# Patient Record
Sex: Female | Born: 1950 | Race: Black or African American | Hispanic: No | Smoking: Never smoker
Health system: Southern US, Community
[De-identification: ages and names within clinical notes are randomized; demographics above are authoritative.]

## PROBLEM LIST (undated history)

## (undated) ENCOUNTER — Emergency Department (HOSPITAL_COMMUNITY): Admission: EM | Payer: Medicare PPO | Source: Home / Self Care

## (undated) ENCOUNTER — Emergency Department (HOSPITAL_BASED_OUTPATIENT_CLINIC_OR_DEPARTMENT_OTHER): Discharge status: Absent without leave | Payer: Medicare PPO

## (undated) DIAGNOSIS — Z923 Personal history of irradiation: Secondary | ICD-10-CM

## (undated) DIAGNOSIS — D649 Anemia, unspecified: Secondary | ICD-10-CM

## (undated) HISTORY — PX: ABDOMINAL HYSTERECTOMY: SHX81

## (undated) HISTORY — PX: WISDOM TOOTH EXTRACTION: SHX21

## (undated) HISTORY — DX: Personal history of irradiation: Z92.3

---

## 2021-01-07 ENCOUNTER — Ambulatory Visit
Admission: RE | Admit: 2021-01-07 | Discharge: 2021-01-07 | Disposition: A | Discharge status: Home / Self Care | Payer: Self-pay | Source: Ambulatory Visit | Attending: Gynecologic Oncology | Admitting: Gynecologic Oncology

## 2021-01-07 ENCOUNTER — Other Ambulatory Visit: Payer: Self-pay

## 2021-01-07 ENCOUNTER — Telehealth: Payer: Self-pay | Admitting: *Deleted

## 2021-01-07 DIAGNOSIS — R1084 Generalized abdominal pain: Secondary | ICD-10-CM

## 2021-01-07 NOTE — Telephone Encounter (Signed)
Called and left the patient a message to call the office back. Patient needs to be scheduled for a new patient appt  °

## 2021-01-08 NOTE — Telephone Encounter (Signed)
Attempted to reach the patient to schedule a new patient appt. Left the patient a message to call the office back.

## 2021-01-08 NOTE — Telephone Encounter (Signed)
Patient called the office back and schedule new patient appt for 7/7 at 10:30 am with Dr Denman George. Patient given an arrive time of 10 am. Send patient an email with appt information

## 2021-01-13 ENCOUNTER — Encounter: Payer: Self-pay | Admitting: Gynecologic Oncology

## 2021-01-14 ENCOUNTER — Other Ambulatory Visit: Payer: Self-pay

## 2021-01-14 ENCOUNTER — Inpatient Hospital Stay (HOSPITAL_BASED_OUTPATIENT_CLINIC_OR_DEPARTMENT_OTHER): Payer: Medicare Other | Admitting: Gynecologic Oncology

## 2021-01-14 ENCOUNTER — Inpatient Hospital Stay: Payer: Medicare Other | Attending: Gynecologic Oncology | Admitting: Gynecologic Oncology

## 2021-01-14 ENCOUNTER — Encounter: Payer: Self-pay | Admitting: Gynecologic Oncology

## 2021-01-14 VITALS — BP 119/62 | HR 76 | Temp 98.8°F | Resp 20 | Ht 62.0 in | Wt 137.0 lb

## 2021-01-14 DIAGNOSIS — K862 Cyst of pancreas: Secondary | ICD-10-CM

## 2021-01-14 DIAGNOSIS — N950 Postmenopausal bleeding: Secondary | ICD-10-CM

## 2021-01-14 DIAGNOSIS — D259 Leiomyoma of uterus, unspecified: Secondary | ICD-10-CM | POA: Insufficient documentation

## 2021-01-14 DIAGNOSIS — D251 Intramural leiomyoma of uterus: Secondary | ICD-10-CM

## 2021-01-14 DIAGNOSIS — K8689 Other specified diseases of pancreas: Secondary | ICD-10-CM

## 2021-01-14 DIAGNOSIS — D390 Neoplasm of uncertain behavior of uterus: Secondary | ICD-10-CM

## 2021-01-14 DIAGNOSIS — N858 Other specified noninflammatory disorders of uterus: Secondary | ICD-10-CM

## 2021-01-14 MED ORDER — SENNOSIDES-DOCUSATE SODIUM 8.6-50 MG PO TABS: 2.0000 | ORAL_TABLET | Freq: Every day | ORAL | 0 refills | Status: DC

## 2021-01-14 MED ORDER — TRAMADOL HCL 50 MG PO TABS: 50.0000 mg | ORAL_TABLET | Freq: Four times a day (QID) | ORAL | 0 refills | Status: DC | PRN

## 2021-01-14 MED ORDER — IBUPROFEN 600 MG PO TABS
600.0000 mg | ORAL_TABLET | Freq: Four times a day (QID) | ORAL | 0 refills | Status: DC | PRN
Start: 2021-01-14 — End: 2021-03-05

## 2021-01-14 NOTE — Progress Notes (Signed)
Consult Note: Gyn-Onc  Consult was requested by Dr. Evie Lacks for the evaluation of Candice Parks 70 y.o. female  CC:  Chief Complaint  Patient presents with   Uterine mass    Assessment/Plan:  Ms. Candice Parks  is a 70 y.o.  year old with clinical stage II endometrial cancer.   We will follow-up today's biopsy. If malignancy, recommend TAH, BSO, lymphadenectomy. There is no apparent cervical stromal/parametrial extension, though the posterior fibroids seem incarcerated and this will likely be a very challenging hysterectomy with anticipated high EBL. She will be type and crossed for 2 units of PRBC.  I explained risks of the procedure to the patient including  bleeding, infection, damage to internal organs (such as bladder,ureters, bowels), blood clot, reoperation and rehospitalization. I explained that she will be in hospital 2 nights postoperatively. I explained that she will need Lovenox postop for DVT prophylaxis.   She is retired and does not require time off of work.  I explained that for locally advanced endometrial cancer, surgery is rarely enough and she will likely require postoperative adjuvant therapies such as radiation, and possibly chemotherapy.   HPI: Ms Candice Parks is a 70 year old P0 who was seen in consultation at the request of Dr Evie Lacks for evaluation of a fibroid uterus with thickened endometrium and postmenopausal bleeding.  The patient reported at least 1 year of postmenopausal bleeding.  She does not have a primary care provider or gynecologist.  She had known fibroids and had noted that she needed a hysterectomy but had not gotten around to seeking care for this.  She presented to the [facility] emergency department on January 05, 2021 with new onset right lower quadrant pain and this prompted a CT scan of the abdomen and pelvis to be performed on 01/05/2021.  This revealed bilateral hydronephrosis related to extrinsic compression on the ureters by an enlarged  uterus.  There were multiple calcified fibroid seen and decreased attenuation within the uterus highly suspicious for endometrial cancer.  There is a hypodensity in the pancreas at the junction of the head and body with a recommendation for MRI for further evaluation.  The uterus measured 19 x 11 x 12 cm.  No lymphadenopathy was identified.  No carcinomatosis was seen.  Dr. Evie Lacks was contacted with these findings and the recommendation was made to consult GYN oncology.  Her medical history is most significant for an absence of formalized medical care.  She denied carrying medical diagnoses such as hypertension or diabetes.  Her surgical history is most significant for having had no prior surgeries.  Her gynecologic history is remarkable for being a P0 by choice.  Her family cancer history is unremarkable for malignancies.  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 She lives alone.   Current Meds:  Outpatient Encounter Medications as of 01/14/2021  Medication Sig   ferrous sulfate 325 (65 FE) MG tablet Take 1 tablet by mouth daily.   ibuprofen (ADVIL) 200 MG tablet Take 200 mg by mouth every 6 (six) hours as needed (Takes 2 tablets as needed).   magnesium hydroxide (MILK OF MAGNESIA) 400 MG/5ML suspension Take by mouth daily as needed for mild constipation.   No facility-administered encounter medications on file as of 01/14/2021.    Allergy: Not on File  Social Hx:   Social History   Socioeconomic History   Marital status: Divorced    Spouse name: Not on file   Number of children: Not  on file   Years of education: Not on file   Highest education level: Not on file  Occupational History   Not on file  Tobacco Use   Smoking status: Never   Smokeless tobacco: Never  Vaping Use   Vaping Use: Never used  Substance and Sexual Activity   Alcohol use: Yes    Comment: Occasional   Drug use: Never   Sexual activity: Not Currently  Other Topics Concern   Not  on file  Social History Narrative   Not on file   Social Determinants of Health   Financial Resource Strain: Not on file  Food Insecurity: Not on file  Transportation Needs: Not on file  Physical Activity: Not on file  Stress: Not on file  Social Connections: Not on file  Intimate Partner Violence: Not on file    Past Surgical Hx: History reviewed. No pertinent surgical history.  Past Medical Hx: History reviewed. No pertinent past medical history.  Past Gynecological History: See HPI, known uterine fibroids No LMP recorded.  Family Hx:  Family History  Problem Relation Age of Onset   Ovarian cancer Sister    Colon cancer Neg Hx    Breast cancer Neg Hx    Endometrial cancer Neg Hx    Pancreatic cancer Neg Hx    Prostate cancer Neg Hx     Review of Systems:  Constitutional  Feels well,    ENT Normal appearing ears and nares bilaterally Skin/Breast  No rash, sores, jaundice, itching, dryness Cardiovascular  No chest pain, shortness of breath, or edema  Pulmonary  No cough or wheeze.  Gastro Intestinal  No nausea, vomitting, or diarrhoea. No bright red blood per rectum, no abdominal pain, change in bowel movement, or constipation.  Genito Urinary  No frequency, urgency, dysuria, + postmenopausal bleeding, fibroid bulk symptoms Musculo Skeletal  No myalgia, arthralgia, joint swelling or pain  Neurologic  No weakness, numbness, change in gait,  Psychology  No depression, anxiety, insomnia.   Vitals:  Blood pressure 119/62, pulse 76, temperature 98.8 F (37.1 C), temperature source Tympanic, resp. rate 20, height 5\' 2"  (1.575 m), weight 137 lb (62.1 kg), SpO2 100 %.  Physical Exam: WD in NAD Neck  Supple NROM, without any enlargements.  Lymph Node Survey No cervical supraclavicular or inguinal adenopathy Cardiovascular  Pulse normal rate, regularity and rhythm. S1 and S2 normal.  Lungs  Clear to auscultation bilateraly, without wheezes/crackles/rhonchi.  Good air movement.  Skin  No rash/lesions/breakdown  Psychiatry  Alert and oriented to person, place, and time  Abdomen  Normoactive bowel sounds, abdomen soft, non-tender and nonobese (BMI 25) without evidence of hernia. Fibroid uterus palpable in lower abdomen extending to umbilicus. Nontender, minimally mobile. Back No CVA tenderness Genito Urinary  Vulva/vagina: Normal external female genitalia.  No lesions. No discharge or bleeding.  Bladder/urethra:  No lesions or masses, well supported bladder  Vagina: grossly normal and free of lesions  Cervix: Displaced anteriorally and pushed towards pelvic floor, the cervical os was slightly open with some friable papillary tissue coming from the endocervix which was sampled see below.  The cervix was palpably normal in its consistency and not obviously replaced by tumor.  There was no palpable parametrial involvement  Uterus: Bulky large immobile uterus filling posterior cul-de-sac with a calcified fibroid in mobile.  The uterus extended to at least the level of the umbilicus.  It was extensively involving sidewall to sidewall space in the pelvis.    Adnexa: no palpable  discrete masses other than uterus. Rectal  Good tone, no masses no cul de sac nodularity. Hemorrhoids. Large calcified fibroid uterus present. Extremities  No bilateral cyanosis, clubbing or edema.  60 minutes of total time was spent for this patient encounter, including preparation, face-to-face counseling with the patient and coordination of care, review of imaging (results and images), communication with the referring provider and documentation of the encounter.   Thereasa Solo, MD  01/14/2021, 11:00 AM

## 2021-01-14 NOTE — Addendum Note (Signed)
Addended by: Everitt Amber C on: 01/14/2021 11:15 AM   Modules accepted: Orders

## 2021-01-14 NOTE — Addendum Note (Signed)
Addended by: Joylene John D on: 01/14/2021 11:31 AM   Modules accepted: Orders

## 2021-01-14 NOTE — Patient Instructions (Addendum)
Plan to have an MRI of the abdomen. Nothing to eat or drink 4 hours before your MRI.  You will most likely be sent home from the hospital with once daily lovenox injections to prevent blood clots for a total of 28 days after surgery. They will instruct you in the hospital on how to administer the injections.  Preparing for your Surgery  Plan for surgery on February 02, 2021 with Dr. Everitt Amber at [place] [facility] will be scheduled for a exploratory laparotomy (larger incision on your abdomen), total abdominal hysterectomy (removal of the uterus and cervix), bilateral salpingo-oophorectomy (removal of both ovaries and fallopian tubes), possible lymphadenectomy (removal of lymph nodes).   Pre-operative Testing -You will receive a phone call from presurgical testing at [facility] to arrange for a pre-operative appointment and lab work.  -Bring your insurance card, copy of an advanced directive if applicable, medication list  -At that visit, you will be asked to sign a consent for a possible blood transfusion in case a transfusion becomes necessary during surgery.  The need for a blood transfusion is rare but having consent is a necessary part of your care.     -You should not be taking blood thinners or aspirin at least ten days prior to surgery unless instructed by your surgeon.  -Do not take supplements such as fish oil (omega 3), red yeast rice, turmeric before your surgery. You want to avoid medications with aspirin in them including headache powders such as BC or Goody's), Excedrin migraine.  Day Before Surgery at [facility] will be asked to take in a light diet the day before surgery. You will be advised you can have clear liquids up until 3 hours before your surgery.    Eat a light diet the day before surgery.  Examples including soups, broths, toast, yogurt, mashed potatoes.  AVOID GAS PRODUCING FOODS. Things to avoid include carbonated beverages (fizzy beverages, sodas), raw  fruits and raw vegetables (uncooked), or beans.   If your bowels are filled with gas, your surgeon will have difficulty visualizing your pelvic organs which increases your surgical risks.  Your role in recovery Your role is to become active as soon as directed by your doctor, while still giving yourself time to heal.  Rest when you feel tired. You will be asked to do the following in order to speed your recovery:  - Cough and breathe deeply. This helps to clear and expand your lungs and can prevent pneumonia after surgery.  - [place]. Do mild physical activity. Walking or moving your legs help your circulation and body functions return to normal. Do not try to get up or walk alone the first time after surgery.   -If you develop swelling on one leg or the other, pain in the back of your leg, redness/warmth in one of your legs, please call the office or go to the Emergency Room to have a doppler to rule out a blood clot. For shortness of breath, chest pain-seek care in the Emergency Room as soon as possible. - Actively manage your pain. Managing your pain lets you move in comfort. We will ask you to rate your pain on a scale of zero to 10. It is your responsibility to tell your doctor or nurse where and how much you hurt so your pain can be treated.  Special Considerations -If you are diabetic, you may be placed on insulin after surgery to have closer control over  your blood sugars to promote healing and recovery.  This does not mean that you will be discharged on insulin.  If applicable, your oral antidiabetics will be resumed when you are tolerating a solid diet.  -Your final pathology results from surgery should be available around one week after surgery and the results will be relayed to you when available.  -Dr. Lahoma Crocker is the surgeon that assists your GYN Oncologist with surgery.  If you end up staying the night, the next day after your surgery you will either  see Dr. Denman George, Dr. Berline Lopes, or Dr. Lahoma Crocker.  -FMLA forms can be faxed to [phone] and please allow 5-7 business days for completion.  Pain Management After Surgery -You have been prescribed your pain medication and bowel regimen medications before surgery so that you can have these available when you are discharged from the hospital.   -Make sure that you have Tylenol and Ibuprofen at home to use on a regular basis after surgery for pain control. We recommend alternating the medications every hour to six hours since they work differently and are processed in the body differently for pain relief.  -Review the attached handout on narcotic use and their risks and side effects.   Bowel Regimen -You have been prescribed Sennakot-S to take nightly to prevent constipation especially if you are taking the narcotic pain medication intermittently.  It is important to prevent constipation and drink adequate amounts of liquids. You can stop taking this medication when you are not taking pain medication and you are back on your normal bowel routine.  Risks of Surgery Risks of surgery are low but include bleeding, infection, damage to surrounding structures, re-operation, blood clots, and very rarely death.   Blood Transfusion Information (For the consent to be signed before surgery)  We will be checking your blood type before surgery so in case of emergencies, we will know what type of blood you would need.                                            WHAT IS A BLOOD TRANSFUSION?  A transfusion is the replacement of blood or some of its parts. Blood is made up of multiple cells which provide different functions. Red blood cells carry oxygen and are used for blood loss replacement. White blood cells fight against infection. Platelets control bleeding. Plasma helps clot blood. Other blood products are available for specialized needs, such as hemophilia or other clotting disorders. BEFORE  THE TRANSFUSION  Who gives blood for transfusions?  You may be able to donate blood to be used at a later date on yourself (autologous donation). Relatives can be asked to donate blood. This is generally not any safer than if you have received blood from a stranger. The same precautions are taken to ensure safety when a relative's blood is donated. Healthy volunteers who are fully evaluated to make sure their blood is safe. This is blood bank blood. Transfusion therapy is the safest it has ever been in the practice of medicine. Before blood is taken from a donor, a complete history is taken to make sure that person has no history of diseases nor engages in risky social behavior (examples are intravenous drug use or sexual activity with multiple partners). The donor's travel history is screened to minimize risk of transmitting infections, such as malaria. The donated blood is  tested for signs of infectious diseases, such as HIV and hepatitis. The blood is then tested to be sure it is compatible with you in order to minimize the chance of a transfusion reaction. If you or a relative donates blood, this is often done in anticipation of surgery and is not appropriate for emergency situations. It takes many days to process the donated blood. RISKS AND COMPLICATIONS Although transfusion therapy is very safe and saves many lives, the main dangers of transfusion include:  Getting an infectious disease. Developing a transfusion reaction. This is an allergic reaction to something in the blood you were given. Every precaution is taken to prevent this. The decision to have a blood transfusion has been considered carefully by your caregiver before blood is given. Blood is not given unless the benefits outweigh the risks.  AFTER SURGERY INSTRUCTIONS  Return to work: 4 weeks if applicable  You will have a white honeycomb dressing over your larger incision. This dressing can be removed 5 days after surgery and you  do not need to reapply a new dressing. Once you remove the dressing, you will notice that you have the surgical glue (dermabond) on the incision and this will peel off on its own. You can get this dressing wet in the shower the days after surgery prior to removal on the 5th day.   Activity: 1. Be up and out of the bed during the day.  Take a nap if needed.  You may walk up steps but be careful and use the hand rail.  Stair climbing will tire you more than you think, you may need to stop part way and rest.   2. No lifting or straining for 6 weeks over 10 pounds. No pushing, pulling, straining for 6 weeks.  3. No driving for 2 week(s).  Do not drive if you are taking narcotic pain medicine and make sure that your reaction time has returned.   4. You can shower as soon as the next day after surgery. Shower daily.  Use your regular soap and water (not directly on the incision) and pat your incision(s) dry afterwards; don't rub.  No tub baths or submerging your body in water until cleared by your surgeon. If you have the soap that was given to you by pre-surgical testing that was used before surgery, you do not need to use it afterwards because this can irritate your incisions.   5. No sexual activity and nothing in the vagina for 6-8 weeks.  6. You may experience a small amount of clear drainage from your incision(s), which is normal.  If the drainage persists, increases, or changes color please call the office.  7. Do not use creams, lotions, or ointments such as neosporin on your incision after surgery until advised by your surgeon because they can cause removal of the dermabond glue on your incisions.    8. You may experience vaginal spotting after surgery or around the 6-8 week mark from surgery when the stitches at the top of the vagina begin to dissolve.  The spotting is normal but if you experience heavy bleeding, call our office.  9. Take Tylenol or ibuprofen first for pain and only use  narcotic pain medication for severe pain not relieved by the Tylenol or Ibuprofen.  Monitor your Tylenol intake to a max of 4,000 mg in a 24 hour period. You can alternate these medications after surgery.  Diet: 1. Low sodium Heart Healthy Diet is recommended but you are cleared  to resume your normal (before surgery) diet after your procedure.  2. It is safe to use a laxative, such as Miralax or Colace, if you have difficulty moving your bowels. You have been prescribed Sennakot at bedtime every evening to keep bowel movements regular and to prevent constipation.    Wound Care: 1. Keep clean and dry.  Shower daily.  Reasons to call the Doctor: Fever - Oral temperature greater than 100.4 degrees Fahrenheit Foul-smelling vaginal discharge Difficulty urinating Nausea and vomiting Increased pain at the site of the incision that is unrelieved with pain medicine. Difficulty breathing with or without chest pain New calf pain especially if only on one side Sudden, continuing increased vaginal bleeding with or without clots.   Contacts: For questions or concerns you should contact:  Dr. Everitt Amber at [phone]  Joylene John, NP at [phone]  After Hours: call [phone] and have the GYN Oncologist paged/contacted (after 5 pm or on the weekends).  Messages sent via mychart are for non-urgent matters and are not responded to after hours so for urgent needs, please call the after hours number.

## 2021-01-15 ENCOUNTER — Other Ambulatory Visit: Payer: Self-pay | Admitting: Gynecologic Oncology

## 2021-01-15 DIAGNOSIS — N858 Other specified noninflammatory disorders of uterus: Secondary | ICD-10-CM

## 2021-01-15 DIAGNOSIS — D251 Intramural leiomyoma of uterus: Secondary | ICD-10-CM

## 2021-01-15 NOTE — Patient Instructions (Signed)
Plan to have an MRI of the abdomen. Nothing to eat or drink 4 hours before your MRI.   You will most likely be sent home from the hospital with once daily lovenox injections to prevent blood clots for a total of 28 days after surgery. They will instruct you in the hospital on how to administer the injections.   Preparing for your Surgery   Plan for surgery on February 02, 2021 with Dr. Everitt Amber at [facility] will be scheduled for a exploratory laparotomy (larger incision on your abdomen), total abdominal hysterectomy (removal of the uterus and cervix), bilateral salpingo-oophorectomy (removal of both ovaries and fallopian tubes), possible lymphadenectomy (removal of lymph nodes).   Pre-operative Testing -You will receive a phone call from presurgical testing at [facility] to arrange for a pre-operative appointment and lab work.   -Bring your insurance card, copy of an advanced directive if applicable, medication list   -At that visit, you will be asked to sign a consent for a possible blood transfusion in case a transfusion becomes necessary during surgery.  The need for a blood transfusion is rare but having consent is a necessary part of your care.      -You should not be taking blood thinners or aspirin at least ten days prior to surgery unless instructed by your surgeon.   -Do not take supplements such as fish oil (omega 3), red yeast rice, turmeric before your surgery. You want to avoid medications with aspirin in them including headache powders such as BC or Goody's), Excedrin migraine.   Day Before Surgery at [place] will be asked to take in a light diet the day before surgery. You will be advised you can have clear liquids up until 3 hours before your surgery.     Eat a light diet the day before surgery.  Examples including soups, broths, toast, yogurt, mashed potatoes.  AVOID GAS PRODUCING FOODS. Things to avoid include carbonated beverages (fizzy beverages,  sodas), raw fruits and raw vegetables (uncooked), or beans.   If your bowels are filled with gas, your surgeon will have difficulty visualizing your pelvic organs which increases your surgical risks.   Your role in recovery Your role is to become active as soon as directed by your doctor, while still giving yourself time to heal.  Rest when you feel tired. You will be asked to do the following in order to speed your recovery:   - Cough and breathe deeply. This helps to clear and expand your lungs and can prevent pneumonia after surgery. - Kay. Do mild physical activity. Walking or moving your legs help your circulation and body functions return to normal. Do not try to get up or walk alone the first time after surgery.   -If you develop swelling on one leg or the other, pain in the back of your leg, redness/warmth in one of your legs, please call the office or go to the Emergency Room to have a doppler to rule out a blood clot. For shortness of breath, chest pain-seek care in the Emergency Room as soon as possible. - Actively manage your pain. Managing your pain lets you move in comfort. We will ask you to rate your pain on a scale of zero to 10. It is your responsibility to tell your doctor or nurse where and how much you hurt so your pain can be treated.   Special Considerations -If you are diabetic, you may  be placed on insulin after surgery to have closer control over your blood sugars to promote healing and recovery.  This does not mean that you will be discharged on insulin.  If applicable, your oral antidiabetics will be resumed when you are tolerating a solid diet.   -Your final pathology results from surgery should be available around one week after surgery and the results will be relayed to you when available.   -Dr. Lahoma Crocker is the surgeon that assists your GYN Oncologist with surgery.  If you end up staying the night, the next day after your surgery  you will either see Dr. Denman George, Dr. Berline Lopes, or Dr. Lahoma Crocker.   -FMLA forms can be faxed to [phone] and please allow 5-7 business days for completion.   Pain Management After Surgery -You have been prescribed your pain medication and bowel regimen medications before surgery so that you can have these available when you are discharged from the hospital.   -Make sure that you have Tylenol and Ibuprofen at home to use on a regular basis after surgery for pain control. We recommend alternating the medications every hour to six hours since they work differently and are processed in the body differently for pain relief.   -Review the attached handout on narcotic use and their risks and side effects.   Bowel Regimen -You have been prescribed Sennakot-S to take nightly to prevent constipation especially if you are taking the narcotic pain medication intermittently.  It is important to prevent constipation and drink adequate amounts of liquids. You can stop taking this medication when you are not taking pain medication and you are back on your normal bowel routine.   Risks of Surgery Risks of surgery are low but include bleeding, infection, damage to surrounding structures, re-operation, blood clots, and very rarely death.     Blood Transfusion Information (For the consent to be signed before surgery)   We will be checking your blood type before surgery so in case of emergencies, we will know what type of blood you would need.                                             WHAT IS A BLOOD TRANSFUSION?   A transfusion is the replacement of blood or some of its parts. Blood is made up of multiple cells which provide different functions. Red blood cells carry oxygen and are used for blood loss replacement. White blood cells fight against infection. Platelets control bleeding. Plasma helps clot blood. Other blood products are available for specialized needs, such as hemophilia or other  clotting disorders. BEFORE THE TRANSFUSION Who gives blood for transfusions? You may be able to donate blood to be used at a later date on yourself (autologous donation). Relatives can be asked to donate blood. This is generally not any safer than if you have received blood from a stranger. The same precautions are taken to ensure safety when a relative's blood is donated. Healthy volunteers who are fully evaluated to make sure their blood is safe. This is blood bank blood. Transfusion therapy is the safest it has ever been in the practice of medicine. Before blood is taken from a donor, a complete history is taken to make sure that person has no history of diseases nor engages in risky social behavior (examples are intravenous drug use or sexual activity with multiple  partners). The donor's travel history is screened to minimize risk of transmitting infections, such as malaria. The donated blood is tested for signs of infectious diseases, such as HIV and hepatitis. The blood is then tested to be sure it is compatible with you in order to minimize the chance of a transfusion reaction. If you or a relative donates blood, this is often done in anticipation of surgery and is not appropriate for emergency situations. It takes many days to process the donated blood. RISKS AND COMPLICATIONS Although transfusion therapy is very safe and saves many lives, the main dangers of transfusion include: Getting an infectious disease. Developing a transfusion reaction. This is an allergic reaction to something in the blood you were given. Every precaution is taken to prevent this. The decision to have a blood transfusion has been considered carefully by your caregiver before blood is given. Blood is not given unless the benefits outweigh the risks.   AFTER SURGERY INSTRUCTIONS   Return to work: 4 weeks if applicable   You will have a white honeycomb dressing over your larger incision. This dressing can be removed 5  days after surgery and you do not need to reapply a new dressing. Once you remove the dressing, you will notice that you have the surgical glue (dermabond) on the incision and this will peel off on its own. You can get this dressing wet in the shower the days after surgery prior to removal on the 5th day.    Activity: 1. Be up and out of the bed during the day.  Take a nap if needed.  You may walk up steps but be careful and use the hand rail.  Stair climbing will tire you more than you think, you may need to stop part way and rest.   2. No lifting or straining for 6 weeks over 10 pounds. No pushing, pulling, straining for 6 weeks.   3. No driving for 2 week(s).  Do not drive if you are taking narcotic pain medicine and make sure that your reaction time has returned.   4. You can shower as soon as the next day after surgery. Shower daily.  Use your regular soap and water (not directly on the incision) and pat your incision(s) dry afterwards; don't rub.  No tub baths or submerging your body in water until cleared by your surgeon. If you have the soap that was given to you by pre-surgical testing that was used before surgery, you do not need to use it afterwards because this can irritate your incisions.   5. No sexual activity and nothing in the vagina for 6-8 weeks.   6. You may experience a small amount of clear drainage from your incision(s), which is normal.  If the drainage persists, increases, or changes color please call the office.   7. Do not use creams, lotions, or ointments such as neosporin on your incision after surgery until advised by your surgeon because they can cause removal of the dermabond glue on your incisions.     8. You may experience vaginal spotting after surgery or around the 6-8 week mark from surgery when the stitches at the top of the vagina begin to dissolve.  The spotting is normal but if you experience heavy bleeding, call our office.   9. Take Tylenol or ibuprofen  first for pain and only use narcotic pain medication for severe pain not relieved by the Tylenol or Ibuprofen.  Monitor your Tylenol intake to a max of  4,000 mg in a 24 hour period. You can alternate these medications after surgery.   Diet: 1. Low sodium Heart Healthy Diet is recommended but you are cleared to resume your normal (before surgery) diet after your procedure.   2. It is safe to use a laxative, such as Miralax or Colace, if you have difficulty moving your bowels. You have been prescribed Sennakot at bedtime every evening to keep bowel movements regular and to prevent constipation.     Wound Care: 1. Keep clean and dry.  Shower daily.   Reasons to call the Doctor: Fever - Oral temperature greater than 100.4 degrees Fahrenheit Foul-smelling vaginal discharge Difficulty urinating Nausea and vomiting Increased pain at the site of the incision that is unrelieved with pain medicine. Difficulty breathing with or without chest pain New calf pain especially if only on one side Sudden, continuing increased vaginal bleeding with or without clots.   Contacts: For questions or concerns you should contact:   Dr. Everitt Amber at [phone]   Joylene John, NP at [phone]   After Hours: call [phone] and have the GYN Oncologist paged/contacted (after 5 pm or on the weekends).   Messages sent via mychart are for non-urgent matters and are not responded to after hours so for urgent needs, please call the after hours number.

## 2021-01-15 NOTE — Progress Notes (Signed)
Patient here for consultation with Dr. Everitt Amber and for a pre-operative discussion prior to her scheduled surgery on February 02, 2021. She is scheduled for an exploratory laparotomy (larger incision on your abdomen), total abdominal hysterectomy (removal of the uterus and cervix), bilateral salpingo-oophorectomy (removal of both ovaries and fallopian tubes), possible lymphadenectomy (removal of lymph nodes). The surgery was discussed in detail.  See after visit summary for additional details. Visual aids used to discuss items related to surgery including the incentive spirometer, sequential compression stockings, foley catheter, IV pump, multi-modal pain regimen including tylenol, photo of the surgical robot, female reproductive system to discuss surgery in detail.      Discussed post-op pain management in detail including the aspects of the enhanced recovery pathway.  Advised her that a new prescription would be sent in for tramadol and it is only to be used for after her upcoming surgery.  We discussed the use of tylenol post-op and to monitor for a maximum of 4,000 mg in a 24 hour period.  Also prescribed sennakot to be used after surgery and to hold if having loose stools.  Discussed bowel regimen in detail.     Discussed the use of lovenox pre-op, SCDs, and measures to take at home to prevent DVT including frequent mobility.  Reportable signs and symptoms of DVT discussed. Post-operative instructions discussed and expectations for after surgery. Incisional care discussed as well including reportable signs and symptoms including erythema, drainage, wound separation.   Instructions for upcoming MRI discussed. Also reviewed administration of lovenox injections post-operatively.     10 minutes spent with the patient.  Verbalizing understanding of material discussed. No needs or concerns voiced at the end of the visit.   Advised patient to call for any needs.  Advised that her post-operative medications had  been prescribed and could be picked up at any time.     This appointment is included in the surgical global fee as pre-operative testing and has no charge.

## 2021-01-19 ENCOUNTER — Telehealth: Payer: Self-pay

## 2021-01-19 LAB — SURGICAL PATHOLOGY

## 2021-01-19 NOTE — Telephone Encounter (Signed)
Told Ms Greb that biopsy came back as endometrial cancer which was suspected.  Continue with surgery as planned per Melissa Cross,NP. Pt verbalized understanding.

## 2021-01-21 ENCOUNTER — Telehealth: Payer: Self-pay

## 2021-01-21 ENCOUNTER — Other Ambulatory Visit: Payer: Self-pay | Admitting: Gynecologic Oncology

## 2021-01-21 DIAGNOSIS — D251 Intramural leiomyoma of uterus: Secondary | ICD-10-CM

## 2021-01-21 DIAGNOSIS — D500 Iron deficiency anemia secondary to blood loss (chronic): Secondary | ICD-10-CM

## 2021-01-21 MED ORDER — FERROUS GLUCONATE 324 (38 FE) MG PO TABS: 324.0000 mg | ORAL_TABLET | Freq: Three times a day (TID) | ORAL | 3 refills | Status: DC

## 2021-01-21 NOTE — Telephone Encounter (Signed)
Told  Candice Parks that Dr. Denman George wants her to take ferrous gluconate  instead of ferrous sulfate.  A prescription was sent to her pharmacy.  She is to take 1 tablet 3 times a day with meals.   She is currently taking MOM daily for her bowels with the once daily ferrous sulfate and moving bowels fine. Told her to increase MOM to bid and or take a Capful of Miralax BID as she is increasing the dose of iron and this will increase the chance of constipation.  She needs to pick up the iron tablets today to start taking this preparation prior to surgery. She will also need IV iron prior to surgery since her hemoglobin being to low.  Someone from [facility] will be calling her to set up this appointment. Pt verbalized understanding.

## 2021-01-21 NOTE — Progress Notes (Signed)
Per Dr. Denman George, plan for patient to take ferrous gluconate three times daily pre-operatively.

## 2021-01-22 ENCOUNTER — Telehealth: Payer: Self-pay | Admitting: Hematology and Oncology

## 2021-01-22 NOTE — Telephone Encounter (Signed)
Received a new hem referral for Candice Parks to be seen in the [facility]. Ms. Gallo has been cld and scheduled to see Dr. Lorenso Courier on 7/18 at 11:20am.

## 2021-01-25 ENCOUNTER — Other Ambulatory Visit: Payer: Self-pay | Admitting: Gynecologic Oncology

## 2021-01-25 ENCOUNTER — Encounter: Payer: Medicare Other | Admitting: Hematology and Oncology

## 2021-01-25 ENCOUNTER — Other Ambulatory Visit: Payer: Medicare Other

## 2021-01-25 ENCOUNTER — Ambulatory Visit (HOSPITAL_COMMUNITY): Admission: RE | Admit: 2021-01-25 | Payer: Medicare Other | Source: Ambulatory Visit

## 2021-01-25 ENCOUNTER — Telehealth: Payer: Self-pay | Admitting: Hematology and Oncology

## 2021-01-25 DIAGNOSIS — K862 Cyst of pancreas: Secondary | ICD-10-CM

## 2021-01-25 NOTE — Telephone Encounter (Signed)
Pt has been cld rescheduled to see Dr. Lorenso Courier on 7/20 a 840am.

## 2021-01-25 NOTE — Patient Instructions (Addendum)
DUE TO COVID-19 ONLY ONE VISITOR IS ALLOWED TO COME WITH YOU AND STAY IN THE WAITING ROOM ONLY DURING PRE OP AND PROCEDURE DAY OF SURGERY. THE 2 VISITORS  MAY VISIT WITH YOU AFTER SURGERY IN YOUR PRIVATE ROOM DURING VISITING HOURS ONLY!  YOU NEED TO HAVE A COVID 19 TEST ON__7/22_____ @_2 :30 PM______, THIS TEST MUST BE DONE BEFORE SURGERY,  COVID TESTING [place] [id number], IT IS ON THE RIGHT GOING OUT [address] APPROXIMATELY  2 MINUTES PAST [employer] ON THE RIGHT. ONCE YOUR COVID TEST IS COMPLETED,  PLEASE BEGIN THE QUARANTINE INSTRUCTIONS AS OUTLINED IN YOUR HANDOUT.                [place]     Your procedure is scheduled on: 02/02/21   Report to [facility] Main  Entrance   Report to admitting at   12:15 PM     Call this number if you have problems the morning of surgery [place], NO CHEWING GUM [place].     Take these medicines the morning of surgery with A SIP OF WATER: none                                You may not have any metal on your body including hair pins and              piercings  Do not wear jewelry, make-up, lotions, powders or perfumes, deodorant             Do not wear nail polish on your fingernails.  Do not shave  48 hours prior to surgery.                 Do not bring valuables to the hospital. [place].  Contacts, dentures or bridgework may not be worn into surgery.       Special Instructions: N/A              Please read over the following fact sheets you were given: _____________________________________________________________________ Day before surgery have a light diet            Full Liquid Diet   Strained creamy soups Tea, Coffee- with cream or mild and sugar or honey  Juices- cranberry , grape and apple  Jello  Milkshakes  Pudding , custards  Popsicles   Water Plain ice cream f, frozen yogurt, sherbet, plain yogurt  Fruit ices and popsicles with no fruit pulp  Sugar, honey and syrups Clear broths  Boost, Ensure, Resource and other liquid supplements NO CARBONATED BEVERAGES     Change to a liquid diet after midnight   CLEAR LIQUID DIET  Foods Allowed                                                                     Foods Excluded  Water, Black Coffee and tea, regular and decaf  liquids that you cannot  Plain Jell-O in any flavor  (No red)                                           see through such as: Fruit ices (not with fruit pulp)                                     milk, soups, orange juice  Iced Popsicles (No red)                                    All solid food                                   Apple juices Sports drinks like Gatorade (No red) Lightly seasoned clear broth or consume(fat free) Sugar, honey syrup    Nothing by mouth after  11:00 AM    Before surgery, you can play an important role.  Because skin is not sterile, your skin needs to be as free of germs as possible.  You can reduce the number of germs on your skin by washing with CHG (chlorahexidine gluconate) soap before surgery.  CHG is an antiseptic cleaner which kills germs and bonds with the skin to continue killing germs even after washing. Please DO NOT use if you have an allergy to CHG or antibacterial soaps.  If your skin becomes reddened/irritated stop using the CHG and inform your nurse when you arrive at Short Stay. Do not shave (including legs and underarms) for at least 48 hours prior to the first CHG shower.   Please follow these instructions carefully:  1.  Shower with CHG Soap the night before surgery and the  morning of Surgery.  2.  If you choose to wash your hair, wash your hair first as usual with your  normal  shampoo.  3.  After you shampoo, rinse your hair and body thoroughly to remove the  shampoo.                                         4.  Use CHG as you would any other liquid soap.  You can apply chg directly  to the skin and wash                       Gently with a scrungie or clean washcloth.  5.  Apply the CHG Soap to your body ONLY FROM THE NECK DOWN.   Do not use on face/ open                           Wound or open sores. Avoid contact with eyes, ears mouth and genitals (private parts).                       Wash face,  Genitals (private parts) with your normal soap.             6.  Wash thoroughly, paying special attention  to the area where your surgery  will be performed.  7.  Thoroughly rinse your body with warm water from the neck down.  8.  DO NOT shower/wash with your normal soap after using and rinsing off  the CHG Soap.             9.  Pat yourself dry with a clean towel.            10.  Wear clean pajamas.            11.  Place clean sheets on your bed the night of your first shower and do not  sleep with pets. Day of Surgery : Do not apply any lotions/deodorants the morning of surgery.  Please wear clean clothes to the hospital/surgery center.  FAILURE TO FOLLOW THESE INSTRUCTIONS MAY RESULT IN THE CANCELLATION OF YOUR SURGERY PATIENT SIGNATURE_________________________________  NURSE SIGNATURE__________________________________  WHAT IS A BLOOD TRANSFUSION? Blood Transfusion Information  A transfusion is the replacement of blood or some of its parts. Blood is made up of multiple cells which provide different functions. Red blood cells carry oxygen and are used for blood loss replacement. White blood cells fight against infection. Platelets control bleeding. Plasma helps clot blood. Other blood products are available for specialized needs, such as hemophilia or other clotting disorders. BEFORE THE TRANSFUSION  Who gives blood for transfusions?  Healthy volunteers who are fully evaluated to make sure their blood is safe. This is blood bank blood. Transfusion therapy is the  safest it has ever been in the practice of medicine. Before blood is taken from a donor, a complete history is taken to make sure that person has no history of diseases nor engages in risky social behavior (examples are intravenous drug use or sexual activity with multiple partners). The donor's travel history is screened to minimize risk of transmitting infections, such as malaria. The donated blood is tested for signs of infectious diseases, such as HIV and hepatitis. The blood is then tested to be sure it is compatible with you in order to minimize the chance of a transfusion reaction. If you or a relative donates blood, this is often done in anticipation of surgery and is not appropriate for emergency situations. It takes many days to process the donated blood. RISKS AND COMPLICATIONS Although transfusion therapy is very safe and saves many lives, the main dangers of transfusion include:  Getting an infectious disease. Developing a transfusion reaction. This is an allergic reaction to something in the blood you were given. Every precaution is taken to prevent this. The decision to have a blood transfusion has been considered carefully by your caregiver before blood is given. Blood is not given unless the benefits outweigh the risks. AFTER THE TRANSFUSION Right after receiving a blood transfusion, you will usually feel much better and more energetic. This is especially true if your red blood cells have gotten low (anemic). The transfusion raises the level of the red blood cells which carry oxygen, and this usually causes an energy increase. The nurse administering the transfusion will monitor you carefully for complications. HOME CARE INSTRUCTIONS  No special instructions are needed after a transfusion. You may find your energy is better. Speak with your caregiver about any limitations on activity for underlying diseases you may have. SEEK MEDICAL CARE IF:  Your condition is not improving after your  transfusion. You develop redness or irritation at the intravenous (IV) site. SEEK IMMEDIATE MEDICAL CARE IF:  Any of the following symptoms occur  over the next 12 hours: Shaking chills. You have a temperature by mouth above 102 F (38.9 C), not controlled by medicine. Chest, back, or muscle pain. People around you feel you are not acting correctly or are confused. Shortness of breath or difficulty breathing. Dizziness and fainting. You get a rash or develop hives. You have a decrease in urine output. Your urine turns a dark color or changes to pink, red, or brown. Any of the following symptoms occur over the next 10 days: You have a temperature by mouth above 102 F (38.9 C), not controlled by medicine. Shortness of breath. Weakness after normal activity. The white part of the eye turns yellow (jaundice). You have a decrease in the amount of urine or are urinating less often. Your urine turns a dark color or changes to pink, red, or brown. Document Released: 06/24/2000 Document Revised: 09/19/2011 Document Reviewed: 02/11/2008 [facility] Patient Information 2014 [employer], [place].  _______________________________________________________________________  ________________________________________________________________________

## 2021-01-26 ENCOUNTER — Telehealth: Payer: Self-pay | Admitting: Gynecologic Oncology

## 2021-01-26 ENCOUNTER — Encounter (HOSPITAL_COMMUNITY): Payer: Self-pay | Admitting: Physician Assistant

## 2021-01-26 ENCOUNTER — Telehealth: Payer: Self-pay | Admitting: Hematology and Oncology

## 2021-01-26 ENCOUNTER — Encounter (HOSPITAL_COMMUNITY)
Admission: RE | Admit: 2021-01-26 | Discharge: 2021-01-26 | Disposition: A | Discharge status: Home / Self Care | Payer: Medicare Other | Source: Ambulatory Visit | Attending: Gynecologic Oncology | Admitting: Gynecologic Oncology

## 2021-01-26 ENCOUNTER — Encounter (HOSPITAL_COMMUNITY): Payer: Self-pay

## 2021-01-26 ENCOUNTER — Ambulatory Visit (HOSPITAL_COMMUNITY)
Admission: RE | Admit: 2021-01-26 | Discharge: 2021-01-26 | Disposition: A | Discharge status: Home / Self Care | Payer: Medicare Other | Source: Ambulatory Visit | Attending: Gynecologic Oncology | Admitting: Gynecologic Oncology

## 2021-01-26 ENCOUNTER — Other Ambulatory Visit (HOSPITAL_COMMUNITY): Payer: Medicare Other

## 2021-01-26 ENCOUNTER — Other Ambulatory Visit: Payer: Self-pay

## 2021-01-26 DIAGNOSIS — Z01812 Encounter for preprocedural laboratory examination: Secondary | ICD-10-CM | POA: Insufficient documentation

## 2021-01-26 DIAGNOSIS — K862 Cyst of pancreas: Secondary | ICD-10-CM | POA: Diagnosis present

## 2021-01-26 HISTORY — DX: Anemia, unspecified: D64.9

## 2021-01-26 LAB — CBC
HCT: 22.8 % — ABNORMAL LOW (ref 36.0–46.0)
Hemoglobin: 6.3 g/dL — CL (ref 12.0–15.0)
MCH: 17.7 pg — ABNORMAL LOW (ref 26.0–34.0)
MCHC: 27.6 g/dL — ABNORMAL LOW (ref 30.0–36.0)
MCV: 64.2 fL — ABNORMAL LOW (ref 80.0–100.0)
Platelets: 567 10*3/uL — ABNORMAL HIGH (ref 150–400)
RBC: 3.55 MIL/uL — ABNORMAL LOW (ref 3.87–5.11)
RDW: 19 % — ABNORMAL HIGH (ref 11.5–15.5)
WBC: 9.2 10*3/uL (ref 4.0–10.5)
nRBC: 0 % (ref 0.0–0.2)

## 2021-01-26 LAB — URINALYSIS, ROUTINE W REFLEX MICROSCOPIC
Bacteria, UA: NONE SEEN
Bilirubin Urine: NEGATIVE
Glucose, UA: NEGATIVE mg/dL
Ketones, ur: NEGATIVE mg/dL
Nitrite: NEGATIVE
Protein, ur: NEGATIVE mg/dL
Specific Gravity, Urine: 1.01 (ref 1.005–1.030)
pH: 8 (ref 5.0–8.0)

## 2021-01-26 LAB — BASIC METABOLIC PANEL
Anion gap: 8 (ref 5–15)
BUN: 13 mg/dL (ref 8–23)
CO2: 27 mmol/L (ref 22–32)
Calcium: 8.9 mg/dL (ref 8.9–10.3)
Chloride: 107 mmol/L (ref 98–111)
Creatinine, Ser: 0.84 mg/dL (ref 0.44–1.00)
GFR, Estimated: >60 mL/min (ref >60)
Glucose, Bld: 78 mg/dL (ref 70–99)
Potassium: 3.9 mmol/L (ref 3.5–5.1)
Sodium: 142 mmol/L (ref 135–145)

## 2021-01-26 LAB — PREPARE RBC (CROSSMATCH)

## 2021-01-26 MED ORDER — GADOBUTROL 1 MMOL/ML IV SOLN
6.0000 mL | Freq: Once | INTRAVENOUS | Status: AC | PRN
  Administered 2021-01-26: 6 mL via INTRAVENOUS

## 2021-01-26 NOTE — Telephone Encounter (Signed)
Ms. Sprowl cld to reschedule her hem appt w/Dr. Lorenso Courier to 7/22 at 1120am.

## 2021-01-26 NOTE — Telephone Encounter (Signed)
Informed patient that due to her severe anemia, locally advanced cancer and bulky uterus, I feel that it is most appropriate to proceed with her surgery at [facility] where there is a blood bank in house. I explained that iron infusions this close to surgery will be unlikely to be helpful in minimizing blood transfusions.  She is agreeable to proceeding with surgery at [facility]. I explained that we will contact her with her preop appointment for Friday, and surgery will be Monday July, 25th.  She understands that she will need a preoperative blood transfusion prior to skin incision to optimize preoperative hemoglobin in anticipation of significant blood loss.  Thereasa Solo, MD

## 2021-01-26 NOTE — Progress Notes (Addendum)
COVID Vaccine Completed:Yes Date COVID Vaccine completed:12/16/19 COVID vaccine manufacturer: [place]      PCP - Dr. Ignacia Palma from [facility] in [place] Cardiologist - none  Chest x-ray - no EKG - 01/05/21 [facility] requested Stress Test - no ECHO - no Cardiac Cath - no Pacemaker/ICD device last checked:NA  Sleep Study - no CPAP -   Fasting Blood Sugar - NA Checks Blood Sugar _____ times a day  Blood Thinner Instructions:NA Aspirin Instructions: Last Dose:  Anesthesia review: yes  Patient denies shortness of breath, fever, cough and chest pain at PAT appointment Pt has no SOB with any activities  Patient verbalized understanding of instructions that were given to them at the PAT appointment. Patient was also instructed that they will need to review over the PAT instructions again at home before surgery. yes

## 2021-01-27 ENCOUNTER — Telehealth: Payer: Self-pay | Admitting: Oncology

## 2021-01-27 ENCOUNTER — Other Ambulatory Visit: Payer: Medicare Other

## 2021-01-27 ENCOUNTER — Encounter: Payer: Medicare Other | Admitting: Hematology and Oncology

## 2021-01-27 NOTE — Telephone Encounter (Signed)
Called Candice Parks and advised her that appointments with Dr. Lorenso Courier and labs have been canceled on 01/29/2021 because she will be having surgery at [facility].  She verbalized understanding and said she has a pre op appointment at [facility] on Friday at 2:30.

## 2021-01-28 ENCOUNTER — Encounter: Payer: Medicare Other | Admitting: Hematology and Oncology

## 2021-01-29 ENCOUNTER — Other Ambulatory Visit: Payer: Medicare Other

## 2021-01-29 ENCOUNTER — Other Ambulatory Visit (HOSPITAL_COMMUNITY): Payer: Medicare Other

## 2021-02-02 ENCOUNTER — Inpatient Hospital Stay (HOSPITAL_COMMUNITY): Admission: RE | Admit: 2021-02-02 | Payer: Medicare Other | Source: Home / Self Care | Admitting: Gynecologic Oncology

## 2021-02-02 DIAGNOSIS — N858 Other specified noninflammatory disorders of uterus: Secondary | ICD-10-CM

## 2021-02-02 LAB — TYPE AND SCREEN
ABO/RH(D): O POS
Antibody Screen: NEGATIVE

## 2021-02-02 SURGERY — HYSTERECTOMY, ABDOMINAL, WITH SALPINGO-OOPHORECTOMY
Anesthesia: General

## 2021-02-08 ENCOUNTER — Inpatient Hospital Stay: Payer: Medicare Other | Attending: Gynecologic Oncology | Admitting: Gynecologic Oncology

## 2021-02-08 ENCOUNTER — Telehealth: Payer: Self-pay

## 2021-02-08 ENCOUNTER — Other Ambulatory Visit: Payer: Self-pay | Admitting: Gynecologic Oncology

## 2021-02-08 ENCOUNTER — Inpatient Hospital Stay: Payer: Medicare Other

## 2021-02-08 ENCOUNTER — Other Ambulatory Visit: Payer: Self-pay

## 2021-02-08 VITALS — BP 117/68 | HR 88 | Temp 98.7°F | Resp 18 | Ht 63.0 in | Wt 132.2 lb

## 2021-02-08 DIAGNOSIS — C541 Malignant neoplasm of endometrium: Secondary | ICD-10-CM

## 2021-02-08 DIAGNOSIS — R339 Retention of urine, unspecified: Secondary | ICD-10-CM

## 2021-02-08 DIAGNOSIS — Z90710 Acquired absence of both cervix and uterus: Secondary | ICD-10-CM

## 2021-02-08 DIAGNOSIS — D509 Iron deficiency anemia, unspecified: Secondary | ICD-10-CM | POA: Diagnosis not present

## 2021-02-08 DIAGNOSIS — C55 Malignant neoplasm of uterus, part unspecified: Secondary | ICD-10-CM | POA: Insufficient documentation

## 2021-02-08 DIAGNOSIS — G8918 Other acute postprocedural pain: Secondary | ICD-10-CM

## 2021-02-08 DIAGNOSIS — G609 Hereditary and idiopathic neuropathy, unspecified: Secondary | ICD-10-CM | POA: Diagnosis not present

## 2021-02-08 DIAGNOSIS — Z7952 Long term (current) use of systemic steroids: Secondary | ICD-10-CM | POA: Diagnosis not present

## 2021-02-08 DIAGNOSIS — Z79899 Other long term (current) drug therapy: Secondary | ICD-10-CM | POA: Diagnosis not present

## 2021-02-08 DIAGNOSIS — Z90722 Acquired absence of ovaries, bilateral: Secondary | ICD-10-CM

## 2021-02-08 LAB — URINALYSIS, COMPLETE (UACMP) WITH MICROSCOPIC
Bilirubin Urine: NEGATIVE
Glucose, UA: NEGATIVE mg/dL
Ketones, ur: 5 mg/dL — AB
Nitrite: NEGATIVE
Protein, ur: NEGATIVE mg/dL
Specific Gravity, Urine: 1.014 (ref 1.005–1.030)
WBC, UA: >50 WBC/hpf — ABNORMAL HIGH (ref 0–5)
pH: 7 (ref 5.0–8.0)

## 2021-02-08 MED ORDER — OXYCODONE HCL 5 MG PO TABS: 5.0000 mg | ORAL_TABLET | Freq: Four times a day (QID) | ORAL | 0 refills | Status: DC | PRN

## 2021-02-08 NOTE — Progress Notes (Signed)
GYN Oncology Symptom Management  Candice Parks is a 70 year old female initially seen in consultation at the request of Dr. Evie Lacks for newly diagnosed endometrial cancer. On February 01, 2021 at [facility], she underwent a total abdominal hysterectomy, bilateral salpingo-oophorectomy, left pelvic lymphadenectomy, right para-aortic lymphadenectomy with Dr. Everitt Amber. She was discharged home on 02/05/2021. At the time of discharge, she had been instructed on self catheterization due to urinary retention post-operatively.   She presents to the office today for evaluation of mons swelling and difficulty emptying her bladder. She states she had in and out cathed herself on the first day when she returned home with around 300-400 cc out but did not cath after that time due to difficulty with the procedure and finding the urethra. Since that time, she states she has to void every hour and urinates around 75 to 100 cc of urine. She is able to sleep at night. She denies fever, chills, dysuria, and states she feels she empties her bladder sometimes. No vaginal bleeding or discharge reported. States her bowels are moving well. She is tolerating her diet with no nausea or emesis. No concerns voiced about her abdominal incision. She reports mons swelling after surgery, more on the right side. Denies lower extremity edema bilaterally.  Exam: Alert, oriented x 3, in no acute distress, friend in the room. Lungs clear. Heart regular in rate and rhythm. Abdominal incision healing well with dermabond present with no erythema or drainage. Mild edema noted on the mons pubis, more on the right. No lower extrem edema noted. At 14:05 pm, 80 cc of clear, yellow urine voided. At 14:10 pm, a foley catheter was placed with 400 cc of yellow urine out. Upon emptying of the bladder by the catheter, the urine was cloudy with sediment.   Assessment/Plan: 70 year old female with post-operative urinary retention. The patient has experienced difficulty  with self cathing herself post-operatively. Plan for foley catheter to stay in for one week (per Dr. Denman George) then plan for removal on Tuesday, August 9. Urine sample from the catheter will be sent for analysis and culture. Reportable signs and symptoms reviewed. She is advised to call for any needs. We will contact her with the results of her urine sample from today. She has been instructed on leg bag use, large bag use at night time. Supplies given. A new abdominal binder was given per patient request. Discussed the use of the binder and compression shorts or pants to assist with the mons swelling.

## 2021-02-08 NOTE — Telephone Encounter (Signed)
Received call from [place] after her appointment today requesting a refill for oxycodone. [facility] prescribed oxycodone '5mg'$  when she was discharged last week.

## 2021-02-08 NOTE — Progress Notes (Signed)
See RN note. Patient called after appt requesting a refill on oxycodone for post-op pain.

## 2021-02-08 NOTE — Telephone Encounter (Signed)
Received phone call from [place] stating she needs to be seen in the office. She states she recently had surgery and has been catheterizing herself. She does not feel like she is emptying her bladder and is having swelling above her vagina. Patient has been scheduled to see Joylene John, NP at 2:00 pm today.

## 2021-02-08 NOTE — Patient Instructions (Signed)
Plan to keep the foley catheter in for this week to give your bladder a chance to rest. We will send a urine sample today and let you know if an antibiotic is needed.   We will plan on removing the catheter next week. Please call the office for any questions or concerns or needs. We would recommend using the big bag at night time and the leg bags during the day.

## 2021-02-09 ENCOUNTER — Telehealth: Payer: Self-pay

## 2021-02-09 ENCOUNTER — Inpatient Hospital Stay (HOSPITAL_BASED_OUTPATIENT_CLINIC_OR_DEPARTMENT_OTHER): Payer: Medicare Other | Admitting: Gynecologic Oncology

## 2021-02-09 ENCOUNTER — Other Ambulatory Visit: Payer: Self-pay | Admitting: Gynecologic Oncology

## 2021-02-09 VITALS — BP 110/84 | HR 92 | Temp 97.4°F | Resp 16 | Ht 63.0 in | Wt 132.0 lb

## 2021-02-09 DIAGNOSIS — N390 Urinary tract infection, site not specified: Secondary | ICD-10-CM

## 2021-02-09 DIAGNOSIS — Z466 Encounter for fitting and adjustment of urinary device: Secondary | ICD-10-CM

## 2021-02-09 MED ORDER — NITROFURANTOIN MONOHYD MACRO 100 MG PO CAPS: 100.0000 mg | ORAL_CAPSULE | Freq: Two times a day (BID) | ORAL | 0 refills | Status: DC

## 2021-02-09 NOTE — Patient Instructions (Signed)
Plan to measure your urine and keep the values on the recording sheet. Before you use the catheter, go to the bathroom and attempt to urinate. Write the amount of your on the sheet then proceed to self cath and write down the amount you get out. Eventually, you will be urinating large amounts with little urine left in the bladder when using the catheter. At this point, you can stop using the catheter.   You will probably need to self cath three times a day starting out to make sure you are not retaining too much urine. You can always cath more than three times a day if needed.  Please call the office for any questions or concerns or needs.

## 2021-02-09 NOTE — Progress Notes (Signed)
Based on urine culture results, plan on prescribing Macrobid for 5 days per Dr. Denman George.

## 2021-02-09 NOTE — Telephone Encounter (Signed)
Left message for Candice Parks letting her know we can see her at 11:45 today. Requested a return call to confirm this will work for her.

## 2021-02-09 NOTE — Telephone Encounter (Signed)
Connected with [place] and informed her of her 11:45 appointment to have her catheter removed. Patient is in agreement of this.

## 2021-02-09 NOTE — Telephone Encounter (Signed)
Received phone call from Candice Parks this morning stating she is very uncomfortable with the foley catheter and it has to come out. She states she is unable to have a BM with it in place and it hurts. She says she is having swelling vaginally. Reconfirmed that patient does not have a latex allergy. Explained that if we were to remove her catheter she would need to begin self catheterizing again so her bladder does not get too full. Patient verbalized understanding. Will discuss with Dr. Denman George and reach back out to the patient this morning to follow up.

## 2021-02-09 NOTE — Telephone Encounter (Signed)
Told [name] that the urine culture shows that she has a UTI.  Candice John, NP sent in Macrobid 100 mg to take bid x 5 days. Encouraged [name] to p/u prescription this evening so she can start the ATB since she is having a lot of lower abdominal pressure and discomfort. Pt verbalized understanding.

## 2021-02-10 ENCOUNTER — Telehealth: Payer: Self-pay

## 2021-02-10 ENCOUNTER — Other Ambulatory Visit: Payer: Self-pay | Admitting: Gynecologic Oncology

## 2021-02-10 DIAGNOSIS — T83511A Infection and inflammatory reaction due to indwelling urethral catheter, initial encounter: Secondary | ICD-10-CM

## 2021-02-10 DIAGNOSIS — N390 Urinary tract infection, site not specified: Secondary | ICD-10-CM

## 2021-02-10 LAB — URINE CULTURE: Culture: >=100000 — AB

## 2021-02-10 MED ORDER — CIPROFLOXACIN HCL 250 MG PO TABS: 250.0000 mg | ORAL_TABLET | Freq: Two times a day (BID) | ORAL | 0 refills | Status: DC

## 2021-02-10 NOTE — Progress Notes (Signed)
Plan for changing Macrobid to Cipro given culture results.

## 2021-02-10 NOTE — Telephone Encounter (Signed)
Told Ms March Rummage that Ms Schuerman has not returned the phone calls from Dr. Serita Grit office as she needs the ATB changed from [place] to Cipro for the UTI as sensitivities came back today regarding urine culture. Ms March Rummage will try to reach Ms [place] and have her call the office.

## 2021-02-10 NOTE — Telephone Encounter (Signed)
Spoke with Candice Parks and instructed her that a new prescription for cipro has been called in. She needs to pick this up as soon as possible and stop taking the macrobid. Patient verbalizes understanding. She states she is eating and drinking well. She is more comfortable with self catheterizing and feels like her bladder is emptying. She denies fever or chills. Instructed to call office with any questions or concerns.

## 2021-02-11 ENCOUNTER — Telehealth: Payer: Self-pay

## 2021-02-11 NOTE — Telephone Encounter (Signed)
Attempted to reach Candice Parks to follow up regarding her UTI and see how she is feeling. Requested a return call.

## 2021-02-12 NOTE — Progress Notes (Signed)
GYN Oncology  Candice Parks presents to the office today for catheter removal. She states after the catheter was placed, she began experiencing moderate pelvic pressure and irritation of the vulva. The catheter was draining adequate urine. She is requesting removal of the catheter since she is unable to tolerate it in place. She states she was unable to pass flatus and have bowel movements due to the pressure of the catheter.   The catheter was removed without difficulty by Fortunato Curling, RN. The patient was then instructed on self catheterization by myself and was able to repeat the demonstration by locating her urethra and talking through the procedure. She states she has supplies at home.  Advised she would be contacted with the results of her urine culture. She is advised to monitor her urine output and self cath at least three times daily at this time with the goal to have the catheterizations decrease as she voids larger amounts. Our office will touch base with her tomorrow and see how she is doing with self cathing. She is advised to call for any questions or concerns.

## 2021-02-16 ENCOUNTER — Ambulatory Visit: Payer: Medicare Other | Admitting: Gynecologic Oncology

## 2021-02-19 ENCOUNTER — Telehealth: Payer: Self-pay

## 2021-02-19 NOTE — Telephone Encounter (Signed)
Received call from [place] requesting details for her upcoming appointment with Dr. Denman George. She is scheduled for 8/24 at 3:15 pm. Patient verbalizes understanding. Patient states she has not had any issues urinating and has stopped catheterizing at home and feels great! Instructed to call with any questions or concerns.

## 2021-03-02 NOTE — Progress Notes (Signed)
Follow-up Note: Gyn-Onc  Consult was requested by Dr. Evie Lacks for the evaluation of [name] 70 y.o. female  CC:  Chief Complaint  Patient presents with   Endometrial cancer [facility])     Assessment/Plan:  Ms. Candice Parks  is a 70 y.o.  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   Extensive extrauterine disease involvement including ovarian, cervical stromal and pelvic and aortic nodal disease.  I am recommending pre-treatment CT chest/abd/pelvis to evaluate for measurable persistent disease.  I am recommending 6 cycles of adjuvant carboplatin/paclitaxel. I am recommending consideration of post-chemotherapy external beam radiation, given the extensive pelvic disease and bulky lymph nodes that were resected. However, I would recommend repeated CT imaging prior to starting radiation to ensure there was not disease progression prior to that therapy (as this would need to be treated with additional/different systemic therapy).  She is a candidate for PLD1 inhibition in the future given her MMR-not preserved/MSI high phenotype/genotype.    HPI: Ms Candice Parks is a 70 year old P0 who was seen in consultation at the request of Dr Evie Lacks for evaluation of a fibroid uterus with thickened endometrium and postmenopausal bleeding.  The patient reported at least 1 year of postmenopausal bleeding.  She does not have a primary care provider or gynecologist.  She had known fibroids and had noted that she needed a hysterectomy but had not gotten around to seeking care for this.  She presented to the [facility] emergency department on January 05, 2021 with new onset right lower quadrant pain and this prompted a CT scan of the abdomen and pelvis to be performed on 01/05/2021.  This revealed bilateral hydronephrosis related to extrinsic compression on the ureters by an enlarged uterus.  There were multiple calcified fibroid seen and decreased attenuation  within the uterus highly suspicious for endometrial cancer.  There is a hypodensity in the pancreas at the junction of the head and body with a recommendation for MRI for further evaluation.  The uterus measured 19 x 11 x 12 cm.  No lymphadenopathy was identified.  No carcinomatosis was seen.  Interval Hx:  On 02/23/21 she underwent exploratory laparotomy, total abdominal hysterectomy, bilateral salpingo-oophorectomy, pelvic and periaortic lymphadenectomy for clinically suspicious lymph nodes. Intraoperative findings were significant for a 20 cm bulky fibroid uterus with no gross visible extrauterine extension.  There was a bulky left external iliac lymph node and bulky aortocaval lymph node.  All gross extrauterine disease (in the nodal basins) was excised.  There was no gross peritoneal disease. Surgery was challenging due to the uterine size, though overall uncomplicated.  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  The tumor infiltrated into the cervical stroma and was close to the deep cervical margin but not obviously positive at the parametrial margin.  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  The tumor was deficient in expression of MLH1 and PMS2, with hyper methylation of MLH1 present.  Of note the disease in the lymph nodes was predominantly the well differentiated carcinoma rather than deep differentiated carcinoma.  Since surgery she had some initial urinary retention which resolved with supportive observational care.  She is overall doing well now with no significant complaints.  Her case was reviewed at multidisciplinary conference and recommendations were made for systemic cytotoxic  therapy with the addition of and consideration for external beam radiation to the nodal basins and  pelvis.   Current Meds:  Outpatient Encounter Medications as of 03/03/2021  Medication Sig   ciprofloxacin (CIPRO) 250 MG tablet Take 1 tablet (250 mg total) by mouth 2 (two) times daily.   ferrous gluconate (FERGON) 324 MG tablet Take 1 tablet (324 mg total) by mouth 3 (three) times daily with meals.   ibuprofen (ADVIL) 200 MG tablet Take 200-400 mg by mouth every 6 (six) hours as needed for headache or moderate pain.   ibuprofen (ADVIL) 600 MG tablet Take 1 tablet (600 mg total) by mouth every 6 (six) hours as needed for moderate pain. For AFTER surgery   nitrofurantoin, macrocrystal-monohydrate, (MACROBID) 100 MG capsule Take 1 capsule (100 mg total) by mouth 2 (two) times daily.   oxyCODONE (OXY IR/ROXICODONE) 5 MG immediate release tablet Take 1 tablet (5 mg total) by mouth every 6 (six) hours as needed for severe pain. Do not take and drive   polyethylene glycol (MIRALAX / GLYCOLAX) 17 g packet Take 17 g by mouth 2 (two) times daily.   senna-docusate (SENOKOT-S) 8.6-50 MG tablet Take 2 tablets by mouth at bedtime. For AFTER surgery only, do not take if you are having diarrhea   No facility-administered encounter medications on file as of 03/03/2021.    Allergy: No Known Allergies  Social Hx:   Social History   Socioeconomic History   Marital status: Divorced    Spouse name: Not on file   Number of children: Not on file   Years of education: Not on file   Highest education level: Not on file  Occupational History   Not on file  Tobacco Use   Smoking status: Never    Passive exposure: Past   Smokeless tobacco: Never  Vaping Use   Vaping Use: Never used  Substance and Sexual Activity   Alcohol use: Yes    Comment: Occasional   Drug use: Never   Sexual activity: Not Currently  Other Topics Concern   Not on file  Social History Narrative   Not on file   Social Determinants of Health   Financial Resource Strain: Not on file  Food Insecurity: Not on file   Transportation Needs: Not on file  Physical Activity: Not on file  Stress: Not on file  Social Connections: Not on file  Intimate Partner Violence: Not on file    Past Surgical Hx:  Past Surgical History:  Procedure Laterality Date   WISDOM TOOTH EXTRACTION      Past Medical Hx:  Past Medical History:  Diagnosis Date   Anemia    taking iron    Past Gynecological History: See HPI, known uterine fibroids No LMP recorded. Patient is postmenopausal.  Family Hx:  Family History  Problem Relation Age of Onset   Ovarian cancer Sister    Colon cancer Neg Hx    Breast cancer Neg Hx    Endometrial cancer Neg Hx    Pancreatic cancer Neg Hx    Prostate cancer Neg Hx     Review of Systems:  Constitutional  Feels well,    ENT Normal appearing ears and nares bilaterally Skin/Breast  No rash, sores, jaundice, itching, dryness Cardiovascular  No chest pain, shortness of breath, or edema  Pulmonary  No cough or wheeze.  Gastro Intestinal  No nausea, vomitting, or diarrhoea. No bright red blood per rectum, no abdominal pain, change in bowel movement, or constipation.  Genito Urinary  No frequency, urgency, dysuria,  Musculo Skeletal  No myalgia, arthralgia, joint swelling or pain  Neurologic  No weakness, numbness, change in gait,  Psychology  No depression, anxiety, insomnia.   Vitals:  Blood pressure 127/67, pulse 86, temperature 98.5 F (36.9 C), temperature source Oral, resp. rate 18, height $RemoveBe'5\' 3"'blHydSajv$  (1.6 m), weight 145 lb 11.2 oz (66.1 kg), SpO2 100 %.  Physical Exam: WD in NAD Neck  Supple NROM, without any enlargements.  Lymph Node Survey No cervical supraclavicular or inguinal adenopathy Cardiovascular  Pulse normal rate, regularity and rhythm. S1 and S2 normal.  Lungs  Clear to auscultation bilateraly, without wheezes/crackles/rhonchi. Good air movement.  Skin  No rash/lesions/breakdown  Psychiatry  Alert and oriented to person, place, and time  Abdomen   Normoactive bowel sounds, abdomen soft, non-tender and nonobese (BMI 25) without evidence of hernia. Well healed vertical paramedian incision.  Back No CVA tenderness Genito Urinary  Vaginal cuff: well healed, suture material present, no active bleeding, no visible lesions.  Rectal  deferred Extremities  No bilateral cyanosis, clubbing or edema.   30 minutes of direct face to face counseling time was spent with the patient. This included discussion about prognosis, therapy recommendations and postoperative side effects and are beyond the scope of routine postoperative care.    Thereasa Solo, MD  03/03/2021, 5:21 PM

## 2021-03-03 ENCOUNTER — Inpatient Hospital Stay: Payer: Medicare Other

## 2021-03-03 ENCOUNTER — Other Ambulatory Visit: Payer: Self-pay

## 2021-03-03 ENCOUNTER — Inpatient Hospital Stay (HOSPITAL_BASED_OUTPATIENT_CLINIC_OR_DEPARTMENT_OTHER): Payer: Medicare Other | Admitting: Gynecologic Oncology

## 2021-03-03 VITALS — BP 127/67 | HR 86 | Temp 98.5°F | Resp 18 | Ht 63.0 in | Wt 145.7 lb

## 2021-03-03 DIAGNOSIS — Z7189 Other specified counseling: Secondary | ICD-10-CM

## 2021-03-03 DIAGNOSIS — C55 Malignant neoplasm of uterus, part unspecified: Secondary | ICD-10-CM | POA: Diagnosis not present

## 2021-03-03 DIAGNOSIS — Z90710 Acquired absence of both cervix and uterus: Secondary | ICD-10-CM

## 2021-03-03 DIAGNOSIS — Z90722 Acquired absence of ovaries, bilateral: Secondary | ICD-10-CM

## 2021-03-03 DIAGNOSIS — C541 Malignant neoplasm of endometrium: Secondary | ICD-10-CM

## 2021-03-03 LAB — BASIC METABOLIC PANEL
Anion gap: 10 (ref 5–15)
BUN: 14 mg/dL (ref 8–23)
CO2: 25 mmol/L (ref 22–32)
Calcium: 9.2 mg/dL (ref 8.9–10.3)
Chloride: 107 mmol/L (ref 98–111)
Creatinine, Ser: 0.67 mg/dL (ref 0.44–1.00)
GFR, Estimated: >60 mL/min (ref >60)
Glucose, Bld: 106 mg/dL — ABNORMAL HIGH (ref 70–99)
Potassium: 4 mmol/L (ref 3.5–5.1)
Sodium: 142 mmol/L (ref 135–145)

## 2021-03-03 NOTE — Patient Instructions (Signed)
Dr Denman George is recommending that you receive 6 doses of chemotherapy with Dr Alvy Bimler (she will set up these appointments).  She is also ordering a CT scan to evaluate for any measurable disease remaining prior to beginning therapy.  After the chemotherapy, she recommends a course of radiation and she will set this up for you also.  Dr Denman George is leaving the practice for [facility]. If you would like to follow-up with Gynecologic Oncology at the [facility], you will be able to follow-up with her partner, Dr Berline Lopes after you have completed therapy.

## 2021-03-04 ENCOUNTER — Telehealth: Payer: Self-pay | Admitting: Oncology

## 2021-03-04 NOTE — Telephone Encounter (Signed)
Called patient's insurance - [employer] due to being out of network.  They said patient does not have a cancer center within 20 miles that would be in network.  They recommended completing a gap authorization form.  Spoke to [place] at [place] and authorization form has been submitted and expedited.  Reference number [id number]. Decision will be made within 72 hours and decision will be faxed to [place].  Also faxed clinicals successfully to [phone].

## 2021-03-05 ENCOUNTER — Telehealth: Payer: Self-pay | Admitting: Oncology

## 2021-03-05 ENCOUNTER — Other Ambulatory Visit: Payer: Self-pay

## 2021-03-05 ENCOUNTER — Encounter: Payer: Self-pay | Admitting: Hematology and Oncology

## 2021-03-05 ENCOUNTER — Inpatient Hospital Stay (HOSPITAL_BASED_OUTPATIENT_CLINIC_OR_DEPARTMENT_OTHER): Payer: Medicare Other | Admitting: Hematology and Oncology

## 2021-03-05 VITALS — BP 133/62 | HR 83 | Temp 97.6°F | Resp 18 | Ht 63.0 in | Wt 145.0 lb

## 2021-03-05 DIAGNOSIS — G609 Hereditary and idiopathic neuropathy, unspecified: Secondary | ICD-10-CM | POA: Diagnosis not present

## 2021-03-05 DIAGNOSIS — D509 Iron deficiency anemia, unspecified: Secondary | ICD-10-CM

## 2021-03-05 DIAGNOSIS — C55 Malignant neoplasm of uterus, part unspecified: Secondary | ICD-10-CM

## 2021-03-05 DIAGNOSIS — C541 Malignant neoplasm of endometrium: Secondary | ICD-10-CM

## 2021-03-05 DIAGNOSIS — D539 Nutritional anemia, unspecified: Secondary | ICD-10-CM | POA: Insufficient documentation

## 2021-03-05 MED ORDER — LIDOCAINE-PRILOCAINE 2.5-2.5 % EX CREA
TOPICAL_CREAM | CUTANEOUS | 3 refills | Status: DC
Start: 2021-03-05 — End: 2022-03-04

## 2021-03-05 MED ORDER — PROCHLORPERAZINE MALEATE 10 MG PO TABS: 10.0000 mg | ORAL_TABLET | Freq: Four times a day (QID) | ORAL | 1 refills | Status: DC | PRN

## 2021-03-05 MED ORDER — ONDANSETRON HCL 8 MG PO TABS: 8.0000 mg | ORAL_TABLET | Freq: Three times a day (TID) | ORAL | 1 refills | Status: DC | PRN

## 2021-03-05 MED ORDER — DEXAMETHASONE 4 MG PO TABS: ORAL_TABLET | ORAL | 6 refills | Status: DC

## 2021-03-05 NOTE — Assessment & Plan Note (Signed)
She has idiopathic peripheral neuropathy of unknown etiology I recommend cryotherapy during treatment I will check B12 level on September 12

## 2021-03-05 NOTE — Telephone Encounter (Signed)
Called Candice Parks regarding referral to medical oncology.  She would like treatment at the [facility] in [place].  Scheduled appointment with Dr. Alvy Bimler at 1:00 with 12:30 arrival today.  She verbalized understanding and agreement.

## 2021-03-05 NOTE — Progress Notes (Signed)
START ON PATHWAY REGIMEN - Uterine     A cycle is every 21 days:     Paclitaxel      Carboplatin   **Always confirm dose/schedule in your pharmacy ordering system**  Patient Characteristics: Endometrioid, Newly Diagnosed, Postoperative (Pathologic Staging), Stage IIIC1/IIIC2 - Grade 1, 2, or 3 Histology: Endometrioid Therapeutic Status: Newly Diagnosed, Postoperative (Pathologic Staging) AJCC M Category: cM0 AJCC 8 Stage Grouping: IIIC2 AJCC T Category: pT3 AJCC N Category: pN2a Intent of Therapy: Curative Intent, Discussed with Patient

## 2021-03-05 NOTE — Assessment & Plan Note (Signed)
She has multifactorial anemia, secondary to iron deficiency and blood loss She has completed oral iron supplement treatment I will recheck her blood count, iron studies and B12 level on September 12

## 2021-03-05 NOTE — Assessment & Plan Note (Signed)
I have reviewed her all her outside documentation The patient understood she have advanced stage III disease, fully resected I agree with baseline imaging study, ordered for next week  We reviewed the NCCN guidelines We discussed the role of chemotherapy. The intent is of curative intent.  We discussed some of the risks, benefits, side-effects of carboplatin & Taxol. Treatment is intravenous, every 3 weeks x 6 cycles  Some of the short term side-effects included, though not limited to, including weight loss, life threatening infections, risk of allergic reactions, need for transfusions of blood products, nausea, vomiting, change in bowel habits, loss of hair, admission to hospital for various reasons, and risks of death.   Long term side-effects are also discussed including risks of infertility, permanent damage to nerve function, hearing loss, chronic fatigue, kidney damage with possibility needing hemodialysis, and rare secondary malignancy including bone marrow disorders.  The patient is aware that the response rates discussed earlier is not guaranteed.  After a long discussion, patient made an informed decision to proceed with the prescribed plan of care.   Patient education material was dispensed. We discussed premedication with dexamethasone before chemotherapy. I will schedule chemo education class as well as port placement prior to treatment Due to upcoming holiday, I will get her scheduled to start first dose of treatment on September 12

## 2021-03-05 NOTE — Progress Notes (Signed)
[place] CONSULT NOTE  Patient Care Team: Default, Provider, MD as PCP - General  ASSESSMENT & PLAN:  Uterine cancer [facility]) I have reviewed her all her outside documentation The patient understood she have advanced stage III disease, fully resected I agree with baseline imaging study, ordered for next week  We reviewed the NCCN guidelines We discussed the role of chemotherapy. The intent is of curative intent.  We discussed some of the risks, benefits, side-effects of carboplatin & Taxol. Treatment is intravenous, every 3 weeks x 6 cycles  Some of the short term side-effects included, though not limited to, including weight loss, life threatening infections, risk of allergic reactions, need for transfusions of blood products, nausea, vomiting, change in bowel habits, loss of hair, admission to hospital for various reasons, and risks of death.   Long term side-effects are also discussed including risks of infertility, permanent damage to nerve function, hearing loss, chronic fatigue, kidney damage with possibility needing hemodialysis, and rare secondary malignancy including bone marrow disorders.  The patient is aware that the response rates discussed earlier is not guaranteed.  After a long discussion, patient made an informed decision to proceed with the prescribed plan of care.   Patient education material was dispensed. We discussed premedication with dexamethasone before chemotherapy. I will schedule chemo education class as well as port placement prior to treatment Due to upcoming holiday, I will get her scheduled to start first dose of treatment on September 12    Deficiency anemia She has multifactorial anemia, secondary to iron deficiency and blood loss She has completed oral iron supplement treatment I will recheck her blood count, iron studies and B12 level on September 12  Idiopathic neuropathy She has idiopathic peripheral neuropathy of unknown  etiology I recommend cryotherapy during treatment I will check B12 level on September 12   Orders Placed This Encounter  Procedures   IR IMAGING GUIDED PORT INSERTION    Standing Status:   Future    Standing Expiration Date:   03/05/2022    Order Specific Question:   Reason for Exam (SYMPTOM  OR DIAGNOSIS REQUIRED)    Answer:   need port to start chemo ASAP    Order Specific Question:   Preferred Imaging Location?    Answer:   [facility]   CBC with Differential (Cancer Center Only)    Standing Status:   Standing    Number of Occurrences:   20    Standing Expiration Date:   03/05/2022   CMP ([place] only)    Standing Status:   Standing    Number of Occurrences:   20    Standing Expiration Date:   03/05/2022   Vitamin B12    Standing Status:   Future    Standing Expiration Date:   03/05/2022   Iron and TIBC    Standing Status:   Future    Standing Expiration Date:   03/05/2022   Ferritin    Standing Status:   Future    Standing Expiration Date:   03/05/2022    The total time spent in the appointment was 60 minutes encounter with patients including review of chart and various tests results, discussions about plan of care and coordination of care plan   All questions were answered. The patient knows to call the clinic with any problems, questions or concerns. No barriers to learning was detected.  [name], MD 8/26/20222:22 PM  CHIEF COMPLAINTS/PURPOSE OF CONSULTATION:  Endometrial cancer, for further  management  HISTORY OF PRESENTING ILLNESS:  Candice Parks 70 y.o. female is here because of recent diagnosis of endometrial cancer The patient is single with no children She has history of class III obesity, weigh over 300 pounds but have gradual weight loss and postmenopausal bleeding for approximately a year prior to diagnosis She underwent biopsy, imaging study and subsequent surgery at [facility] She is seen today to begin adjuvant treatment She  has completed her oral antibiotics for UTI Her energy level is great Her appetite is improving and she has gained some weight back She had history of constipation but that has resolved with regular laxatives She has unknown mild idiopathic neuropathy affecting her feet Otherwise, she felt healthy   I have reviewed her chart and materials related to her cancer extensively and collaborated history with the patient. 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   Uterine cancer ([place])  01/05/2021 Imaging   Outside CT imaging  Bilateral hydronephrosis related extrinsic compression on the ureters by the enlarged uterus.   Multiple calcified uterine fibroids although peripherally enhancing diffuse decreased attenuation is noted centrally within the uterus highly suspicious for endometrial neoplasm given the current history of vaginal bleeding. Further evaluation is recommended. MRI may be helpful. Additionally tissue sampling would likely be helpful.   Hypodensity in the pancreas at the junction of the head and body of the pancreas. This may be related to volume averaging although the possibility of a cystic lesion could not be totally excluded. MRI is recommended for further evaluation.    01/05/2021 Initial Diagnosis   The patient reported at least 1 year of postmenopausal bleeding.  She does not have a primary care provider or gynecologist.  She had known fibroids and had noted that she needed a hysterectomy but had not gotten around to seeking care for this.  She presented to the [facility] emergency department on January 05, 2021 with new onset right lower quadrant pain and this prompted a CT scan of the abdomen and pelvis to  be performed on 01/05/2021.     01/14/2021 Pathology Results   FINAL MICROSCOPIC DIAGNOSIS:   A. CERVIX, ENDOCERVIX, BIOPSY:  -  Endometrioid carcinoma, FIGO grade 1  -  See comment    01/26/2021 Imaging   MRI abdomen  1. No suspicious pancreatic mass identified. 2. Moderate to severe bilateral hydroureteronephrosis, similar to recent CT the abdomen and pelvis, presumably related to extrinsic ureteral compression from the patient's enlarged uterus, as demonstrated on recent CT the abdomen and pelvis 01/05/2021.   02/01/2021 Surgery   Surgery in [facility]  Date of Service: February 01, 2021 3:13 PM  Preoperative Diagnoses: Endometrial cancer  Postoperative Diagnoses: Endometrial cancer  Procedures: Total abdominal hysterectomy, bilateral salpingo-oophorectomy, left pelvic lymphadenectomy, right para-aortic lymphadenectomy  Surgeon: Everitt Amber, MD  Findings: Tumor appreciated in the cervical canal. Large, broad, multi fibroid uterus with a calcified fibroid filling the posterior cul de sac, an anterior lower uterine segment fibroid, and bilateral fibroids near the level of the cardinal ligament. No evidence of intraperitoneal disease. Normal appearing bilateral tubes and ovaries. Bilateral hydroureter from uterine compression. Tumor excreted from specimen into the pelvis at time of removal. An approximately 2cm enlarged left pelvic lymph node and a  1cm enlarged right aorta caval lymph node were identified.     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)   B: Lymph node, aortocaval, excision - Metastatic carcinoma involving 1 lymph node (0.4 cm) (1/1)   C: Lymph node, left pelvic, excision -Metastatic carcinoma involving 1 lymph node (0.3 cm) (1/1)   Diagnosis Comment    The majority of this tumor is composed of well differentiated (overall grade 2, but with areas consistent with grade 1) endometrioid adenocarcinoma with squamous and mucinous differentiation (approximately 70%); multifocally there is abrupt transition to an undifferentiated malignancy with a high-grade monotonous noncohesive appearance (best seen in slide A2) consistent with undifferentiated carcinoma.  The overall morphologic and immunophenotypic findings are consistent with a dedifferentiated endometrial carcinoma.   Of note, the carcinoma present in the lymph nodes is well differentiated endometrioid carcinoma and the metastatic deposit in the ovary is undifferentiated carcinoma. Carcinoma is present less than 1 mm from uterine corpus serosa (in the lower uterine segment).  The assessment of the paracervical soft tissue margins is challenging due to the disrupted nature of the specimen in this area, however tumor appears present less than 1 mm from paracervical soft tissue.  No definite parametrial invasion is identified.   Immunohistochemistry performed at [facility] (with appropriate controls) demonstrates the following immunoprofile in cells of interest: Well differentiated endometrioid carcinoma: CK AE1/AE3: Positive ER: Positive (>90%, strong staining) P53: Favor wild-type (see comment below)   Dedifferentiated carcinoma: CK AE1/AE3: Very rare cells, predominantly negative ER: Negative P53: Small focus of mutant type staining in a background of wild-type staining (see comment below)   P53 staining comment: In the well differentiated carcinoma component the staining pattern is predominantly wild-type although focally there is slightly increased  levels of staining noted.  In the dedifferentiated tumor component, most of the tissue exhibits wild-type pattern staining, however there is a distinct small area that exhibits increased/overexpressed mutant pattern p53 staining.    Immunohistochemistry testing for mismatch repair (MMR) proteins is performed on block A2:   MLH1:  Loss of nuclear expression MSH2:  Intact nuclear expression MSH6:  Intact nuclear expression PMS2:  Loss of nuclear expression   Loss of nuclear expression of MLH1 and PMS2 can be a result of sporadic microsatellite instability (via hypermethylation of the MLH1 promoter region) or due to Lynch syndrome / [place]. Materials will be submitted to the Molecular Pathology Laboratory [phone]) for [facility] promoter hypermethylation testing. These results will be issued in a separate report.      03/05/2021 Initial Diagnosis   Uterine cancer ([place])   03/05/2021 Cancer Staging   Staging form: Corpus Uteri - Carcinoma and Carcinosarcoma, AJCC 8th Edition - Pathologic stage from 03/05/2021: Stage IIIC2 (pT3, pN2a, cM0) - Signed by [name], MD on 03/05/2021 Stage prefix: Initial diagnosis   03/22/2021 -  Chemotherapy    Patient is on Treatment Plan: UTERINE CARBOPLATIN AUC 6 / PACLITAXEL Q21D         MEDICAL HISTORY:  Past Medical History:  Diagnosis Date   Anemia    taking iron    SURGICAL  HISTORY: Past Surgical History:  Procedure Laterality Date   ABDOMINAL HYSTERECTOMY     WISDOM TOOTH EXTRACTION      SOCIAL HISTORY: Social History   Socioeconomic History   Marital status: Divorced    Spouse name: Not on file   Number of children: 0   Years of education: Not on file   Highest education level: Not on file  Occupational History   Not on file  Tobacco Use   Smoking status: Never    Passive exposure: Past   Smokeless tobacco: Never  Vaping Use   Vaping Use: Never used  Substance and Sexual Activity   Alcohol use: Yes    Comment: Occasional    Drug use: Never   Sexual activity: Not Currently  Other Topics Concern   Not on file  Social History Narrative   Not on file   Social Determinants of Health   Financial Resource Strain: Not on file  Food Insecurity: Not on file  Transportation Needs: Not on file  Physical Activity: Not on file  Stress: Not on file  Social Connections: Not on file  Intimate Partner Violence: Not on file    FAMILY HISTORY: Family History  Problem Relation Age of Onset   Ovarian cancer Sister    Colon cancer Neg Hx    Breast cancer Neg Hx    Endometrial cancer Neg Hx    Pancreatic cancer Neg Hx    Prostate cancer Neg Hx     ALLERGIES:  has No Known Allergies.  MEDICATIONS:  Current Outpatient Medications  Medication Sig Dispense Refill   dexamethasone (DECADRON) 4 MG tablet Take 2 tabs at the night before and 2 tab the morning of chemotherapy, every 3 weeks, by mouth x 6 cycles 36 tablet 6   lidocaine-prilocaine (EMLA) cream Apply to affected area once 30 g 3   ondansetron (ZOFRAN) 8 MG tablet Take 1 tablet (8 mg total) by mouth every 8 (eight) hours as needed. 30 tablet 1   prochlorperazine (COMPAZINE) 10 MG tablet Take 1 tablet (10 mg total) by mouth every 6 (six) hours as needed (Nausea or vomiting). 30 tablet 1   No current facility-administered medications for this visit.    REVIEW OF SYSTEMS:   Constitutional: Denies fevers, chills or abnormal night sweats Eyes: Denies blurriness of vision, double vision or watery eyes Ears, nose, mouth, throat, and face: Denies mucositis or sore throat Respiratory: Denies cough, dyspnea or wheezes Cardiovascular: Denies palpitation, chest discomfort or lower extremity swelling Gastrointestinal:  Denies nausea, heartburn or change in bowel habits Skin: Denies abnormal skin rashes Lymphatics: Denies new lymphadenopathy or easy bruising Behavioral/Psych: Mood is stable, no new changes  All other systems were reviewed with the patient and are  negative.  PHYSICAL EXAMINATION: ECOG PERFORMANCE STATUS: 1 - Symptomatic but completely ambulatory  Vitals:   03/05/21 1354  BP: 133/62  Pulse: 83  Resp: 18  Temp: 97.6 F (36.4 C)  SpO2: 100%   Filed Weights   03/05/21 1354  Weight: 145 lb (65.8 kg)    GENERAL:alert, no distress and comfortable SKIN: skin color, texture, turgor are normal, no rashes or significant lesions.  Noted loose skin EYES: normal, conjunctiva are pink and non-injected, sclera clear OROPHARYNX:no exudate, no erythema and lips, buccal mucosa, and tongue normal  NECK: supple, thyroid normal size, non-tender, without nodularity LYMPH:  no palpable lymphadenopathy in the cervical, axillary or inguinal LUNGS: clear to auscultation and percussion with normal breathing effort HEART: regular rate & rhythm  and no murmurs and no lower extremity edema ABDOMEN:abdomen soft, non-tender and normal bowel sounds.  Noted well-healed surgical scar Musculoskeletal:no cyanosis of digits and no clubbing  PSYCH: alert & oriented x 3 with fluent speech NEURO: no focal motor/sensory deficits  LABORATORY DATA:  I have reviewed the data as listed Lab Results  Component Value Date   WBC 9.2 01/26/2021   HGB 6.3 (LL) 01/26/2021   HCT 22.8 (L) 01/26/2021   MCV 64.2 (L) 01/26/2021   PLT 567 (H) 01/26/2021   Recent Labs    01/26/21 1016 03/03/21 1622  NA 142 142  K 3.9 4.0  CL 107 107  CO2 27 25  GLUCOSE 78 106*  BUN 13 14  CREATININE 0.84 0.67  CALCIUM 8.9 9.2  GFRNONAA >60 >60

## 2021-03-08 ENCOUNTER — Telehealth: Payer: Self-pay | Admitting: Oncology

## 2021-03-08 NOTE — Telephone Encounter (Signed)
Called Candice Parks and scheduled patient education class for 03/17/21 at 12:00.  Also reviewed instructions and times for her CT scan on 03/11/21.  Discussed that she will be receiving a call from IR to schedule her port and that she will need a driver to take her home that day.  She verbalized understanding and agreement.

## 2021-03-10 ENCOUNTER — Other Ambulatory Visit (HOSPITAL_COMMUNITY): Payer: Medicare Other

## 2021-03-11 ENCOUNTER — Other Ambulatory Visit: Payer: Self-pay

## 2021-03-11 ENCOUNTER — Ambulatory Visit (HOSPITAL_COMMUNITY)
Admission: RE | Admit: 2021-03-11 | Discharge: 2021-03-11 | Disposition: A | Discharge status: Home / Self Care | Payer: Medicare Other | Source: Ambulatory Visit | Attending: Gynecologic Oncology | Admitting: Gynecologic Oncology

## 2021-03-11 DIAGNOSIS — C801 Malignant (primary) neoplasm, unspecified: Secondary | ICD-10-CM

## 2021-03-11 DIAGNOSIS — C541 Malignant neoplasm of endometrium: Secondary | ICD-10-CM | POA: Diagnosis not present

## 2021-03-11 HISTORY — DX: Malignant (primary) neoplasm, unspecified: C80.1

## 2021-03-11 MED ORDER — IOHEXOL 350 MG/ML SOLN
80.0000 mL | Freq: Once | INTRAVENOUS | Status: AC | PRN
  Administered 2021-03-11: 80 mL via INTRAVENOUS

## 2021-03-16 NOTE — Progress Notes (Signed)
Pharmacist Chemotherapy Monitoring - Initial Assessment    Anticipated start date: 03/23/21   The following has been reviewed per standard work regarding the patient's treatment regimen: The patient's diagnosis, treatment plan and drug doses, and organ/hematologic function Lab orders and baseline tests specific to treatment regimen  The treatment plan start date, drug sequencing, and pre-medications Prior authorization status  Patient's documented medication list, including drug-drug interaction screen and prescriptions for anti-emetics and supportive care specific to the treatment regimen The drug concentrations, fluid compatibility, administration routes, and timing of the medications to be used The patient's access for treatment and lifetime cumulative dose history, if applicable  The patient's medication allergies and previous infusion related reactions, if applicable   Changes made to treatment plan:  treatment plan date  Follow up needed:  Pending authorization for treatment     Kennith Center, Pharm.D., CPP 03/16/2021'@2'$ :49 PM

## 2021-03-17 ENCOUNTER — Other Ambulatory Visit: Payer: Self-pay

## 2021-03-17 ENCOUNTER — Inpatient Hospital Stay: Payer: Medicare Other | Attending: Gynecologic Oncology

## 2021-03-17 DIAGNOSIS — Z9079 Acquired absence of other genital organ(s): Secondary | ICD-10-CM | POA: Insufficient documentation

## 2021-03-17 DIAGNOSIS — Z90722 Acquired absence of ovaries, bilateral: Secondary | ICD-10-CM | POA: Insufficient documentation

## 2021-03-17 DIAGNOSIS — E538 Deficiency of other specified B group vitamins: Secondary | ICD-10-CM | POA: Insufficient documentation

## 2021-03-17 DIAGNOSIS — Z90710 Acquired absence of both cervix and uterus: Secondary | ICD-10-CM | POA: Insufficient documentation

## 2021-03-17 DIAGNOSIS — C55 Malignant neoplasm of uterus, part unspecified: Secondary | ICD-10-CM | POA: Insufficient documentation

## 2021-03-17 DIAGNOSIS — Z79899 Other long term (current) drug therapy: Secondary | ICD-10-CM | POA: Insufficient documentation

## 2021-03-17 DIAGNOSIS — G609 Hereditary and idiopathic neuropathy, unspecified: Secondary | ICD-10-CM | POA: Insufficient documentation

## 2021-03-17 DIAGNOSIS — Z5111 Encounter for antineoplastic chemotherapy: Secondary | ICD-10-CM | POA: Insufficient documentation

## 2021-03-17 DIAGNOSIS — D508 Other iron deficiency anemias: Secondary | ICD-10-CM | POA: Insufficient documentation

## 2021-03-19 ENCOUNTER — Encounter: Payer: Self-pay | Admitting: Hematology and Oncology

## 2021-03-19 ENCOUNTER — Other Ambulatory Visit: Payer: Self-pay | Admitting: Student

## 2021-03-19 NOTE — Progress Notes (Signed)
Called pt to introduce myself as her Arboriculturist.  Unfortunately there aren't any foundations offering copay assistance for her Dx and the type of ins she has.  I informed her on the [employer], went over what it covers and gave her the income requirement.  Pt would like to apply so she will bring her proof of income on 03/22/21.  If approved I will give her an expense sheet and my card for any questions or concerns she may have in the future.

## 2021-03-22 ENCOUNTER — Inpatient Hospital Stay (HOSPITAL_BASED_OUTPATIENT_CLINIC_OR_DEPARTMENT_OTHER): Payer: Medicare Other | Admitting: Hematology and Oncology

## 2021-03-22 ENCOUNTER — Other Ambulatory Visit: Payer: Medicare Other

## 2021-03-22 ENCOUNTER — Other Ambulatory Visit: Payer: Self-pay

## 2021-03-22 ENCOUNTER — Ambulatory Visit (HOSPITAL_COMMUNITY)
Admission: RE | Admit: 2021-03-22 | Discharge: 2021-03-22 | Disposition: A | Discharge status: Home / Self Care | Payer: Medicare Other | Source: Ambulatory Visit | Attending: Hematology and Oncology | Admitting: Hematology and Oncology

## 2021-03-22 ENCOUNTER — Inpatient Hospital Stay: Payer: Medicare Other

## 2021-03-22 ENCOUNTER — Other Ambulatory Visit: Payer: Self-pay | Admitting: Hematology and Oncology

## 2021-03-22 ENCOUNTER — Encounter: Payer: Self-pay | Admitting: Hematology and Oncology

## 2021-03-22 ENCOUNTER — Encounter (HOSPITAL_COMMUNITY): Payer: Self-pay

## 2021-03-22 DIAGNOSIS — G609 Hereditary and idiopathic neuropathy, unspecified: Secondary | ICD-10-CM

## 2021-03-22 DIAGNOSIS — Z5111 Encounter for antineoplastic chemotherapy: Secondary | ICD-10-CM | POA: Diagnosis present

## 2021-03-22 DIAGNOSIS — Z9079 Acquired absence of other genital organ(s): Secondary | ICD-10-CM | POA: Diagnosis not present

## 2021-03-22 DIAGNOSIS — D509 Iron deficiency anemia, unspecified: Secondary | ICD-10-CM | POA: Insufficient documentation

## 2021-03-22 DIAGNOSIS — C541 Malignant neoplasm of endometrium: Secondary | ICD-10-CM | POA: Diagnosis not present

## 2021-03-22 DIAGNOSIS — D539 Nutritional anemia, unspecified: Secondary | ICD-10-CM

## 2021-03-22 DIAGNOSIS — Z90710 Acquired absence of both cervix and uterus: Secondary | ICD-10-CM | POA: Diagnosis not present

## 2021-03-22 DIAGNOSIS — C55 Malignant neoplasm of uterus, part unspecified: Secondary | ICD-10-CM

## 2021-03-22 DIAGNOSIS — Z79899 Other long term (current) drug therapy: Secondary | ICD-10-CM | POA: Diagnosis not present

## 2021-03-22 DIAGNOSIS — E538 Deficiency of other specified B group vitamins: Secondary | ICD-10-CM | POA: Diagnosis not present

## 2021-03-22 DIAGNOSIS — Z90722 Acquired absence of ovaries, bilateral: Secondary | ICD-10-CM | POA: Diagnosis not present

## 2021-03-22 DIAGNOSIS — D508 Other iron deficiency anemias: Secondary | ICD-10-CM | POA: Diagnosis not present

## 2021-03-22 HISTORY — PX: IR IMAGING GUIDED PORT INSERTION: IMG5740

## 2021-03-22 LAB — CMP (CANCER CENTER ONLY)
ALT: 20 U/L (ref 0–44)
AST: 17 U/L (ref 15–41)
Albumin: 3.5 g/dL (ref 3.5–5.0)
Alkaline Phosphatase: 101 U/L (ref 38–126)
Anion gap: 8 (ref 5–15)
BUN: 20 mg/dL (ref 8–23)
CO2: 26 mmol/L (ref 22–32)
Calcium: 9.6 mg/dL (ref 8.9–10.3)
Chloride: 107 mmol/L (ref 98–111)
Creatinine: 0.71 mg/dL (ref 0.44–1.00)
GFR, Estimated: >60 mL/min (ref >60)
Glucose, Bld: 97 mg/dL (ref 70–99)
Potassium: 4.2 mmol/L (ref 3.5–5.1)
Sodium: 141 mmol/L (ref 135–145)
Total Bilirubin: 0.3 mg/dL (ref 0.3–1.2)
Total Protein: 7.3 g/dL (ref 6.5–8.1)

## 2021-03-22 LAB — IRON AND TIBC
Iron: 31 ug/dL — ABNORMAL LOW (ref 41–142)
Saturation Ratios: 9 % — ABNORMAL LOW (ref 21–57)
TIBC: 348 ug/dL (ref 236–444)
UIBC: 316 ug/dL (ref 120–384)

## 2021-03-22 LAB — CBC WITH DIFFERENTIAL (CANCER CENTER ONLY)
Abs Immature Granulocytes: 0.02 10*3/uL (ref 0.00–0.07)
Basophils Absolute: 0.1 10*3/uL (ref 0.0–0.1)
Basophils Relative: 1 %
Eosinophils Absolute: 0.2 10*3/uL (ref 0.0–0.5)
Eosinophils Relative: 2 %
HCT: 29.8 % — ABNORMAL LOW (ref 36.0–46.0)
Hemoglobin: 8.8 g/dL — ABNORMAL LOW (ref 12.0–15.0)
Immature Granulocytes: 0 %
Lymphocytes Relative: 21 %
Lymphs Abs: 1.4 10*3/uL (ref 0.7–4.0)
MCH: 21.7 pg — ABNORMAL LOW (ref 26.0–34.0)
MCHC: 29.5 g/dL — ABNORMAL LOW (ref 30.0–36.0)
MCV: 73.4 fL — ABNORMAL LOW (ref 80.0–100.0)
Monocytes Absolute: 0.4 10*3/uL (ref 0.1–1.0)
Monocytes Relative: 5 %
Neutro Abs: 4.7 10*3/uL (ref 1.7–7.7)
Neutrophils Relative %: 71 %
Platelet Count: 371 10*3/uL (ref 150–400)
RBC: 4.06 MIL/uL (ref 3.87–5.11)
RDW: 21.6 % — ABNORMAL HIGH (ref 11.5–15.5)
WBC Count: 6.7 10*3/uL (ref 4.0–10.5)
nRBC: 0 % (ref 0.0–0.2)

## 2021-03-22 LAB — FERRITIN: Ferritin: 8 ng/mL — ABNORMAL LOW (ref 11–307)

## 2021-03-22 LAB — VITAMIN B12: Vitamin B-12: 147 pg/mL — ABNORMAL LOW (ref 180–914)

## 2021-03-22 MED ORDER — LIDOCAINE HCL 1 % IJ SOLN
INTRAMUSCULAR | Status: DC | PRN
  Administered 2021-03-22: 5 mL

## 2021-03-22 MED ORDER — MIDAZOLAM HCL 2 MG/2ML IJ SOLN
INTRAMUSCULAR | Status: DC | PRN
  Administered 2021-03-22: 1 mg via INTRAVENOUS

## 2021-03-22 MED ORDER — SODIUM CHLORIDE 0.9 % IV SOLN: INTRAVENOUS | Status: DC

## 2021-03-22 MED ORDER — LIDOCAINE-EPINEPHRINE (PF) 1 %-1:200000 IJ SOLN
INTRAMUSCULAR | Status: DC | PRN
  Administered 2021-03-22: 10 mL

## 2021-03-22 MED ORDER — LIDOCAINE HCL 1 % IJ SOLN
INTRAMUSCULAR | Status: AC
  Administered 2021-03-22: 10 mL via SUBCUTANEOUS
  Filled 2021-03-22: qty 20

## 2021-03-22 MED ORDER — FENTANYL CITRATE (PF) 100 MCG/2ML IJ SOLN
INTRAMUSCULAR | Status: DC | PRN
  Administered 2021-03-22: 50 ug via INTRAVENOUS

## 2021-03-22 MED ORDER — HEPARIN SOD (PORK) LOCK FLUSH 100 UNIT/ML IV SOLN
INTRAVENOUS | Status: AC
  Administered 2021-03-22: 5 [IU]
  Filled 2021-03-22: qty 5

## 2021-03-22 MED ORDER — FENTANYL CITRATE (PF) 100 MCG/2ML IJ SOLN
INTRAMUSCULAR | Status: AC
  Filled 2021-03-22: qty 2

## 2021-03-22 MED ORDER — HEPARIN SOD (PORK) LOCK FLUSH 100 UNIT/ML IV SOLN
INTRAVENOUS | Status: DC | PRN
  Administered 2021-03-22: 500 [IU] via INTRAVENOUS

## 2021-03-22 MED ORDER — MIDAZOLAM HCL 2 MG/2ML IJ SOLN
INTRAMUSCULAR | Status: AC
  Filled 2021-03-22: qty 2

## 2021-03-22 MED ORDER — LIDOCAINE-EPINEPHRINE 1 %-1:100000 IJ SOLN
INTRAMUSCULAR | Status: AC
  Administered 2021-03-22: 10 mL via SUBCUTANEOUS
  Filled 2021-03-22: qty 1

## 2021-03-22 MED FILL — Dexamethasone Sodium Phosphate Inj 100 MG/10ML: INTRAMUSCULAR | Qty: 1 | Status: AC

## 2021-03-22 MED FILL — Fosaprepitant Dimeglumine For IV Infusion 150 MG (Base Eq): INTRAVENOUS | Qty: 5 | Status: AC

## 2021-03-22 NOTE — H&P (Addendum)
Chief Complaint: Patient was seen in consultation today for image guided port insertion at the request of [facility]  Referring Physician(s): Gorsuch,Ni  Supervising Physician: Markus Daft  Patient Status: [facility] - Out-pt  History of Present Illness: Candice Parks is a pleasant 70 y.o. female with diagnosis of endometrial cancer, one month status post total hysterectomy.  Remaining soft tissue mass concerning for malignancy within the vagina. Additionally, [name] has both b12 and iron deficiency anemia.  Pt is expected to begin chemotherapy this week and also receive iron and B12 infusions during these sessions. She reports being NPO, has a driver, and someone to help her once home.  She voices readiness to proceed.  No complaints today.   Past Medical History:  Diagnosis Date   Anemia    taking iron    Past Surgical History:  Procedure Laterality Date   ABDOMINAL HYSTERECTOMY     WISDOM TOOTH EXTRACTION      Allergies: Patient has no known allergies.  Medications: Prior to Admission medications   Medication Sig Start Date End Date Taking? Authorizing Provider  dexamethasone (DECADRON) 4 MG tablet Take 2 tabs at the night before and 2 tab the morning of chemotherapy, every 3 weeks, by mouth x 6 cycles 03/05/21   [name], MD  lidocaine-prilocaine (EMLA) cream Apply to affected area once 03/05/21   [name], MD  ondansetron (ZOFRAN) 8 MG tablet Take 1 tablet (8 mg total) by mouth every 8 (eight) hours as needed. 03/05/21   [name], MD  prochlorperazine (COMPAZINE) 10 MG tablet Take 1 tablet (10 mg total) by mouth every 6 (six) hours as needed (Nausea or vomiting). 03/05/21   [name], MD     Family History  Problem Relation Age of Onset   Ovarian cancer Sister    Colon cancer Neg Hx    Breast cancer Neg Hx    Endometrial cancer Neg Hx    Pancreatic cancer Neg Hx    Prostate cancer Neg Hx     Social History   Socioeconomic History   Marital  status: Divorced    Spouse name: Not on file   Number of children: 0   Years of education: Not on file   Highest education level: Not on file  Occupational History   Not on file  Tobacco Use   Smoking status: Never    Passive exposure: Past   Smokeless tobacco: Never  Vaping Use   Vaping Use: Never used  Substance and Sexual Activity   Alcohol use: Yes    Comment: Occasional   Drug use: Never   Sexual activity: Not Currently  Other Topics Concern   Not on file  Social History Narrative   Not on file   Social Determinants of Health   Financial Resource Strain: Not on file  Food Insecurity: Not on file  Transportation Needs: Not on file  Physical Activity: Not on file  Stress: Not on file  Social Connections: Not on file    Review of Systems: A 12 point ROS discussed and pertinent positives are indicated in the HPI above.  All other systems are negative.  Review of Systems  Constitutional: Negative.   HENT: Negative.    Respiratory: Negative.    Cardiovascular: Negative.   Gastrointestinal: Negative.   Endocrine: Negative.   Genitourinary: Negative.   Neurological: Negative.   Psychiatric/Behavioral:  Negative for agitation and behavioral problems.    Vital Signs: BP 135/73   Pulse 68   Temp  98.2 F (36.8 C) (Oral)   Resp 18   SpO2 100%   Physical Exam Vitals reviewed.  Constitutional:      Appearance: Normal appearance.  HENT:     Head: Normocephalic and atraumatic.     Mouth/Throat:     Mouth: Mucous membranes are moist.     Pharynx: Oropharynx is clear.  Cardiovascular:     Rate and Rhythm: Normal rate and regular rhythm.     Pulses: Normal pulses.     Heart sounds: Normal heart sounds.  Pulmonary:     Effort: Pulmonary effort is normal. No respiratory distress.     Breath sounds: Normal breath sounds. No wheezing or rales.  Abdominal:     General: Abdomen is flat.     Palpations: Abdomen is soft.  Musculoskeletal:        General: Normal  range of motion.  Skin:    General: Skin is warm and dry.     Capillary Refill: Capillary refill takes less than 2 seconds.  Neurological:     Mental Status: She is alert and oriented to person, place, and time.  Psychiatric:        Mood and Affect: Mood normal.        Behavior: Behavior normal.        Thought Content: Thought content normal.        Judgment: Judgment normal.    Imaging: CT Abdomen Pelvis W Contrast  Result Date: 03/12/2021 CLINICAL DATA:  Endometrial cancer, treatment planning EXAM: CT ABDOMEN AND PELVIS WITH CONTRAST TECHNIQUE: Multidetector CT imaging of the abdomen and pelvis was performed using the standard protocol following bolus administration of intravenous contrast. CONTRAST:  72m OMNIPAQUE IOHEXOL 350 MG/ML SOLN, additional oral enteric contrast COMPARISON:  CT abdomen pelvis, 01/05/2021 FINDINGS: Lower chest: No acute abnormality. Hepatobiliary: No solid liver abnormality is seen. No gallstones, gallbladder wall thickening, or biliary dilatation. Pancreas: Unremarkable. No pancreatic ductal dilatation or surrounding inflammatory changes. Spleen: Normal in size without significant abnormality. Adrenals/Urinary Tract: Adrenal glands are unremarkable. Kidneys are normal, without renal calculi, solid lesion, or hydronephrosis. Bladder is unremarkable. Stomach/Bowel: Stomach is within normal limits. Appendix appears normal. No evidence of bowel wall thickening, distention, or inflammatory changes. Vascular/Lymphatic: Aortic atherosclerosis. There is at least one new newly enlarged left iliac lymph node measuring up to 1.4 x 1.3 cm (series 2, image 55). Reproductive: Status post interval hysterectomy and oophorectomy. There is a rim enhancing soft tissue mass centered in the vagina, measuring approximately 4.4 x 3.1 cm (series 2, image 84). This is new, or significantly enlarged compared to prior examination dated 01/05/2021. Other: Status post interval midline laparotomy.  Subcutaneous fluid within the inferior aspect of the wound (series 2, image 59). No abdominopelvic ascites. Fluid attenuation lymphocele or seroma adjacent to surgical clip at the level right ovarian vein, measuring 4.4 x 3.4 cm (series 2, image 43). Musculoskeletal: No acute or significant osseous findings. IMPRESSION: 1. Status post interval hysterectomy and oophorectomy. 2. There is a rim enhancing soft tissue mass centered in the vagina, measuring approximately 4.4 x 3.1 cm. This is new, or significantly enlarged compared to prior examination dated 01/05/2021. Findings are concerning for malignant involvement. Correlate with physical examination. Contrast enhanced MRI may be helpful to further detail soft tissue anatomy of the low pelvis if indicated. 3. There is at least one newly enlarged left iliac lymph node measuring up to 1.4 x 1.3 cm, nonspecific although concerning for nodal metastatic disease. 4. Fluid attenuation lymphocele or  seroma adjacent to surgical clip at the level right ovarian vein. 5. Status post interval midline laparotomy. Subcutaneous fluid within the inferior aspect of the wound. The presence or absence of infection is not established by CT. Aortic Atherosclerosis (ICD10-I70.0). Electronically Signed   By: Eddie Candle M.D.   On: 03/12/2021 15:45    Labs:  CBC: Recent Labs    01/26/21 1016 03/22/21 0923  WBC 9.2 6.7  HGB 6.3* 8.8*  HCT 22.8* 29.8*  PLT 567* 371    COAGS: No results for input(s): INR, APTT in the last 8760 hours.  BMP: Recent Labs    01/26/21 1016 03/03/21 1622 03/22/21 0923  NA 142 142 141  K 3.9 4.0 4.2  CL 107 107 107  CO2 '27 25 26  '$ GLUCOSE 78 106* 97  BUN '13 14 20  '$ CALCIUM 8.9 9.2 9.6  CREATININE 0.84 0.67 0.71  GFRNONAA >60 >60 >60    LIVER FUNCTION TESTS: Recent Labs    03/22/21 0923  BILITOT 0.3  AST 17  ALT 20  ALKPHOS 101  PROT 7.3  ALBUMIN 3.5    Assessment and Plan:  Endometrial Cancer, s/p total hysterectomy, now  with soft tissue mass within vagina, planning to start chemotherapy this week.   OK to proceed with planned procedure.  Risks and benefits of image guided port-a-catheter placement was discussed with the patient including, but not limited to bleeding, infection, pneumothorax, or fibrin sheath development and need for additional procedures.  All of the patient's questions were answered, patient is agreeable to proceed. Consent signed and in chart.   Thank you for this interesting consult.  I greatly enjoyed meeting Nallely Paxtyn Chitwood and look forward to participating in their care.  A copy of this report was sent to the requesting provider on this date.  Electronically Signed: Pasty Spillers, PA 03/22/2021, 1:17 PM   I spent a total of 30 Minutes  in face to face in clinical consultation, greater than 50% of which was counseling/coordinating care for image guided port-a-catheter placement.

## 2021-03-22 NOTE — Discharge Instructions (Signed)

## 2021-03-22 NOTE — Assessment & Plan Note (Signed)
She has both combined B12 and iron deficiency She does not need blood transfusion I will try to get prior authorization for IV iron and B12 to be given along with treatment

## 2021-03-22 NOTE — Assessment & Plan Note (Signed)
She had minor pre-existing peripheral neuropathy but could be exacerbated by B12 deficiency I will try to get her B12 injection authorized

## 2021-03-22 NOTE — Progress Notes (Signed)
[facility] OFFICE PROGRESS NOTE  Patient Care Team: Default, Provider, MD as PCP - General  ASSESSMENT & PLAN:  Uterine cancer ([place]) I have reviewed CT imaging with her She had postoperative changes The new lymphadenopathy could be related to postop versus disease, unknown She will pursue treatment tomorrow as scheduled She will pursue port placement today I reviewed with her common side effects and reminded her to take premedication I will see her prior to cycle 2 of treatment  Deficiency anemia She has both combined B12 and iron deficiency She does not need blood transfusion I will try to get prior authorization for IV iron and B12 to be given along with treatment  Idiopathic neuropathy She had minor pre-existing peripheral neuropathy but could be exacerbated by B12 deficiency I will try to get her B12 injection authorized  No orders of the defined types were placed in this encounter.   All questions were answered. The patient knows to call the clinic with any problems, questions or concerns. The total time spent in the appointment was 40 minutes encounter with patients including review of chart and various tests results, discussions about plan of care and coordination of care plan   Candice Lark, MD 03/22/2021 12:51 PM  INTERVAL HISTORY: Please see below for problem oriented charting. she returns for follow-up and to be seen prior to cycle 1 of treatment She had CT imaging done after I saw her Her wound is healing well and she is eating well and gaining weight She had recent constipation but resolved after CT imaging She has no new symptoms otherwise Denies recent shortness of breath The patient denies any recent signs or symptoms of bleeding such as spontaneous epistaxis, hematuria or hematochezia.   REVIEW OF SYSTEMS:   Constitutional: Denies fevers, chills or abnormal weight loss Eyes: Denies blurriness of vision Ears, nose, mouth, throat, and face:  Denies mucositis or sore throat Respiratory: Denies cough, dyspnea or wheezes Cardiovascular: Denies palpitation, chest discomfort or lower extremity swelling Gastrointestinal:  Denies nausea, heartburn or change in bowel habits Skin: Denies abnormal skin rashes Lymphatics: Denies new lymphadenopathy or easy bruising Neurological:Denies numbness, tingling or new weaknesses Behavioral/Psych: Mood is stable, no new changes  All other systems were reviewed with the patient and are negative.  I have reviewed the past medical history, past surgical history, social history and family history with the patient and they are unchanged from previous note.  ALLERGIES:  has No Known Allergies.  MEDICATIONS:  Current Outpatient Medications  Medication Sig Dispense Refill   dexamethasone (DECADRON) 4 MG tablet Take 2 tabs at the night before and 2 tab the morning of chemotherapy, every 3 weeks, by mouth x 6 cycles 36 tablet 6   lidocaine-prilocaine (EMLA) cream Apply to affected area once 30 g 3   ondansetron (ZOFRAN) 8 MG tablet Take 1 tablet (8 mg total) by mouth every 8 (eight) hours as needed. 30 tablet 1   prochlorperazine (COMPAZINE) 10 MG tablet Take 1 tablet (10 mg total) by mouth every 6 (six) hours as needed (Nausea or vomiting). 30 tablet 1   No current facility-administered medications for this visit.   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   Uterine  cancer ([place])  01/05/2021 Imaging   Outside CT imaging  Bilateral hydronephrosis related extrinsic compression on the ureters by the enlarged uterus.   Multiple calcified uterine fibroids although peripherally enhancing diffuse decreased attenuation is noted centrally within the uterus highly suspicious for endometrial neoplasm given the current history of vaginal bleeding. Further evaluation is recommended. MRI may be helpful. Additionally tissue sampling would likely be helpful.   Hypodensity in the pancreas at the junction of the head and body of the pancreas. This may be related to volume averaging although the possibility of a cystic lesion could not be totally excluded. MRI is recommended for further evaluation.    01/05/2021 Initial Diagnosis   The patient reported at least 1 year of postmenopausal bleeding.  She does not have a primary care provider or gynecologist.  She had known fibroids and had noted that she needed a hysterectomy but had not gotten around to seeking care for this.  She presented to the [facility] emergency department on January 05, 2021 with new onset right lower quadrant pain and this prompted a CT scan of the abdomen and pelvis to be performed on 01/05/2021.     01/14/2021 Pathology Results   FINAL MICROSCOPIC DIAGNOSIS:   A. CERVIX, ENDOCERVIX, BIOPSY:  -  Endometrioid carcinoma, FIGO grade 1  -  See comment    01/26/2021 Imaging   MRI abdomen  1. No suspicious pancreatic mass identified. 2. Moderate to severe bilateral hydroureteronephrosis, similar to recent CT the abdomen and pelvis, presumably related to extrinsic ureteral compression from the patient's enlarged uterus, as demonstrated on recent CT the abdomen and pelvis 01/05/2021.   02/01/2021 Surgery   Surgery in [facility]  Date of Service: February 01, 2021 3:13 PM  Preoperative Diagnoses: Endometrial cancer  Postoperative Diagnoses: Endometrial cancer  Procedures: Total abdominal hysterectomy, bilateral  salpingo-oophorectomy, left pelvic lymphadenectomy, right para-aortic lymphadenectomy  Surgeon: Everitt Amber, MD  Findings: Tumor appreciated in the cervical canal. Large, broad, multi fibroid uterus with a calcified fibroid filling the posterior cul de sac, an anterior lower uterine segment fibroid, and bilateral fibroids near the level of the cardinal ligament. No evidence of intraperitoneal disease. Normal appearing bilateral tubes and ovaries. Bilateral hydroureter from uterine compression. Tumor excreted from specimen into the pelvis at time of removal. An approximately 2cm enlarged left pelvic lymph node and a 1cm enlarged right aorta caval lymph node were identified.     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) - Right ovary, negative for tumor - Bilateral fallopian tubes, negative for tumor - Multiple leiomyomata (up to 8.5 cm)   B: Lymph node, aortocaval, excision - Metastatic carcinoma involving 1 lymph node (0.4 cm) (1/1)   C: Lymph node, left pelvic, excision -Metastatic carcinoma involving 1 lymph node (0.3 cm) (1/1)   Diagnosis Comment    The majority of this tumor is composed of well differentiated (overall grade 2, but with areas consistent with grade 1) endometrioid adenocarcinoma with squamous and mucinous differentiation (approximately 70%);  multifocally there is abrupt transition to an undifferentiated malignancy with a high-grade monotonous noncohesive appearance (best seen in slide A2) consistent with undifferentiated carcinoma.  The overall morphologic and immunophenotypic findings are consistent with a dedifferentiated  endometrial carcinoma.   Of note, the carcinoma present in the lymph nodes is well differentiated endometrioid carcinoma and the metastatic deposit in the ovary is undifferentiated carcinoma. Carcinoma is present less than 1 mm from uterine corpus serosa (in the lower uterine segment).  The assessment of the paracervical soft tissue margins is challenging due to the disrupted nature of the specimen in this area, however tumor appears present less than 1 mm from paracervical soft tissue.  No definite parametrial invasion is identified.   Immunohistochemistry performed at [facility] (with appropriate controls) demonstrates the following immunoprofile in cells of interest: Well differentiated endometrioid carcinoma: CK AE1/AE3: Positive ER: Positive (>90%, strong staining) P53: Favor wild-type (see comment below)   Dedifferentiated carcinoma: CK AE1/AE3: Very rare cells, predominantly negative ER: Negative P53: Small focus of mutant type staining in a background of wild-type staining (see comment below)   P53 staining comment: In the well differentiated carcinoma component the staining pattern is predominantly wild-type although focally there is slightly increased levels of staining noted.  In the dedifferentiated tumor component, most of the tissue exhibits wild-type pattern staining, however there is a distinct small area that exhibits increased/overexpressed mutant pattern p53 staining.    Immunohistochemistry testing for mismatch repair (MMR) proteins is performed on block A2:   MLH1:  Loss of nuclear expression MSH2:  Intact nuclear expression MSH6:  Intact nuclear expression PMS2:  Loss of nuclear expression   Loss of nuclear expression of MLH1 and PMS2 can be a result of sporadic microsatellite instability (via hypermethylation of the MLH1 promoter region) or due to Lynch syndrome / [place]. Materials will be submitted to the [facility]ology Laboratory [phone]) for [facility] promoter  hypermethylation testing. These results will be issued in a separate report.      03/05/2021 Initial Diagnosis   Uterine cancer ([place])   03/05/2021 Cancer Staging   Staging form: Corpus Uteri - Carcinoma and Carcinosarcoma, AJCC 8th Edition - Pathologic stage from 03/05/2021: Stage IIIC2 (pT3, pN2a, cM0) - Signed by Candice Lark, MD on 03/05/2021 Stage prefix: Initial diagnosis   03/11/2021 Imaging   1. Status post interval hysterectomy and oophorectomy. 2. There is a rim enhancing soft tissue mass centered in the vagina, measuring approximately 4.4 x 3.1 cm. This is new, or significantly enlarged compared to prior examination dated 01/05/2021. Findings are concerning for malignant involvement. Correlate with physical examination. Contrast enhanced MRI may be helpful to further detail soft tissue anatomy of the low pelvis if indicated. 3. There is at least one newly enlarged left iliac lymph node measuring up to 1.4 x 1.3 cm, nonspecific although concerning for nodal metastatic disease. 4. Fluid attenuation lymphocele or seroma adjacent to surgical clip at the level right ovarian vein. 5. Status post interval midline laparotomy. Subcutaneous fluid within the inferior aspect of the wound. The presence or absence of infection is not established by CT.     03/23/2021 -  Chemotherapy    Patient is on Treatment Plan: UTERINE CARBOPLATIN AUC 6 / PACLITAXEL Q21D         PHYSICAL EXAMINATION: ECOG PERFORMANCE STATUS: 0 - Asymptomatic  Vitals:   03/22/21 0934  BP: 124/73  Pulse: 79  Resp: 18  Temp: 98 F (36.7 C)  SpO2: 100%   Filed Weights   03/22/21  0383  Weight: 152 lb 12.8 oz (69.3 kg)    GENERAL:alert, no distress and comfortable SKIN: skin color, texture, turgor are normal, no rashes or significant lesions EYES: normal, Conjunctiva are pink and non-injected, sclera clear OROPHARYNX:no exudate, no erythema and lips, buccal mucosa, and tongue normal  NECK: supple, thyroid normal  size, non-tender, without nodularity LYMPH:  no palpable lymphadenopathy in the cervical, axillary or inguinal LUNGS: clear to auscultation and percussion with normal breathing effort HEART: regular rate & rhythm and no murmurs and no lower extremity edema ABDOMEN:abdomen soft, non-tender and normal bowel sounds Musculoskeletal:no cyanosis of digits and no clubbing  NEURO: alert & oriented x 3 with fluent speech, no focal motor/sensory deficits  LABORATORY DATA:  I have reviewed the data as listed    Component Value Date/Time   NA 141 03/22/2021 0923   K 4.2 03/22/2021 0923   CL 107 03/22/2021 0923   CO2 26 03/22/2021 0923   GLUCOSE 97 03/22/2021 0923   BUN 20 03/22/2021 0923   CREATININE 0.71 03/22/2021 0923   CALCIUM 9.6 03/22/2021 0923   PROT 7.3 03/22/2021 0923   ALBUMIN 3.5 03/22/2021 0923   AST 17 03/22/2021 0923   ALT 20 03/22/2021 0923   ALKPHOS 101 03/22/2021 0923   BILITOT 0.3 03/22/2021 0923   GFRNONAA >60 03/22/2021 0923    No results found for: SPEP, UPEP  Lab Results  Component Value Date   WBC 6.7 03/22/2021   NEUTROABS 4.7 03/22/2021   HGB 8.8 (L) 03/22/2021   HCT 29.8 (L) 03/22/2021   MCV 73.4 (L) 03/22/2021   PLT 371 03/22/2021      Chemistry      Component Value Date/Time   NA 141 03/22/2021 0923   K 4.2 03/22/2021 0923   CL 107 03/22/2021 0923   CO2 26 03/22/2021 0923   BUN 20 03/22/2021 0923   CREATININE 0.71 03/22/2021 0923      Component Value Date/Time   CALCIUM 9.6 03/22/2021 0923   ALKPHOS 101 03/22/2021 0923   AST 17 03/22/2021 0923   ALT 20 03/22/2021 0923   BILITOT 0.3 03/22/2021 0923       RADIOGRAPHIC STUDIES: I have reviewed multiple imaging studies with the patient I have personally reviewed the radiological images as listed and agreed with the findings in the report. CT Abdomen Pelvis W Contrast  Result Date: 03/12/2021 CLINICAL DATA:  Endometrial cancer, treatment planning EXAM: CT ABDOMEN AND PELVIS WITH CONTRAST  TECHNIQUE: Multidetector CT imaging of the abdomen and pelvis was performed using the standard protocol following bolus administration of intravenous contrast. CONTRAST:  37mL OMNIPAQUE IOHEXOL 350 MG/ML SOLN, additional oral enteric contrast COMPARISON:  CT abdomen pelvis, 01/05/2021 FINDINGS: Lower chest: No acute abnormality. Hepatobiliary: No solid liver abnormality is seen. No gallstones, gallbladder wall thickening, or biliary dilatation. Pancreas: Unremarkable. No pancreatic ductal dilatation or surrounding inflammatory changes. Spleen: Normal in size without significant abnormality. Adrenals/Urinary Tract: Adrenal glands are unremarkable. Kidneys are normal, without renal calculi, solid lesion, or hydronephrosis. Bladder is unremarkable. Stomach/Bowel: Stomach is within normal limits. Appendix appears normal. No evidence of bowel wall thickening, distention, or inflammatory changes. Vascular/Lymphatic: Aortic atherosclerosis. There is at least one new newly enlarged left iliac lymph node measuring up to 1.4 x 1.3 cm (series 2, image 55). Reproductive: Status post interval hysterectomy and oophorectomy. There is a rim enhancing soft tissue mass centered in the vagina, measuring approximately 4.4 x 3.1 cm (series 2, image 84). This is new, or significantly enlarged compared  to prior examination dated 01/05/2021. Other: Status post interval midline laparotomy. Subcutaneous fluid within the inferior aspect of the wound (series 2, image 59). No abdominopelvic ascites. Fluid attenuation lymphocele or seroma adjacent to surgical clip at the level right ovarian vein, measuring 4.4 x 3.4 cm (series 2, image 43). Musculoskeletal: No acute or significant osseous findings. IMPRESSION: 1. Status post interval hysterectomy and oophorectomy. 2. There is a rim enhancing soft tissue mass centered in the vagina, measuring approximately 4.4 x 3.1 cm. This is new, or significantly enlarged compared to prior examination dated  01/05/2021. Findings are concerning for malignant involvement. Correlate with physical examination. Contrast enhanced MRI may be helpful to further detail soft tissue anatomy of the low pelvis if indicated. 3. There is at least one newly enlarged left iliac lymph node measuring up to 1.4 x 1.3 cm, nonspecific although concerning for nodal metastatic disease. 4. Fluid attenuation lymphocele or seroma adjacent to surgical clip at the level right ovarian vein. 5. Status post interval midline laparotomy. Subcutaneous fluid within the inferior aspect of the wound. The presence or absence of infection is not established by CT. Aortic Atherosclerosis (ICD10-I70.0). Electronically Signed   By: Eddie Candle M.D.   On: 03/12/2021 15:45

## 2021-03-22 NOTE — Progress Notes (Signed)
Pt is approved for the $1000 [employer] grant.  

## 2021-03-22 NOTE — Assessment & Plan Note (Signed)
I have reviewed CT imaging with her She had postoperative changes The new lymphadenopathy could be related to postop versus disease, unknown She will pursue treatment tomorrow as scheduled She will pursue port placement today I reviewed with her common side effects and reminded her to take premedication I will see her prior to cycle 2 of treatment

## 2021-03-22 NOTE — Procedures (Signed)
Interventional Radiology Procedure:   Indications: Endometrial cancer  Procedure: Port placement  Findings: Right jugular port, tip at SVC/RA junction  Complications: None     EBL: Minimal, less than 10 ml  Plan: Discharge in one hour.  Keep port site and incisions dry for at least 24 hours.     Batoul Limes R. Kazmir Oki, MD  Pager: [phone]   

## 2021-03-23 ENCOUNTER — Inpatient Hospital Stay: Payer: Medicare Other

## 2021-03-23 VITALS — BP 130/71 | HR 72 | Temp 98.2°F | Resp 18 | Wt 148.1 lb

## 2021-03-23 DIAGNOSIS — C55 Malignant neoplasm of uterus, part unspecified: Secondary | ICD-10-CM | POA: Diagnosis not present

## 2021-03-23 MED ORDER — DIPHENHYDRAMINE HCL 50 MG/ML IJ SOLN
25.0000 mg | Freq: Once | INTRAMUSCULAR | Status: AC
  Administered 2021-03-23: 25 mg via INTRAVENOUS
  Filled 2021-03-23: qty 1

## 2021-03-23 MED ORDER — CYANOCOBALAMIN 1000 MCG/ML IJ SOLN: 1000.0000 ug | Freq: Once | INTRAMUSCULAR | Status: AC

## 2021-03-23 MED ORDER — FAMOTIDINE 20 MG IN NS 100 ML IVPB
INTRAVENOUS | Status: AC
  Administered 2021-03-23: 20 mg via INTRAVENOUS
  Filled 2021-03-23: qty 100

## 2021-03-23 MED ORDER — HEPARIN SOD (PORK) LOCK FLUSH 100 UNIT/ML IV SOLN
500.0000 [IU] | Freq: Once | INTRAVENOUS | Status: AC | PRN
  Administered 2021-03-23: 500 [IU]

## 2021-03-23 MED ORDER — PALONOSETRON HCL INJECTION 0.25 MG/5ML: 0.2500 mg | Freq: Once | INTRAVENOUS | Status: AC

## 2021-03-23 MED ORDER — PALONOSETRON HCL INJECTION 0.25 MG/5ML
INTRAVENOUS | Status: AC
  Administered 2021-03-23: 0.25 mg via INTRAVENOUS
  Filled 2021-03-23: qty 5

## 2021-03-23 MED ORDER — SODIUM CHLORIDE 0.9 % IV SOLN
150.0000 mg | Freq: Once | INTRAVENOUS | Status: AC
  Administered 2021-03-23: 150 mg via INTRAVENOUS
  Filled 2021-03-23: qty 150

## 2021-03-23 MED ORDER — FAMOTIDINE 20 MG IN NS 100 ML IVPB: 20.0000 mg | Freq: Once | INTRAVENOUS | Status: AC

## 2021-03-23 MED ORDER — IRON SUCROSE 20 MG/ML IV SOLN
300.0000 mg | Freq: Once | INTRAVENOUS | Status: AC
  Administered 2021-03-23: 300 mg via INTRAVENOUS
  Filled 2021-03-23: qty 300

## 2021-03-23 MED ORDER — SODIUM CHLORIDE 0.9% FLUSH
10.0000 mL | INTRAVENOUS | Status: DC | PRN
  Administered 2021-03-23: 10 mL

## 2021-03-23 MED ORDER — SODIUM CHLORIDE 0.9 % IV SOLN: Freq: Once | INTRAVENOUS | Status: AC

## 2021-03-23 MED ORDER — SODIUM CHLORIDE 0.9 % IV SOLN
175.0000 mg/m2 | Freq: Once | INTRAVENOUS | Status: AC
  Administered 2021-03-23: 306 mg via INTRAVENOUS
  Filled 2021-03-23: qty 51

## 2021-03-23 MED ORDER — SODIUM CHLORIDE 0.9 % IV SOLN
498.6000 mg | Freq: Once | INTRAVENOUS | Status: AC
  Administered 2021-03-23: 500 mg via INTRAVENOUS
  Filled 2021-03-23: qty 50

## 2021-03-23 MED ORDER — CYANOCOBALAMIN 1000 MCG/ML IJ SOLN
INTRAMUSCULAR | Status: AC
  Administered 2021-03-23: 1000 ug via INTRAMUSCULAR
  Filled 2021-03-23: qty 1

## 2021-03-23 MED ORDER — SODIUM CHLORIDE 0.9 % IV SOLN
10.0000 mg | Freq: Once | INTRAVENOUS | Status: AC
  Administered 2021-03-23: 10 mg via INTRAVENOUS
  Filled 2021-03-23: qty 10

## 2021-03-23 NOTE — Patient Instructions (Addendum)
[facility]  Discharge Instructions: Thank you for choosing [facility] to provide your oncology and hematology care.   If you have a lab appointment with the [facility], please go directly to the [facility] and check in at the registration area.   Wear comfortable clothing and clothing appropriate for easy access to any Portacath or PICC line.   We strive to give you quality time with your provider. You may need to reschedule your appointment if you arrive late (15 or more minutes).  Arriving late affects you and other patients whose appointments are after yours.  Also, if you miss three or more appointments without notifying the office, you may be dismissed from the clinic at the provider's discretion.      For prescription refill requests, have your pharmacy contact our office and allow 72 hours for refills to be completed.    Today you received the following chemotherapy and/or immunotherapy agents Paclitaxel and Carboplatin      To help prevent nausea and vomiting after your treatment, we encourage you to take your nausea medication as directed.  BELOW ARE SYMPTOMS THAT SHOULD BE REPORTED IMMEDIATELY: *FEVER GREATER THAN 100.4 F (38 C) OR HIGHER *CHILLS OR SWEATING *NAUSEA AND VOMITING THAT IS NOT CONTROLLED WITH YOUR NAUSEA MEDICATION *UNUSUAL SHORTNESS OF BREATH *UNUSUAL BRUISING OR BLEEDING *URINARY PROBLEMS (pain or burning when urinating, or frequent urination) *BOWEL PROBLEMS (unusual diarrhea, constipation, pain near the anus) TENDERNESS IN MOUTH AND THROAT WITH OR WITHOUT PRESENCE OF ULCERS (sore throat, sores in mouth, or a toothache) UNUSUAL RASH, SWELLING OR PAIN  UNUSUAL VAGINAL DISCHARGE OR ITCHING   Items with * indicate a potential emergency and should be followed up as soon as possible or go to the Emergency Department if any problems should occur.  Please show the CHEMOTHERAPY ALERT CARD or IMMUNOTHERAPY ALERT CARD  at check-in to the Emergency Department and triage nurse.  Should you have questions after your visit or need to cancel or reschedule your appointment, please contact [facility]  Dept: [phone]  and follow the prompts.  Office hours are 8:00 a.m. to 4:30 p.m. Monday - Friday. Please note that voicemails left after 4:00 p.m. may not be returned until the following business day.  We are closed weekends and major holidays. You have access to a nurse at all times for urgent questions. Please call the main number to the clinic Dept: [phone] and follow the prompts.   For any non-urgent questions, you may also contact your provider using MyChart. We now offer e-Visits for anyone 56 and older to request care online for non-urgent symptoms. For details visit [email or web address].   Also download the MyChart app! Go to the app store, search "MyChart", open the app, select [facility], and log in with your MyChart username and password.  Due to Covid, a mask is required upon entering the hospital/clinic. If you do not have a mask, one will be given to you upon arrival. For doctor visits, patients may have 1 support person aged 90 or older with them. For treatment visits, patients cannot have anyone with them due to current Covid guidelines and our immunocompromised population.   Paclitaxel injection What is this medication? PACLITAXEL (PAK li TAX el) is a chemotherapy drug. It targets fast dividing cells, like cancer cells, and causes these cells to die. This medicine is used to treat ovarian cancer, breast cancer, lung cancer, Kaposi's sarcoma, and other cancers. This  medicine may be used for other purposes; ask your health care provider or pharmacist if you have questions. COMMON BRAND NAME(S): Onxol, Taxol What should I tell my care team before I take this medication? They need to know if you have any of these conditions: history of irregular heartbeat liver  disease low blood counts, like low white cell, platelet, or red cell counts lung or breathing disease, like asthma tingling of the fingers or toes, or other nerve disorder an unusual or allergic reaction to paclitaxel, alcohol, polyoxyethylated castor oil, other chemotherapy, other medicines, foods, dyes, or preservatives pregnant or trying to get pregnant breast-feeding How should I use this medication? This drug is given as an infusion into a vein. It is administered in a hospital or clinic by a specially trained health care professional. Talk to your pediatrician regarding the use of this medicine in children. Special care may be needed. Overdosage: If you think you have taken too much of this medicine contact a poison control center or emergency room at once. NOTE: This medicine is only for you. Do not share this medicine with others. What if I miss a dose? It is important not to miss your dose. Call your doctor or health care professional if you are unable to keep an appointment. What may interact with this medication? Do not take this medicine with any of the following medications: live virus vaccines This medicine may also interact with the following medications: antiviral medicines for hepatitis, HIV or AIDS certain antibiotics like erythromycin and clarithromycin certain medicines for fungal infections like ketoconazole and itraconazole certain medicines for seizures like carbamazepine, phenobarbital, phenytoin gemfibrozil nefazodone rifampin St. John's wort This list may not describe all possible interactions. Give your health care provider a list of all the medicines, herbs, non-prescription drugs, or dietary supplements you use. Also tell them if you smoke, drink alcohol, or use illegal drugs. Some items may interact with your medicine. What should I watch for while using this medication? Your condition will be monitored carefully while you are receiving this medicine. You  will need important blood work done while you are taking this medicine. This medicine can cause serious allergic reactions. To reduce your risk you will need to take other medicine(s) before treatment with this medicine. If you experience allergic reactions like skin rash, itching or hives, swelling of the face, lips, or tongue, tell your doctor or health care professional right away. In some cases, you may be given additional medicines to help with side effects. Follow all directions for their use. This drug may make you feel generally unwell. This is not uncommon, as chemotherapy can affect healthy cells as well as cancer cells. Report any side effects. Continue your course of treatment even though you feel ill unless your doctor tells you to stop. Call your doctor or health care professional for advice if you get a fever, chills or sore throat, or other symptoms of a cold or flu. Do not treat yourself. This drug decreases your body's ability to fight infections. Try to avoid being around people who are sick. This medicine may increase your risk to bruise or bleed. Call your doctor or health care professional if you notice any unusual bleeding. Be careful brushing and flossing your teeth or using a toothpick because you may get an infection or bleed more easily. If you have any dental work done, tell your dentist you are receiving this medicine. Avoid taking products that contain aspirin, acetaminophen, ibuprofen, naproxen, or ketoprofen unless instructed  by your doctor. These medicines may hide a fever. Do not become pregnant while taking this medicine. Women should inform their doctor if they wish to become pregnant or think they might be pregnant. There is a potential for serious side effects to an unborn child. Talk to your health care professional or pharmacist for more information. Do not breast-feed an infant while taking this medicine. Men are advised not to father a child while receiving this  medicine. This product may contain alcohol. Ask your pharmacist or healthcare provider if this medicine contains alcohol. Be sure to tell all healthcare providers you are taking this medicine. Certain medicines, like metronidazole and disulfiram, can cause an unpleasant reaction when taken with alcohol. The reaction includes flushing, headache, nausea, vomiting, sweating, and increased thirst. The reaction can last from 30 minutes to several hours. What side effects may I notice from receiving this medication? Side effects that you should report to your doctor or health care professional as soon as possible: allergic reactions like skin rash, itching or hives, swelling of the face, lips, or tongue breathing problems changes in vision fast, irregular heartbeat high or low blood pressure mouth sores pain, tingling, numbness in the hands or feet signs of decreased platelets or bleeding - bruising, pinpoint red spots on the skin, black, tarry stools, blood in the urine signs of decreased red blood cells - unusually weak or tired, feeling faint or lightheaded, falls signs of infection - fever or chills, cough, sore throat, pain or difficulty passing urine signs and symptoms of liver injury like dark yellow or brown urine; general ill feeling or flu-like symptoms; light-colored stools; loss of appetite; nausea; right upper belly pain; unusually weak or tired; yellowing of the eyes or skin swelling of the ankles, feet, hands unusually slow heartbeat Side effects that usually do not require medical attention (report to your doctor or health care professional if they continue or are bothersome): diarrhea hair loss loss of appetite muscle or joint pain nausea, vomiting pain, redness, or irritation at site where injected tiredness This list may not describe all possible side effects. Call your doctor for medical advice about side effects. You may report side effects to FDA at 1-800-FDA-1088. Where  should I keep my medication? This drug is given in a hospital or clinic and will not be stored at home. NOTE: This sheet is a summary. It may not cover all possible information. If you have questions about this medicine, talk to your doctor, pharmacist, or health care provider.  2022 Elsevier/Gold Standard (2019-05-29 13:37:23)  Carboplatin injection What is this medication? CARBOPLATIN (KAR boe pla tin) is a chemotherapy drug. It targets fast dividing cells, like cancer cells, and causes these cells to die. This medicine is used to treat ovarian cancer and many other cancers. This medicine may be used for other purposes; ask your health care provider or pharmacist if you have questions. COMMON BRAND NAME(S): Paraplatin What should I tell my care team before I take this medication? They need to know if you have any of these conditions: blood disorders hearing problems kidney disease recent or ongoing radiation therapy an unusual or allergic reaction to carboplatin, cisplatin, other chemotherapy, other medicines, foods, dyes, or preservatives pregnant or trying to get pregnant breast-feeding How should I use this medication? This drug is usually given as an infusion into a vein. It is administered in a hospital or clinic by a specially trained health care professional. Talk to your pediatrician regarding the use of this medicine  in children. Special care may be needed. Overdosage: If you think you have taken too much of this medicine contact a poison control center or emergency room at once. NOTE: This medicine is only for you. Do not share this medicine with others. What if I miss a dose? It is important not to miss a dose. Call your doctor or health care professional if you are unable to keep an appointment. What may interact with this medication? medicines for seizures medicines to increase blood counts like filgrastim, pegfilgrastim, sargramostim some antibiotics like amikacin,  gentamicin, neomycin, streptomycin, tobramycin vaccines Talk to your doctor or health care professional before taking any of these medicines: acetaminophen aspirin ibuprofen ketoprofen naproxen This list may not describe all possible interactions. Give your health care provider a list of all the medicines, herbs, non-prescription drugs, or dietary supplements you use. Also tell them if you smoke, drink alcohol, or use illegal drugs. Some items may interact with your medicine. What should I watch for while using this medication? Your condition will be monitored carefully while you are receiving this medicine. You will need important blood work done while you are taking this medicine. This drug may make you feel generally unwell. This is not uncommon, as chemotherapy can affect healthy cells as well as cancer cells. Report any side effects. Continue your course of treatment even though you feel ill unless your doctor tells you to stop. In some cases, you may be given additional medicines to help with side effects. Follow all directions for their use. Call your doctor or health care professional for advice if you get a fever, chills or sore throat, or other symptoms of a cold or flu. Do not treat yourself. This drug decreases your body's ability to fight infections. Try to avoid being around people who are sick. This medicine may increase your risk to bruise or bleed. Call your doctor or health care professional if you notice any unusual bleeding. Be careful brushing and flossing your teeth or using a toothpick because you may get an infection or bleed more easily. If you have any dental work done, tell your dentist you are receiving this medicine. Avoid taking products that contain aspirin, acetaminophen, ibuprofen, naproxen, or ketoprofen unless instructed by your doctor. These medicines may hide a fever. Do not become pregnant while taking this medicine. Women should inform their doctor if they wish  to become pregnant or think they might be pregnant. There is a potential for serious side effects to an unborn child. Talk to your health care professional or pharmacist for more information. Do not breast-feed an infant while taking this medicine. What side effects may I notice from receiving this medication? Side effects that you should report to your doctor or health care professional as soon as possible: allergic reactions like skin rash, itching or hives, swelling of the face, lips, or tongue signs of infection - fever or chills, cough, sore throat, pain or difficulty passing urine signs of decreased platelets or bleeding - bruising, pinpoint red spots on the skin, black, tarry stools, nosebleeds signs of decreased red blood cells - unusually weak or tired, fainting spells, lightheadedness breathing problems changes in hearing changes in vision chest pain high blood pressure low blood counts - This drug may decrease the number of white blood cells, red blood cells and platelets. You may be at increased risk for infections and bleeding. nausea and vomiting pain, swelling, redness or irritation at the injection site pain, tingling, numbness in the hands or  feet problems with balance, talking, walking trouble passing urine or change in the amount of urine Side effects that usually do not require medical attention (report to your doctor or health care professional if they continue or are bothersome): hair loss loss of appetite metallic taste in the mouth or changes in taste This list may not describe all possible side effects. Call your doctor for medical advice about side effects. You may report side effects to FDA at 1-800-FDA-1088. Where should I keep my medication? This drug is given in a hospital or clinic and will not be stored at home. NOTE: This sheet is a summary. It may not cover all possible information. If you have questions about this medicine, talk to your doctor, pharmacist,  or health care provider.  2022 Elsevier/Gold Standard (2007-10-02 14:38:05)  Rehydration, Adult Rehydration is the replacement of body fluids, salts, and minerals (electrolytes) that are lost during dehydration. Dehydration is when there is not enough water or other fluids in the body. This happens when you lose more fluids than you take in. Common causes of dehydration include: Not drinking enough fluids. This can occur when you are ill or doing activities that require a lot of energy, especially in hot weather. Conditions that cause loss of water or other fluids, such as diarrhea, vomiting, sweating, or urinating a lot. Other illnesses, such as fever or infection. Certain medicines, such as those that remove excess fluid from the body (diuretics). Symptoms of mild or moderate dehydration may include thirst, dry lips and mouth, and dizziness. Symptoms of severe dehydration may include increased heart rate, confusion, fainting, and not urinating. For severe dehydration, you may need to get fluids through an IV at the hospital. For mild or moderate dehydration, you can usually rehydrate at home by drinking certain fluids as told by your health care provider. What are the risks? Generally, rehydration is safe. However, taking in too much fluid (overhydration) can be a problem. This is rare. Overhydration can cause an electrolyte imbalance, kidney failure, or a decrease in salt (sodium) levels in the body. Supplies needed You will need an oral rehydration solution (ORS) if your health care provider tells you to use one. This is a drink to treat dehydration. It can be found in pharmacies and retail stores. How to rehydrate Fluids Follow instructions from your health care provider for rehydration. The kind of fluid and the amount you should drink depend on your condition. In general, you should choose drinks that you prefer. If told by your health care provider, drink an ORS. Make an ORS by following  instructions on the package. Start by drinking small amounts, about  cup (120 mL) every 5-10 minutes. Slowly increase how much you drink until you have taken the amount recommended by your health care provider. Drink enough clear fluids to keep your urine pale yellow. If you were told to drink an ORS, finish it first, then start slowly drinking other clear fluids. Drink fluids such as: Water. This includes sparkling water and flavored water. Drinking only water can lead to having too little sodium in your body (hyponatremia). Follow the advice of your health care provider. Water from ice chips you suck on. Fruit juice with water you add to it (diluted). Sports drinks. Hot or cold herbal teas. Broth-based soups. Milk or milk products. Food Follow instructions from your health care provider about what to eat while you rehydrate. Your health care provider may recommend that you slowly begin eating regular foods in small amounts.  Eat foods that contain a healthy balance of electrolytes, such as bananas, oranges, potatoes, tomatoes, and spinach. Avoid foods that are greasy or contain a lot of sugar. In some cases, you may get nutrition through a feeding tube that is passed through your nose and into your stomach (nasogastric tube, or NG tube). This may be done if you have uncontrolled vomiting or diarrhea. Beverages to avoid Certain beverages may make dehydration worse. While you rehydrate, avoid drinking alcohol. How to tell if you are recovering from dehydration You may be recovering from dehydration if: You are urinating more often than before you started rehydrating. Your urine is pale yellow. Your energy level improves. You vomit less frequently. You have diarrhea less frequently. Your appetite improves or returns to normal. You feel less dizzy or less light-headed. Your skin tone and color start to look more normal. Follow these instructions at home: Take over-the-counter and  prescription medicines only as told by your health care provider. Do not take sodium tablets. Doing this can lead to having too much sodium in your body (hypernatremia). Contact a health care provider if: You continue to have symptoms of mild or moderate dehydration, such as: Thirst. Dry lips. Slightly dry mouth. Dizziness. Dark urine or less urine than normal. Muscle cramps. You continue to vomit or have diarrhea. Get help right away if you: Have symptoms of dehydration that get worse. Have a fever. Have a severe headache. Have been vomiting and the following happens: Your vomiting gets worse or does not go away. Your vomit includes blood or green matter (bile). You cannot eat or drink without vomiting. Have problems with urination or bowel movements, such as: Diarrhea that gets worse or does not go away. Blood in your stool (feces). This may cause stool to look black and tarry. Not urinating, or urinating only a small amount of very dark urine, within 6-8 hours. Have trouble breathing. Have symptoms that get worse with treatment. These symptoms may represent a serious problem that is an emergency. Do not wait to see if the symptoms will go away. Get medical help right away. Call your local emergency services (911 in the [place] Do not drive yourself to the hospital. Summary Rehydration is the replacement of body fluids and minerals (electrolytes) that are lost during dehydration. Follow instructions from your health care provider for rehydration. The kind of fluid and amount you should drink depend on your condition. Slowly increase how much you drink until you have taken the amount recommended by your health care provider. Contact your health care provider if you continue to show signs of mild or moderate dehydration. This information is not intended to replace advice given to you by your health care provider. Make sure you discuss any questions you have with your health care  provider. Document Revised: 08/28/2019 Document Reviewed: 07/08/2019 Elsevier Patient Education  2022 [place] and Nutrition What you eat both during and after your cancer treatment plays a big part in your recovery. During treatment, eating well helps you stay strong, provides you with energy for tissue healing, and helps you fight infection. It may also help with side effects. When treatment ends and you start feeling better, eating well helps you rebuild tissue, gain strength and energy, and feel better overall. Choosing what is best to eat and drink during and after treatment can be a challenge. If you need help, ask to be referred to a registered dietitian. A dietitian can help you create a balanced eating  plan. How to maintain healthy nutrition during cancer treatment What are tips for eating during treatment? During treatment, your tastes may change, so focus on foods that taste good to you. Eat your largest meal when you feel the most hungry. Limit drinking during the meal to increase food intake. Drink between meals to prevent dehydration. If you have lost your appetite or cannot keep food down, your cancer care team may suggest that you consume foods or drinks that have had nutrients added to them (are fortified) or are higher in calories, fat, and protein to help prevent weight loss. If you are still not able to meet your nutrition needs, you may benefit from other forms of nutrition support. Your care team may suggest appetite stimulants, tube feeding, or nutrition through an IV inserted into one of your veins. What foods should I eat during treatment?  Appetite is often altered during treatment. Some will have low appetite due to side effects of treatment. Others may gain weight due to medicines, less activity, and stress. Talk with a dietitian to help create the best eating plan for your situation. For a few meals each week, try eating plant-based foods that  are high in protein, such as beans and peas, instead of meat. Include lots of whole grains, fruits, and vegetables. Eat at least 2 cups of fruits and vegetables a day. Include fresh fruits and dark-green and deep-yellow vegetables, which are full of natural, healthy substances. Remember to wash fresh produce thoroughly before eating to reduce any risk for infection. Throughout the day, eat small snacks that are high in calories and protein. High-protein snacks that are easy to prepare and eat include: Yogurt. Cereal and milk. Half of a sandwich. A bowl of hearty soup. Cheese and crackers. Avoid snacks that could make side effects from treatment worse. For instance, popcorn, raw fruits, and raw vegetables can make diarrhea worse. Dry, coarse snacks or foods high in acid can hurt your throat if it is already sore. Drink enough fluid to keep your urine pale yellow. Talk with your health care provider about taking any vitamins and supplements, as high doses of some can alter the effectiveness of treatment therapy. How to maintain healthy nutrition after cancer treatment The goals of your nutrition after cancer treatment are to achieve and maintain a healthy weight and to be physically active on a regular basis. What are tips for eating after treatment? After cancer treatment, eat plenty of proteins, carbohydrates, and fats. You can get each of these dietary elements from a wide range of foods. You may also want to eat a diet rich in plant-based foods because those have been shown to be beneficial for cancer survivors. What foods should I eat after treatment? Here are some types of foods that you should include in your diet after treatment: Proteins. Protein is needed for growth, repairing body tissue and muscles, fighting infection, and helping to keep your body's defense system (immune system) healthy. When planning meals, choose lean proteins that do not have a lot of saturated fats. Include fish,  lean meat, skinless poultry, eggs, nonfat and low-fat dairy products, nuts, seeds, and legumes (like beans, peas, and lentils) in your daily diet. Carbohydrates. Carbohydrates are your body's main source of energy. They provide the fuel you need for physical activity and for your organs to work well. Most of the carbohydrates in your diet should come from foods that are high in essential nutrients and fiber, such as vegetables, fruits, whole grains, and legumes.  Fruits and vegetables. Eat at least 2-3 cups of vegetables and 1-2 cups of fruits each day. You may choose to eat them fresh, frozen, canned, raw, cooked, or dried. If you want to heat the fruits or vegetables, consider microwaving or steaming them rather than boiling them in water. Microwaving and steaming keep their nutrients better. Dairy. Choose low-fat milk and other low-fat dairy products. Fats and oils. Fats help boost energy in your body. Choose healthy fats from nuts, seeds, avocados, fatty fish, and vegetable oils, including olive, avocado, canola, peanut, safflower, grapeseed, and sesame. Beverages. All your body's cells need water to work well, so drink plenty of water. Drink about eight 8-oz glasses of liquid each day so your body's cells get the fluid they need. Keep in mind that foods such as soups, milk, and even ice cream count toward that goal. The items listed above may not be a complete list of foods and beverages you can eat. Contact a dietitian for more information. What foods and beverages should I avoid or limit? Maintaining a healthy weight is important for life after treatment. Check ingredients and nutrition facts on packaged foods and beverages. Talk with a registered dietitian to create a specific eating plan for you. Follow these recommendations for general healthy eating: Avoid beverages that are high in sugar, including juices. Avoiding these will help you stay at a healthy weight and help with blood sugar  control. Limit high-fat foods, including fried foods and foods that come from animals. An example of a high-fat food from an animal is red meat, processed meats, or full-fat dairy. If you have diarrhea from treatment, fatty foods can make it worse. Limit foods that are high in added sugar, such as cookies, baked goods, and candy. Limit salt-cured, smoked, and pickled foods. Avoid alcohol. There is a link between alcohol and the risk of certain cancers. If you choose to drink alcohol, it is best to limit intake to 1 drink a day for nonpregnant women and 2 drinks a day for men. In the [place] one drink equals one 12 oz bottle of beer (355 mL), one 5 oz glass of wine (148 mL), or one 1 oz glass of hard liquor (44 mL). Depending on your cancer diagnosis, your health care provider may recommend that you do not drink alcohol at all. Limit foods that are high in saturated fats, such as cheese and butter. Saturated fats may raise your risk of cancer. Limit processed foods, such as packaged meals and snacks. The items listed above may not be a complete list of foods and beverages you should avoid. Contact a dietitian for more information. Summary Good nutrition plays a big part in healing and keeping your body strong during and after cancer treatment. Ask to meet with a registered dietitian. A dietitian can help you create an eating plan that works well for you. During treatment, it may be helpful to eat small meals and snacks that are high in calories and protein. After cancer treatment, eat plenty of proteins, carbohydrates, and fats. You may also want to eat a diet rich in plant-based foods because those have been shown to be beneficial for cancer survivors. This information is not intended to replace advice given to you by your health care provider. Make sure you discuss any questions you have with your health care provider. Document Revised: 05/14/2019 Document Reviewed: 05/14/2019 Elsevier Patient  Education  [place].

## 2021-03-31 ENCOUNTER — Telehealth: Payer: Self-pay | Admitting: *Deleted

## 2021-03-31 NOTE — Telephone Encounter (Signed)
This RN spoke with pt late today per her call stating " what do I do about this dressing on my port a cath "  She states she has a " clear plastic dressing" on the site of incision.   Port was placed 03/22/2021.  She described dressing as a 4x4 inch clear plastic tape. She does not see steri strips under the dressing.  She does not have any complaints regarding the incision site " just don't know what I am supposed to do "  This RN discussed removing of dressing but pt states concern and is asking if she could come here for nurse to look at it and remove the dressing.  This RN informed pt we would be glad to look at the dressing and assist with removal per her concerns.  She will come in Thursday 9/22 and request nurse with Dr Alvy Bimler- this RN gave her name as well for contact if needed.

## 2021-04-01 ENCOUNTER — Telehealth: Payer: Self-pay | Admitting: *Deleted

## 2021-04-01 NOTE — Telephone Encounter (Signed)
Pt came in to this office this AM- per assessment by this RN pt had a tegaderm dressing over a 2x2 over her port site.  This RN gently removed dressing - port site was clean, dry and intact with noted surgical glue on incision area. Dressing was dry and clean  Site was not tender to touch and without any noted swelling, bruising or bleeding.  Pt understands she may gently clean the area without removing the glue ( let it flake off naturally ).  Port care discussed as per protocol.  Pt stated appreciation of above and this RN gave her written instructions per next visit and use of EMLA.

## 2021-04-09 ENCOUNTER — Telehealth: Payer: Self-pay

## 2021-04-09 MED FILL — Fosaprepitant Dimeglumine For IV Infusion 150 MG (Base Eq): INTRAVENOUS | Qty: 5 | Status: AC

## 2021-04-09 MED FILL — Dexamethasone Sodium Phosphate Inj 100 MG/10ML: INTRAMUSCULAR | Qty: 1 | Status: AC

## 2021-04-09 NOTE — Telephone Encounter (Signed)
Returned her call. Left a message asking for a call back. Left a message asking her to call the office back.

## 2021-04-12 ENCOUNTER — Inpatient Hospital Stay: Payer: Medicare Other | Attending: Gynecologic Oncology

## 2021-04-12 ENCOUNTER — Inpatient Hospital Stay (HOSPITAL_BASED_OUTPATIENT_CLINIC_OR_DEPARTMENT_OTHER): Payer: Medicare Other | Admitting: Hematology and Oncology

## 2021-04-12 ENCOUNTER — Other Ambulatory Visit: Payer: Self-pay | Admitting: Hematology and Oncology

## 2021-04-12 ENCOUNTER — Inpatient Hospital Stay: Payer: Medicare Other

## 2021-04-12 ENCOUNTER — Other Ambulatory Visit: Payer: Self-pay

## 2021-04-12 ENCOUNTER — Encounter: Payer: Self-pay | Admitting: Hematology and Oncology

## 2021-04-12 VITALS — BP 120/66 | HR 66 | Temp 98.1°F | Resp 17

## 2021-04-12 DIAGNOSIS — Z79899 Other long term (current) drug therapy: Secondary | ICD-10-CM | POA: Diagnosis not present

## 2021-04-12 DIAGNOSIS — C55 Malignant neoplasm of uterus, part unspecified: Secondary | ICD-10-CM | POA: Diagnosis not present

## 2021-04-12 DIAGNOSIS — Z5111 Encounter for antineoplastic chemotherapy: Secondary | ICD-10-CM | POA: Insufficient documentation

## 2021-04-12 DIAGNOSIS — Z7952 Long term (current) use of systemic steroids: Secondary | ICD-10-CM | POA: Diagnosis not present

## 2021-04-12 DIAGNOSIS — G609 Hereditary and idiopathic neuropathy, unspecified: Secondary | ICD-10-CM | POA: Insufficient documentation

## 2021-04-12 DIAGNOSIS — E538 Deficiency of other specified B group vitamins: Secondary | ICD-10-CM | POA: Insufficient documentation

## 2021-04-12 DIAGNOSIS — D539 Nutritional anemia, unspecified: Secondary | ICD-10-CM | POA: Diagnosis not present

## 2021-04-12 LAB — CBC WITH DIFFERENTIAL (CANCER CENTER ONLY)
Abs Immature Granulocytes: 0.01 10*3/uL (ref 0.00–0.07)
Basophils Absolute: 0 10*3/uL (ref 0.0–0.1)
Basophils Relative: 1 %
Eosinophils Absolute: 0.1 10*3/uL (ref 0.0–0.5)
Eosinophils Relative: 2 %
HCT: 28.2 % — ABNORMAL LOW (ref 36.0–46.0)
Hemoglobin: 8.4 g/dL — ABNORMAL LOW (ref 12.0–15.0)
Immature Granulocytes: 0 %
Lymphocytes Relative: 34 %
Lymphs Abs: 1.5 10*3/uL (ref 0.7–4.0)
MCH: 21.7 pg — ABNORMAL LOW (ref 26.0–34.0)
MCHC: 29.8 g/dL — ABNORMAL LOW (ref 30.0–36.0)
MCV: 72.9 fL — ABNORMAL LOW (ref 80.0–100.0)
Monocytes Absolute: 0.3 10*3/uL (ref 0.1–1.0)
Monocytes Relative: 6 %
Neutro Abs: 2.5 10*3/uL (ref 1.7–7.7)
Neutrophils Relative %: 57 %
Platelet Count: 410 10*3/uL — ABNORMAL HIGH (ref 150–400)
RBC: 3.87 MIL/uL (ref 3.87–5.11)
RDW: 19.9 % — ABNORMAL HIGH (ref 11.5–15.5)
WBC Count: 4.4 10*3/uL (ref 4.0–10.5)
nRBC: 0 % (ref 0.0–0.2)

## 2021-04-12 LAB — CMP (CANCER CENTER ONLY)
ALT: 15 U/L (ref 0–44)
AST: 11 U/L — ABNORMAL LOW (ref 15–41)
Albumin: 3.5 g/dL (ref 3.5–5.0)
Alkaline Phosphatase: 113 U/L (ref 38–126)
Anion gap: 7 (ref 5–15)
BUN: 12 mg/dL (ref 8–23)
CO2: 25 mmol/L (ref 22–32)
Calcium: 9.3 mg/dL (ref 8.9–10.3)
Chloride: 108 mmol/L (ref 98–111)
Creatinine: 0.64 mg/dL (ref 0.44–1.00)
GFR, Estimated: >60 mL/min (ref >60)
Glucose, Bld: 91 mg/dL (ref 70–99)
Potassium: 4.3 mmol/L (ref 3.5–5.1)
Sodium: 140 mmol/L (ref 135–145)
Total Bilirubin: 0.2 mg/dL — ABNORMAL LOW (ref 0.3–1.2)
Total Protein: 6.9 g/dL (ref 6.5–8.1)

## 2021-04-12 MED ORDER — SODIUM CHLORIDE 0.9 % IV SOLN
150.0000 mg | Freq: Once | INTRAVENOUS | Status: AC
  Administered 2021-04-12: 150 mg via INTRAVENOUS
  Filled 2021-04-12: qty 150

## 2021-04-12 MED ORDER — CYANOCOBALAMIN 1000 MCG/ML IJ SOLN: 1000.0000 ug | Freq: Once | INTRAMUSCULAR | Status: AC

## 2021-04-12 MED ORDER — SODIUM CHLORIDE 0.9 % IV SOLN
175.0000 mg/m2 | Freq: Once | INTRAVENOUS | Status: AC
  Administered 2021-04-12: 306 mg via INTRAVENOUS
  Filled 2021-04-12: qty 51

## 2021-04-12 MED ORDER — IRON SUCROSE 20 MG/ML IV SOLN
200.0000 mg | Freq: Once | INTRAVENOUS | Status: AC
  Administered 2021-04-12: 200 mg via INTRAVENOUS
  Filled 2021-04-12: qty 200

## 2021-04-12 MED ORDER — HEPARIN SOD (PORK) LOCK FLUSH 100 UNIT/ML IV SOLN
500.0000 [IU] | Freq: Once | INTRAVENOUS | Status: AC | PRN
  Administered 2021-04-12: 500 [IU]

## 2021-04-12 MED ORDER — FAMOTIDINE 20 MG IN NS 100 ML IVPB
20.0000 mg | Freq: Once | INTRAVENOUS | Status: AC
  Administered 2021-04-12: 20 mg via INTRAVENOUS
  Filled 2021-04-12: qty 100

## 2021-04-12 MED ORDER — CYANOCOBALAMIN 1000 MCG/ML IJ SOLN
INTRAMUSCULAR | Status: AC
  Administered 2021-04-12: 1000 ug via INTRAMUSCULAR
  Filled 2021-04-12: qty 1

## 2021-04-12 MED ORDER — SODIUM CHLORIDE 0.9% FLUSH
10.0000 mL | INTRAVENOUS | Status: DC | PRN
  Administered 2021-04-12: 10 mL

## 2021-04-12 MED ORDER — SODIUM CHLORIDE 0.9% FLUSH
10.0000 mL | Freq: Once | INTRAVENOUS | Status: AC
  Administered 2021-04-12: 10 mL

## 2021-04-12 MED ORDER — DIPHENHYDRAMINE HCL 50 MG/ML IJ SOLN
25.0000 mg | Freq: Once | INTRAMUSCULAR | Status: AC
  Administered 2021-04-12: 25 mg via INTRAVENOUS
  Filled 2021-04-12: qty 1

## 2021-04-12 MED ORDER — SODIUM CHLORIDE 0.9 % IV SOLN
10.0000 mg | Freq: Once | INTRAVENOUS | Status: AC
  Administered 2021-04-12: 10 mg via INTRAVENOUS
  Filled 2021-04-12: qty 10

## 2021-04-12 MED ORDER — PALONOSETRON HCL INJECTION 0.25 MG/5ML
0.2500 mg | Freq: Once | INTRAVENOUS | Status: AC
  Administered 2021-04-12: 0.25 mg via INTRAVENOUS
  Filled 2021-04-12: qty 5

## 2021-04-12 MED ORDER — SODIUM CHLORIDE 0.9 % IV SOLN
498.6000 mg | Freq: Once | INTRAVENOUS | Status: AC
  Administered 2021-04-12: 500 mg via INTRAVENOUS
  Filled 2021-04-12: qty 50

## 2021-04-12 MED ORDER — SODIUM CHLORIDE 0.9 % IV SOLN: Freq: Once | INTRAVENOUS | Status: AC

## 2021-04-12 NOTE — Assessment & Plan Note (Signed)
She has both combined B12 and iron deficiency She does not need blood transfusion I recommend we proceed with IV iron and another B12 injection today and she is in agreement

## 2021-04-12 NOTE — Assessment & Plan Note (Signed)
She had minor pre-existing peripheral neuropathy but could be exacerbated by B12 deficiency She did not feel worsening peripheral neuropathy We will proceed with chemotherapy without changes

## 2021-04-12 NOTE — Assessment & Plan Note (Addendum)
She felt better after recent treatment Her appetite is improved and she has gained some weight Her energy level is great We will proceed with treatment without delay

## 2021-04-12 NOTE — Progress Notes (Signed)
Proceed with Venofer 200mg  today due to time constraints in the treatment room. Venofer auth'd per PA team.  Acquanetta Belling, RPH, BCPS, BCOP 04/12/2021 11:19 AM

## 2021-04-12 NOTE — Progress Notes (Signed)
[facility] OFFICE PROGRESS NOTE  Patient Care Team: Default, Provider, MD as PCP - General  ASSESSMENT & PLAN:  Uterine cancer [facility]) She felt better after recent treatment Her appetite is improved and she has gained some weight Her energy level is great We will proceed with treatment without delay   Deficiency anemia She has both combined B12 and iron deficiency She does not need blood transfusion I recommend we proceed with IV iron and another B12 injection today and she is in agreement  Idiopathic neuropathy She had minor pre-existing peripheral neuropathy but could be exacerbated by B12 deficiency She did not feel worsening peripheral neuropathy We will proceed with chemotherapy without changes  No orders of the defined types were placed in this encounter.   All questions were answered. The patient knows to call the clinic with any problems, questions or concerns. The total time spent in the appointment was 20 minutes encounter with patients including review of chart and various tests results, discussions about plan of care and coordination of care plan   [name], MD 04/12/2021 3:29 PM  INTERVAL HISTORY: Please see below for problem oriented charting. she returns for treatment follow-up for cycle 2 of chemotherapy for uterine cancer She felt great She has gained some weight due to great appetite Denies worsening peripheral neuropathy She has good energy level  REVIEW OF SYSTEMS:   Constitutional: Denies fevers, chills or abnormal weight loss Eyes: Denies blurriness of vision Ears, nose, mouth, throat, and face: Denies mucositis or sore throat Respiratory: Denies cough, dyspnea or wheezes Cardiovascular: Denies palpitation, chest discomfort or lower extremity swelling Gastrointestinal:  Denies nausea, heartburn or change in bowel habits Skin: Denies abnormal skin rashes Lymphatics: Denies new lymphadenopathy or easy bruising Behavioral/Psych: Mood  is stable, no new changes  All other systems were reviewed with the patient and are negative.  I have reviewed the past medical history, past surgical history, social history and family history with the patient and they are unchanged from previous note.  ALLERGIES:  has No Known Allergies.  MEDICATIONS:  Current Outpatient Medications  Medication Sig Dispense Refill   dexamethasone (DECADRON) 4 MG tablet Take 2 tabs at the night before and 2 tab the morning of chemotherapy, every 3 weeks, by mouth x 6 cycles 36 tablet 6   lidocaine-prilocaine (EMLA) cream Apply to affected area once 30 g 3   ondansetron (ZOFRAN) 8 MG tablet Take 1 tablet (8 mg total) by mouth every 8 (eight) hours as needed. 30 tablet 1   prochlorperazine (COMPAZINE) 10 MG tablet Take 1 tablet (10 mg total) by mouth every 6 (six) hours as needed (Nausea or vomiting). 30 tablet 1   No current facility-administered medications for this visit.   Facility-Administered Medications Ordered in Other Visits  Medication Dose Route Frequency Provider Last Rate Last Admin   CARBOplatin (PARAPLATIN) 500 mg in sodium chloride 0.9 % 250 mL chemo infusion  500 mg Intravenous Once Alvy Bimler, Agnieszka Newhouse, MD       heparin lock flush 100 unit/mL  500 Units Intracatheter Once PRN Alvy Bimler, Elonda Giuliano, MD       PACLitaxel (TAXOL) 306 mg in sodium chloride 0.9 % 500 mL chemo infusion (> $RemoveBef'80mg'plVfjTGiqb$ /m2)  175 mg/m2 (Treatment Plan Recorded) Intravenous Once Alvy Bimler, Fabrizio Filip, MD 200 mL/hr at 04/12/21 1259 Infusion Verify at 04/12/21 1259   sodium chloride flush (NS) 0.9 % injection 10 mL  10 mL Intracatheter PRN [name], MD        SUMMARY OF  ONCOLOGIC HISTORY: Oncology History Overview Note  Endometrioid cancer, 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   Uterine cancer ([place])   01/05/2021 Imaging   Outside CT imaging  Bilateral hydronephrosis related extrinsic compression on the ureters by the enlarged uterus.   Multiple calcified uterine fibroids although peripherally enhancing diffuse decreased attenuation is noted centrally within the uterus highly suspicious for endometrial neoplasm given the current history of vaginal bleeding. Further evaluation is recommended. MRI may be helpful. Additionally tissue sampling would likely be helpful.   Hypodensity in the pancreas at the junction of the head and body of the pancreas. This may be related to volume averaging although the possibility of a cystic lesion could not be totally excluded. MRI is recommended for further evaluation.    01/05/2021 Initial Diagnosis   The patient reported at least 1 year of postmenopausal bleeding.  She does not have a primary care provider or gynecologist.  She had known fibroids and had noted that she needed a hysterectomy but had not gotten around to seeking care for this.  She presented to the [facility] emergency department on January 05, 2021 with new onset right lower quadrant pain and this prompted a CT scan of the abdomen and pelvis to be performed on 01/05/2021.     01/14/2021 Pathology Results   FINAL MICROSCOPIC DIAGNOSIS:   A. CERVIX, ENDOCERVIX, BIOPSY:  -  Endometrioid carcinoma, FIGO grade 1  -  See comment    01/26/2021 Imaging   MRI abdomen  1. No suspicious pancreatic mass identified. 2. Moderate to severe bilateral hydroureteronephrosis, similar to recent CT the abdomen and pelvis, presumably related to extrinsic ureteral compression from the patient's enlarged uterus, as demonstrated on recent CT the abdomen and pelvis 01/05/2021.   02/01/2021 Surgery   Surgery in [facility]  Date of Service: February 01, 2021 3:13 PM  Preoperative Diagnoses: Endometrial cancer  Postoperative Diagnoses: Endometrial cancer  Procedures: Total abdominal hysterectomy, bilateral salpingo-oophorectomy,  left pelvic lymphadenectomy, right para-aortic lymphadenectomy  Surgeon: Everitt Amber, MD  Findings: Tumor appreciated in the cervical canal. Large, broad, multi fibroid uterus with a calcified fibroid filling the posterior cul de sac, an anterior lower uterine segment fibroid, and bilateral fibroids near the level of the cardinal ligament. No evidence of intraperitoneal disease. Normal appearing bilateral tubes and ovaries. Bilateral hydroureter from uterine compression. Tumor excreted from specimen into the pelvis at time of removal. An approximately 2cm enlarged left pelvic lymph node and a 1cm enlarged right aorta caval lymph node were identified.     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) - Right ovary, negative for tumor - Bilateral fallopian tubes, negative for tumor - Multiple leiomyomata (up to 8.5 cm)   B: Lymph node, aortocaval, excision - Metastatic carcinoma involving 1 lymph node (0.4 cm) (1/1)   C: Lymph node, left pelvic, excision -Metastatic carcinoma involving 1 lymph node (0.3 cm) (1/1)   Diagnosis Comment    The majority of this tumor is composed of well differentiated (overall grade 2, but with areas consistent with  grade 1) endometrioid adenocarcinoma with squamous and mucinous differentiation (approximately 70%); multifocally there is abrupt transition to an undifferentiated malignancy with a high-grade monotonous noncohesive appearance (best seen in slide A2) consistent with undifferentiated carcinoma.  The overall morphologic and immunophenotypic findings are consistent with a dedifferentiated endometrial carcinoma.    Of note, the carcinoma present in the lymph nodes is well differentiated endometrioid carcinoma and the metastatic deposit in the ovary is undifferentiated carcinoma. Carcinoma is present less than 1 mm from uterine corpus serosa (in the lower uterine segment).  The assessment of the paracervical soft tissue margins is challenging due to the disrupted nature of the specimen in this area, however tumor appears present less than 1 mm from paracervical soft tissue.  No definite parametrial invasion is identified.   Immunohistochemistry performed at [facility] (with appropriate controls) demonstrates the following immunoprofile in cells of interest: Well differentiated endometrioid carcinoma: CK AE1/AE3: Positive ER: Positive (>90%, strong staining) P53: Favor wild-type (see comment below)   Dedifferentiated carcinoma: CK AE1/AE3: Very rare cells, predominantly negative ER: Negative P53: Small focus of mutant type staining in a background of wild-type staining (see comment below)   P53 staining comment: In the well differentiated carcinoma component the staining pattern is predominantly wild-type although focally there is slightly increased levels of staining noted.  In the dedifferentiated tumor component, most of the tissue exhibits wild-type pattern staining, however there is a distinct small area that exhibits increased/overexpressed mutant pattern p53 staining.    Immunohistochemistry testing for mismatch repair (MMR) proteins is performed on block A2:   MLH1:  Loss of nuclear expression MSH2:  Intact nuclear expression MSH6:  Intact nuclear expression PMS2:  Loss of nuclear expression   Loss of nuclear expression of MLH1 and PMS2 can be a result of sporadic microsatellite instability (via hypermethylation of the MLH1 promoter region) or due to Lynch syndrome / [place]. Materials will be submitted to the Molecular Pathology Laboratory [id number]) for [facility] promoter hypermethylation testing. These  results will be issued in a separate report.      03/05/2021 Initial Diagnosis   Uterine cancer ([place])   03/05/2021 Cancer Staging   Staging form: Corpus Uteri - Carcinoma and Carcinosarcoma, AJCC 8th Edition - Pathologic stage from 03/05/2021: Stage IIIC2 (pT3, pN2a, cM0) - Signed by [name], MD on 03/05/2021 Stage prefix: Initial diagnosis   03/11/2021 Imaging   1. Status post interval hysterectomy and oophorectomy. 2. There is a rim enhancing soft tissue mass centered in the vagina, measuring approximately 4.4 x 3.1 cm. This is new, or significantly enlarged compared to prior examination dated 01/05/2021. Findings are concerning for malignant involvement. Correlate with physical examination. Contrast enhanced MRI may be helpful to further detail soft tissue anatomy of the low pelvis if indicated. 3. There is at least one newly enlarged left iliac lymph node measuring up to 1.4 x 1.3 cm, nonspecific although concerning for nodal metastatic disease. 4. Fluid attenuation lymphocele or seroma adjacent to surgical clip at the level right ovarian vein. 5. Status post interval midline laparotomy. Subcutaneous fluid within the inferior aspect of the wound. The presence or absence of infection is not established by CT.     03/23/2021 -  Chemotherapy   Patient is on Treatment Plan : UTERINE Carboplatin AUC 6 / Paclitaxel q21d     03/23/2021 Procedure   Placement of a subcutaneous power-injectable port device. Catheter tip at the superior cavoatrial junction     PHYSICAL EXAMINATION: ECOG PERFORMANCE STATUS: 1 - Symptomatic but completely  ambulatory  Vitals:   04/12/21 1018  BP: 140/70  Pulse: 65  Resp: 18  SpO2: 100%   Filed Weights   04/12/21 1018  Weight: 156 lb 6.4 oz (70.9 kg)    GENERAL:alert, no distress and comfortable SKIN: skin color, texture, turgor are normal, no rashes or significant lesions EYES: normal, Conjunctiva are pink and non-injected, sclera clear OROPHARYNX:no  exudate, no erythema and lips, buccal mucosa, and tongue normal  NECK: supple, thyroid normal size, non-tender, without nodularity LYMPH:  no palpable lymphadenopathy in the cervical, axillary or inguinal LUNGS: clear to auscultation and percussion with normal breathing effort HEART: regular rate & rhythm and no murmurs and no lower extremity edema ABDOMEN:abdomen soft, non-tender and normal bowel sounds Musculoskeletal:no cyanosis of digits and no clubbing  NEURO: alert & oriented x 3 with fluent speech, no focal motor/sensory deficits  LABORATORY DATA:  I have reviewed the data as listed    Component Value Date/Time   NA 140 04/12/2021 0947   K 4.3 04/12/2021 0947   CL 108 04/12/2021 0947   CO2 25 04/12/2021 0947   GLUCOSE 91 04/12/2021 0947   BUN 12 04/12/2021 0947   CREATININE 0.64 04/12/2021 0947   CALCIUM 9.3 04/12/2021 0947   PROT 6.9 04/12/2021 0947   ALBUMIN 3.5 04/12/2021 0947   AST 11 (L) 04/12/2021 0947   ALT 15 04/12/2021 0947   ALKPHOS 113 04/12/2021 0947   BILITOT 0.2 (L) 04/12/2021 0947   GFRNONAA >60 04/12/2021 0947    No results found for: SPEP, UPEP  Lab Results  Component Value Date   WBC 4.4 04/12/2021   NEUTROABS 2.5 04/12/2021   HGB 8.4 (L) 04/12/2021   HCT 28.2 (L) 04/12/2021   MCV 72.9 (L) 04/12/2021   PLT 410 (H) 04/12/2021      Chemistry      Component Value Date/Time   NA 140 04/12/2021 0947   K 4.3 04/12/2021 0947   CL 108 04/12/2021 0947   CO2 25 04/12/2021 0947   BUN 12 04/12/2021 0947   CREATININE 0.64 04/12/2021 0947      Component Value Date/Time   CALCIUM 9.3 04/12/2021 0947   ALKPHOS 113 04/12/2021 0947   AST 11 (L) 04/12/2021 0947   ALT 15 04/12/2021 0947   BILITOT 0.2 (L) 04/12/2021 5859

## 2021-04-12 NOTE — Progress Notes (Signed)
19min Post observation performed after Venofer administration.  Pt remained WNL and at the time of discharge.

## 2021-04-12 NOTE — Patient Instructions (Signed)
[facility] ONCOLOGY  Discharge Instructions: Thank you for choosing [facility] to provide your oncology and hematology care.   If you have a lab appointment with the [facility], please go directly to the [facility] and check in at the registration area.   Wear comfortable clothing and clothing appropriate for easy access to any Portacath or PICC line.   We strive to give you quality time with your provider. You may need to reschedule your appointment if you arrive late (15 or more minutes).  Arriving late affects you and other patients whose appointments are after yours.  Also, if you miss three or more appointments without notifying the office, you may be dismissed from the clinic at the provider's discretion.      For prescription refill requests, have your pharmacy contact our office and allow 72 hours for refills to be completed.    Today you received the following chemotherapy and/or immunotherapy agents Paclitaxel, Carboplatin, and Venofer      To help prevent nausea and vomiting after your treatment, we encourage you to take your nausea medication as directed.  BELOW ARE SYMPTOMS THAT SHOULD BE REPORTED IMMEDIATELY: *FEVER GREATER THAN 100.4 F (38 C) OR HIGHER *CHILLS OR SWEATING *NAUSEA AND VOMITING THAT IS NOT CONTROLLED WITH YOUR NAUSEA MEDICATION *UNUSUAL SHORTNESS OF BREATH *UNUSUAL BRUISING OR BLEEDING *URINARY PROBLEMS (pain or burning when urinating, or frequent urination) *BOWEL PROBLEMS (unusual diarrhea, constipation, pain near the anus) TENDERNESS IN MOUTH AND THROAT WITH OR WITHOUT PRESENCE OF ULCERS (sore throat, sores in mouth, or a toothache) UNUSUAL RASH, SWELLING OR PAIN  UNUSUAL VAGINAL DISCHARGE OR ITCHING   Items with * indicate a potential emergency and should be followed up as soon as possible or go to the Emergency Department if any problems should occur.  Please show the CHEMOTHERAPY ALERT CARD or IMMUNOTHERAPY  ALERT CARD at check-in to the Emergency Department and triage nurse.  Should you have questions after your visit or need to cancel or reschedule your appointment, please contact [facility]  Dept: [phone]  and follow the prompts.  Office hours are 8:00 a.m. to 4:30 p.m. Monday - Friday. Please note that voicemails left after 4:00 p.m. may not be returned until the following business day.  We are closed weekends and major holidays. You have access to a nurse at all times for urgent questions. Please call the main number to the clinic Dept: [phone] and follow the prompts.   For any non-urgent questions, you may also contact your provider using MyChart. We now offer e-Visits for anyone 18 and older to request care online for non-urgent symptoms. For details visit [email or web address].   Also download the MyChart app! Go to the app store, search "MyChart", open the app, select [facility], and log in with your MyChart username and password.  Due to Covid, a mask is required upon entering the hospital/clinic. If you do not have a mask, one will be given to you upon arrival. For doctor visits, patients may have 1 support person aged 40 or older with them. For treatment visits, patients cannot have anyone with them due to current Covid guidelines and our immunocompromised population.

## 2021-04-30 MED FILL — Dexamethasone Sodium Phosphate Inj 100 MG/10ML: INTRAMUSCULAR | Qty: 1 | Status: AC

## 2021-04-30 MED FILL — Fosaprepitant Dimeglumine For IV Infusion 150 MG (Base Eq): INTRAVENOUS | Qty: 5 | Status: AC

## 2021-05-03 ENCOUNTER — Other Ambulatory Visit: Payer: Self-pay

## 2021-05-03 ENCOUNTER — Inpatient Hospital Stay (HOSPITAL_BASED_OUTPATIENT_CLINIC_OR_DEPARTMENT_OTHER): Payer: Medicare Other | Admitting: Hematology and Oncology

## 2021-05-03 ENCOUNTER — Inpatient Hospital Stay: Payer: Medicare Other

## 2021-05-03 ENCOUNTER — Encounter: Payer: Self-pay | Admitting: Hematology and Oncology

## 2021-05-03 VITALS — BP 141/72 | Temp 98.2°F

## 2021-05-03 DIAGNOSIS — C55 Malignant neoplasm of uterus, part unspecified: Secondary | ICD-10-CM | POA: Diagnosis not present

## 2021-05-03 DIAGNOSIS — G609 Hereditary and idiopathic neuropathy, unspecified: Secondary | ICD-10-CM | POA: Diagnosis not present

## 2021-05-03 DIAGNOSIS — D539 Nutritional anemia, unspecified: Secondary | ICD-10-CM | POA: Diagnosis not present

## 2021-05-03 LAB — CMP (CANCER CENTER ONLY)
ALT: 55 U/L — ABNORMAL HIGH (ref 0–44)
AST: 43 U/L — ABNORMAL HIGH (ref 15–41)
Albumin: 3.8 g/dL (ref 3.5–5.0)
Alkaline Phosphatase: 116 U/L (ref 38–126)
Anion gap: 5 (ref 5–15)
BUN: 15 mg/dL (ref 8–23)
CO2: 25 mmol/L (ref 22–32)
Calcium: 9.2 mg/dL (ref 8.9–10.3)
Chloride: 108 mmol/L (ref 98–111)
Creatinine: 0.57 mg/dL (ref 0.44–1.00)
GFR, Estimated: >60 mL/min (ref >60)
Glucose, Bld: 89 mg/dL (ref 70–99)
Potassium: 3.9 mmol/L (ref 3.5–5.1)
Sodium: 138 mmol/L (ref 135–145)
Total Bilirubin: 0.3 mg/dL (ref 0.3–1.2)
Total Protein: 7.2 g/dL (ref 6.5–8.1)

## 2021-05-03 LAB — CBC WITH DIFFERENTIAL (CANCER CENTER ONLY)
Abs Immature Granulocytes: 0 10*3/uL (ref 0.00–0.07)
Basophils Absolute: 0 10*3/uL (ref 0.0–0.1)
Basophils Relative: 1 %
Eosinophils Absolute: 0.1 10*3/uL (ref 0.0–0.5)
Eosinophils Relative: 3 %
HCT: 31.5 % — ABNORMAL LOW (ref 36.0–46.0)
Hemoglobin: 9.2 g/dL — ABNORMAL LOW (ref 12.0–15.0)
Immature Granulocytes: 0 %
Lymphocytes Relative: 53 %
Lymphs Abs: 1.6 10*3/uL (ref 0.7–4.0)
MCH: 21.2 pg — ABNORMAL LOW (ref 26.0–34.0)
MCHC: 29.2 g/dL — ABNORMAL LOW (ref 30.0–36.0)
MCV: 72.7 fL — ABNORMAL LOW (ref 80.0–100.0)
Monocytes Absolute: 0.3 10*3/uL (ref 0.1–1.0)
Monocytes Relative: 9 %
Neutro Abs: 1.1 10*3/uL — ABNORMAL LOW (ref 1.7–7.7)
Neutrophils Relative %: 34 %
Platelet Count: 216 10*3/uL (ref 150–400)
RBC: 4.33 MIL/uL (ref 3.87–5.11)
RDW: 19.1 % — ABNORMAL HIGH (ref 11.5–15.5)
WBC Count: 3.2 10*3/uL — ABNORMAL LOW (ref 4.0–10.5)
nRBC: 0 % (ref 0.0–0.2)

## 2021-05-03 MED ORDER — SODIUM CHLORIDE 0.9 % IV SOLN
10.0000 mg | Freq: Once | INTRAVENOUS | Status: AC
  Administered 2021-05-03: 10 mg via INTRAVENOUS
  Filled 2021-05-03: qty 10

## 2021-05-03 MED ORDER — SODIUM CHLORIDE 0.9 % IV SOLN
175.0000 mg/m2 | Freq: Once | INTRAVENOUS | Status: AC
  Administered 2021-05-03: 306 mg via INTRAVENOUS
  Filled 2021-05-03: qty 51

## 2021-05-03 MED ORDER — SODIUM CHLORIDE 0.9% FLUSH
10.0000 mL | Freq: Once | INTRAVENOUS | Status: AC
  Administered 2021-05-03: 10 mL

## 2021-05-03 MED ORDER — CYANOCOBALAMIN 1000 MCG/ML IJ SOLN
1000.0000 ug | Freq: Once | INTRAMUSCULAR | Status: AC
  Administered 2021-05-03: 1000 ug via INTRAMUSCULAR
  Filled 2021-05-03: qty 1

## 2021-05-03 MED ORDER — SODIUM CHLORIDE 0.9% FLUSH
10.0000 mL | INTRAVENOUS | Status: DC | PRN
  Administered 2021-05-03: 10 mL

## 2021-05-03 MED ORDER — SODIUM CHLORIDE 0.9 % IV SOLN
498.6000 mg | Freq: Once | INTRAVENOUS | Status: AC
  Administered 2021-05-03: 500 mg via INTRAVENOUS
  Filled 2021-05-03: qty 50

## 2021-05-03 MED ORDER — SODIUM CHLORIDE 0.9 % IV SOLN: Freq: Once | INTRAVENOUS | Status: AC

## 2021-05-03 MED ORDER — FAMOTIDINE 20 MG IN NS 100 ML IVPB
20.0000 mg | Freq: Once | INTRAVENOUS | Status: AC
  Administered 2021-05-03: 20 mg via INTRAVENOUS
  Filled 2021-05-03: qty 100

## 2021-05-03 MED ORDER — HEPARIN SOD (PORK) LOCK FLUSH 100 UNIT/ML IV SOLN
500.0000 [IU] | Freq: Once | INTRAVENOUS | Status: AC | PRN
  Administered 2021-05-03: 500 [IU]

## 2021-05-03 MED ORDER — DIPHENHYDRAMINE HCL 50 MG/ML IJ SOLN
25.0000 mg | Freq: Once | INTRAMUSCULAR | Status: AC
  Administered 2021-05-03: 25 mg via INTRAVENOUS
  Filled 2021-05-03: qty 1

## 2021-05-03 MED ORDER — PALONOSETRON HCL INJECTION 0.25 MG/5ML
0.2500 mg | Freq: Once | INTRAVENOUS | Status: AC
  Administered 2021-05-03: 0.25 mg via INTRAVENOUS
  Filled 2021-05-03: qty 5

## 2021-05-03 MED ORDER — SODIUM CHLORIDE 0.9 % IV SOLN
150.0000 mg | Freq: Once | INTRAVENOUS | Status: AC
  Administered 2021-05-03: 150 mg via INTRAVENOUS
  Filled 2021-05-03: qty 150

## 2021-05-03 NOTE — Assessment & Plan Note (Signed)
She had minor pre-existing peripheral neuropathy but could be exacerbated by B12 deficiency She did not feel worsening peripheral neuropathy; in fact, her neuropathy is improving We will proceed with chemotherapy without changes As above, she will also get vitamin B12 injection today

## 2021-05-03 NOTE — Progress Notes (Signed)
Per Dr Alvy Bimler, ok to proceed without CMP results.

## 2021-05-03 NOTE — Assessment & Plan Note (Addendum)
She has both combined B12 and iron deficiency She does not need blood transfusion I recommend we proceed with another B12 injection today and she is in agreement I will proceed with treatment without delay despite mild neutropenia

## 2021-05-03 NOTE — Progress Notes (Signed)
[facility] OFFICE PROGRESS NOTE  Patient Care Team: Default, Provider, MD as PCP - General  ASSESSMENT & PLAN:  Uterine cancer [facility]) She felt better after recent treatment Her appetite is improved and she has gained some weight Her energy level is great We will proceed with treatment without delay   Deficiency anemia She has both combined B12 and iron deficiency She does not need blood transfusion I recommend we proceed with another B12 injection today and she is in agreement I will proceed with treatment without delay despite mild neutropenia  Idiopathic neuropathy She had minor pre-existing peripheral neuropathy but could be exacerbated by B12 deficiency She did not feel worsening peripheral neuropathy; in fact, her neuropathy is improving We will proceed with chemotherapy without changes As above, she will also get vitamin B12 injection today  No orders of the defined types were placed in this encounter.   All questions were answered. The patient knows to call the clinic with any problems, questions or concerns. The total time spent in the appointment was 20 minutes encounter with patients including review of chart and various tests results, discussions about plan of care and coordination of care plan   [name], MD 05/03/2021 11:08 AM  INTERVAL HISTORY: Please see below for problem oriented charting. she returns for treatment follow-up seen prior to cycle 3 of treatment on carboplatin and paclitaxel for uterine cancer She is doing very well She denies side effects from treatment She felt that peripheral neuropathy is improving She is not symptomatic from anemia such as dizziness, shortness of breath for others  REVIEW OF SYSTEMS:   Constitutional: Denies fevers, chills or abnormal weight loss Eyes: Denies blurriness of vision Ears, nose, mouth, throat, and face: Denies mucositis or sore throat Respiratory: Denies cough, dyspnea or  wheezes Cardiovascular: Denies palpitation, chest discomfort or lower extremity swelling Gastrointestinal:  Denies nausea, heartburn or change in bowel habits Skin: Denies abnormal skin rashes Lymphatics: Denies new lymphadenopathy or easy bruising Behavioral/Psych: Mood is stable, no new changes  All other systems were reviewed with the patient and are negative.  I have reviewed the past medical history, past surgical history, social history and family history with the patient and they are unchanged from previous note.  ALLERGIES:  has No Known Allergies.  MEDICATIONS:  Current Outpatient Medications  Medication Sig Dispense Refill   dexamethasone (DECADRON) 4 MG tablet Take 2 tabs at the night before and 2 tab the morning of chemotherapy, every 3 weeks, by mouth x 6 cycles 36 tablet 6   lidocaine-prilocaine (EMLA) cream Apply to affected area once 30 g 3   ondansetron (ZOFRAN) 8 MG tablet Take 1 tablet (8 mg total) by mouth every 8 (eight) hours as needed. 30 tablet 1   prochlorperazine (COMPAZINE) 10 MG tablet Take 1 tablet (10 mg total) by mouth every 6 (six) hours as needed (Nausea or vomiting). 30 tablet 1   No current facility-administered medications for this visit.    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   Uterine cancer ([place])  01/05/2021 Imaging   Outside CT imaging  Bilateral hydronephrosis related extrinsic compression on the ureters by the enlarged uterus.   Multiple calcified uterine fibroids although peripherally enhancing diffuse decreased attenuation is noted centrally  within the uterus highly suspicious for endometrial neoplasm given the current history of vaginal bleeding. Further  evaluation is recommended. MRI may be helpful. Additionally tissue sampling would likely be helpful.   Hypodensity in the pancreas at the junction of the head and body of the pancreas. This may be related to volume averaging although the possibility of a cystic lesion could not be totally excluded. MRI is recommended for further evaluation.    01/05/2021 Initial Diagnosis   The patient reported at least 1 year of postmenopausal bleeding.  She does not have a primary care provider or gynecologist.  She had known fibroids and had noted that she needed a hysterectomy but had not gotten around to seeking care for this.  She presented to the [facility] emergency department on January 05, 2021 with new onset right lower quadrant pain and this prompted a CT scan of the abdomen and pelvis to be performed on 01/05/2021.     01/14/2021 Pathology Results   FINAL MICROSCOPIC DIAGNOSIS:   A. CERVIX, ENDOCERVIX, BIOPSY:  -  Endometrioid carcinoma, FIGO grade 1  -  See comment    01/26/2021 Imaging   MRI abdomen  1. No suspicious pancreatic mass identified. 2. Moderate to severe bilateral hydroureteronephrosis, similar to recent CT the abdomen and pelvis, presumably related to extrinsic ureteral compression from the patient's enlarged uterus, as demonstrated on recent CT the abdomen and pelvis 01/05/2021.   02/01/2021 Surgery   Surgery in [facility]  Date of Service: February 01, 2021 3:13 PM  Preoperative Diagnoses: Endometrial cancer  Postoperative Diagnoses: Endometrial cancer  Procedures: Total abdominal hysterectomy, bilateral salpingo-oophorectomy, left pelvic lymphadenectomy, right para-aortic lymphadenectomy  Surgeon: Everitt Amber, MD  Findings: Tumor appreciated in the cervical canal. Large, broad, multi fibroid uterus with a calcified fibroid filling the posterior cul de sac, an anterior lower uterine segment fibroid, and bilateral fibroids near the level of the cardinal ligament. No evidence of intraperitoneal  disease. Normal appearing bilateral tubes and ovaries. Bilateral hydroureter from uterine compression. Tumor excreted from specimen into the pelvis at time of removal. An approximately 2cm enlarged left pelvic lymph node and a 1cm enlarged right aorta caval lymph node were identified.     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) - Right ovary, negative for tumor - Bilateral fallopian tubes, negative for tumor - Multiple leiomyomata (up to 8.5 cm)   B: Lymph node, aortocaval, excision - Metastatic carcinoma involving 1 lymph node (0.4 cm) (1/1)   C: Lymph node, left pelvic, excision -Metastatic carcinoma involving 1 lymph node (0.3 cm) (1/1)   Diagnosis Comment    The majority of this tumor is composed of well differentiated (overall grade 2, but with areas consistent with grade 1) endometrioid adenocarcinoma with squamous and mucinous differentiation (approximately 70%); multifocally there is abrupt transition to an undifferentiated malignancy with a high-grade monotonous noncohesive appearance (best seen in slide A2) consistent with undifferentiated carcinoma.  The overall morphologic and immunophenotypic findings are consistent with a dedifferentiated endometrial carcinoma.   Of note, the carcinoma present in the lymph nodes is well differentiated endometrioid carcinoma and the metastatic deposit in the ovary is undifferentiated carcinoma. Carcinoma is present less than 1 mm from uterine corpus serosa (  in the lower uterine segment).  The assessment of the paracervical soft tissue margins is challenging due to the disrupted nature of the  specimen in this area, however tumor appears present less than 1 mm from paracervical soft tissue.  No definite parametrial invasion is identified.   Immunohistochemistry performed at [facility] (with appropriate controls) demonstrates the following immunoprofile in cells of interest: Well differentiated endometrioid carcinoma: CK AE1/AE3: Positive ER: Positive (>90%, strong staining) P53: Favor wild-type (see comment below)   Dedifferentiated carcinoma: CK AE1/AE3: Very rare cells, predominantly negative ER: Negative P53: Small focus of mutant type staining in a background of wild-type staining (see comment below)   P53 staining comment: In the well differentiated carcinoma component the staining pattern is predominantly wild-type although focally there is slightly increased levels of staining noted.  In the dedifferentiated tumor component, most of the tissue exhibits wild-type pattern staining, however there is a distinct small area that exhibits increased/overexpressed mutant pattern p53 staining.    Immunohistochemistry testing for mismatch repair (MMR) proteins is performed on block A2:   MLH1:  Loss of nuclear expression MSH2:  Intact nuclear expression MSH6:  Intact nuclear expression PMS2:  Loss of nuclear expression   Loss of nuclear expression of MLH1 and PMS2 can be a result of sporadic microsatellite instability (via hypermethylation of the MLH1 promoter region) or due to Lynch syndrome / [place]. Materials will be submitted to the Molecular Pathology Laboratory [phone]) for [facility] promoter hypermethylation testing. These results will be issued in a separate report.      03/05/2021 Initial Diagnosis   Uterine cancer ([place])   03/05/2021 Cancer Staging   Staging form: Corpus Uteri - Carcinoma and Carcinosarcoma, AJCC 8th Edition - Pathologic stage from 03/05/2021: Stage IIIC2 (pT3, pN2a, cM0) - Signed by [name], MD on 03/05/2021 Stage prefix: Initial diagnosis    03/11/2021  Imaging   1. Status post interval hysterectomy and oophorectomy. 2. There is a rim enhancing soft tissue mass centered in the vagina, measuring approximately 4.4 x 3.1 cm. This is new, or significantly enlarged compared to prior examination dated 01/05/2021. Findings are concerning for malignant involvement. Correlate with physical examination. Contrast enhanced MRI may be helpful to further detail soft tissue anatomy of the low pelvis if indicated. 3. There is at least one newly enlarged left iliac lymph node measuring up to 1.4 x 1.3 cm, nonspecific although concerning for nodal metastatic disease. 4. Fluid attenuation lymphocele or seroma adjacent to surgical clip at the level right ovarian vein. 5. Status post interval midline laparotomy. Subcutaneous fluid within the inferior aspect of the wound. The presence or absence of infection is not established by CT.     03/23/2021 -  Chemotherapy   Patient is on Treatment Plan : UTERINE Carboplatin AUC 6 / Paclitaxel q21d     03/23/2021 Procedure   Placement of a subcutaneous power-injectable port device. Catheter tip at the superior cavoatrial junction     PHYSICAL EXAMINATION: ECOG PERFORMANCE STATUS: 1 - Symptomatic but completely ambulatory  Vitals:   05/03/21 1104  BP: (!) 146/77  Pulse: 67  Resp: 18  SpO2: 100%   Filed Weights   05/03/21 1104  Weight: 156 lb (70.8 kg)    GENERAL:alert, no distress and comfortable SKIN: skin color, texture, turgor are normal, no rashes or significant lesions EYES: normal, Conjunctiva are pink and non-injected, sclera clear OROPHARYNX:no exudate, no erythema and lips, buccal mucosa, and tongue normal  NECK: supple, thyroid normal size, non-tender, without nodularity LYMPH:  no palpable lymphadenopathy in the cervical, axillary or inguinal LUNGS: clear to auscultation and percussion with normal breathing effort HEART: regular rate & rhythm and no murmurs and no lower extremity  edema ABDOMEN:abdomen soft, non-tender and normal bowel sounds Musculoskeletal:no cyanosis of digits and no clubbing  NEURO: alert & oriented x 3 with fluent speech, no focal motor/sensory deficits  LABORATORY DATA:  I have reviewed the data as listed    Component Value Date/Time   NA 140 04/12/2021 0947   K 4.3 04/12/2021 0947   CL 108 04/12/2021 0947   CO2 25 04/12/2021 0947   GLUCOSE 91 04/12/2021 0947   BUN 12 04/12/2021 0947   CREATININE 0.64 04/12/2021 0947   CALCIUM 9.3 04/12/2021 0947   PROT 6.9 04/12/2021 0947   ALBUMIN 3.5 04/12/2021 0947   AST 11 (L) 04/12/2021 0947   ALT 15 04/12/2021 0947   ALKPHOS 113 04/12/2021 0947   BILITOT 0.2 (L) 04/12/2021 0947   GFRNONAA >60 04/12/2021 0947    No results found for: SPEP, UPEP  Lab Results  Component Value Date   WBC 3.2 (L) 05/03/2021   NEUTROABS 1.1 (L) 05/03/2021   HGB 9.2 (L) 05/03/2021   HCT 31.5 (L) 05/03/2021   MCV 72.7 (L) 05/03/2021   PLT 216 05/03/2021      Chemistry      Component Value Date/Time   NA 140 04/12/2021 0947   K 4.3 04/12/2021 0947   CL 108 04/12/2021 0947   CO2 25 04/12/2021 0947   BUN 12 04/12/2021 0947   CREATININE 0.64 04/12/2021 0947      Component Value Date/Time   CALCIUM 9.3 04/12/2021 0947   ALKPHOS 113 04/12/2021 0947   AST 11 (L) 04/12/2021 0947   ALT 15 04/12/2021 0947   BILITOT 0.2 (L) 04/12/2021 9539

## 2021-05-03 NOTE — Patient Instructions (Signed)
[facility]COL[facility]  Discharge Instructions: Thank you for choosing [facility] to provide your oncology and hematology care.   If you have a lab appointment with the Cancer Center, please go directly to the Cancer Center and check in at the registration area.   Wear comfortable clothing and clothing appropriate for easy access to any Portacath or PICC line.   We strive to give you quality time with your provider. You may need to reschedule your appointment if you arrive late (15 or more minutes).  Arriving late affects you and other patients whose appointments are after yours.  Also, if you miss three or more appointments without notifying the office, you may be dismissed from the clinic at the provider's discretion.      For prescription refill requests, have your pharmacy contact our office and allow 72 hours for refills to be completed.    Today you received the following chemotherapy and/or immunotherapy agents Taxol & Carbo      To help prevent nausea and vomiting after your treatment, we encourage you to take your nausea medication as directed.  BELOW ARE SYMPTOMS THAT SHOULD BE REPORTED IMMEDIATELY: *FEVER GREATER THAN 100.4 F (38 C) OR HIGHER *CHILLS OR SWEATING *NAUSEA AND VOMITING THAT IS NOT CONTROLLED WITH YOUR NAUSEA MEDICATION *UNUSUAL SHORTNESS OF BREATH *UNUSUAL BRUISING OR BLEEDING *URINARY PROBLEMS (pain or burning when urinating, or frequent urination) *BOWEL PROBLEMS (unusual diarrhea, constipation, pain near the anus) TENDERNESS IN MOUTH AND THROAT WITH OR WITHOUT PRESENCE OF ULCERS (sore throat, sores in mouth, or a toothache) UNUSUAL RASH, SWELLING OR PAIN  UNUSUAL VAGINAL DISCHARGE OR ITCHING   Items with * indicate a potential emergency and should be followed up as soon as possible or go to the Emergency Department if any problems should occur.  Please show the CHEMOTHERAPY ALERT CARD or IMMUNOTHERAPY ALERT CARD at check-in  to the Emergency Department and triage nurse.  Should you have questions after your visit or need to cancel or reschedule your appointment, please contact [facility]  Dept: [phone]  and follow the prompts.  Office hours are 8:00 a.m. to 4:30 p.m. Monday - Friday. Please note that voicemails left after 4:00 p.m. may not be returned until the following business day.  We are closed weekends and major holidays. You have access to a nurse at all times for urgent questions. Please call the main number to the clinic Dept: [phone] and follow the prompts.   For any non-urgent questions, you may also contact your provider using MyChart. We now offer e-Visits for anyone 18 and older to request care online for non-urgent symptoms. For details visit [email or web address].   Also download the MyChart app! Go to the app store, search "MyChart", open the app, select , and log in with your MyChart username and password.  Due to Covid, a mask is required upon entering the hospital/clinic. If you do not have a mask, one will be given to you upon arrival. For doctor visits, patients may have 1 support person aged 18 or older with them. For treatment visits, patients cannot have anyone with them due to current Covid guidelines and our immunocompromised population.   

## 2021-05-03 NOTE — Assessment & Plan Note (Signed)
She felt better after recent treatment Her appetite is improved and she has gained some weight Her energy level is great We will proceed with treatment without delay

## 2021-05-04 ENCOUNTER — Encounter: Payer: Self-pay | Admitting: Hematology and Oncology

## 2021-05-13 ENCOUNTER — Telehealth: Payer: Self-pay

## 2021-05-13 NOTE — Telephone Encounter (Signed)
Returned her call and reviewed apts on 11/14, she verbalized understanding.

## 2021-05-21 MED FILL — Dexamethasone Sodium Phosphate Inj 100 MG/10ML: INTRAMUSCULAR | Qty: 1 | Status: AC

## 2021-05-21 MED FILL — Fosaprepitant Dimeglumine For IV Infusion 150 MG (Base Eq): INTRAVENOUS | Qty: 5 | Status: AC

## 2021-05-24 ENCOUNTER — Inpatient Hospital Stay: Payer: Medicare Other | Attending: Gynecologic Oncology

## 2021-05-24 ENCOUNTER — Other Ambulatory Visit: Payer: Self-pay

## 2021-05-24 ENCOUNTER — Encounter: Payer: Self-pay | Admitting: Hematology and Oncology

## 2021-05-24 ENCOUNTER — Ambulatory Visit: Payer: Medicare Other

## 2021-05-24 ENCOUNTER — Inpatient Hospital Stay (HOSPITAL_BASED_OUTPATIENT_CLINIC_OR_DEPARTMENT_OTHER): Payer: Medicare Other | Admitting: Hematology and Oncology

## 2021-05-24 DIAGNOSIS — R635 Abnormal weight gain: Secondary | ICD-10-CM | POA: Diagnosis not present

## 2021-05-24 DIAGNOSIS — D539 Nutritional anemia, unspecified: Secondary | ICD-10-CM | POA: Diagnosis not present

## 2021-05-24 DIAGNOSIS — G609 Hereditary and idiopathic neuropathy, unspecified: Secondary | ICD-10-CM | POA: Diagnosis not present

## 2021-05-24 DIAGNOSIS — Z7952 Long term (current) use of systemic steroids: Secondary | ICD-10-CM | POA: Diagnosis not present

## 2021-05-24 DIAGNOSIS — C55 Malignant neoplasm of uterus, part unspecified: Secondary | ICD-10-CM | POA: Insufficient documentation

## 2021-05-24 DIAGNOSIS — D61818 Other pancytopenia: Secondary | ICD-10-CM | POA: Diagnosis not present

## 2021-05-24 DIAGNOSIS — Z5111 Encounter for antineoplastic chemotherapy: Secondary | ICD-10-CM | POA: Diagnosis present

## 2021-05-24 DIAGNOSIS — Z79899 Other long term (current) drug therapy: Secondary | ICD-10-CM | POA: Insufficient documentation

## 2021-05-24 LAB — CMP (CANCER CENTER ONLY)
ALT: 19 U/L (ref 0–44)
AST: 18 U/L (ref 15–41)
Albumin: 3.5 g/dL (ref 3.5–5.0)
Alkaline Phosphatase: 114 U/L (ref 38–126)
Anion gap: 8 (ref 5–15)
BUN: 20 mg/dL (ref 8–23)
CO2: 24 mmol/L (ref 22–32)
Calcium: 9.1 mg/dL (ref 8.9–10.3)
Chloride: 107 mmol/L (ref 98–111)
Creatinine: 0.71 mg/dL (ref 0.44–1.00)
GFR, Estimated: >60 mL/min (ref >60)
Glucose, Bld: 98 mg/dL (ref 70–99)
Potassium: 4.2 mmol/L (ref 3.5–5.1)
Sodium: 139 mmol/L (ref 135–145)
Total Bilirubin: <0.2 mg/dL — ABNORMAL LOW (ref 0.3–1.2)
Total Protein: 7 g/dL (ref 6.5–8.1)

## 2021-05-24 LAB — CBC WITH DIFFERENTIAL (CANCER CENTER ONLY)
Abs Immature Granulocytes: 0 10*3/uL (ref 0.00–0.07)
Basophils Absolute: 0.1 10*3/uL (ref 0.0–0.1)
Basophils Relative: 1 %
Eosinophils Absolute: 0 10*3/uL (ref 0.0–0.5)
Eosinophils Relative: 1 %
HCT: 31.4 % — ABNORMAL LOW (ref 36.0–46.0)
Hemoglobin: 9.3 g/dL — ABNORMAL LOW (ref 12.0–15.0)
Immature Granulocytes: 0 %
Lymphocytes Relative: 46 %
Lymphs Abs: 1.6 10*3/uL (ref 0.7–4.0)
MCH: 21.5 pg — ABNORMAL LOW (ref 26.0–34.0)
MCHC: 29.6 g/dL — ABNORMAL LOW (ref 30.0–36.0)
MCV: 72.7 fL — ABNORMAL LOW (ref 80.0–100.0)
Monocytes Absolute: 0.3 10*3/uL (ref 0.1–1.0)
Monocytes Relative: 10 %
Neutro Abs: 1.4 10*3/uL — ABNORMAL LOW (ref 1.7–7.7)
Neutrophils Relative %: 42 %
Platelet Count: 286 10*3/uL (ref 150–400)
RBC: 4.32 MIL/uL (ref 3.87–5.11)
RDW: 19.4 % — ABNORMAL HIGH (ref 11.5–15.5)
WBC Count: 3.5 10*3/uL — ABNORMAL LOW (ref 4.0–10.5)
nRBC: 0 % (ref 0.0–0.2)

## 2021-05-24 MED ORDER — DIPHENHYDRAMINE HCL 50 MG/ML IJ SOLN
25.0000 mg | Freq: Once | INTRAMUSCULAR | Status: AC
  Administered 2021-05-24: 25 mg via INTRAVENOUS

## 2021-05-24 MED ORDER — SODIUM CHLORIDE 0.9 % IV SOLN
538.8000 mg | Freq: Once | INTRAVENOUS | Status: AC
  Administered 2021-05-24: 540 mg via INTRAVENOUS
  Filled 2021-05-24: qty 54

## 2021-05-24 MED ORDER — HEPARIN SOD (PORK) LOCK FLUSH 100 UNIT/ML IV SOLN
500.0000 [IU] | Freq: Once | INTRAVENOUS | Status: AC | PRN
  Administered 2021-05-24: 500 [IU]

## 2021-05-24 MED ORDER — FAMOTIDINE 20 MG IN NS 100 ML IVPB
20.0000 mg | Freq: Once | INTRAVENOUS | Status: AC
  Administered 2021-05-24: 20 mg via INTRAVENOUS

## 2021-05-24 MED ORDER — ONDANSETRON HCL 4 MG/2ML IJ SOLN: 4.0000 mg | Freq: Once | INTRAMUSCULAR | Status: DC

## 2021-05-24 MED ORDER — SODIUM CHLORIDE 0.9 % IV SOLN
10.0000 mg | Freq: Once | INTRAVENOUS | Status: AC
  Administered 2021-05-24: 10 mg via INTRAVENOUS
  Filled 2021-05-24: qty 10

## 2021-05-24 MED ORDER — PALONOSETRON HCL INJECTION 0.25 MG/5ML
0.2500 mg | Freq: Once | INTRAVENOUS | Status: AC
  Administered 2021-05-24: 0.25 mg via INTRAVENOUS

## 2021-05-24 MED ORDER — SODIUM CHLORIDE 0.9% FLUSH
10.0000 mL | INTRAVENOUS | Status: DC | PRN
  Administered 2021-05-24: 10 mL

## 2021-05-24 MED ORDER — SODIUM CHLORIDE 0.9% FLUSH
10.0000 mL | Freq: Once | INTRAVENOUS | Status: AC
  Administered 2021-05-24: 10 mL

## 2021-05-24 MED ORDER — SODIUM CHLORIDE 0.9 % IV SOLN: Freq: Once | INTRAVENOUS | Status: AC

## 2021-05-24 MED ORDER — SODIUM CHLORIDE 0.9 % IV SOLN
175.0000 mg/m2 | Freq: Once | INTRAVENOUS | Status: AC
  Administered 2021-05-24: 324 mg via INTRAVENOUS
  Filled 2021-05-24: qty 54

## 2021-05-24 MED ORDER — FOSAPREPITANT DIMEGLUMINE INJECTION 150 MG
150.0000 mg | Freq: Once | INTRAVENOUS | Status: AC
  Administered 2021-05-24: 150 mg via INTRAVENOUS
  Filled 2021-05-24: qty 150

## 2021-05-24 NOTE — Assessment & Plan Note (Signed)
She denies worsening neuropathy Observe closely

## 2021-05-24 NOTE — Patient Instructions (Signed)
[facility]  Discharge Instructions: Thank you for choosing [facility] to provide your oncology and hematology care.   If you have a lab appointment with the Cancer Center, please go directly to the Cancer Center and check in at the registration area.   Wear comfortable clothing and clothing appropriate for easy access to any Portacath or PICC line.   We strive to give you quality time with your provider. You may need to reschedule your appointment if you arrive late (15 or more minutes).  Arriving late affects you and other patients whose appointments are after yours.  Also, if you miss three or more appointments without notifying the office, you may be dismissed from the clinic at the provider's discretion.      For prescription refill requests, have your pharmacy contact our office and allow 72 hours for refills to be completed.    Today you received the following chemotherapy and/or immunotherapy agents Taxol & Carbo      To help prevent nausea and vomiting after your treatment, we encourage you to take your nausea medication as directed.  BELOW ARE SYMPTOMS THAT SHOULD BE REPORTED IMMEDIATELY: *FEVER GREATER THAN 100.4 F (38 C) OR HIGHER *CHILLS OR SWEATING *NAUSEA AND VOMITING THAT IS NOT CONTROLLED WITH YOUR NAUSEA MEDICATION *UNUSUAL SHORTNESS OF BREATH *UNUSUAL BRUISING OR BLEEDING *URINARY PROBLEMS (pain or burning when urinating, or frequent urination) *BOWEL PROBLEMS (unusual diarrhea, constipation, pain near the anus) TENDERNESS IN MOUTH AND THROAT WITH OR WITHOUT PRESENCE OF ULCERS (sore throat, sores in mouth, or a toothache) UNUSUAL RASH, SWELLING OR PAIN  UNUSUAL VAGINAL DISCHARGE OR ITCHING   Items with * indicate a potential emergency and should be followed up as soon as possible or go to the Emergency Department if any problems should occur.  Please show the CHEMOTHERAPY ALERT CARD or IMMUNOTHERAPY ALERT CARD at check-in  to the Emergency Department and triage nurse.  Should you have questions after your visit or need to cancel or reschedule your appointment, please contact [facility]  Dept: [phone]  and follow the prompts.  Office hours are 8:00 a.m. to 4:30 p.m. Monday - Friday. Please note that voicemails left after 4:00 p.m. may not be returned until the following business day.  We are closed weekends and major holidays. You have access to a nurse at all times for urgent questions. Please call the main number to the clinic Dept: [phone] and follow the prompts.   For any non-urgent questions, you may also contact your provider using MyChart. We now offer e-Visits for anyone 18 and older to request care online for non-urgent symptoms. For details visit [email or web address].   Also download the MyChart app! Go to the app store, search "MyChart", open the app, select [place], and log in with your MyChart username and password.  Due to Covid, a mask is required upon entering the hospital/clinic. If you do not have a mask, one will be given to you upon arrival. For doctor visits, patients may have 1 support person aged 18 or older with them. For treatment visits, patients cannot have anyone with them due to current Covid guidelines and our immunocompromised population.   

## 2021-05-24 NOTE — Assessment & Plan Note (Signed)
She has multifactorial pancytopenia She had received vitamin B12 as well as IV iron treatment I plan to repeat blood work again in her next visit She might need more IV iron in the future

## 2021-05-24 NOTE — Assessment & Plan Note (Signed)
She felt better after recent treatment Her appetite is improved and she has gained some weight Her energy level is great We will proceed with treatment without delay I have to recalculate her chemotherapy dose due to significant weight gain

## 2021-05-24 NOTE — Progress Notes (Signed)
[facility] OFFICE PROGRESS NOTE  Patient Care Team: Default, Provider, MD as PCP - General  ASSESSMENT & PLAN:  Uterine cancer [facility]) She felt better after recent treatment Her appetite is improved and she has gained some weight Her energy level is great We will proceed with treatment without delay I have to recalculate her chemotherapy dose due to significant weight gain   Deficiency anemia She has multifactorial pancytopenia She had received vitamin B12 as well as IV iron treatment I plan to repeat blood work again in her next visit She might need more IV iron in the future  Idiopathic neuropathy She denies worsening neuropathy Observe closely  Weight gain She had profound weight gain since surgery I will check TSH in her next visit  Orders Placed This Encounter  Procedures   Vitamin B12    Standing Status:   Standing    Number of Occurrences:   1    Standing Expiration Date:   05/24/2022   Ferritin    Standing Status:   Standing    Number of Occurrences:   1    Standing Expiration Date:   05/24/2022   Iron and TIBC    Standing Status:   Standing    Number of Occurrences:   1    Standing Expiration Date:   05/24/2022   TSH    Standing Status:   Standing    Number of Occurrences:   1    Standing Expiration Date:   05/24/2022    All questions were answered. The patient knows to call the clinic with any problems, questions or concerns. The total time spent in the appointment was 30 minutes encounter with patients including review of chart and various tests results, discussions about plan of care and coordination of care plan   [name], MD 05/24/2021 3:54 PM  INTERVAL HISTORY: Please see below for problem oriented charting. she returns for treatment follow-up seen prior to cycle 4 of carboplatin and paclitaxel Since last time I saw her, she had gained almost 15 pounds The patient felt hungry all the time No worsening peripheral  neuropathy Denies recent nausea or changes in bowel habits No recent infection  REVIEW OF SYSTEMS:   Constitutional: Denies fevers, chills or abnormal weight loss Eyes: Denies blurriness of vision Ears, nose, mouth, throat, and face: Denies mucositis or sore throat Respiratory: Denies cough, dyspnea or wheezes Cardiovascular: Denies palpitation, chest discomfort or lower extremity swelling Gastrointestinal:  Denies nausea, heartburn or change in bowel habits Skin: Denies abnormal skin rashes Lymphatics: Denies new lymphadenopathy or easy bruising Neurological:Denies numbness, tingling or new weaknesses Behavioral/Psych: Mood is stable, no new changes  All other systems were reviewed with the patient and are negative.  I have reviewed the past medical history, past surgical history, social history and family history with the patient and they are unchanged from previous note.  ALLERGIES:  has No Known Allergies.  MEDICATIONS:  Current Outpatient Medications  Medication Sig Dispense Refill   dexamethasone (DECADRON) 4 MG tablet Take 2 tabs at the night before and 2 tab the morning of chemotherapy, every 3 weeks, by mouth x 6 cycles 36 tablet 6   lidocaine-prilocaine (EMLA) cream Apply to affected area once 30 g 3   ondansetron (ZOFRAN) 8 MG tablet Take 1 tablet (8 mg total) by mouth every 8 (eight) hours as needed. 30 tablet 1   prochlorperazine (COMPAZINE) 10 MG tablet Take 1 tablet (10 mg total) by mouth every 6 (six) hours  as needed (Nausea or vomiting). 30 tablet 1   No current facility-administered medications for this visit.   Facility-Administered Medications Ordered in Other Visits  Medication Dose Route Frequency Provider Last Rate Last Admin   CARBOplatin (PARAPLATIN) 540 mg in sodium chloride 0.9 % 250 mL chemo infusion  540 mg Intravenous Once Alvy Bimler, Taleisha Kaczynski, MD       heparin lock flush 100 unit/mL  500 Units Intracatheter Once PRN Alvy Bimler, Jada Fass, MD       PACLitaxel (TAXOL) 324  mg in sodium chloride 0.9 % 500 mL chemo infusion (> $RemoveBef'80mg'lCJcVfgwiH$ /m2)  175 mg/m2 (Treatment Plan Recorded) Intravenous Once [name], MD 185 mL/hr at 05/24/21 1352 324 mg at 05/24/21 1352   sodium chloride flush (NS) 0.9 % injection 10 mL  10 mL Intracatheter PRN [name], MD        SUMMARY OF ONCOLOGIC HISTORY: Oncology History Overview Note  Endometrioid cancer, 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   Uterine cancer ([facility])  01/05/2021 Imaging   Outside CT imaging  Bilateral hydronephrosis related extrinsic compression on the ureters by the enlarged uterus.   Multiple calcified uterine fibroids although peripherally enhancing diffuse decreased attenuation is noted centrally within the uterus highly suspicious for endometrial neoplasm given the current history of vaginal bleeding. Further evaluation is recommended. MRI may be helpful. Additionally tissue sampling would likely be helpful.   Hypodensity in the pancreas at the junction of the head and body of the pancreas. This may be related to volume averaging although the possibility of a cystic lesion could not be totally excluded. MRI is recommended for further evaluation.    01/05/2021 Initial Diagnosis   The patient reported at least 1 year of postmenopausal bleeding.  She does not have a primary care provider or gynecologist.  She had known fibroids and had noted that she needed a hysterectomy but had not gotten around to seeking care for this.  She presented to the [facility] emergency department on January 05, 2021 with new onset right lower quadrant pain and this prompted a CT scan of the abdomen and pelvis to be performed on 01/05/2021.     01/14/2021 Pathology Results   FINAL MICROSCOPIC DIAGNOSIS:   A. CERVIX, ENDOCERVIX, BIOPSY:  -  Endometrioid  carcinoma, FIGO grade 1  -  See comment    01/26/2021 Imaging   MRI abdomen  1. No suspicious pancreatic mass identified. 2. Moderate to severe bilateral hydroureteronephrosis, similar to recent CT the abdomen and pelvis, presumably related to extrinsic ureteral compression from the patient's enlarged uterus, as demonstrated on recent CT the abdomen and pelvis 01/05/2021.   02/01/2021 Surgery   Surgery in [facility]  Date of Service: February 01, 2021 3:13 PM  Preoperative Diagnoses: Endometrial cancer  Postoperative Diagnoses: Endometrial cancer  Procedures: Total abdominal hysterectomy, bilateral salpingo-oophorectomy, left pelvic lymphadenectomy, right para-aortic lymphadenectomy  Surgeon: Everitt Amber, MD  Findings: Tumor appreciated in the cervical canal. Large, broad, multi fibroid uterus with a calcified fibroid filling the posterior cul de sac, an anterior lower uterine segment fibroid, and bilateral fibroids near the level of the cardinal ligament. No evidence of intraperitoneal disease. Normal appearing bilateral tubes and ovaries. Bilateral hydroureter from uterine compression. Tumor excreted from specimen into the pelvis at time of removal. An approximately 2cm enlarged left pelvic lymph node and a 1cm  enlarged right aorta caval lymph node were identified.     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) - Right ovary, negative for tumor - Bilateral fallopian tubes, negative for tumor - Multiple leiomyomata (up to 8.5 cm)   B: Lymph node, aortocaval, excision - Metastatic carcinoma involving 1 lymph node (0.4 cm) (1/1)   C: Lymph node,  left pelvic, excision -Metastatic carcinoma involving 1 lymph node (0.3 cm) (1/1)   Diagnosis Comment    The majority of this tumor is composed of well differentiated (overall grade 2, but with areas consistent with grade 1) endometrioid adenocarcinoma with squamous and mucinous differentiation (approximately 70%); multifocally there is abrupt transition to an undifferentiated malignancy with a high-grade monotonous noncohesive appearance (best seen in slide A2) consistent with undifferentiated carcinoma.  The overall morphologic and immunophenotypic findings are consistent with a dedifferentiated endometrial carcinoma.   Of note, the carcinoma present in the lymph nodes is well differentiated endometrioid carcinoma and the metastatic deposit in the ovary is undifferentiated carcinoma. Carcinoma is present less than 1 mm from uterine corpus serosa (in the lower uterine segment).  The assessment of the paracervical soft tissue margins is challenging due to the disrupted nature of the specimen in this area, however tumor appears present less than 1 mm from paracervical soft tissue.  No definite parametrial invasion is identified.   Immunohistochemistry performed at [facility] (with appropriate controls) demonstrates the following immunoprofile in cells of interest: Well differentiated endometrioid carcinoma: CK AE1/AE3: Positive ER: Positive (>90%, strong staining) P53: Favor wild-type (see comment below)   Dedifferentiated carcinoma: CK AE1/AE3: Very rare cells, predominantly negative ER: Negative P53: Small focus of mutant type staining in a background of wild-type staining (see comment below)   P53 staining comment: In the well differentiated carcinoma component the staining pattern is predominantly wild-type although focally there is slightly increased levels of staining noted.  In the dedifferentiated tumor component, most of the tissue exhibits wild-type pattern staining, however there is a distinct  small area that exhibits increased/overexpressed mutant pattern p53 staining.    Immunohistochemistry testing for mismatch repair (MMR) proteins is performed on block A2:   MLH1:  Loss of nuclear expression MSH2:  Intact nuclear expression MSH6:  Intact nuclear expression PMS2:  Loss of nuclear expression   Loss of nuclear expression of MLH1 and PMS2 can be a result of sporadic microsatellite instability (via hypermethylation of the MLH1 promoter region) or due to Lynch syndrome / [place]. Materials will be submitted to the Molecular Pathology Laboratory [phone]) for [facility] promoter hypermethylation testing. These results will be issued in a separate report.      03/05/2021 Initial Diagnosis   Uterine cancer ([place])   03/05/2021 Cancer Staging   Staging form: Corpus Uteri - Carcinoma and Carcinosarcoma, AJCC 8th Edition - Pathologic stage from 03/05/2021: Stage IIIC2 (pT3, pN2a, cM0) - Signed by [name], MD on 03/05/2021 Stage prefix: Initial diagnosis    03/11/2021 Imaging   1. Status post interval hysterectomy and oophorectomy. 2. There is a rim enhancing soft tissue mass centered in the vagina, measuring approximately 4.4 x 3.1 cm. This is new, or significantly enlarged compared to prior examination dated 01/05/2021. Findings are concerning for malignant involvement.  Correlate with physical examination. Contrast enhanced MRI may be helpful to further detail soft tissue anatomy of the low pelvis if indicated. 3. There is at least one newly enlarged left iliac lymph node measuring up to 1.4 x 1.3 cm, nonspecific although concerning for nodal metastatic disease. 4. Fluid attenuation lymphocele or seroma adjacent to surgical clip at the level right ovarian vein. 5. Status post interval midline laparotomy. Subcutaneous fluid within the inferior aspect of the wound. The presence or absence of infection is not established by CT.     03/23/2021 -  Chemotherapy   Patient is on Treatment Plan :  UTERINE Carboplatin AUC 6 / Paclitaxel q21d     03/23/2021 Procedure   Placement of a subcutaneous power-injectable port device. Catheter tip at the superior cavoatrial junction     PHYSICAL EXAMINATION: ECOG PERFORMANCE STATUS: 1 - Symptomatic but completely ambulatory  Vitals:   05/24/21 1053  BP: 135/64  Pulse: 73  Resp: 18  Temp: 98 F (36.7 C)  SpO2: 100%   Filed Weights   05/24/21 1053  Weight: 170 lb 6.4 oz (77.3 kg)    GENERAL:alert, no distress and comfortable SKIN: skin color, texture, turgor are normal, no rashes or significant lesions EYES: normal, Conjunctiva are pink and non-injected, sclera clear OROPHARYNX:no exudate, no erythema and lips, buccal mucosa, and tongue normal  NECK: supple, thyroid normal size, non-tender, without nodularity LYMPH:  no palpable lymphadenopathy in the cervical, axillary or inguinal LUNGS: clear to auscultation and percussion with normal breathing effort HEART: regular rate & rhythm and no murmurs and no lower extremity edema ABDOMEN:abdomen soft, non-tender and normal bowel sounds Musculoskeletal:no cyanosis of digits and no clubbing  NEURO: alert & oriented x 3 with fluent speech, no focal motor/sensory deficits  LABORATORY DATA:  I have reviewed the data as listed    Component Value Date/Time   NA 139 05/24/2021 1035   K 4.2 05/24/2021 1035   CL 107 05/24/2021 1035   CO2 24 05/24/2021 1035   GLUCOSE 98 05/24/2021 1035   BUN 20 05/24/2021 1035   CREATININE 0.71 05/24/2021 1035   CALCIUM 9.1 05/24/2021 1035   PROT 7.0 05/24/2021 1035   ALBUMIN 3.5 05/24/2021 1035   AST 18 05/24/2021 1035   ALT 19 05/24/2021 1035   ALKPHOS 114 05/24/2021 1035   BILITOT <0.2 (L) 05/24/2021 1035   GFRNONAA >60 05/24/2021 1035    No results found for: SPEP, UPEP  Lab Results  Component Value Date   WBC 3.5 (L) 05/24/2021   NEUTROABS 1.4 (L) 05/24/2021   HGB 9.3 (L) 05/24/2021   HCT 31.4 (L) 05/24/2021   MCV 72.7 (L) 05/24/2021    PLT 286 05/24/2021      Chemistry      Component Value Date/Time   NA 139 05/24/2021 1035   K 4.2 05/24/2021 1035   CL 107 05/24/2021 1035   CO2 24 05/24/2021 1035   BUN 20 05/24/2021 1035   CREATININE 0.71 05/24/2021 1035      Component Value Date/Time   CALCIUM 9.1 05/24/2021 1035   ALKPHOS 114 05/24/2021 1035   AST 18 05/24/2021 1035   ALT 19 05/24/2021 1035   BILITOT <0.2 (L) 05/24/2021 1035

## 2021-05-24 NOTE — Progress Notes (Signed)
Ok to treat with ANC of 1.4 per [name], MD

## 2021-05-24 NOTE — Patient Instructions (Signed)

## 2021-05-24 NOTE — Assessment & Plan Note (Signed)
She had profound weight gain since surgery I will check TSH in her next visit

## 2021-06-11 MED FILL — Dexamethasone Sodium Phosphate Inj 100 MG/10ML: INTRAMUSCULAR | Qty: 1 | Status: AC

## 2021-06-11 MED FILL — Fosaprepitant Dimeglumine For IV Infusion 150 MG (Base Eq): INTRAVENOUS | Qty: 5 | Status: AC

## 2021-06-14 ENCOUNTER — Inpatient Hospital Stay: Payer: Medicare Other

## 2021-06-14 ENCOUNTER — Other Ambulatory Visit: Payer: Self-pay

## 2021-06-14 ENCOUNTER — Inpatient Hospital Stay: Payer: Medicare Other | Attending: Gynecologic Oncology

## 2021-06-14 ENCOUNTER — Encounter: Payer: Self-pay | Admitting: Hematology and Oncology

## 2021-06-14 ENCOUNTER — Inpatient Hospital Stay: Payer: Medicare Other | Admitting: Hematology and Oncology

## 2021-06-14 ENCOUNTER — Other Ambulatory Visit: Payer: Self-pay | Admitting: Hematology and Oncology

## 2021-06-14 DIAGNOSIS — G609 Hereditary and idiopathic neuropathy, unspecified: Secondary | ICD-10-CM

## 2021-06-14 DIAGNOSIS — C55 Malignant neoplasm of uterus, part unspecified: Secondary | ICD-10-CM

## 2021-06-14 DIAGNOSIS — Z90710 Acquired absence of both cervix and uterus: Secondary | ICD-10-CM | POA: Diagnosis not present

## 2021-06-14 DIAGNOSIS — Z5111 Encounter for antineoplastic chemotherapy: Secondary | ICD-10-CM | POA: Diagnosis present

## 2021-06-14 DIAGNOSIS — Z90722 Acquired absence of ovaries, bilateral: Secondary | ICD-10-CM | POA: Diagnosis not present

## 2021-06-14 DIAGNOSIS — D539 Nutritional anemia, unspecified: Secondary | ICD-10-CM

## 2021-06-14 DIAGNOSIS — R634 Abnormal weight loss: Secondary | ICD-10-CM | POA: Insufficient documentation

## 2021-06-14 DIAGNOSIS — R635 Abnormal weight gain: Secondary | ICD-10-CM

## 2021-06-14 DIAGNOSIS — Z9079 Acquired absence of other genital organ(s): Secondary | ICD-10-CM | POA: Insufficient documentation

## 2021-06-14 LAB — CBC WITH DIFFERENTIAL (CANCER CENTER ONLY)
Abs Immature Granulocytes: 0.01 10*3/uL (ref 0.00–0.07)
Basophils Absolute: 0 10*3/uL (ref 0.0–0.1)
Basophils Relative: 1 %
Eosinophils Absolute: 0 10*3/uL (ref 0.0–0.5)
Eosinophils Relative: 1 %
HCT: 32 % — ABNORMAL LOW (ref 36.0–46.0)
Hemoglobin: 10.1 g/dL — ABNORMAL LOW (ref 12.0–15.0)
Immature Granulocytes: 0 %
Lymphocytes Relative: 38 %
Lymphs Abs: 1.5 10*3/uL (ref 0.7–4.0)
MCH: 21.6 pg — ABNORMAL LOW (ref 26.0–34.0)
MCHC: 31.6 g/dL (ref 30.0–36.0)
MCV: 68.4 fL — ABNORMAL LOW (ref 80.0–100.0)
Monocytes Absolute: 0.5 10*3/uL (ref 0.1–1.0)
Monocytes Relative: 11 %
Neutro Abs: 2 10*3/uL (ref 1.7–7.7)
Neutrophils Relative %: 49 %
Platelet Count: 220 10*3/uL (ref 150–400)
RBC: 4.68 MIL/uL (ref 3.87–5.11)
RDW: 18.6 % — ABNORMAL HIGH (ref 11.5–15.5)
WBC Count: 4.1 10*3/uL (ref 4.0–10.5)
nRBC: 0 % (ref 0.0–0.2)

## 2021-06-14 LAB — CMP (CANCER CENTER ONLY)
ALT: 52 U/L — ABNORMAL HIGH (ref 0–44)
AST: 40 U/L (ref 15–41)
Albumin: 3.5 g/dL (ref 3.5–5.0)
Alkaline Phosphatase: 123 U/L (ref 38–126)
Anion gap: 9 (ref 5–15)
BUN: 14 mg/dL (ref 8–23)
CO2: 23 mmol/L (ref 22–32)
Calcium: 9.2 mg/dL (ref 8.9–10.3)
Chloride: 104 mmol/L (ref 98–111)
Creatinine: 0.74 mg/dL (ref 0.44–1.00)
GFR, Estimated: >60 mL/min (ref >60)
Glucose, Bld: 97 mg/dL (ref 70–99)
Potassium: 4.1 mmol/L (ref 3.5–5.1)
Sodium: 136 mmol/L (ref 135–145)
Total Bilirubin: 0.3 mg/dL (ref 0.3–1.2)
Total Protein: 7.9 g/dL (ref 6.5–8.1)

## 2021-06-14 LAB — IRON AND TIBC
Iron: 16 ug/dL — ABNORMAL LOW (ref 41–142)
Saturation Ratios: 6 % — ABNORMAL LOW (ref 21–57)
TIBC: 276 ug/dL (ref 236–444)
UIBC: 260 ug/dL (ref 120–384)

## 2021-06-14 LAB — TSH: TSH: 1.257 u[IU]/mL (ref 0.308–3.960)

## 2021-06-14 LAB — VITAMIN B12: Vitamin B-12: 733 pg/mL (ref 180–914)

## 2021-06-14 LAB — FERRITIN: Ferritin: 151 ng/mL (ref 11–307)

## 2021-06-14 MED ORDER — CYANOCOBALAMIN 1000 MCG/ML IJ SOLN
1000.0000 ug | Freq: Once | INTRAMUSCULAR | Status: AC
  Administered 2021-06-14: 1000 ug via INTRAMUSCULAR
  Filled 2021-06-14: qty 1

## 2021-06-14 MED ORDER — HEPARIN SOD (PORK) LOCK FLUSH 100 UNIT/ML IV SOLN
500.0000 [IU] | Freq: Once | INTRAVENOUS | Status: AC | PRN
  Administered 2021-06-14: 500 [IU]

## 2021-06-14 MED ORDER — SODIUM CHLORIDE 0.9 % IV SOLN
312.0000 mg | Freq: Once | INTRAVENOUS | Status: AC
  Administered 2021-06-14: 312 mg via INTRAVENOUS
  Filled 2021-06-14: qty 52

## 2021-06-14 MED ORDER — PALONOSETRON HCL INJECTION 0.25 MG/5ML
0.2500 mg | Freq: Once | INTRAVENOUS | Status: AC
  Administered 2021-06-14: 0.25 mg via INTRAVENOUS
  Filled 2021-06-14: qty 5

## 2021-06-14 MED ORDER — SODIUM CHLORIDE 0.9% FLUSH
10.0000 mL | INTRAVENOUS | Status: DC | PRN
  Administered 2021-06-14: 10 mL

## 2021-06-14 MED ORDER — SODIUM CHLORIDE 0.9% FLUSH
10.0000 mL | Freq: Once | INTRAVENOUS | Status: AC
  Administered 2021-06-14: 10 mL

## 2021-06-14 MED ORDER — SODIUM CHLORIDE 0.9 % IV SOLN
Freq: Once | INTRAVENOUS | Status: AC
Start: 2021-06-14 — End: 2021-06-14

## 2021-06-14 MED ORDER — SODIUM CHLORIDE 0.9 % IV SOLN
150.0000 mg | Freq: Once | INTRAVENOUS | Status: AC
  Administered 2021-06-14: 150 mg via INTRAVENOUS
  Filled 2021-06-14: qty 150

## 2021-06-14 MED ORDER — FAMOTIDINE 20 MG IN NS 100 ML IVPB
20.0000 mg | Freq: Once | INTRAVENOUS | Status: AC
  Administered 2021-06-14: 20 mg via INTRAVENOUS
  Filled 2021-06-14: qty 100

## 2021-06-14 MED ORDER — DIPHENHYDRAMINE HCL 50 MG/ML IJ SOLN
25.0000 mg | Freq: Once | INTRAMUSCULAR | Status: AC
Start: 2021-06-14 — End: 2021-06-14
  Administered 2021-06-14: 25 mg via INTRAVENOUS
  Filled 2021-06-14: qty 1

## 2021-06-14 MED ORDER — SODIUM CHLORIDE 0.9 % IV SOLN
513.6000 mg | Freq: Once | INTRAVENOUS | Status: AC
  Administered 2021-06-14: 510 mg via INTRAVENOUS
  Filled 2021-06-14: qty 51

## 2021-06-14 MED ORDER — SODIUM CHLORIDE 0.9 % IV SOLN
10.0000 mg | Freq: Once | INTRAVENOUS | Status: AC
  Administered 2021-06-14: 10 mg via INTRAVENOUS
  Filled 2021-06-14: qty 10

## 2021-06-14 NOTE — Progress Notes (Signed)
[place] OFFICE PROGRESS NOTE  Patient Care Team: Default, Provider, MD as PCP - General Candice Parks, [name], RN as Oncology Nurse Navigator (Oncology)  ASSESSMENT & PLAN:  Uterine cancer [facility]) She has lost some weight through significant dietary modification She denies side effects from treatment so far We will proceed with treatment as scheduled and I need to recalculate the dose of her treatment due to recent weight change   Deficiency anemia She remains anemic I will order iron studies, vitamin B12 and thyroid function for further evaluation She will also receive B12 injection today  Idiopathic neuropathy She denies worsening neuropathy Observe closely  No orders of the defined types were placed in this encounter.   All questions were answered. The patient knows to call the clinic with any problems, questions or concerns. The total time spent in the appointment was 30 minutes encounter with patients including review of chart and various tests results, discussions about plan of care and coordination of care plan   Candice Lark, MD 06/14/2021 8:55 AM  INTERVAL HISTORY: Please see below for problem oriented charting. she returns for treatment follow-up seen prior to cycle 5 of chemotherapy She have lost over 10 pounds since I saw her through aggressive dietary modification Denies worsening peripheral neuropathy Her energy level was fair No recent bleeding  REVIEW OF SYSTEMS:   Constitutional: Denies fevers, chills or abnormal weight loss Eyes: Denies blurriness of vision Ears, nose, mouth, throat, and face: Denies mucositis or sore throat Respiratory: Denies cough, dyspnea or wheezes Cardiovascular: Denies palpitation, chest discomfort or lower extremity swelling Gastrointestinal:  Denies nausea, heartburn or change in bowel habits Skin: Denies abnormal skin rashes Lymphatics: Denies new lymphadenopathy or easy bruising Neurological:Denies numbness, tingling  or new weaknesses Behavioral/Psych: Mood is stable, no new changes  All other systems were reviewed with the patient and are negative.  I have reviewed the past medical history, past surgical history, social history and family history with the patient and they are unchanged from previous note.  ALLERGIES:  has No Known Allergies.  MEDICATIONS:  Current Outpatient Medications  Medication Sig Dispense Refill   dexamethasone (DECADRON) 4 MG tablet Take 2 tabs at the night before and 2 tab the morning of chemotherapy, every 3 weeks, by mouth x 6 cycles 36 tablet 6   lidocaine-prilocaine (EMLA) cream Apply to affected area once 30 g 3   ondansetron (ZOFRAN) 8 MG tablet Take 1 tablet (8 mg total) by mouth every 8 (eight) hours as needed. 30 tablet 1   prochlorperazine (COMPAZINE) 10 MG tablet Take 1 tablet (10 mg total) by mouth every 6 (six) hours as needed (Nausea or vomiting). 30 tablet 1   No current facility-administered medications for this visit.   Facility-Administered Medications Ordered in Other Visits  Medication Dose Route Frequency Provider Last Rate Last Admin   CARBOplatin (PARAPLATIN) 510 mg in sodium chloride 0.9 % 250 mL chemo infusion  510 mg Intravenous Once Alvy Bimler, Celvin Taney, MD       cyanocobalamin ((VITAMIN B-12)) injection 1,000 mcg  1,000 mcg Intramuscular Once Alvy Bimler, Jmari Pelc, MD       dexamethasone (DECADRON) 10 mg in sodium chloride 0.9 % 50 mL IVPB  10 mg Intravenous Once Alvy Bimler, Doaa Kendzierski, MD       famotidine (PEPCID) IVPB 20 mg in NS 100 mL IVPB  20 mg Intravenous Once Marylene Masek, MD       fosaprepitant (EMEND) 150 mg in sodium chloride 0.9 % 145 mL IVPB  150 mg Intravenous Once Alvy Bimler, Trinady Milewski, MD       heparin lock flush 100 unit/mL  500 Units Intracatheter Once PRN Alvy Bimler, Lake Breeding, MD       PACLitaxel (TAXOL) 312 mg in sodium chloride 0.9 % 500 mL chemo infusion (> $RemoveBef'80mg'VpMhsjdLpv$ /m2)  312 mg Intravenous Once Alvy Bimler, Cullin Dishman, MD       sodium chloride flush (NS) 0.9 % injection 10 mL  10 mL  Intracatheter PRN Alvy Bimler, Najae Filsaime, MD        SUMMARY OF ONCOLOGIC HISTORY: Oncology History Overview Note  Endometrioid cancer, 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   Uterine cancer ([place])  01/05/2021 Imaging   Outside CT imaging  Bilateral hydronephrosis related extrinsic compression on the ureters by the enlarged uterus.   Multiple calcified uterine fibroids although peripherally enhancing diffuse decreased attenuation is noted centrally within the uterus highly suspicious for endometrial neoplasm given the current history of vaginal bleeding. Further evaluation is recommended. MRI may be helpful. Additionally tissue sampling would likely be helpful.   Hypodensity in the pancreas at the junction of the head and body of the pancreas. This may be related to volume averaging although the possibility of a cystic lesion could not be totally excluded. MRI is recommended for further evaluation.    01/05/2021 Initial Diagnosis   The patient reported at least 1 year of postmenopausal bleeding.  She does not have a primary care provider or gynecologist.  She had known fibroids and had noted that she needed a hysterectomy but had not gotten around to seeking care for this.  She presented to the [name] emergency department on January 05, 2021 with new onset right lower quadrant pain and this prompted a CT scan of the abdomen and pelvis to be performed on 01/05/2021.     01/14/2021 Pathology Results   FINAL MICROSCOPIC DIAGNOSIS:   A. CERVIX, ENDOCERVIX, BIOPSY:  -  Endometrioid carcinoma, FIGO grade 1  -  See comment    01/26/2021 Imaging   MRI abdomen  1. No suspicious pancreatic mass identified. 2. Moderate to severe bilateral hydroureteronephrosis, similar to recent CT the abdomen and pelvis, presumably related to  extrinsic ureteral compression from the patient's enlarged uterus, as demonstrated on recent CT the abdomen and pelvis 01/05/2021.   02/01/2021 Surgery   Surgery in [facility], [place]  Date of Service: February 01, 2021 3:13 PM  Preoperative Diagnoses: Endometrial cancer  Postoperative Diagnoses: Endometrial cancer  Procedures: Total abdominal hysterectomy, bilateral salpingo-oophorectomy, left pelvic lymphadenectomy, right para-aortic lymphadenectomy  Surgeon: Everitt Amber, MD  Findings: Tumor appreciated in the cervical canal. Large, broad, multi fibroid uterus with a calcified fibroid filling the posterior cul de sac, an anterior lower uterine segment fibroid, and bilateral fibroids near the level of the cardinal ligament. No evidence of intraperitoneal disease. Normal appearing bilateral tubes and ovaries. Bilateral hydroureter from uterine compression. Tumor excreted from specimen into the pelvis at time of removal. An approximately 2cm enlarged left pelvic lymph node and a 1cm enlarged right aorta caval lymph node were identified.     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 (0.4 cm) - Right ovary, negative for tumor - Bilateral fallopian tubes, negative for tumor - Multiple leiomyomata (up to 8.5 cm)   B: Lymph node, aortocaval, excision - Metastatic carcinoma involving 1 lymph node (0.4 cm) (1/1)   C: Lymph node, left pelvic, excision -Metastatic carcinoma involving 1 lymph node (0.3 cm) (1/1)   Diagnosis Comment    The majority of this tumor is composed of well differentiated (overall grade 2, but with areas consistent with grade 1) endometrioid  adenocarcinoma with squamous and mucinous differentiation (approximately 70%); multifocally there is abrupt transition to an undifferentiated malignancy with a high-grade monotonous noncohesive appearance (best seen in slide A2) consistent with undifferentiated carcinoma.  The overall morphologic and immunophenotypic findings are consistent with a dedifferentiated endometrial carcinoma.   Of note, the carcinoma present in the lymph nodes is well differentiated endometrioid carcinoma and the metastatic deposit in the ovary is undifferentiated carcinoma. Carcinoma is present less than 1 mm from uterine corpus serosa (in the lower uterine segment).  The assessment of the paracervical soft tissue margins is challenging due to the disrupted nature of the specimen in this area, however tumor appears present less than 1 mm from paracervical soft tissue.  No definite parametrial invasion is identified.   Immunohistochemistry performed at [facility] (with appropriate controls) demonstrates the following immunoprofile in cells of interest: Well differentiated endometrioid carcinoma: CK AE1/AE3: Positive ER: Positive (>90%, strong staining) P53: Favor wild-type (see comment below)   Dedifferentiated carcinoma: CK AE1/AE3: Very rare cells, predominantly negative ER: Negative P53: Small focus of mutant type staining in a background of wild-type staining (see comment below)   P53 staining comment: In the well differentiated carcinoma component the staining pattern is predominantly wild-type although focally there is slightly increased levels of staining noted.  In the dedifferentiated tumor component, most of the tissue exhibits wild-type pattern staining, however there is a distinct small area that exhibits increased/overexpressed mutant pattern p53 staining.    Immunohistochemistry testing for mismatch repair (MMR) proteins is performed on block A2:   MLH1:  Loss of nuclear expression MSH2:  Intact nuclear  expression MSH6:  Intact nuclear expression PMS2:  Loss of nuclear expression   Loss of nuclear expression of MLH1 and PMS2 can be a result of sporadic microsatellite instability (via hypermethylation of the MLH1 promoter region) or due to Lynch syndrome / [place]. Materials will be submitted to the Molecular Pathology Laboratory [phone]) for [facility] promoter hypermethylation testing. These results will be issued in a separate report.      03/05/2021 Initial Diagnosis   Uterine cancer ([place])   03/05/2021 Cancer Staging   Staging form: Corpus Uteri - Carcinoma and Carcinosarcoma, AJCC 8th Edition - Pathologic stage from 03/05/2021: Stage IIIC2 (pT3, pN2a, cM0) - Signed by Candice Lark, MD on 03/05/2021 Stage prefix: Initial diagnosis    03/11/2021 Imaging   1. Status post interval hysterectomy and oophorectomy. 2. There is a rim enhancing soft tissue mass centered in the vagina, measuring approximately 4.4 x 3.1 cm. This is new, or significantly enlarged compared to prior examination dated 01/05/2021. Findings are concerning for malignant involvement. Correlate with physical examination. Contrast enhanced MRI may be helpful to further detail soft tissue anatomy of the low pelvis if indicated. 3. There is at least one newly enlarged left iliac lymph node measuring up to 1.4 x 1.3 cm, nonspecific although concerning for nodal metastatic disease. 4. Fluid attenuation lymphocele or seroma adjacent to surgical clip at the  level right ovarian vein. 5. Status post interval midline laparotomy. Subcutaneous fluid within the inferior aspect of the wound. The presence or absence of infection is not established by CT.     03/23/2021 -  Chemotherapy   Patient is on Treatment Plan : UTERINE Carboplatin AUC 6 / Paclitaxel q21d     03/23/2021 Procedure   Placement of a subcutaneous power-injectable port device. Catheter tip at the superior cavoatrial junction     PHYSICAL EXAMINATION: ECOG PERFORMANCE STATUS:  1 - Symptomatic but completely ambulatory  Vitals:   06/14/21 0807  BP: (!) 149/78  Pulse: 70  Resp: 17  Temp: 97.6 F (36.4 C)  SpO2: 100%   Filed Weights   06/14/21 0807  Weight: 159 lb 6.4 oz (72.3 kg)    GENERAL:alert, no distress and comfortable SKIN: skin color, texture, turgor are normal, no rashes or significant lesions EYES: normal, Conjunctiva are pink and non-injected, sclera clear OROPHARYNX:no exudate, no erythema and lips, buccal mucosa, and tongue normal  NECK: supple, thyroid normal size, non-tender, without nodularity LYMPH:  no palpable lymphadenopathy in the cervical, axillary or inguinal LUNGS: clear to auscultation and percussion with normal breathing effort HEART: regular rate & rhythm and no murmurs and no lower extremity edema ABDOMEN:abdomen soft, non-tender and normal bowel sounds Musculoskeletal:no cyanosis of digits and no clubbing  NEURO: alert & oriented x 3 with fluent speech, no focal motor/sensory deficits  LABORATORY DATA:  I have reviewed the data as listed    Component Value Date/Time   NA 136 06/14/2021 0752   K 4.1 06/14/2021 0752   CL 104 06/14/2021 0752   CO2 23 06/14/2021 0752   GLUCOSE 97 06/14/2021 0752   BUN 14 06/14/2021 0752   CREATININE 0.74 06/14/2021 0752   CALCIUM 9.2 06/14/2021 0752   PROT 7.9 06/14/2021 0752   ALBUMIN 3.5 06/14/2021 0752   AST 40 06/14/2021 0752   ALT 52 (H) 06/14/2021 0752   ALKPHOS 123 06/14/2021 0752   BILITOT 0.3 06/14/2021 0752   GFRNONAA >60 06/14/2021 0752    No results found for: SPEP, UPEP  Lab Results  Component Value Date   WBC 4.1 06/14/2021   NEUTROABS 2.0 06/14/2021   HGB 10.1 (L) 06/14/2021   HCT 32.0 (L) 06/14/2021   MCV 68.4 (L) 06/14/2021   PLT 220 06/14/2021      Chemistry      Component Value Date/Time   NA 136 06/14/2021 0752   K 4.1 06/14/2021 0752   CL 104 06/14/2021 0752   CO2 23 06/14/2021 0752   BUN 14 06/14/2021 0752   CREATININE 0.74 06/14/2021 0752       Component Value Date/Time   CALCIUM 9.2 06/14/2021 0752   ALKPHOS 123 06/14/2021 0752   AST 40 06/14/2021 0752   ALT 52 (H) 06/14/2021 0752   BILITOT 0.3 06/14/2021 0752

## 2021-06-14 NOTE — Assessment & Plan Note (Signed)
She remains anemic I will order iron studies, vitamin B12 and thyroid function for further evaluation She will also receive B12 injection today

## 2021-06-14 NOTE — Assessment & Plan Note (Signed)
She has lost some weight through significant dietary modification She denies side effects from treatment so far We will proceed with treatment as scheduled and I need to recalculate the dose of her treatment due to recent weight change

## 2021-06-14 NOTE — Progress Notes (Signed)
TSH iron studies and B-12 was drawn through portacath

## 2021-06-14 NOTE — Assessment & Plan Note (Signed)
She denies worsening neuropathy Observe closely

## 2021-06-14 NOTE — Patient Instructions (Signed)
[facility] MEDICAL ONCOLOGY   Discharge Instructions: Thank you for choosing [facility] to provide your oncology and hematology care.   If you have a lab appointment with the Cancer Center, please go directly to the Cancer Center and check in at the registration area.   Wear comfortable clothing and clothing appropriate for easy access to any Portacath or PICC line.   We strive to give you quality time with your provider. You may need to reschedule your appointment if you arrive late (15 or more minutes).  Arriving late affects you and other patients whose appointments are after yours.  Also, if you miss three or more appointments without notifying the office, you may be dismissed from the clinic at the provider's discretion.      For prescription refill requests, have your pharmacy contact our office and allow 72 hours for refills to be completed.    Today you received the following chemotherapy and/or immunotherapy agents: paclitaxel and carboplatin.      To help prevent nausea and vomiting after your treatment, we encourage you to take your nausea medication as directed.  BELOW ARE SYMPTOMS THAT SHOULD BE REPORTED IMMEDIATELY: *FEVER GREATER THAN 100.4 F (38 C) OR HIGHER *CHILLS OR SWEATING *NAUSEA AND VOMITING THAT IS NOT CONTROLLED WITH YOUR NAUSEA MEDICATION *UNUSUAL SHORTNESS OF BREATH *UNUSUAL BRUISING OR BLEEDING *URINARY PROBLEMS (pain or burning when urinating, or frequent urination) *BOWEL PROBLEMS (unusual diarrhea, constipation, pain near the anus) TENDERNESS IN MOUTH AND THROAT WITH OR WITHOUT PRESENCE OF ULCERS (sore throat, sores in mouth, or a toothache) UNUSUAL RASH, SWELLING OR PAIN  UNUSUAL VAGINAL DISCHARGE OR ITCHING   Items with * indicate a potential emergency and should be followed up as soon as possible or go to the Emergency Department if any problems should occur.  Please show the CHEMOTHERAPY ALERT CARD or IMMUNOTHERAPY ALERT  CARD at check-in to the Emergency Department and triage nurse.  Should you have questions after your visit or need to cancel or reschedule your appointment, please contact [facility]COL[facility]  Dept: [phone]  and follow the prompts.  Office hours are 8:00 a.m. to 4:30 p.m. Monday - Friday. Please note that voicemails left after 4:00 p.m. may not be returned until the following business day.  We are closed weekends and major holidays. You have access to a nurse at all times for urgent questions. Please call the main number to the clinic Dept: [phone] and follow the prompts.   For any non-urgent questions, you may also contact your provider using MyChart. We now offer e-Visits for anyone 18 and older to request care online for non-urgent symptoms. For details visit [email or web address].   Also download the MyChart app! Go to the app store, search "MyChart", open the app, select [facility], and log in with your MyChart username and password.  Due to Covid, a mask is required upon entering the hospital/clinic. If you do not have a mask, one will be given to you upon arrival. For doctor visits, patients may have 1 support person aged 18 or older with them. For treatment visits, patients cannot have anyone with them due to current Covid guidelines and our immunocompromised population.   

## 2021-07-09 ENCOUNTER — Other Ambulatory Visit: Payer: Self-pay | Admitting: Hematology and Oncology

## 2021-07-09 ENCOUNTER — Encounter: Payer: Self-pay | Admitting: Hematology and Oncology

## 2021-07-09 ENCOUNTER — Other Ambulatory Visit: Payer: Self-pay

## 2021-07-09 ENCOUNTER — Inpatient Hospital Stay: Payer: Medicare Other

## 2021-07-09 ENCOUNTER — Inpatient Hospital Stay: Payer: Medicare Other | Admitting: Hematology and Oncology

## 2021-07-09 DIAGNOSIS — G609 Hereditary and idiopathic neuropathy, unspecified: Secondary | ICD-10-CM

## 2021-07-09 DIAGNOSIS — D539 Nutritional anemia, unspecified: Secondary | ICD-10-CM

## 2021-07-09 DIAGNOSIS — C55 Malignant neoplasm of uterus, part unspecified: Secondary | ICD-10-CM

## 2021-07-09 LAB — CBC WITH DIFFERENTIAL (CANCER CENTER ONLY)
Abs Immature Granulocytes: 0.02 10*3/uL (ref 0.00–0.07)
Basophils Absolute: 0 10*3/uL (ref 0.0–0.1)
Basophils Relative: 1 %
Eosinophils Absolute: 0.1 10*3/uL (ref 0.0–0.5)
Eosinophils Relative: 1 %
HCT: 31.4 % — ABNORMAL LOW (ref 36.0–46.0)
Hemoglobin: 9.9 g/dL — ABNORMAL LOW (ref 12.0–15.0)
Immature Granulocytes: 0 %
Lymphocytes Relative: 24 %
Lymphs Abs: 1.3 10*3/uL (ref 0.7–4.0)
MCH: 22.2 pg — ABNORMAL LOW (ref 26.0–34.0)
MCHC: 31.5 g/dL (ref 30.0–36.0)
MCV: 70.4 fL — ABNORMAL LOW (ref 80.0–100.0)
Monocytes Absolute: 0.5 10*3/uL (ref 0.1–1.0)
Monocytes Relative: 9 %
Neutro Abs: 3.4 10*3/uL (ref 1.7–7.7)
Neutrophils Relative %: 65 %
Platelet Count: 234 10*3/uL (ref 150–400)
RBC: 4.46 MIL/uL (ref 3.87–5.11)
RDW: 21.1 % — ABNORMAL HIGH (ref 11.5–15.5)
WBC Count: 5.3 10*3/uL (ref 4.0–10.5)
nRBC: 0 % (ref 0.0–0.2)

## 2021-07-09 LAB — CMP (CANCER CENTER ONLY)
ALT: 13 U/L (ref 0–44)
AST: 12 U/L — ABNORMAL LOW (ref 15–41)
Albumin: 3.9 g/dL (ref 3.5–5.0)
Alkaline Phosphatase: 103 U/L (ref 38–126)
Anion gap: 9 (ref 5–15)
BUN: 11 mg/dL (ref 8–23)
CO2: 24 mmol/L (ref 22–32)
Calcium: 9.5 mg/dL (ref 8.9–10.3)
Chloride: 105 mmol/L (ref 98–111)
Creatinine: 0.7 mg/dL (ref 0.44–1.00)
GFR, Estimated: >60 mL/min (ref >60)
Glucose, Bld: 106 mg/dL — ABNORMAL HIGH (ref 70–99)
Potassium: 3.8 mmol/L (ref 3.5–5.1)
Sodium: 138 mmol/L (ref 135–145)
Total Bilirubin: 0.5 mg/dL (ref 0.3–1.2)
Total Protein: 7.6 g/dL (ref 6.5–8.1)

## 2021-07-09 MED ORDER — SODIUM CHLORIDE 0.9% FLUSH
10.0000 mL | Freq: Once | INTRAVENOUS | Status: AC
  Administered 2021-07-09: 08:00:00 10 mL

## 2021-07-09 MED ORDER — FAMOTIDINE 20 MG IN NS 100 ML IVPB
20.0000 mg | Freq: Once | INTRAVENOUS | Status: AC
  Administered 2021-07-09: 09:00:00 20 mg via INTRAVENOUS
  Filled 2021-07-09: qty 100

## 2021-07-09 MED ORDER — SODIUM CHLORIDE 0.9% FLUSH
10.0000 mL | INTRAVENOUS | Status: DC | PRN
  Administered 2021-07-09: 14:00:00 10 mL

## 2021-07-09 MED ORDER — DEXAMETHASONE SODIUM PHOSPHATE 100 MG/10ML IJ SOLN
10.0000 mg | Freq: Once | INTRAMUSCULAR | Status: AC
  Administered 2021-07-09: 09:00:00 10 mg via INTRAVENOUS
  Filled 2021-07-09: qty 10

## 2021-07-09 MED ORDER — HEPARIN SOD (PORK) LOCK FLUSH 100 UNIT/ML IV SOLN
500.0000 [IU] | Freq: Once | INTRAVENOUS | Status: AC | PRN
  Administered 2021-07-09: 14:00:00 500 [IU]

## 2021-07-09 MED ORDER — SODIUM CHLORIDE 0.9 % IV SOLN
175.0000 mg/m2 | Freq: Once | INTRAVENOUS | Status: AC
  Administered 2021-07-09: 10:00:00 312 mg via INTRAVENOUS
  Filled 2021-07-09: qty 52

## 2021-07-09 MED ORDER — SODIUM CHLORIDE 0.9 % IV SOLN
513.6000 mg | Freq: Once | INTRAVENOUS | Status: AC
  Administered 2021-07-09: 14:00:00 510 mg via INTRAVENOUS
  Filled 2021-07-09: qty 51

## 2021-07-09 MED ORDER — DIPHENHYDRAMINE HCL 50 MG/ML IJ SOLN
25.0000 mg | Freq: Once | INTRAMUSCULAR | Status: AC
  Administered 2021-07-09: 09:00:00 25 mg via INTRAVENOUS
  Filled 2021-07-09: qty 1

## 2021-07-09 MED ORDER — SODIUM CHLORIDE 0.9 % IV SOLN
150.0000 mg | Freq: Once | INTRAVENOUS | Status: AC
  Administered 2021-07-09: 09:00:00 150 mg via INTRAVENOUS
  Filled 2021-07-09: qty 150

## 2021-07-09 MED ORDER — PALONOSETRON HCL INJECTION 0.25 MG/5ML
0.2500 mg | Freq: Once | INTRAVENOUS | Status: AC
  Administered 2021-07-09: 09:00:00 0.25 mg via INTRAVENOUS
  Filled 2021-07-09: qty 5

## 2021-07-09 MED ORDER — SODIUM CHLORIDE 0.9 % IV SOLN: Freq: Once | INTRAVENOUS | Status: AC

## 2021-07-09 NOTE — Patient Instructions (Signed)
[facility] ONCOLOGY  Discharge Instructions: Thank you for choosing [facility] to provide your oncology and hematology care.   If you have a lab appointment with the [facility], please go directly to the [facility] and check in at the registration area.   Wear comfortable clothing and clothing appropriate for easy access to any Portacath or PICC line.   We strive to give you quality time with your provider. You may need to reschedule your appointment if you arrive late (15 or more minutes).  Arriving late affects you and other patients whose appointments are after yours.  Also, if you miss three or more appointments without notifying the office, you may be dismissed from the clinic at the providers discretion.      For prescription refill requests, have your pharmacy contact our office and allow 72 hours for refills to be completed.    Today you received the following chemotherapy and/or immunotherapy agents Carboplatin and taxol      To help prevent nausea and vomiting after your treatment, we encourage you to take your nausea medication as directed.  BELOW ARE SYMPTOMS THAT SHOULD BE REPORTED IMMEDIATELY: *FEVER GREATER THAN 100.4 F (38 C) OR HIGHER *CHILLS OR SWEATING *NAUSEA AND VOMITING THAT IS NOT CONTROLLED WITH YOUR NAUSEA MEDICATION *UNUSUAL SHORTNESS OF BREATH *UNUSUAL BRUISING OR BLEEDING *URINARY PROBLEMS (pain or burning when urinating, or frequent urination) *BOWEL PROBLEMS (unusual diarrhea, constipation, pain near the anus) TENDERNESS IN MOUTH AND THROAT WITH OR WITHOUT PRESENCE OF ULCERS (sore throat, sores in mouth, or a toothache) UNUSUAL RASH, SWELLING OR PAIN  UNUSUAL VAGINAL DISCHARGE OR ITCHING   Items with * indicate a potential emergency and should be followed up as soon as possible or go to the Emergency Department if any problems should occur.  Please show the CHEMOTHERAPY ALERT CARD or IMMUNOTHERAPY ALERT CARD at  check-in to the Emergency Department and triage nurse.  Should you have questions after your visit or need to cancel or reschedule your appointment, please contact [facility]  Dept: [phone]  and follow the prompts.  Office hours are 8:00 a.m. to 4:30 p.m. Monday - Friday. Please note that voicemails left after 4:00 p.m. may not be returned until the following business day.  We are closed weekends and major holidays. You have access to a nurse at all times for urgent questions. Please call the main number to the clinic Dept: [phone] and follow the prompts.   For any non-urgent questions, you may also contact your provider using MyChart. We now offer e-Visits for anyone 75 and older to request care online for non-urgent symptoms. For details visit [email or web address].   Also download the MyChart app! Go to the app store, search "MyChart", open the app, select [facility], and log in with your MyChart username and password.  Due to Covid, a mask is required upon entering the hospital/clinic. If you do not have a mask, one will be given to you upon arrival. For doctor visits, patients may have 1 support person aged 53 or older with them. For treatment visits, patients cannot have anyone with them due to current Covid guidelines and our immunocompromised population.

## 2021-07-09 NOTE — Assessment & Plan Note (Signed)
Overall, she tolerated last cycle of treatment well We will proceed with treatment without delay I plan to order CT imaging For objective assessment of response to treatment

## 2021-07-09 NOTE — Progress Notes (Signed)
[facility] OFFICE PROGRESS NOTE  Patient Care Team: Default, Provider, MD as PCP - General Candice Parks, Candice Cotta, RN as Oncology Nurse Navigator (Oncology)  ASSESSMENT & PLAN:  Uterine cancer ([place]) Overall, she tolerated last cycle of treatment well We will proceed with treatment without delay I plan to order CT imaging For objective assessment of response to treatment  Deficiency anemia She remains anemic This is despite adequate vitamin B12 and iron replacement therapy She is not symptomatic Observed only  Idiopathic neuropathy She denies worsening neuropathy Observe closely  No orders of the defined types were placed in this encounter.   All questions were answered. The patient knows to call the clinic with any problems, questions or concerns. The total time spent in the appointment was 30 minutes encounter with patients including review of chart and various tests results, discussions about plan of care and coordination of care plan   Candice Lark, MD 07/09/2021 5:44 PM  INTERVAL HISTORY: Please see below for problem oriented charting. she returns for treatment follow-up to be seen prior to final treatment with carboplatin and paclitaxel She denies worsening peripheral neuropathy No recent nausea or changes in bowel habits The patient denies any recent signs or symptoms of bleeding such as spontaneous epistaxis, hematuria or hematochezia.   REVIEW OF SYSTEMS:   Constitutional: Denies fevers, chills or abnormal weight loss Eyes: Denies blurriness of vision Ears, nose, mouth, throat, and face: Denies mucositis or sore throat Respiratory: Denies cough, dyspnea or wheezes Cardiovascular: Denies palpitation, chest discomfort or lower extremity swelling Gastrointestinal:  Denies nausea, heartburn or change in bowel habits Skin: Denies abnormal skin rashes Lymphatics: Denies new lymphadenopathy or easy bruising Behavioral/Psych: Mood is stable, no new changes  All  other systems were reviewed with the patient and are negative.  I have reviewed the past medical history, past surgical history, social history and family history with the patient and they are unchanged from previous note.  ALLERGIES:  has No Known Allergies.  MEDICATIONS:  Current Outpatient Medications  Medication Sig Dispense Refill   dexamethasone (DECADRON) 4 MG tablet Take 2 tabs at the night before and 2 tab the morning of chemotherapy, every 3 weeks, by mouth x 6 cycles 36 tablet 6   lidocaine-prilocaine (EMLA) cream Apply to affected area once 30 g 3   ondansetron (ZOFRAN) 8 MG tablet Take 1 tablet (8 mg total) by mouth every 8 (eight) hours as needed. 30 tablet 1   prochlorperazine (COMPAZINE) 10 MG tablet Take 1 tablet (10 mg total) by mouth every 6 (six) hours as needed (Nausea or vomiting). 30 tablet 1   No current facility-administered medications for this visit.   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   Uterine cancer ([place])  01/05/2021 Imaging   Outside CT imaging  Bilateral hydronephrosis related extrinsic compression on the ureters by the enlarged uterus.   Multiple calcified uterine fibroids although peripherally enhancing diffuse decreased attenuation is noted centrally within the uterus highly suspicious for endometrial neoplasm  given the current history of vaginal bleeding. Further evaluation is recommended. MRI may be helpful. Additionally tissue  sampling would likely be helpful.   Hypodensity in the pancreas at the junction of the head and body of the pancreas. This may be related to volume averaging although the possibility of a cystic lesion could not be totally excluded. MRI is recommended for further evaluation.    01/05/2021 Initial Diagnosis   The patient reported at least 1 year of postmenopausal bleeding.  She does not have a primary care provider or gynecologist.  She had known fibroids and had noted that she needed a hysterectomy but had not gotten around to seeking care for this.  She presented to the [facility] emergency department on January 05, 2021 with new onset right lower quadrant pain and this prompted a CT scan of the abdomen and pelvis to be performed on 01/05/2021.     01/14/2021 Pathology Results   FINAL MICROSCOPIC DIAGNOSIS:   A. CERVIX, ENDOCERVIX, BIOPSY:  -  Endometrioid carcinoma, FIGO grade 1  -  See comment    01/26/2021 Imaging   MRI abdomen  1. No suspicious pancreatic mass identified. 2. Moderate to severe bilateral hydroureteronephrosis, similar to recent CT the abdomen and pelvis, presumably related to extrinsic ureteral compression from the patient's enlarged uterus, as demonstrated on recent CT the abdomen and pelvis 01/05/2021.   02/01/2021 Surgery   Surgery in [facility]  Date of Service: February 01, 2021 3:13 PM  Preoperative Diagnoses: Endometrial cancer  Postoperative Diagnoses: Endometrial cancer  Procedures: Total abdominal hysterectomy, bilateral salpingo-oophorectomy, left pelvic lymphadenectomy, right para-aortic lymphadenectomy  Surgeon: Everitt Amber, MD  Findings: Tumor appreciated in the cervical canal. Large, broad, multi fibroid uterus with a calcified fibroid filling the posterior cul de sac, an anterior lower uterine segment fibroid, and bilateral fibroids near the level of the cardinal ligament. No evidence of intraperitoneal disease. Normal appearing bilateral tubes and ovaries. Bilateral  hydroureter from uterine compression. Tumor excreted from specimen into the pelvis at time of removal. An approximately 2cm enlarged left pelvic lymph node and a 1cm enlarged right aorta caval lymph node were identified.     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) - Right ovary, negative for tumor - Bilateral fallopian tubes, negative for tumor - Multiple leiomyomata (up to 8.5 cm)   B: Lymph node, aortocaval, excision - Metastatic carcinoma involving 1 lymph node (0.4 cm) (1/1)   C: Lymph node, left pelvic, excision -Metastatic carcinoma involving 1 lymph node (0.3 cm) (1/1)   Diagnosis Comment    The majority of this tumor is composed of well differentiated (overall grade 2, but with areas consistent with grade 1) endometrioid adenocarcinoma with squamous and mucinous differentiation (approximately 70%); multifocally there is abrupt transition to an undifferentiated malignancy with a high-grade monotonous noncohesive appearance (best seen in slide A2) consistent with undifferentiated carcinoma.  The overall morphologic and immunophenotypic findings are consistent with a dedifferentiated endometrial carcinoma.   Of note, the carcinoma present in the lymph nodes is well differentiated endometrioid carcinoma and the metastatic deposit in the ovary is undifferentiated carcinoma. Carcinoma is present less than 1 mm from uterine corpus serosa (in the lower uterine segment).  The assessment  of the paracervical soft tissue margins is challenging due to the disrupted nature of the specimen in this area, however tumor appears present less than 1 mm from  paracervical soft tissue.  No definite parametrial invasion is identified.   Immunohistochemistry performed at [facility] (with appropriate controls) demonstrates the following immunoprofile in cells of interest: Well differentiated endometrioid carcinoma: CK AE1/AE3: Positive ER: Positive (>90%, strong staining) P53: Favor wild-type (see comment below)   Dedifferentiated carcinoma: CK AE1/AE3: Very rare cells, predominantly negative ER: Negative P53: Small focus of mutant type staining in a background of wild-type staining (see comment below)   P53 staining comment: In the well differentiated carcinoma component the staining pattern is predominantly wild-type although focally there is slightly increased levels of staining noted.  In the dedifferentiated tumor component, most of the tissue exhibits wild-type pattern staining, however there is a distinct small area that exhibits increased/overexpressed mutant pattern p53 staining.    Immunohistochemistry testing for mismatch repair (MMR) proteins is performed on block A2:   MLH1:  Loss of nuclear expression MSH2:  Intact nuclear expression MSH6:  Intact nuclear expression PMS2:  Loss of nuclear expression   Loss of nuclear expression of MLH1 and PMS2 can be a result of sporadic microsatellite instability (via hypermethylation of the MLH1 promoter region) or due to Lynch syndrome / [place]. Materials will be submitted to the Molecular Pathology Laboratory [phone]) for [facility] promoter hypermethylation testing. These results will be issued in a separate report.      03/05/2021 Initial Diagnosis   Uterine cancer ([place])   03/05/2021 Cancer Staging   Staging form: Corpus Uteri - Carcinoma and Carcinosarcoma, AJCC 8th Edition - Pathologic stage from 03/05/2021: Stage IIIC2 (pT3, pN2a, cM0) - Signed by Candice Lark, MD on 03/05/2021 Stage prefix: Initial diagnosis    03/11/2021 Imaging   1. Status post interval hysterectomy and oophorectomy. 2. There  is a rim enhancing soft tissue mass centered in the vagina, measuring approximately 4.4 x 3.1 cm. This is new, or significantly enlarged compared to prior examination dated 01/05/2021. Findings are concerning for malignant involvement. Correlate with physical examination. Contrast enhanced MRI may be helpful to further detail soft tissue anatomy of the low pelvis if indicated. 3. There is at least one newly enlarged left iliac lymph node measuring up to 1.4 x 1.3 cm, nonspecific although concerning for nodal metastatic disease. 4. Fluid attenuation lymphocele or seroma adjacent to surgical clip at the level right ovarian vein. 5. Status post interval midline laparotomy. Subcutaneous fluid within the inferior aspect of the wound. The presence or absence of infection is not established by CT.     03/23/2021 -  Chemotherapy   Patient is on Treatment Plan : UTERINE Carboplatin AUC 6 / Paclitaxel q21d     03/23/2021 Procedure   Placement of a subcutaneous power-injectable port device. Catheter tip at the superior cavoatrial junction     PHYSICAL EXAMINATION: ECOG PERFORMANCE STATUS: 1 - Symptomatic but completely ambulatory  Vitals:   07/09/21 0811  BP: 139/81  Pulse: 86  Resp: 18  Temp: (!) 97.5 F (36.4 C)  SpO2: 100%   Filed Weights   07/09/21 0811  Weight: 165 lb (74.8 kg)    GENERAL:alert, no distress and comfortable SKIN: skin color, texture, turgor are normal, no rashes or significant lesions EYES: normal, Conjunctiva are pink and non-injected, sclera clear OROPHARYNX:no exudate, no erythema and lips, buccal mucosa, and tongue normal  NECK: supple, thyroid normal size, non-tender, without nodularity LYMPH:  no palpable  lymphadenopathy in the cervical, axillary or inguinal LUNGS: clear to auscultation and percussion with normal breathing effort HEART: regular rate & rhythm and no murmurs and no lower extremity edema ABDOMEN:abdomen soft, non-tender and normal bowel  sounds Musculoskeletal:no cyanosis of digits and no clubbing  NEURO: alert & oriented x 3 with fluent speech, no focal motor/sensory deficits  LABORATORY DATA:  I have reviewed the data as listed    Component Value Date/Time   NA 138 07/09/2021 0742   K 3.8 07/09/2021 0742   CL 105 07/09/2021 0742   CO2 24 07/09/2021 0742   GLUCOSE 106 (H) 07/09/2021 0742   BUN 11 07/09/2021 0742   CREATININE 0.70 07/09/2021 0742   CALCIUM 9.5 07/09/2021 0742   PROT 7.6 07/09/2021 0742   ALBUMIN 3.9 07/09/2021 0742   AST 12 (L) 07/09/2021 0742   ALT 13 07/09/2021 0742   ALKPHOS 103 07/09/2021 0742   BILITOT 0.5 07/09/2021 0742   GFRNONAA >60 07/09/2021 0742    No results found for: SPEP, UPEP  Lab Results  Component Value Date   WBC 5.3 07/09/2021   NEUTROABS 3.4 07/09/2021   HGB 9.9 (L) 07/09/2021   HCT 31.4 (L) 07/09/2021   MCV 70.4 (L) 07/09/2021   PLT 234 07/09/2021      Chemistry      Component Value Date/Time   NA 138 07/09/2021 0742   K 3.8 07/09/2021 0742   CL 105 07/09/2021 0742   CO2 24 07/09/2021 0742   BUN 11 07/09/2021 0742   CREATININE 0.70 07/09/2021 0742      Component Value Date/Time   CALCIUM 9.5 07/09/2021 0742   ALKPHOS 103 07/09/2021 0742   AST 12 (L) 07/09/2021 0742   ALT 13 07/09/2021 0742   BILITOT 0.5 07/09/2021 0638

## 2021-07-09 NOTE — Assessment & Plan Note (Signed)
She remains anemic This is despite adequate vitamin B12 and iron replacement therapy She is not symptomatic Observed only

## 2021-07-09 NOTE — Assessment & Plan Note (Signed)
She denies worsening neuropathy Observe closely

## 2021-07-19 ENCOUNTER — Encounter: Payer: Self-pay | Admitting: Hematology and Oncology

## 2021-07-26 ENCOUNTER — Telehealth: Payer: Self-pay

## 2021-07-26 NOTE — Telephone Encounter (Signed)
Returned her call. One week ago she developed a protruding nodule area in her vagina. She has a little blood from that area when she touches the area. She is also complaining of itching in vaginal area.  She is asking for appt with you? Or your recommendations.

## 2021-07-26 NOTE — Telephone Encounter (Signed)
She is scheduled 1/18 with Dr. Berline Lopes.  Just FYI

## 2021-07-26 NOTE — Telephone Encounter (Signed)
I think she should be seen at the GYN clinic, either Dr. Lahoma Crocker or Dr. Berline Lopes

## 2021-07-28 ENCOUNTER — Encounter: Payer: Self-pay | Admitting: Gynecologic Oncology

## 2021-07-28 ENCOUNTER — Other Ambulatory Visit: Payer: Self-pay

## 2021-07-28 ENCOUNTER — Inpatient Hospital Stay: Payer: Medicare PPO | Attending: Gynecologic Oncology | Admitting: Gynecologic Oncology

## 2021-07-28 VITALS — BP 100/72 | HR 79 | Temp 98.2°F | Resp 18 | Ht 64.17 in | Wt 165.8 lb

## 2021-07-28 DIAGNOSIS — C775 Secondary and unspecified malignant neoplasm of intrapelvic lymph nodes: Secondary | ICD-10-CM | POA: Diagnosis not present

## 2021-07-28 DIAGNOSIS — N939 Abnormal uterine and vaginal bleeding, unspecified: Secondary | ICD-10-CM | POA: Diagnosis not present

## 2021-07-28 DIAGNOSIS — N898 Other specified noninflammatory disorders of vagina: Secondary | ICD-10-CM | POA: Diagnosis not present

## 2021-07-28 DIAGNOSIS — C7982 Secondary malignant neoplasm of genital organs: Secondary | ICD-10-CM | POA: Diagnosis not present

## 2021-07-28 DIAGNOSIS — Z90710 Acquired absence of both cervix and uterus: Secondary | ICD-10-CM | POA: Insufficient documentation

## 2021-07-28 DIAGNOSIS — C55 Malignant neoplasm of uterus, part unspecified: Secondary | ICD-10-CM | POA: Diagnosis not present

## 2021-07-28 DIAGNOSIS — L298 Other pruritus: Secondary | ICD-10-CM | POA: Diagnosis not present

## 2021-07-28 DIAGNOSIS — C541 Malignant neoplasm of endometrium: Secondary | ICD-10-CM | POA: Diagnosis not present

## 2021-07-28 DIAGNOSIS — C52 Malignant neoplasm of vagina: Secondary | ICD-10-CM | POA: Diagnosis not present

## 2021-07-28 DIAGNOSIS — Z510 Encounter for antineoplastic radiation therapy: Secondary | ICD-10-CM | POA: Insufficient documentation

## 2021-07-28 DIAGNOSIS — Z79899 Other long term (current) drug therapy: Secondary | ICD-10-CM | POA: Insufficient documentation

## 2021-07-28 DIAGNOSIS — Z90722 Acquired absence of ovaries, bilateral: Secondary | ICD-10-CM | POA: Diagnosis not present

## 2021-07-28 NOTE — Progress Notes (Signed)
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   Uterine cancer ([place])  01/05/2021 Imaging   Outside CT imaging  Bilateral hydronephrosis related extrinsic compression on the ureters by the enlarged uterus.   Multiple calcified uterine fibroids although peripherally enhancing diffuse decreased attenuation is noted centrally within the uterus highly suspicious for endometrial neoplasm given the current history of vaginal bleeding. Further evaluation is recommended. MRI may be helpful. Additionally tissue sampling would likely be helpful.   Hypodensity in the pancreas at the junction of the head and body of the pancreas. This may be related to volume averaging although the possibility of a cystic lesion could not be totally excluded. MRI is recommended for further evaluation.    01/05/2021 Initial Diagnosis   The patient reported at least 1 year of postmenopausal bleeding.  She does not have a primary care provider or gynecologist.  She had known fibroids and had noted that she needed a hysterectomy but had not gotten around to seeking care for this.  She presented to the [facility] emergency department on January 05, 2021 with new onset right lower quadrant pain and this prompted a CT scan of the abdomen and pelvis to be performed on 01/05/2021.     01/14/2021 Pathology Results   FINAL MICROSCOPIC DIAGNOSIS:   A. CERVIX, ENDOCERVIX, BIOPSY:  -  Endometrioid carcinoma, FIGO grade 1  -  See comment    01/26/2021 Imaging   MRI abdomen  1. No suspicious pancreatic mass identified. 2. Moderate to severe bilateral hydroureteronephrosis, similar to recent CT the  abdomen and pelvis, presumably related to extrinsic ureteral compression from the patient's enlarged uterus, as demonstrated on recent CT the abdomen and pelvis 01/05/2021.   02/01/2021 Surgery   Surgery in [facility]  Date of Service: February 01, 2021 3:13 PM  Preoperative Diagnoses: Endometrial cancer  Postoperative Diagnoses: Endometrial cancer  Procedures: Total abdominal hysterectomy, bilateral salpingo-oophorectomy, left pelvic lymphadenectomy, right para-aortic lymphadenectomy  Surgeon: Everitt Amber, MD  Findings: Tumor appreciated in the cervical canal. Large, broad, multi fibroid uterus with a calcified fibroid filling the posterior cul de sac, an anterior lower uterine segment fibroid, and bilateral fibroids near the level of the cardinal ligament. No evidence of intraperitoneal disease. Normal appearing bilateral tubes and ovaries. Bilateral hydroureter from uterine compression. Tumor excreted from specimen into the pelvis at time of removal. An approximately 2cm enlarged left pelvic lymph node and a 1cm enlarged right aorta caval lymph node were identified.     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) - Right ovary, negative for tumor - Bilateral fallopian tubes, negative for tumor - Multiple leiomyomata (up to 8.5 cm)   B: Lymph node, aortocaval, excision - Metastatic carcinoma involving 1 lymph node (0.4 cm) (1/1)   C: Lymph node, left pelvic, excision -Metastatic carcinoma involving 1 lymph node (0.3 cm) (1/1)   Diagnosis Comment    The majority of this tumor  is composed of well differentiated (overall grade 2, but with areas  consistent with grade 1) endometrioid adenocarcinoma with squamous and mucinous differentiation (approximately 70%); multifocally there is abrupt transition to an undifferentiated malignancy with a high-grade monotonous noncohesive appearance (best seen in slide A2) consistent with undifferentiated carcinoma.  The overall morphologic and immunophenotypic findings are consistent with a dedifferentiated endometrial carcinoma.   Of note, the carcinoma present in the lymph nodes is well differentiated endometrioid carcinoma and the metastatic deposit in the ovary is undifferentiated carcinoma. Carcinoma is present less than 1 mm from uterine corpus serosa (in the lower uterine segment).  The assessment of the paracervical soft tissue margins is challenging due to the disrupted nature of the specimen in this area, however tumor appears present less than 1 mm from paracervical soft tissue.  No definite parametrial invasion is identified.   Immunohistochemistry performed at [facility] (with appropriate controls) demonstrates the following immunoprofile in cells of interest: Well differentiated endometrioid carcinoma: CK AE1/AE3: Positive ER: Positive (>90%, strong staining) P53: Favor wild-type (see comment below)   Dedifferentiated carcinoma: CK AE1/AE3: Very rare cells, predominantly negative ER: Negative P53: Small focus of mutant type staining in a background of wild-type staining (see comment below)   P53 staining comment: In the well differentiated carcinoma component the staining pattern is predominantly wild-type although focally there is slightly increased levels of staining noted.  In the dedifferentiated tumor component, most of the tissue exhibits wild-type pattern staining, however there is a distinct small area that exhibits increased/overexpressed mutant pattern p53 staining.    Immunohistochemistry testing for mismatch repair (MMR) proteins is performed on block A2:   MLH1:  Loss of nuclear  expression MSH2:  Intact nuclear expression MSH6:  Intact nuclear expression PMS2:  Loss of nuclear expression   Loss of nuclear expression of MLH1 and PMS2 can be a result of sporadic microsatellite instability (via hypermethylation of the MLH1 promoter region) or due to Lynch syndrome / [place]. Materials will be submitted to the [facility] [phone]) for [facility] promoter hypermethylation testing. These results will be issued in a separate report.      03/05/2021 Initial Diagnosis   Uterine cancer ([place])   03/05/2021 Cancer Staging   Staging form: Corpus Uteri - Carcinoma and Carcinosarcoma, AJCC 8th Edition - Pathologic stage from 03/05/2021: Stage IIIC2 (pT3, pN2a, cM0) - Signed by [name], MD on 03/05/2021 Stage prefix: Initial diagnosis    03/11/2021 Imaging   1. Status post interval hysterectomy and oophorectomy. 2. There is a rim enhancing soft tissue mass centered in the vagina, measuring approximately 4.4 x 3.1 cm. This is new, or significantly enlarged compared to prior examination dated 01/05/2021. Findings are concerning for malignant involvement. Correlate with physical examination. Contrast enhanced MRI may be helpful to further detail soft tissue anatomy of the low pelvis if indicated. 3. There is at least one newly enlarged left iliac lymph node measuring up to 1.4 x 1.3 cm, nonspecific although concerning for nodal metastatic disease. 4. Fluid attenuation lymphocele or seroma adjacent to surgical clip at the level right ovarian vein. 5. Status post interval midline laparotomy. Subcutaneous fluid within the inferior aspect of the wound. The presence or absence of infection is not established by CT.     03/23/2021 -  Chemotherapy   Patient is on Treatment Plan : UTERINE Carboplatin AUC 6 / Paclitaxel q21d     03/23/2021 Procedure   Placement of a subcutaneous power-injectable port device. Catheter tip at the superior cavoatrial junction  Interval  History: Patient presents today after noting a "bump" in her vagina as well as some associated bleeding.  She denies bleeding at baseline, but if she puts her finger in the vagina to scratch this area, it causes some bleeding.  She notes intermittent pruritus within the vagina.  Denies any discharge but notes a "coffee odor".  Denies any urinary symptoms including pain or dysuria.  Denies any change to urine stream.  Endorses regular bowel function.  Denies any pelvic or abdominal pain.  Past Medical/Surgical History: Past Medical History:  Diagnosis Date   Anemia    taking iron    Past Surgical History:  Procedure Laterality Date   ABDOMINAL HYSTERECTOMY     IR IMAGING GUIDED PORT INSERTION  03/22/2021   WISDOM TOOTH EXTRACTION      Family History  Problem Relation Age of Onset   Ovarian cancer Sister    Colon cancer Neg Hx    Breast cancer Neg Hx    Endometrial cancer Neg Hx    Pancreatic cancer Neg Hx    Prostate cancer Neg Hx     Social History   Socioeconomic History   Marital status: Divorced    Spouse name: Not on file   Number of children: 0   Years of education: Not on file   Highest education level: Not on file  Occupational History   Not on file  Tobacco Use   Smoking status: Never    Passive exposure: Past   Smokeless tobacco: Never  Vaping Use   Vaping Use: Never used  Substance and Sexual Activity   Alcohol use: Yes    Comment: Occasional   Drug use: Never   Sexual activity: Not Currently  Other Topics Concern   Not on file  Social History Narrative   Not on file   Social Determinants of Health   Financial Resource Strain: Not on file  Food Insecurity: Not on file  Transportation Needs: Not on file  Physical Activity: Not on file  Stress: Not on file  Social Connections: Not on file    Current Medications:  Current Outpatient Medications:    acetaminophen (TYLENOL) 325 MG tablet, Take by mouth., Disp: , Rfl:    Ibuprofen 200 MG CAPS,  Take 200 mg by mouth every 8 (eight) hours as needed., Disp: , Rfl:    dexamethasone (DECADRON) 4 MG tablet, Take 2 tabs at the night before and 2 tab the morning of chemotherapy, every 3 weeks, by mouth x 6 cycles (Patient not taking: Reported on 07/28/2021), Disp: 36 tablet, Rfl: 6   lidocaine-prilocaine (EMLA) cream, Apply to affected area once (Patient not taking: Reported on 07/28/2021), Disp: 30 g, Rfl: 3   ondansetron (ZOFRAN) 8 MG tablet, Take 1 tablet (8 mg total) by mouth every 8 (eight) hours as needed. (Patient not taking: Reported on 07/28/2021), Disp: 30 tablet, Rfl: 1   prochlorperazine (COMPAZINE) 10 MG tablet, Take 1 tablet (10 mg total) by mouth every 6 (six) hours as needed (Nausea or vomiting). (Patient not taking: Reported on 07/28/2021), Disp: 30 tablet, Rfl: 1  Review of Systems: Denies appetite changes, fevers, chills, fatigue, unexplained weight changes. Denies hearing loss, neck lumps or masses, mouth sores, ringing in ears or voice changes. Denies cough or wheezing.  Denies shortness of breath. Denies chest pain or palpitations. Denies leg swelling. Denies abdominal distention, pain, blood in stools, constipation, diarrhea, nausea, vomiting, or early satiety. Denies pain with intercourse, dysuria, frequency, hematuria or incontinence. Denies hot  flashes.   Denies joint pain, back pain or muscle pain/cramps. Denies itching, rash, or wounds. Denies dizziness, headaches, numbness or seizures. Denies swollen lymph nodes or glands, denies easy bruising or bleeding. Denies anxiety, depression, confusion, or decreased concentration.  Physical Exam: BP 100/72 (BP Location: Right Arm, Patient Position: Sitting)    Pulse 79    Temp 98.2 F (36.8 C) (Oral)    Resp 18    Ht 5' 4.17" (1.63 m)    Wt 165 lb 12.8 oz (75.2 kg)    SpO2 100%    BMI 28.31 kg/m  General: Alert, oriented, no acute distress. HEENT: Normocephalic, atraumatic, sclera anicteric. Chest: Clear to auscultation  bilaterally.  No wheezes or rhonchi. Cardiovascular: Regular rate and rhythm, no murmurs. Abdomen: soft, nontender.  Normoactive bowel sounds.  No masses or hepatosplenomegaly appreciated.  Well-healed scar laparotomy incision.  GU: Normal appearing external genitalia without erythema, excoriation, or lesions.  With minimal separation of the labia, there is tumor evident just inferior to the urethra that is beefy red.  On bimanual exam, this tumor extends more than half the length of the vagina on the anterior surface, although most of the bulk of the mass is within the first 2 cm.  The mass easily bleeds with manipulation.  On speculum exam, the vaginal apex and upper 1-2 cm of the vagina are normal in appearance without any lesions noted.    Vaginal mass biopsy Preoperative diagnosis: Vaginal mass, suspected progression of endometrial cancer Postoperative diagnosis: Same as above Procedure: Vaginal mass biopsy Physician: Berline Lopes MD Estimated blood loss: 30 cc Procedure: After the procedure was discussed with the patient including risks and benefits, she gave verbal consent.  She was then placed in dorsolithotomy position and the vaginal lesion was well visualized.  It was cleansed with Betadine x3 and Tischler forceps were used to take a biopsy of the mass.  Biopsy was placed in formalin.  Silver nitrate as well as pressure were applied to achieve hemostasis.  Ultimately gauze was used to pack the area to apply pressure for 15 minutes.  When packing was removed, no significant bleeding noted.  Overall the patient tolerated the procedure well.  Laboratory & Radiologic Studies: None new  Assessment & Plan: Candice Parks is a 71 y.o. 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   Extensive extrauterine disease involvement including ovarian, cervical stromal and pelvic and aortic nodal disease. Has now completed 6 cycles of adjuvant  carboplatin and paclitaxel. Exam findings today worrisome for progression of disease within the vagina.  Biopsy performed.  Discussed my findings with the patient today.  I will call her with biopsy results.  If sent a message to both Dr. Alvy Bimler and Dr. Sondra Come.  We have worked to move the patient's posttreatment CT scan up.  I suspect she will have evidence of other metastatic disease.  Plan had previously been for external beam radiation after completion of treatment.  She will most certainly need this now as much for treatment but also to control growth of this lesion and bleeding from it; but she may need additional systemic therapy if there is evidence of persistent or progressive disease elsewhere.  She is a candidate for PLD1 inhibition in the future given her MMR-not preserved/MSI high phenotype/genotype.   28 minutes of total time was spent for this patient encounter, including preparation, face-to-face counseling with the patient and coordination of care, and documentation of the encounter.  Belenda Cruise  Berline Lopes, MD  Division of Gynecologic Oncology  Department of Obstetrics and Gynecology  [place] [facility]

## 2021-07-28 NOTE — Patient Instructions (Signed)
It was good to see you today.  I will call you with the biopsy results once I have them, hopefully in the next couple of days.  I suspect that this is your same cancer that I am now seeing in feeling in the vagina.  I have reached out to Dr. Alvy Bimler as well as to our radiation oncologist, Dr. Sondra Come.  I think that you will need radiation at some point to help control growth of the area in your vagina as well as bleeding from it.  I am having Santiago Glad work on moving your CT scan up.

## 2021-07-29 ENCOUNTER — Telehealth: Payer: Self-pay | Admitting: Oncology

## 2021-07-29 DIAGNOSIS — C541 Malignant neoplasm of endometrium: Secondary | ICD-10-CM

## 2021-07-29 NOTE — Telephone Encounter (Signed)
Called Candice Parks and advised her of appointment with Dr. Sondra Come tomorrow at 12:30 and for Dr. Alvy Bimler on Monday, 08/02/21 at 12:40.  Also reviewed her appointment for the CT scan on Saturday.  She verbalized understanding and agreement.

## 2021-07-29 NOTE — Progress Notes (Signed)
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   HISTORY OF PRESENT ILLNESS::Candice Parks is a 71 y.o. female who is s seen as a courtesy of Dr. Berline Lopes for an opinion concerning radiation therapy as part of management for her recurrent endometrial cancer. She presented to the ED on 01/05/21 with a several month history of RLQ abdominal pain and vaginal bleeding. On ED evaluation, a RLQ abdominal mass was palpated, described to be about the size of a softball. CT of the abdomen and pelvis performed showed multiple calcified peripherally enhancing uterine fibroids,  with diffuse decreased attenuation noted centrally within the uterus. Findings were overall highly suspicious for endometrial neoplasm, given reports of vaginal bleeding. CT also showed bilateral hydronephrosis, related extrinsic compression on the ureters by the enlarged uterus, and a hypodensity in the pancreas, (located about the junction of the head and body of the pancreas).  Subsequently, the patient was referred to Dr. Denman George on 01/14/21 for further evaluation. Pelvic exam performed during this visit revealed the cervix as displaced anteriorally and pushed towards pelvic floor. The cervical OS was also slightly open with some friable papillary tissue coming from the endocervix which was biopsied. Otherwise, the cervix was noted as palpably normal in its consistency and not obviously replaced by tumor. On examination, the uterus was noted as bulky, large, immobile, and filling the posterior cul-de-sac with  a calcified fibroid. The uterus was also noted to extend to at least the level of the umbilicus, and extensively involve the sidewall to sidewall space in the pelvis. Abdominal exam also revealed the palpable fibroid uterus in the lower abdomen, extending to the umbilicus. Following evaluation, and pending biopsy results, Dr. Denman George recommended total hysterectomy with BSO and possible lymphadenectomy.   Biopsy of the endocervix obtained by Dr. Denman George on 01/14/21 revealed FIGO grade 1 endometrioid carcinoma.  MRI of the abdomen on 01/26/21 demonstrated moderate to severe bilateral hydroureteronephrosis, appearing similar to recent CT of the abdomen and pelvis, and noted as presumably related to extrinsic ureteral compression from the patient's enlarged uterus, (also demonstrated on recent CT the abdomen and pelvis 01/05/2021). MRI also showed no evidence of a pancreatic mass.   The patient opted to proceed with total hysterectomy, BSO, and lymphadenectomies on 02/01/21. 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 Aortocaval and left pelvic lymph node excisions revealed metastatic carcinoma involving both lymph nodes, measuring 0.4 cm and 0.3 cm respectively.   Of note: on 02/08/21, the patient presented to Joylene John NP with mons swelling and difficulty emptying her bladder. The patient reported that she had in and out cathed herself on the day she returned home from her procedure, which yielded around 300-400 cc of urine, but did not cath after that time due to difficulty with the procedure and finding the urethra. Since that time, the patient reported that she has to void every hour and urinates around 75 to 100 cc of urine. Accordingly, foley cath was placed during this visit  which yielded an output of 400  cc of yellow cloudy urine with sediment. A urine sample was accordingly collected which revealed findings consistent with UTI.  The patient was instructed to keep the foley in for 1 week (prior to finding of UTI), however, she returned the following day requesting removal due to onset of moderate pelvic pressure and vulvar irritation. For UTI, the patient was prescribed macrobid 100 mg x 5 days.   CT of the abdomen and pelvis on 03/11/21 showed a new rim enhancing soft tissue mass centered in the vagina, measuring approximately 4.4 x 3.1 cm, concerning for malignant involvement. In addition, at least one newly enlarged left iliac lymph node was appreciated, measuring up to 1.4 x 1.3 cm, noted as nonspecific, though still concerning for nodal metastatic disease.  The patient agreed to proceed with adjuvant chemotherapy treatment consisting of carboplatin/paclitaxel, on 03/23/21 through 07/09/21 , under the care of Dr. Alvy Bimler. While undergoing systemic treatment, the patient developed combined B12 and iron deficiencies, which were managed with B12 injections and IV iron. She also lost some weight around her 5th cycle which was alleviated by a dose adjustment. Otherwise, the patient seemed to tolerate her treatments quite well.   In recent history, the patient presented to Dr.Tucker on 07/28/20 for evaluation of a new bump on her vagina and some associated bleeding caused by scratching the bump. Physical exam performed revealed a beefy red tumor just inferior to the urethra.  On bimanual exam, the tumor was noted to extend more than half the length of the vagina on the anterior surface, (though most of the the mass was focused within the first 2 cm). The mass was appreciated to bleed with manipulation.  On speculum exam, the vaginal apex and upper 1-2 cm of the vagina were normal in appearance without any lesions noted.  Accordingly, Dr. Berline Lopes collected a biopsy of the mass, though results are pending at this  time.   PREVIOUS RADIATION THERAPY: No  PAST MEDICAL HISTORY:  Past Medical History:  Diagnosis Date   Anemia    taking iron    PAST SURGICAL HISTORY: Past Surgical History:  Procedure Laterality Date   ABDOMINAL HYSTERECTOMY     IR IMAGING GUIDED PORT INSERTION  03/22/2021   WISDOM TOOTH EXTRACTION      FAMILY HISTORY:  Family History  Problem Relation Age of Onset   Ovarian cancer Sister    Colon cancer Neg Hx    Breast cancer Neg Hx    Endometrial cancer Neg Hx    Pancreatic cancer Neg Hx    Prostate cancer Neg Hx     SOCIAL HISTORY:  Social History   Tobacco Use   Smoking status: Never    Passive exposure: Past   Smokeless tobacco: Never  Vaping Use   Vaping Use: Never used  Substance Use Topics   Alcohol use: Yes    Comment: Occasional   Drug use: Never    ALLERGIES: No Known Allergies  MEDICATIONS:  Current Outpatient Medications  Medication Sig Dispense Refill   acetaminophen (TYLENOL) 325 MG tablet Take 650 mg by mouth as needed.     Ibuprofen 200 MG CAPS Take 200 mg by mouth every 8 (eight) hours as needed.     magnesium hydroxide (MILK OF MAGNESIA) 400 MG/5ML suspension Take 5 mLs by mouth daily as needed for mild constipation.     dexamethasone (DECADRON) 4 MG tablet Take 2 tabs at the night before and 2 tab the morning of  chemotherapy, every 3 weeks, by mouth x 6 cycles (Patient not taking: Reported on 07/28/2021) 36 tablet 6   lidocaine-prilocaine (EMLA) cream Apply to affected area once (Patient not taking: Reported on 07/28/2021) 30 g 3   ondansetron (ZOFRAN) 8 MG tablet Take 1 tablet (8 mg total) by mouth every 8 (eight) hours as needed. (Patient not taking: Reported on 07/28/2021) 30 tablet 1   prochlorperazine (COMPAZINE) 10 MG tablet Take 1 tablet (10 mg total) by mouth every 6 (six) hours as needed (Nausea or vomiting). (Patient not taking: Reported on 07/28/2021) 30 tablet 1   No current facility-administered medications for this encounter.     REVIEW OF SYSTEMS:  A 10+ POINT REVIEW OF SYSTEMS WAS OBTAINED including neurology, dermatology, psychiatry, cardiac, respiratory, lymph, extremities, GI, GU, musculoskeletal, constitutional, reproductive, HEENT.  She denies any difficulties with urination or hematuria.  She denies any changes in bowel habits.  Vaginal bleeding as above.   PHYSICAL EXAM:  height is _0  (1.626 m) and weight is 167 lb 4 oz (75.9 kg). Her temporal temperature is 96.4 F (35.8 C) (abnormal). Her blood pressure is 120/72 and her pulse is 72. Her respiration is 18 and oxygen saturation is 100%.   General: Alert and oriented, in no acute distress HEENT: Head is normocephalic. Extraocular movements are intact.  Neck: Neck is supple, no palpable cervical or supraclavicular lymphadenopathy. Heart: Regular in rate and rhythm with no murmurs, rubs, or gallops. Chest: Clear to auscultation bilaterally, with no rhonchi, wheezes, or rales. Abdomen: Soft, nontender, nondistended, with no rigidity or guarding. Extremities: No cyanosis or edema. Lymphatics: see Neck Exam Skin: No concerning lesions. Musculoskeletal: symmetric strength and muscle tone throughout. Neurologic: Cranial nerves II through XII are grossly intact. No obvious focalities. Speech is fluent. Coordination is intact. Psychiatric: Judgment and insight are intact. Affect is appropriate.  On pelvic examination the external genitalia were unremarkable.  On speculum examination there is tumor evident just inferior to the urethra that is erythematous.  On bimanual exam, this tumor extends more than half the length of the vagina on the anterior /left lateral surface, although most of the bulk of the mass is within the first 2 cm.  The mass easily bleeds with manipulation.  On speculum exam, the vaginal apex and upper 1-2 cm of the vagina are normal in appearance without any lesions noted.  On bimanual examination the tumor appears to extend from the 11 o'clock  position to the 6 o'clock position.  Fullness is noted along the left pelvic sidewall area.   ECOG = 1  0 - Asymptomatic (Fully active, able to carry on all predisease activities without restriction)  1 - Symptomatic but completely ambulatory (Restricted in physically strenuous activity but ambulatory and able to carry out work of a light or sedentary nature. For example, light housework, office work)  2 - Symptomatic, <50% in bed during the day (Ambulatory and capable of all self care but unable to carry out any work activities. Up and about more than 50% of waking hours)  3 - Symptomatic, >50% in bed, but not bedbound (Capable of only limited self-care, confined to bed or chair 50% or more of waking hours)  4 - Bedbound (Completely disabled. Cannot carry on any self-care. Totally confined to bed or chair)  5 - Death   Eustace Pen MM, Creech RH, Tormey DC, et al. [phone]). "Toxicity and response criteria of the [employer]". [place] Oncol. 5 (6): 649-55  LABORATORY DATA:  Lab Results  Component Value Date   WBC 5.3 07/09/2021   HGB 9.9 (L) 07/09/2021   HCT 31.4 (L) 07/09/2021   MCV 70.4 (L) 07/09/2021   PLT 234 07/09/2021   NEUTROABS 3.4 07/09/2021   Lab Results  Component Value Date   NA 138 07/09/2021   K 3.8 07/09/2021   CL 105 07/09/2021   CO2 24 07/09/2021   GLUCOSE 106 (H) 07/09/2021   CREATININE 0.70 07/09/2021   CALCIUM 9.5 07/09/2021      RADIOGRAPHY: CT ABDOMEN PELVIS W CONTRAST  Result Date: 08/01/2021 CLINICAL DATA:  Restaging uterine/cervical cancer. EXAM: CT ABDOMEN AND PELVIS WITH CONTRAST TECHNIQUE: Multidetector CT imaging of the abdomen and pelvis was performed using the standard protocol following bolus administration of intravenous contrast. RADIATION DOSE REDUCTION: This exam was performed according to the departmental dose-optimization program which includes automated exposure control, adjustment of the mA and/or kV according to  patient size and/or use of iterative reconstruction technique. CONTRAST:  51m OMNIPAQUE IOHEXOL 300 MG/ML  SOLN COMPARISON:  CT scan 03/11/2021 FINDINGS: Lower chest: The lung bases are clear of acute process. No pleural effusion or pulmonary lesions. The heart is normal in size. No pericardial effusion. The distal esophagus and aorta are unremarkable. Hepatobiliary: No hepatic lesions or intrahepatic biliary dilatation. The gallbladder is unremarkable. No common bile duct dilatation. Pancreas: No mass, inflammation or ductal dilatation. Spleen: Normal size. No focal lesions. Adrenals/Urinary Tract: Adrenal glands and kidneys are unremarkable and stable. Stable right renal cyst. No renal lesions or hydronephrosis. The bladder is unremarkable. Stomach/Bowel: The stomach, duodenum, small bowel and colon are unremarkable. The terminal ileum and appendix are normal. Vascular/Lymphatic: The aorta is normal in caliber. No dissection. The branch vessels are patent. The major venous structures are patent. No mesenteric or retroperitoneal mass or adenopathy. Small scattered lymph nodes are noted. No findings suspicious for omental or peritoneal surface disease. Reproductive: Status post hysterectomy. Other: Large ill-defined partially enhancing and possibly centrally necrotic vaginal and paravaginal mass. On the coronal images this area measures approximately 6.1 x 5.8 cm. Recommend correlation with direct visualization. MRI pelvis without and with contrast may be helpful for further evaluation. No pelvic adenopathy. Small scattered inguinal nodes. The largest on the left side measures 10 mm on image 83/2. Musculoskeletal: No significant bony findings. IMPRESSION: 1. Large ill-defined partially enhancing and likely centrally necrotic vaginal and paravaginal mass. Recommend correlation with direct visualization. MRI pelvis without and with contrast may be helpful for further evaluation. 2. No findings suspicious for omental  or peritoneal surface disease. No adenopathy. Electronically Signed   By: PMarijo SanesM.D.   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   Recent exam is very suspicious for recurrence within the pelvis area.  Biopsy of this vaginal mass is pending at this time but exam is very consistent with recurrence.  She would be a good candidate for radiation therapy directed at the pelvis area to help control this tumor and to aid in bleeding issues.  She is scheduled for a CT scan of the abdomen and pelvis tomorrow and final treatment plans are pending results of this study.  Given the findings on exam today and her vaginal bleeding would recommend radiation therapy as a component of her treatment regardless of whether she has metastatic disease.  Today, I talked to the patient about the findings and  work-up thus far.  We discussed the natural history of recurrent endometrial cancer and general treatment, highlighting the role of radiotherapy in the management.  We discussed the available radiation techniques, and focused on the details of logistics and delivery.  We reviewed the anticipated acute and late sequelae associated with radiation in this setting.  The patient was encouraged to ask questions that I answered to the best of my ability.  A patient consent form was discussed and signed.  We retained a copy for our records.  The patient would like to proceed with radiation and will be scheduled for CT simulation.  PLAN: She will return for CT simulation on Monday, January 23 with treatments to begin soon afterward.  Anticipate between 5-1/2 to 6 weeks of external beam radiation therapy.  May also consider brachytherapy as a component of her treatment depending on results of upcoming CT scan.   60 minutes of total time was spent for this patient encounter, including  preparation, face-to-face counseling with the patient and coordination of care, physical exam, and documentation of the encounter.   ------------------------------------------------  Blair Promise, PhD, MD  This document serves as a record of services personally performed by Gery Pray, MD. It was created on his behalf by Roney Mans, a trained medical scribe. The creation of this record is based on the scribe's personal observations and the provider's statements to them. This document has been checked and approved by the attending provider.

## 2021-07-29 NOTE — Progress Notes (Signed)
GYN Location of Tumor / Histology: Uterine  Candice Parks presented with symptoms of: 1 year of postmenopausal bleeding  Biopsies revealed:  A. 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 (0.4 cm) - Right ovary, negative for tumor - Bilateral fallopian tubes, negative for tumor - Multiple leiomyomata (up to 8.5 cm)   B: Lymph node, aortocaval, excision - Metastatic carcinoma involving 1 lymph node (0.4 cm) (1/1)   C: Lymph node, left pelvic, excision -Metastatic carcinoma involving 1 lymph node (0.3 cm) (1/1)  Past/Anticipated interventions by Gyn/Onc surgery, if any:  Procedures: Total abdominal hysterectomy, bilateral salpingo-oophorectomy, left pelvic lymphadenectomy, right para-aortic lymphadenectomy  Surgeon: Everitt Amber, MD  Past/Anticipated interventions by medical oncology, if any: Dr Alvy Bimler Patient is on Treatment Plan: UTERINE CARBOPLATIN AUC 6 / PACLITAXEL Q21D  Weight changes, if any: no  Bowel/Bladder complaints, if any: Yes.  , urinary frequency, nocturia x2  Nausea/Vomiting, if any: no  Pain issues, if any:  2/10 downward pulling/pressure sensation in vagina  SAFETY ISSUES: Prior radiation? no Pacemaker/ICD? no Possible current pregnancy? no, hysterectomy Is the patient on methotrexate? no  Current Complaints / other details:  vaginal bleeding, neuropathy in toes    Vitals:   07/30/21 1220  BP: 120/72  Pulse: 72  Resp: 18  Temp: (!) 96.4 F (35.8 C)  TempSrc: Temporal  SpO2: 100%  Weight: 167 lb 4 oz (75.9 kg)  Height: 5\' 4"  (1.626 m)

## 2021-07-30 ENCOUNTER — Ambulatory Visit
Admission: RE | Admit: 2021-07-30 | Discharge: 2021-07-30 | Disposition: A | Discharge status: Home / Self Care | Payer: Medicare PPO | Source: Ambulatory Visit | Attending: Radiation Oncology | Admitting: Radiation Oncology

## 2021-07-30 ENCOUNTER — Other Ambulatory Visit: Payer: Self-pay

## 2021-07-30 ENCOUNTER — Encounter: Payer: Self-pay | Admitting: Radiation Oncology

## 2021-07-30 VITALS — BP 120/72 | HR 72 | Temp 96.4°F | Resp 18 | Ht 64.0 in | Wt 167.2 lb

## 2021-07-30 DIAGNOSIS — Z791 Long term (current) use of non-steroidal anti-inflammatories (NSAID): Secondary | ICD-10-CM | POA: Insufficient documentation

## 2021-07-30 DIAGNOSIS — Z8041 Family history of malignant neoplasm of ovary: Secondary | ICD-10-CM | POA: Insufficient documentation

## 2021-07-30 DIAGNOSIS — Z7952 Long term (current) use of systemic steroids: Secondary | ICD-10-CM | POA: Insufficient documentation

## 2021-07-30 DIAGNOSIS — C55 Malignant neoplasm of uterus, part unspecified: Secondary | ICD-10-CM

## 2021-07-30 DIAGNOSIS — D649 Anemia, unspecified: Secondary | ICD-10-CM | POA: Insufficient documentation

## 2021-07-30 NOTE — Progress Notes (Signed)
See MD note for nursing evaluation. °

## 2021-07-31 ENCOUNTER — Ambulatory Visit (HOSPITAL_BASED_OUTPATIENT_CLINIC_OR_DEPARTMENT_OTHER)
Admission: RE | Admit: 2021-07-31 | Discharge: 2021-07-31 | Disposition: A | Discharge status: Home / Self Care | Payer: Medicare PPO | Source: Ambulatory Visit | Attending: Hematology and Oncology | Admitting: Hematology and Oncology

## 2021-07-31 DIAGNOSIS — N281 Cyst of kidney, acquired: Secondary | ICD-10-CM | POA: Diagnosis not present

## 2021-07-31 DIAGNOSIS — C55 Malignant neoplasm of uterus, part unspecified: Secondary | ICD-10-CM | POA: Diagnosis not present

## 2021-07-31 DIAGNOSIS — C539 Malignant neoplasm of cervix uteri, unspecified: Secondary | ICD-10-CM | POA: Diagnosis not present

## 2021-07-31 MED ORDER — IOHEXOL 300 MG/ML  SOLN
100.0000 mL | Freq: Once | INTRAMUSCULAR | Status: AC | PRN
  Administered 2021-07-31: 80 mL via INTRAVENOUS

## 2021-08-02 ENCOUNTER — Other Ambulatory Visit: Payer: Self-pay

## 2021-08-02 ENCOUNTER — Ambulatory Visit
Admission: RE | Admit: 2021-08-02 | Discharge: 2021-08-02 | Disposition: A | Discharge status: Home / Self Care | Payer: Medicare PPO | Source: Ambulatory Visit | Attending: Radiation Oncology | Admitting: Radiation Oncology

## 2021-08-02 ENCOUNTER — Telehealth: Payer: Self-pay | Admitting: Oncology

## 2021-08-02 ENCOUNTER — Ambulatory Visit: Payer: Medicare PPO | Admitting: Hematology and Oncology

## 2021-08-02 DIAGNOSIS — C55 Malignant neoplasm of uterus, part unspecified: Secondary | ICD-10-CM | POA: Diagnosis not present

## 2021-08-02 DIAGNOSIS — Z510 Encounter for antineoplastic radiation therapy: Secondary | ICD-10-CM | POA: Insufficient documentation

## 2021-08-02 DIAGNOSIS — N939 Abnormal uterine and vaginal bleeding, unspecified: Secondary | ICD-10-CM | POA: Diagnosis not present

## 2021-08-02 DIAGNOSIS — L298 Other pruritus: Secondary | ICD-10-CM | POA: Diagnosis not present

## 2021-08-02 DIAGNOSIS — C775 Secondary and unspecified malignant neoplasm of intrapelvic lymph nodes: Secondary | ICD-10-CM | POA: Diagnosis not present

## 2021-08-02 DIAGNOSIS — Z90710 Acquired absence of both cervix and uterus: Secondary | ICD-10-CM | POA: Diagnosis not present

## 2021-08-02 DIAGNOSIS — N898 Other specified noninflammatory disorders of vagina: Secondary | ICD-10-CM | POA: Diagnosis not present

## 2021-08-02 DIAGNOSIS — C7982 Secondary malignant neoplasm of genital organs: Secondary | ICD-10-CM | POA: Diagnosis not present

## 2021-08-02 DIAGNOSIS — C541 Malignant neoplasm of endometrium: Secondary | ICD-10-CM | POA: Diagnosis not present

## 2021-08-02 NOTE — Telephone Encounter (Signed)
Called Candice Parks regarding missed appointment with Dr. Alvy Bimler today.  Rescheduled for port/lab at 12:15 on 08/09/21 followed by radiation at 12:40 followed by follow up with Dr. Alvy Bimler at 1:20.  She verbalized understanding and agreement.

## 2021-08-04 ENCOUNTER — Ambulatory Visit: Payer: Medicare PPO | Admitting: Radiation Oncology

## 2021-08-08 DIAGNOSIS — Z90710 Acquired absence of both cervix and uterus: Secondary | ICD-10-CM | POA: Diagnosis not present

## 2021-08-08 DIAGNOSIS — N898 Other specified noninflammatory disorders of vagina: Secondary | ICD-10-CM | POA: Diagnosis not present

## 2021-08-08 DIAGNOSIS — C775 Secondary and unspecified malignant neoplasm of intrapelvic lymph nodes: Secondary | ICD-10-CM | POA: Diagnosis not present

## 2021-08-08 DIAGNOSIS — C541 Malignant neoplasm of endometrium: Secondary | ICD-10-CM | POA: Diagnosis not present

## 2021-08-08 DIAGNOSIS — C55 Malignant neoplasm of uterus, part unspecified: Secondary | ICD-10-CM | POA: Diagnosis not present

## 2021-08-08 DIAGNOSIS — L298 Other pruritus: Secondary | ICD-10-CM | POA: Diagnosis not present

## 2021-08-08 DIAGNOSIS — N939 Abnormal uterine and vaginal bleeding, unspecified: Secondary | ICD-10-CM | POA: Diagnosis not present

## 2021-08-08 DIAGNOSIS — Z510 Encounter for antineoplastic radiation therapy: Secondary | ICD-10-CM | POA: Diagnosis not present

## 2021-08-08 DIAGNOSIS — C7982 Secondary malignant neoplasm of genital organs: Secondary | ICD-10-CM | POA: Diagnosis not present

## 2021-08-09 ENCOUNTER — Ambulatory Visit
Admission: RE | Admit: 2021-08-09 | Discharge: 2021-08-09 | Disposition: A | Discharge status: Home / Self Care | Payer: Medicare PPO | Source: Ambulatory Visit | Attending: Radiation Oncology | Admitting: Radiation Oncology

## 2021-08-09 ENCOUNTER — Inpatient Hospital Stay: Payer: Medicare PPO

## 2021-08-09 ENCOUNTER — Other Ambulatory Visit: Payer: Self-pay

## 2021-08-09 ENCOUNTER — Inpatient Hospital Stay (HOSPITAL_BASED_OUTPATIENT_CLINIC_OR_DEPARTMENT_OTHER): Payer: Medicare PPO | Admitting: Hematology and Oncology

## 2021-08-09 ENCOUNTER — Other Ambulatory Visit: Payer: Medicare PPO

## 2021-08-09 ENCOUNTER — Telehealth: Payer: Self-pay | Admitting: Oncology

## 2021-08-09 DIAGNOSIS — D539 Nutritional anemia, unspecified: Secondary | ICD-10-CM | POA: Diagnosis not present

## 2021-08-09 DIAGNOSIS — C541 Malignant neoplasm of endometrium: Secondary | ICD-10-CM | POA: Diagnosis not present

## 2021-08-09 DIAGNOSIS — C775 Secondary and unspecified malignant neoplasm of intrapelvic lymph nodes: Secondary | ICD-10-CM | POA: Diagnosis not present

## 2021-08-09 DIAGNOSIS — N939 Abnormal uterine and vaginal bleeding, unspecified: Secondary | ICD-10-CM | POA: Diagnosis not present

## 2021-08-09 DIAGNOSIS — Z90710 Acquired absence of both cervix and uterus: Secondary | ICD-10-CM | POA: Diagnosis not present

## 2021-08-09 DIAGNOSIS — Z510 Encounter for antineoplastic radiation therapy: Secondary | ICD-10-CM | POA: Diagnosis not present

## 2021-08-09 DIAGNOSIS — N898 Other specified noninflammatory disorders of vagina: Secondary | ICD-10-CM | POA: Diagnosis not present

## 2021-08-09 DIAGNOSIS — L298 Other pruritus: Secondary | ICD-10-CM | POA: Diagnosis not present

## 2021-08-09 DIAGNOSIS — C7982 Secondary malignant neoplasm of genital organs: Secondary | ICD-10-CM | POA: Diagnosis not present

## 2021-08-09 DIAGNOSIS — C55 Malignant neoplasm of uterus, part unspecified: Secondary | ICD-10-CM

## 2021-08-09 LAB — CMP (CANCER CENTER ONLY)
ALT: 77 U/L — ABNORMAL HIGH (ref 0–44)
AST: 30 U/L (ref 15–41)
Albumin: 3.7 g/dL (ref 3.5–5.0)
Alkaline Phosphatase: 151 U/L — ABNORMAL HIGH (ref 38–126)
Anion gap: 7 (ref 5–15)
BUN: 12 mg/dL (ref 8–23)
CO2: 26 mmol/L (ref 22–32)
Calcium: 9.4 mg/dL (ref 8.9–10.3)
Chloride: 105 mmol/L (ref 98–111)
Creatinine: 0.61 mg/dL (ref 0.44–1.00)
GFR, Estimated: >60 mL/min (ref >60)
Glucose, Bld: 89 mg/dL (ref 70–99)
Potassium: 4 mmol/L (ref 3.5–5.1)
Sodium: 138 mmol/L (ref 135–145)
Total Bilirubin: 0.3 mg/dL (ref 0.3–1.2)
Total Protein: 7.4 g/dL (ref 6.5–8.1)

## 2021-08-09 LAB — CBC WITH DIFFERENTIAL (CANCER CENTER ONLY)
Abs Immature Granulocytes: 0.01 10*3/uL (ref 0.00–0.07)
Basophils Absolute: 0.1 10*3/uL (ref 0.0–0.1)
Basophils Relative: 1 %
Eosinophils Absolute: 0.1 10*3/uL (ref 0.0–0.5)
Eosinophils Relative: 2 %
HCT: 28 % — ABNORMAL LOW (ref 36.0–46.0)
Hemoglobin: 8.7 g/dL — ABNORMAL LOW (ref 12.0–15.0)
Immature Granulocytes: 0 %
Lymphocytes Relative: 26 %
Lymphs Abs: 1.9 10*3/uL (ref 0.7–4.0)
MCH: 22.3 pg — ABNORMAL LOW (ref 26.0–34.0)
MCHC: 31.1 g/dL (ref 30.0–36.0)
MCV: 71.8 fL — ABNORMAL LOW (ref 80.0–100.0)
Monocytes Absolute: 0.5 10*3/uL (ref 0.1–1.0)
Monocytes Relative: 7 %
Neutro Abs: 4.8 10*3/uL (ref 1.7–7.7)
Neutrophils Relative %: 64 %
Platelet Count: 263 10*3/uL (ref 150–400)
RBC: 3.9 MIL/uL (ref 3.87–5.11)
RDW: 20.6 % — ABNORMAL HIGH (ref 11.5–15.5)
WBC Count: 7.4 10*3/uL (ref 4.0–10.5)
nRBC: 0 % (ref 0.0–0.2)

## 2021-08-09 MED ORDER — SODIUM CHLORIDE 0.9% FLUSH
10.0000 mL | Freq: Once | INTRAVENOUS | Status: AC
  Administered 2021-08-09: 10 mL

## 2021-08-09 NOTE — Telephone Encounter (Signed)
Called Candice Parks and advised her of rescheduled lab appointment today at 12:45, apt with Dr. Alvy Bimler at 1:20 and radiation at 2:15.  She verbalized understanding and agreement.

## 2021-08-10 ENCOUNTER — Other Ambulatory Visit: Payer: Medicare Other

## 2021-08-10 ENCOUNTER — Ambulatory Visit
Admission: RE | Admit: 2021-08-10 | Discharge: 2021-08-10 | Disposition: A | Discharge status: Home / Self Care | Payer: Medicare PPO | Source: Ambulatory Visit | Attending: Radiation Oncology | Admitting: Radiation Oncology

## 2021-08-10 ENCOUNTER — Encounter: Payer: Self-pay | Admitting: Hematology and Oncology

## 2021-08-10 ENCOUNTER — Ambulatory Visit (HOSPITAL_COMMUNITY): Payer: Medicare Other

## 2021-08-10 DIAGNOSIS — Z90710 Acquired absence of both cervix and uterus: Secondary | ICD-10-CM | POA: Diagnosis not present

## 2021-08-10 DIAGNOSIS — C7982 Secondary malignant neoplasm of genital organs: Secondary | ICD-10-CM | POA: Diagnosis not present

## 2021-08-10 DIAGNOSIS — L298 Other pruritus: Secondary | ICD-10-CM | POA: Diagnosis not present

## 2021-08-10 DIAGNOSIS — Z510 Encounter for antineoplastic radiation therapy: Secondary | ICD-10-CM | POA: Diagnosis not present

## 2021-08-10 DIAGNOSIS — C55 Malignant neoplasm of uterus, part unspecified: Secondary | ICD-10-CM | POA: Diagnosis not present

## 2021-08-10 DIAGNOSIS — C541 Malignant neoplasm of endometrium: Secondary | ICD-10-CM | POA: Diagnosis not present

## 2021-08-10 DIAGNOSIS — N898 Other specified noninflammatory disorders of vagina: Secondary | ICD-10-CM | POA: Diagnosis not present

## 2021-08-10 DIAGNOSIS — C775 Secondary and unspecified malignant neoplasm of intrapelvic lymph nodes: Secondary | ICD-10-CM | POA: Diagnosis not present

## 2021-08-10 DIAGNOSIS — N939 Abnormal uterine and vaginal bleeding, unspecified: Secondary | ICD-10-CM | POA: Diagnosis not present

## 2021-08-10 LAB — SURGICAL PATHOLOGY

## 2021-08-10 MED ORDER — HEPARIN SOD (PORK) LOCK FLUSH 100 UNIT/ML IV SOLN
500.0000 [IU] | Freq: Once | INTRAVENOUS | Status: AC
  Administered 2021-08-10: 500 [IU] via INTRAVENOUS

## 2021-08-10 MED ORDER — SODIUM CHLORIDE 0.9% FLUSH
10.0000 mL | Freq: Once | INTRAVENOUS | Status: AC
  Administered 2021-08-10: 10 mL via INTRAVENOUS

## 2021-08-10 NOTE — Assessment & Plan Note (Signed)
The cause of her anemia is multifactorial Even though her ferritin level is normal, the rest of her iron studies confirm signs of iron deficiency The most likely cause of her anemia is due to chronic blood loss/malabsorption syndrome. We discussed some of the risks, benefits, and alternatives of intravenous iron infusions. The patient is symptomatic from anemia and the iron level is critically low. She tolerated oral iron supplement poorly and desires to achieved higher levels of iron faster for adequate hematopoesis. Some of the side-effects to be expected including risks of infusion reactions, phlebitis, headaches, nausea and fatigue.  The patient is willing to proceed. Patient education material was dispensed.  Goal is to keep ferritin level greater than 50 and resolution of anemia We will schedule II doses of IV iron sucrose and repeat in the month She is in agreement to proceed

## 2021-08-10 NOTE — Progress Notes (Signed)
[place] OFFICE PROGRESS NOTE  Patient Care Team: Pcp, No as PCP - General [name], RN as Oncology Nurse Navigator (Oncology)  ASSESSMENT & PLAN:  Uterine cancer [facility] I have reviewed her CT imaging Unfortunately, she had signs of disease recurrence in her pelvis She is scheduled to start radiation therapy I plan to repeat imaging study 2 months after completion of radiation therapy to reassess Currently, she is not symptomatic We will continue aggressive supportive care  Deficiency anemia The cause of her anemia is multifactorial Even though her ferritin level is normal, the rest of her iron studies confirm signs of iron deficiency The most likely cause of her anemia is due to chronic blood loss/malabsorption syndrome. We discussed some of the risks, benefits, and alternatives of intravenous iron infusions. The patient is symptomatic from anemia and the iron level is critically low. She tolerated oral iron supplement poorly and desires to achieved higher levels of iron faster for adequate hematopoesis. Some of the side-effects to be expected including risks of infusion reactions, phlebitis, headaches, nausea and fatigue.  The patient is willing to proceed. Patient education material was dispensed.  Goal is to keep ferritin level greater than 50 and resolution of anemia We will schedule II doses of IV iron sucrose and repeat in the month She is in agreement to proceed  No orders of the defined types were placed in this encounter.   All questions were answered. The patient knows to call the clinic with any problems, questions or concerns. The total time spent in the appointment was 30 minutes encounter with patients including review of chart and various tests results, discussions about plan of care and coordination of care plan   [name], MD 08/10/2021 7:57 AM  INTERVAL HISTORY: Please see below for problem oriented charting. she returns for treatment  follow-up on recent repeat imaging study She denies pelvic pain, discharge or bleeding Her appetite is fair Her energy level is fair She is ready to start radiation therapy to treat her disease  REVIEW OF SYSTEMS:   Constitutional: Denies fevers, chills or abnormal weight loss Eyes: Denies blurriness of vision Ears, nose, mouth, throat, and face: Denies mucositis or sore throat Respiratory: Denies cough, dyspnea or wheezes Cardiovascular: Denies palpitation, chest discomfort or lower extremity swelling Gastrointestinal:  Denies nausea, heartburn or change in bowel habits Skin: Denies abnormal skin rashes Lymphatics: Denies new lymphadenopathy or easy bruising Neurological:Denies numbness, tingling or new weaknesses Behavioral/Psych: Mood is stable, no new changes  All other systems were reviewed with the patient and are negative.  I have reviewed the past medical history, past surgical history, social history and family history with the patient and they are unchanged from previous note.  ALLERGIES:  has No Known Allergies.  MEDICATIONS:  Current Outpatient Medications  Medication Sig Dispense Refill   acetaminophen (TYLENOL) 325 MG tablet Take 650 mg by mouth as needed.     dexamethasone (DECADRON) 4 MG tablet Take 2 tabs at the night before and 2 tab the morning of chemotherapy, every 3 weeks, by mouth x 6 cycles (Patient not taking: Reported on 07/28/2021) 36 tablet 6   Ibuprofen 200 MG CAPS Take 200 mg by mouth every 8 (eight) hours as needed.     lidocaine-prilocaine (EMLA) cream Apply to affected area once (Patient not taking: Reported on 07/28/2021) 30 g 3   magnesium hydroxide (MILK OF MAGNESIA) 400 MG/5ML suspension Take 5 mLs by mouth daily as needed for mild constipation.  ondansetron (ZOFRAN) 8 MG tablet Take 1 tablet (8 mg total) by mouth every 8 (eight) hours as needed. (Patient not taking: Reported on 07/28/2021) 30 tablet 1   prochlorperazine (COMPAZINE) 10 MG tablet  Take 1 tablet (10 mg total) by mouth every 6 (six) hours as needed (Nausea or vomiting). (Patient not taking: Reported on 07/28/2021) 30 tablet 1   No current facility-administered medications for this visit.    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   Uterine cancer ([place])  01/05/2021 Imaging   Outside CT imaging  Bilateral hydronephrosis related extrinsic compression on the ureters by the enlarged uterus.   Multiple calcified uterine fibroids although peripherally enhancing diffuse decreased attenuation is noted centrally within the uterus highly suspicious for endometrial neoplasm given the current history of vaginal bleeding. Further evaluation is recommended. MRI may be helpful. Additionally tissue sampling would likely be helpful.   Hypodensity in the pancreas at the junction of the head and body of the pancreas. This may be related to volume averaging although the possibility of a cystic lesion could not be totally excluded. MRI is recommended for further evaluation.    01/05/2021 Initial Diagnosis   The patient reported at least 1 year of postmenopausal bleeding.  She does not have a primary care provider or gynecologist.  She had known fibroids and had noted that she needed a hysterectomy but had not gotten around to seeking care for this.  She presented to the [facility] emergency department on January 05, 2021 with new onset right lower quadrant pain and this prompted a CT scan of the abdomen and pelvis to be performed on 01/05/2021.     01/14/2021 Pathology Results   FINAL MICROSCOPIC DIAGNOSIS:   A. CERVIX, ENDOCERVIX, BIOPSY:  -  Endometrioid carcinoma, FIGO grade 1  -  See comment    01/26/2021 Imaging   MRI  abdomen  1. No suspicious pancreatic mass identified. 2. Moderate to severe bilateral hydroureteronephrosis, similar to recent CT the abdomen and pelvis, presumably related to extrinsic ureteral compression from the patient's enlarged uterus, as demonstrated on recent CT the abdomen and pelvis 01/05/2021.   02/01/2021 Surgery   Surgery in [facility]  Date of Service: February 01, 2021 3:13 PM  Preoperative Diagnoses: Endometrial cancer  Postoperative Diagnoses: Endometrial cancer  Procedures: Total abdominal hysterectomy, bilateral salpingo-oophorectomy, left pelvic lymphadenectomy, right para-aortic lymphadenectomy  Surgeon: Everitt Amber, MD  Findings: Tumor appreciated in the cervical canal. Large, broad, multi fibroid uterus with a calcified fibroid filling the posterior cul de sac, an anterior lower uterine segment fibroid, and bilateral fibroids near the level of the cardinal ligament. No evidence of intraperitoneal disease. Normal appearing bilateral tubes and ovaries. Bilateral hydroureter from uterine compression. Tumor excreted from specimen into the pelvis at time of removal. An approximately 2cm enlarged left pelvic lymph node and a 1cm enlarged right aorta caval lymph node were identified.     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) - Right ovary, negative  for tumor - Bilateral fallopian tubes, negative for tumor - Multiple leiomyomata (up to 8.5 cm)   B: Lymph node, aortocaval, excision - Metastatic carcinoma involving 1 lymph node (0.4 cm) (1/1)   C: Lymph node, left pelvic, excision -Metastatic carcinoma involving 1 lymph node  (0.3 cm) (1/1)   Diagnosis Comment    The majority of this tumor is composed of well differentiated (overall grade 2, but with areas consistent with grade 1) endometrioid adenocarcinoma with squamous and mucinous differentiation (approximately 70%); multifocally there is abrupt transition to an undifferentiated malignancy with a high-grade monotonous noncohesive appearance (best seen in slide A2) consistent with undifferentiated carcinoma.  The overall morphologic and immunophenotypic findings are consistent with a dedifferentiated endometrial carcinoma.   Of note, the carcinoma present in the lymph nodes is well differentiated endometrioid carcinoma and the metastatic deposit in the ovary is undifferentiated carcinoma. Carcinoma is present less than 1 mm from uterine corpus serosa (in the lower uterine segment).  The assessment of the paracervical soft tissue margins is challenging due to the disrupted nature of the specimen in this area, however tumor appears present less than 1 mm from paracervical soft tissue.  No definite parametrial invasion is identified.   Immunohistochemistry performed at [facility] (with appropriate controls) demonstrates the following immunoprofile in cells of interest: Well differentiated endometrioid carcinoma: CK AE1/AE3: Positive ER: Positive (>90%, strong staining) P53: Favor wild-type (see comment below)   Dedifferentiated carcinoma: CK AE1/AE3: Very rare cells, predominantly negative ER: Negative P53: Small focus of mutant type staining in a background of wild-type staining (see comment below)   P53 staining comment: In the well differentiated carcinoma component the staining pattern is predominantly wild-type although focally there is slightly increased levels of staining noted.  In the dedifferentiated tumor component, most of the tissue exhibits wild-type pattern staining, however there is a distinct small area that exhibits increased/overexpressed mutant pattern p53  staining.    Immunohistochemistry testing for mismatch repair (MMR) proteins is performed on block A2:   MLH1:  Loss of nuclear expression MSH2:  Intact nuclear expression MSH6:  Intact nuclear expression PMS2:  Loss of nuclear expression   Loss of nuclear expression of MLH1 and PMS2 can be a result of sporadic microsatellite instability (via hypermethylation of the MLH1 promoter region) or due to Lynch syndrome / [place]. Materials will be submitted to the Molecular Pathology Laboratory [phone]) for [facility] promoter hypermethylation testing. These results will be issued in a separate report.      03/05/2021 Initial Diagnosis   Uterine cancer ([place])   03/05/2021 Cancer Staging   Staging form: Corpus Uteri - Carcinoma and Carcinosarcoma, AJCC 8th Edition - Pathologic stage from 03/05/2021: Stage IIIC2 (pT3, pN2a, cM0) - Signed by [name], MD on 03/05/2021 Stage prefix: Initial diagnosis    03/11/2021 Imaging   1. Status post interval hysterectomy and oophorectomy. 2. There is a rim enhancing soft tissue mass centered in the vagina, measuring approximately 4.4 x 3.1 cm. This is new, or significantly enlarged compared to prior examination dated 01/05/2021. Findings are concerning for malignant involvement. Correlate with physical examination. Contrast enhanced MRI may be helpful to further detail soft tissue anatomy of the low pelvis if indicated. 3. There is at least one newly enlarged left iliac lymph node measuring up to 1.4 x 1.3 cm, nonspecific although concerning for nodal metastatic disease. 4. Fluid attenuation lymphocele or seroma adjacent to surgical clip at the level right ovarian vein. 5. Status post interval midline laparotomy. Subcutaneous fluid within  the inferior aspect of the wound. The presence or absence of infection is not established by CT.     03/23/2021 - 07/09/2021 Chemotherapy   Patient is on Treatment Plan : UTERINE Carboplatin AUC 6 / Paclitaxel q21d     03/23/2021  Procedure   Placement of a subcutaneous power-injectable port device. Catheter tip at the superior cavoatrial junction   07/30/2021 Imaging   1. Large ill-defined partially enhancing and likely centrally necrotic vaginal and paravaginal mass. Recommend correlation with direct visualization. MRI pelvis without and with contrast may be helpful for further evaluation. 2. No findings suspicious for omental or peritoneal surface disease. No adenopathy.     PHYSICAL EXAMINATION: ECOG PERFORMANCE STATUS: 1 - Symptomatic but completely ambulatory  Vitals:   08/09/21 1234  BP: 114/63  Pulse: 87  Resp: 18  Temp: (!) 97.4 F (36.3 C)  SpO2: 100%   Filed Weights   08/09/21 1234  Weight: 166 lb 6.4 oz (75.5 kg)    GENERAL:alert, no distress and comfortable SKIN: skin color, texture, turgor are normal, no rashes or significant lesions EYES: normal, Conjunctiva are pink and non-injected, sclera clear OROPHARYNX:no exudate, no erythema and lips, buccal mucosa, and tongue normal  NECK: supple, thyroid normal size, non-tender, without nodularity LYMPH:  no palpable lymphadenopathy in the cervical, axillary or inguinal LUNGS: clear to auscultation and percussion with normal breathing effort HEART: regular rate & rhythm and no murmurs and no lower extremity edema ABDOMEN:abdomen soft, non-tender and normal bowel sounds Musculoskeletal:no cyanosis of digits and no clubbing  NEURO: alert & oriented x 3 with fluent speech, no focal motor/sensory deficits  LABORATORY DATA:  I have reviewed the data as listed    Component Value Date/Time   NA 138 08/09/2021 1201   K 4.0 08/09/2021 1201   CL 105 08/09/2021 1201   CO2 26 08/09/2021 1201   GLUCOSE 89 08/09/2021 1201   BUN 12 08/09/2021 1201   CREATININE 0.61 08/09/2021 1201   CALCIUM 9.4 08/09/2021 1201   PROT 7.4 08/09/2021 1201   ALBUMIN 3.7 08/09/2021 1201   AST 30 08/09/2021 1201   ALT 77 (H) 08/09/2021 1201   ALKPHOS 151 (H) 08/09/2021  1201   BILITOT 0.3 08/09/2021 1201   GFRNONAA >60 08/09/2021 1201    No results found for: SPEP, UPEP  Lab Results  Component Value Date   WBC 7.4 08/09/2021   NEUTROABS 4.8 08/09/2021   HGB 8.7 (L) 08/09/2021   HCT 28.0 (L) 08/09/2021   MCV 71.8 (L) 08/09/2021   PLT 263 08/09/2021      Chemistry      Component Value Date/Time   NA 138 08/09/2021 1201   K 4.0 08/09/2021 1201   CL 105 08/09/2021 1201   CO2 26 08/09/2021 1201   BUN 12 08/09/2021 1201   CREATININE 0.61 08/09/2021 1201      Component Value Date/Time   CALCIUM 9.4 08/09/2021 1201   ALKPHOS 151 (H) 08/09/2021 1201   AST 30 08/09/2021 1201   ALT 77 (H) 08/09/2021 1201   BILITOT 0.3 08/09/2021 1201       RADIOGRAPHIC STUDIES: I have personally reviewed the radiological images as listed and agreed with the findings in the report. CT ABDOMEN PELVIS W CONTRAST  Result Date: 08/01/2021 CLINICAL DATA:  Restaging uterine/cervical cancer. EXAM: CT ABDOMEN AND PELVIS WITH CONTRAST TECHNIQUE: Multidetector CT imaging of the abdomen and pelvis was performed using the standard protocol following bolus administration of intravenous contrast. RADIATION DOSE REDUCTION: This exam  was performed according to the departmental dose-optimization program which includes automated exposure control, adjustment of the mA and/or kV according to patient size and/or use of iterative reconstruction technique. CONTRAST:  62m OMNIPAQUE IOHEXOL 300 MG/ML  SOLN COMPARISON:  CT scan 03/11/2021 FINDINGS: Lower chest: The lung bases are clear of acute process. No pleural effusion or pulmonary lesions. The heart is normal in size. No pericardial effusion. The distal esophagus and aorta are unremarkable. Hepatobiliary: No hepatic lesions or intrahepatic biliary dilatation. The gallbladder is unremarkable. No common bile duct dilatation. Pancreas: No mass, inflammation or ductal dilatation. Spleen: Normal size. No focal lesions. Adrenals/Urinary Tract:  Adrenal glands and kidneys are unremarkable and stable. Stable right renal cyst. No renal lesions or hydronephrosis. The bladder is unremarkable. Stomach/Bowel: The stomach, duodenum, small bowel and colon are unremarkable. The terminal ileum and appendix are normal. Vascular/Lymphatic: The aorta is normal in caliber. No dissection. The branch vessels are patent. The major venous structures are patent. No mesenteric or retroperitoneal mass or adenopathy. Small scattered lymph nodes are noted. No findings suspicious for omental or peritoneal surface disease. Reproductive: Status post hysterectomy. Other: Large ill-defined partially enhancing and possibly centrally necrotic vaginal and paravaginal mass. On the coronal images this area measures approximately 6.1 x 5.8 cm. Recommend correlation with direct visualization. MRI pelvis without and with contrast may be helpful for further evaluation. No pelvic adenopathy. Small scattered inguinal nodes. The largest on the left side measures 10 mm on image 83/2. Musculoskeletal: No significant bony findings. IMPRESSION: 1. Large ill-defined partially enhancing and likely centrally necrotic vaginal and paravaginal mass. Recommend correlation with direct visualization. MRI pelvis without and with contrast may be helpful for further evaluation. 2. No findings suspicious for omental or peritoneal surface disease. No adenopathy. Electronically Signed   By: PMarijo SanesM.D.   On: 08/01/2021 12:35

## 2021-08-10 NOTE — Assessment & Plan Note (Signed)
I have reviewed her CT imaging Unfortunately, she had signs of disease recurrence in her pelvis She is scheduled to start radiation therapy I plan to repeat imaging study 2 months after completion of radiation therapy to reassess Currently, she is not symptomatic We will continue aggressive supportive care

## 2021-08-11 ENCOUNTER — Ambulatory Visit
Admission: RE | Admit: 2021-08-11 | Discharge: 2021-08-11 | Disposition: A | Discharge status: Home / Self Care | Payer: Medicare PPO | Source: Ambulatory Visit | Attending: Radiation Oncology | Admitting: Radiation Oncology

## 2021-08-11 ENCOUNTER — Other Ambulatory Visit: Payer: Self-pay

## 2021-08-11 DIAGNOSIS — Z9221 Personal history of antineoplastic chemotherapy: Secondary | ICD-10-CM | POA: Insufficient documentation

## 2021-08-11 DIAGNOSIS — D508 Other iron deficiency anemias: Secondary | ICD-10-CM | POA: Diagnosis not present

## 2021-08-11 DIAGNOSIS — Z510 Encounter for antineoplastic radiation therapy: Secondary | ICD-10-CM | POA: Insufficient documentation

## 2021-08-11 DIAGNOSIS — D538 Other specified nutritional anemias: Secondary | ICD-10-CM | POA: Insufficient documentation

## 2021-08-11 DIAGNOSIS — C55 Malignant neoplasm of uterus, part unspecified: Secondary | ICD-10-CM | POA: Insufficient documentation

## 2021-08-12 ENCOUNTER — Ambulatory Visit
Admission: RE | Admit: 2021-08-12 | Discharge: 2021-08-12 | Disposition: A | Discharge status: Home / Self Care | Payer: Medicare PPO | Source: Ambulatory Visit | Attending: Radiation Oncology | Admitting: Radiation Oncology

## 2021-08-12 ENCOUNTER — Telehealth: Payer: Medicare Other | Admitting: Hematology and Oncology

## 2021-08-12 DIAGNOSIS — D508 Other iron deficiency anemias: Secondary | ICD-10-CM | POA: Diagnosis not present

## 2021-08-12 DIAGNOSIS — D538 Other specified nutritional anemias: Secondary | ICD-10-CM | POA: Diagnosis not present

## 2021-08-12 DIAGNOSIS — C55 Malignant neoplasm of uterus, part unspecified: Secondary | ICD-10-CM | POA: Diagnosis not present

## 2021-08-12 DIAGNOSIS — Z510 Encounter for antineoplastic radiation therapy: Secondary | ICD-10-CM | POA: Diagnosis not present

## 2021-08-12 DIAGNOSIS — Z9221 Personal history of antineoplastic chemotherapy: Secondary | ICD-10-CM | POA: Diagnosis not present

## 2021-08-13 ENCOUNTER — Ambulatory Visit
Admission: RE | Admit: 2021-08-13 | Discharge: 2021-08-13 | Disposition: A | Discharge status: Home / Self Care | Payer: Medicare PPO | Source: Ambulatory Visit | Attending: Radiation Oncology | Admitting: Radiation Oncology

## 2021-08-13 ENCOUNTER — Other Ambulatory Visit: Payer: Self-pay

## 2021-08-13 DIAGNOSIS — Z9221 Personal history of antineoplastic chemotherapy: Secondary | ICD-10-CM | POA: Diagnosis not present

## 2021-08-13 DIAGNOSIS — D538 Other specified nutritional anemias: Secondary | ICD-10-CM | POA: Diagnosis not present

## 2021-08-13 DIAGNOSIS — C55 Malignant neoplasm of uterus, part unspecified: Secondary | ICD-10-CM | POA: Diagnosis not present

## 2021-08-13 DIAGNOSIS — D508 Other iron deficiency anemias: Secondary | ICD-10-CM | POA: Diagnosis not present

## 2021-08-13 DIAGNOSIS — Z510 Encounter for antineoplastic radiation therapy: Secondary | ICD-10-CM | POA: Diagnosis not present

## 2021-08-16 ENCOUNTER — Ambulatory Visit
Admission: RE | Admit: 2021-08-16 | Discharge: 2021-08-16 | Disposition: A | Discharge status: Home / Self Care | Payer: Medicare PPO | Source: Ambulatory Visit | Attending: Radiation Oncology | Admitting: Radiation Oncology

## 2021-08-16 ENCOUNTER — Inpatient Hospital Stay: Payer: Medicare PPO

## 2021-08-16 ENCOUNTER — Other Ambulatory Visit: Payer: Self-pay

## 2021-08-16 ENCOUNTER — Telehealth: Payer: Self-pay

## 2021-08-16 DIAGNOSIS — D538 Other specified nutritional anemias: Secondary | ICD-10-CM | POA: Diagnosis not present

## 2021-08-16 DIAGNOSIS — Z9221 Personal history of antineoplastic chemotherapy: Secondary | ICD-10-CM | POA: Diagnosis not present

## 2021-08-16 DIAGNOSIS — D508 Other iron deficiency anemias: Secondary | ICD-10-CM | POA: Diagnosis not present

## 2021-08-16 DIAGNOSIS — C55 Malignant neoplasm of uterus, part unspecified: Secondary | ICD-10-CM | POA: Diagnosis not present

## 2021-08-16 DIAGNOSIS — Z510 Encounter for antineoplastic radiation therapy: Secondary | ICD-10-CM | POA: Diagnosis not present

## 2021-08-16 NOTE — Telephone Encounter (Signed)
Can you try to catch her during her radiation appt?

## 2021-08-16 NOTE — Telephone Encounter (Signed)
Attempted to call her. Voicemail full and unable to leave a message. She came early for radiation. Will try to catch up with her tomorrow.

## 2021-08-16 NOTE — Telephone Encounter (Signed)
Called regarding missed appt today for infusion. No answer and voice mail full.  FYI

## 2021-08-17 ENCOUNTER — Telehealth: Payer: Self-pay

## 2021-08-17 ENCOUNTER — Ambulatory Visit
Admission: RE | Admit: 2021-08-17 | Discharge: 2021-08-17 | Disposition: A | Discharge status: Home / Self Care | Payer: Medicare PPO | Source: Ambulatory Visit | Attending: Radiation Oncology | Admitting: Radiation Oncology

## 2021-08-17 DIAGNOSIS — D508 Other iron deficiency anemias: Secondary | ICD-10-CM | POA: Diagnosis not present

## 2021-08-17 DIAGNOSIS — C55 Malignant neoplasm of uterus, part unspecified: Secondary | ICD-10-CM | POA: Diagnosis not present

## 2021-08-17 DIAGNOSIS — Z510 Encounter for antineoplastic radiation therapy: Secondary | ICD-10-CM | POA: Diagnosis not present

## 2021-08-17 DIAGNOSIS — Z9221 Personal history of antineoplastic chemotherapy: Secondary | ICD-10-CM | POA: Diagnosis not present

## 2021-08-17 DIAGNOSIS — D538 Other specified nutritional anemias: Secondary | ICD-10-CM | POA: Diagnosis not present

## 2021-08-17 NOTE — Telephone Encounter (Signed)
Returned her call. Unable to leave a message her voicemail full.

## 2021-08-18 ENCOUNTER — Ambulatory Visit
Admission: RE | Admit: 2021-08-18 | Discharge: 2021-08-18 | Disposition: A | Discharge status: Home / Self Care | Payer: Medicare PPO | Source: Ambulatory Visit | Attending: Radiation Oncology | Admitting: Radiation Oncology

## 2021-08-18 ENCOUNTER — Other Ambulatory Visit: Payer: Self-pay

## 2021-08-18 ENCOUNTER — Inpatient Hospital Stay: Payer: Medicare PPO

## 2021-08-18 VITALS — BP 119/69 | HR 73 | Temp 98.6°F | Resp 18

## 2021-08-18 DIAGNOSIS — D538 Other specified nutritional anemias: Secondary | ICD-10-CM | POA: Insufficient documentation

## 2021-08-18 DIAGNOSIS — Z510 Encounter for antineoplastic radiation therapy: Secondary | ICD-10-CM | POA: Diagnosis not present

## 2021-08-18 DIAGNOSIS — Z9221 Personal history of antineoplastic chemotherapy: Secondary | ICD-10-CM | POA: Insufficient documentation

## 2021-08-18 DIAGNOSIS — C55 Malignant neoplasm of uterus, part unspecified: Secondary | ICD-10-CM | POA: Diagnosis not present

## 2021-08-18 DIAGNOSIS — D508 Other iron deficiency anemias: Secondary | ICD-10-CM | POA: Diagnosis not present

## 2021-08-18 MED ORDER — SODIUM CHLORIDE 0.9 % IV SOLN: Freq: Once | INTRAVENOUS | Status: AC

## 2021-08-18 MED ORDER — CYANOCOBALAMIN 1000 MCG/ML IJ SOLN
1000.0000 ug | Freq: Once | INTRAMUSCULAR | Status: AC
  Administered 2021-08-18: 1000 ug via INTRAMUSCULAR
  Filled 2021-08-18: qty 1

## 2021-08-18 MED ORDER — SODIUM CHLORIDE 0.9 % IV SOLN
400.0000 mg | Freq: Once | INTRAVENOUS | Status: AC
  Administered 2021-08-18: 400 mg via INTRAVENOUS
  Filled 2021-08-18: qty 20

## 2021-08-18 NOTE — Patient Instructions (Signed)

## 2021-08-19 ENCOUNTER — Ambulatory Visit
Admission: RE | Admit: 2021-08-19 | Discharge: 2021-08-19 | Disposition: A | Discharge status: Home / Self Care | Payer: Medicare PPO | Source: Ambulatory Visit | Attending: Radiation Oncology | Admitting: Radiation Oncology

## 2021-08-19 DIAGNOSIS — C55 Malignant neoplasm of uterus, part unspecified: Secondary | ICD-10-CM | POA: Diagnosis not present

## 2021-08-19 DIAGNOSIS — Z510 Encounter for antineoplastic radiation therapy: Secondary | ICD-10-CM | POA: Diagnosis not present

## 2021-08-19 DIAGNOSIS — Z9221 Personal history of antineoplastic chemotherapy: Secondary | ICD-10-CM | POA: Diagnosis not present

## 2021-08-19 DIAGNOSIS — D538 Other specified nutritional anemias: Secondary | ICD-10-CM | POA: Diagnosis not present

## 2021-08-19 DIAGNOSIS — D508 Other iron deficiency anemias: Secondary | ICD-10-CM | POA: Diagnosis not present

## 2021-08-20 ENCOUNTER — Other Ambulatory Visit: Payer: Self-pay

## 2021-08-20 ENCOUNTER — Ambulatory Visit
Admission: RE | Admit: 2021-08-20 | Discharge: 2021-08-20 | Disposition: A | Discharge status: Home / Self Care | Payer: Medicare PPO | Source: Ambulatory Visit | Attending: Radiation Oncology | Admitting: Radiation Oncology

## 2021-08-20 DIAGNOSIS — C55 Malignant neoplasm of uterus, part unspecified: Secondary | ICD-10-CM | POA: Diagnosis not present

## 2021-08-20 DIAGNOSIS — D538 Other specified nutritional anemias: Secondary | ICD-10-CM | POA: Diagnosis not present

## 2021-08-20 DIAGNOSIS — Z510 Encounter for antineoplastic radiation therapy: Secondary | ICD-10-CM | POA: Diagnosis not present

## 2021-08-20 DIAGNOSIS — D508 Other iron deficiency anemias: Secondary | ICD-10-CM | POA: Diagnosis not present

## 2021-08-20 DIAGNOSIS — Z9221 Personal history of antineoplastic chemotherapy: Secondary | ICD-10-CM | POA: Diagnosis not present

## 2021-08-23 ENCOUNTER — Ambulatory Visit
Admission: RE | Admit: 2021-08-23 | Discharge: 2021-08-23 | Disposition: A | Discharge status: Home / Self Care | Payer: Medicare PPO | Source: Ambulatory Visit | Attending: Radiation Oncology | Admitting: Radiation Oncology

## 2021-08-23 ENCOUNTER — Inpatient Hospital Stay: Payer: Medicare PPO

## 2021-08-23 ENCOUNTER — Other Ambulatory Visit: Payer: Self-pay

## 2021-08-23 VITALS — BP 116/58 | HR 73 | Temp 98.5°F | Resp 18 | Wt 168.2 lb

## 2021-08-23 DIAGNOSIS — C55 Malignant neoplasm of uterus, part unspecified: Secondary | ICD-10-CM

## 2021-08-23 DIAGNOSIS — Z9221 Personal history of antineoplastic chemotherapy: Secondary | ICD-10-CM | POA: Diagnosis not present

## 2021-08-23 DIAGNOSIS — D508 Other iron deficiency anemias: Secondary | ICD-10-CM | POA: Diagnosis not present

## 2021-08-23 DIAGNOSIS — Z510 Encounter for antineoplastic radiation therapy: Secondary | ICD-10-CM | POA: Diagnosis not present

## 2021-08-23 DIAGNOSIS — D538 Other specified nutritional anemias: Secondary | ICD-10-CM | POA: Diagnosis not present

## 2021-08-23 MED ORDER — HEPARIN SOD (PORK) LOCK FLUSH 100 UNIT/ML IV SOLN
500.0000 [IU] | Freq: Once | INTRAVENOUS | Status: AC
  Administered 2021-08-23: 500 [IU]

## 2021-08-23 MED ORDER — SODIUM CHLORIDE 0.9 % IV SOLN: Freq: Once | INTRAVENOUS | Status: AC

## 2021-08-23 MED ORDER — SODIUM CHLORIDE 0.9% FLUSH
10.0000 mL | Freq: Once | INTRAVENOUS | Status: AC
  Administered 2021-08-23: 10 mL

## 2021-08-23 MED ORDER — SODIUM CHLORIDE 0.9 % IV SOLN
400.0000 mg | Freq: Once | INTRAVENOUS | Status: AC
  Administered 2021-08-23: 400 mg via INTRAVENOUS
  Filled 2021-08-23: qty 20

## 2021-08-23 NOTE — Progress Notes (Signed)
Pt declined to stay for 30 min observation post iron infusion. Pt tolerated well, d/c in stable condition, ambulatory to lobby and VSS.

## 2021-08-23 NOTE — Patient Instructions (Signed)

## 2021-08-24 ENCOUNTER — Ambulatory Visit
Admission: RE | Admit: 2021-08-24 | Discharge: 2021-08-24 | Disposition: A | Discharge status: Home / Self Care | Payer: Medicare PPO | Source: Ambulatory Visit | Attending: Radiation Oncology | Admitting: Radiation Oncology

## 2021-08-24 ENCOUNTER — Other Ambulatory Visit: Payer: Self-pay

## 2021-08-24 DIAGNOSIS — C55 Malignant neoplasm of uterus, part unspecified: Secondary | ICD-10-CM | POA: Diagnosis not present

## 2021-08-24 DIAGNOSIS — D538 Other specified nutritional anemias: Secondary | ICD-10-CM | POA: Diagnosis not present

## 2021-08-24 DIAGNOSIS — Z9221 Personal history of antineoplastic chemotherapy: Secondary | ICD-10-CM | POA: Diagnosis not present

## 2021-08-24 DIAGNOSIS — Z510 Encounter for antineoplastic radiation therapy: Secondary | ICD-10-CM | POA: Diagnosis not present

## 2021-08-24 DIAGNOSIS — D508 Other iron deficiency anemias: Secondary | ICD-10-CM | POA: Diagnosis not present

## 2021-08-25 ENCOUNTER — Ambulatory Visit
Admission: RE | Admit: 2021-08-25 | Discharge: 2021-08-25 | Disposition: A | Discharge status: Home / Self Care | Payer: Medicare PPO | Source: Ambulatory Visit | Attending: Radiation Oncology | Admitting: Radiation Oncology

## 2021-08-25 DIAGNOSIS — D538 Other specified nutritional anemias: Secondary | ICD-10-CM | POA: Diagnosis not present

## 2021-08-25 DIAGNOSIS — C55 Malignant neoplasm of uterus, part unspecified: Secondary | ICD-10-CM | POA: Diagnosis not present

## 2021-08-25 DIAGNOSIS — Z510 Encounter for antineoplastic radiation therapy: Secondary | ICD-10-CM | POA: Diagnosis not present

## 2021-08-25 DIAGNOSIS — Z9221 Personal history of antineoplastic chemotherapy: Secondary | ICD-10-CM | POA: Diagnosis not present

## 2021-08-25 DIAGNOSIS — D508 Other iron deficiency anemias: Secondary | ICD-10-CM | POA: Diagnosis not present

## 2021-08-26 ENCOUNTER — Ambulatory Visit
Admission: RE | Admit: 2021-08-26 | Discharge: 2021-08-26 | Disposition: A | Discharge status: Home / Self Care | Payer: Medicare PPO | Source: Ambulatory Visit | Attending: Radiation Oncology | Admitting: Radiation Oncology

## 2021-08-26 DIAGNOSIS — Z9221 Personal history of antineoplastic chemotherapy: Secondary | ICD-10-CM | POA: Diagnosis not present

## 2021-08-26 DIAGNOSIS — Z510 Encounter for antineoplastic radiation therapy: Secondary | ICD-10-CM | POA: Diagnosis not present

## 2021-08-26 DIAGNOSIS — D538 Other specified nutritional anemias: Secondary | ICD-10-CM | POA: Diagnosis not present

## 2021-08-26 DIAGNOSIS — D508 Other iron deficiency anemias: Secondary | ICD-10-CM | POA: Diagnosis not present

## 2021-08-26 DIAGNOSIS — C55 Malignant neoplasm of uterus, part unspecified: Secondary | ICD-10-CM | POA: Diagnosis not present

## 2021-08-27 ENCOUNTER — Ambulatory Visit
Admission: RE | Admit: 2021-08-27 | Discharge: 2021-08-27 | Disposition: A | Discharge status: Home / Self Care | Payer: Medicare PPO | Source: Ambulatory Visit | Attending: Radiation Oncology | Admitting: Radiation Oncology

## 2021-08-27 ENCOUNTER — Other Ambulatory Visit: Payer: Self-pay

## 2021-08-27 DIAGNOSIS — Z510 Encounter for antineoplastic radiation therapy: Secondary | ICD-10-CM | POA: Diagnosis not present

## 2021-08-27 DIAGNOSIS — D508 Other iron deficiency anemias: Secondary | ICD-10-CM | POA: Diagnosis not present

## 2021-08-27 DIAGNOSIS — D538 Other specified nutritional anemias: Secondary | ICD-10-CM | POA: Diagnosis not present

## 2021-08-27 DIAGNOSIS — Z9221 Personal history of antineoplastic chemotherapy: Secondary | ICD-10-CM | POA: Diagnosis not present

## 2021-08-27 DIAGNOSIS — C55 Malignant neoplasm of uterus, part unspecified: Secondary | ICD-10-CM | POA: Diagnosis not present

## 2021-08-30 ENCOUNTER — Ambulatory Visit
Admission: RE | Admit: 2021-08-30 | Discharge: 2021-08-30 | Disposition: A | Discharge status: Home / Self Care | Payer: Medicare PPO | Source: Ambulatory Visit | Attending: Radiation Oncology | Admitting: Radiation Oncology

## 2021-08-30 ENCOUNTER — Other Ambulatory Visit: Payer: Self-pay

## 2021-08-30 DIAGNOSIS — Z9221 Personal history of antineoplastic chemotherapy: Secondary | ICD-10-CM | POA: Diagnosis not present

## 2021-08-30 DIAGNOSIS — Z510 Encounter for antineoplastic radiation therapy: Secondary | ICD-10-CM | POA: Diagnosis not present

## 2021-08-30 DIAGNOSIS — D538 Other specified nutritional anemias: Secondary | ICD-10-CM | POA: Diagnosis not present

## 2021-08-30 DIAGNOSIS — C55 Malignant neoplasm of uterus, part unspecified: Secondary | ICD-10-CM | POA: Diagnosis not present

## 2021-08-30 DIAGNOSIS — D508 Other iron deficiency anemias: Secondary | ICD-10-CM | POA: Diagnosis not present

## 2021-08-31 ENCOUNTER — Ambulatory Visit
Admission: RE | Admit: 2021-08-31 | Discharge: 2021-08-31 | Disposition: A | Discharge status: Home / Self Care | Payer: Medicare PPO | Source: Ambulatory Visit | Attending: Radiation Oncology | Admitting: Radiation Oncology

## 2021-08-31 DIAGNOSIS — Z510 Encounter for antineoplastic radiation therapy: Secondary | ICD-10-CM | POA: Diagnosis not present

## 2021-08-31 DIAGNOSIS — D508 Other iron deficiency anemias: Secondary | ICD-10-CM | POA: Diagnosis not present

## 2021-08-31 DIAGNOSIS — C55 Malignant neoplasm of uterus, part unspecified: Secondary | ICD-10-CM | POA: Diagnosis not present

## 2021-08-31 DIAGNOSIS — D538 Other specified nutritional anemias: Secondary | ICD-10-CM | POA: Diagnosis not present

## 2021-08-31 DIAGNOSIS — Z9221 Personal history of antineoplastic chemotherapy: Secondary | ICD-10-CM | POA: Diagnosis not present

## 2021-09-01 ENCOUNTER — Ambulatory Visit
Admission: RE | Admit: 2021-09-01 | Discharge: 2021-09-01 | Disposition: A | Discharge status: Home / Self Care | Payer: Medicare PPO | Source: Ambulatory Visit | Attending: Radiation Oncology | Admitting: Radiation Oncology

## 2021-09-01 DIAGNOSIS — Z9221 Personal history of antineoplastic chemotherapy: Secondary | ICD-10-CM | POA: Diagnosis not present

## 2021-09-01 DIAGNOSIS — D508 Other iron deficiency anemias: Secondary | ICD-10-CM | POA: Diagnosis not present

## 2021-09-01 DIAGNOSIS — D538 Other specified nutritional anemias: Secondary | ICD-10-CM | POA: Diagnosis not present

## 2021-09-01 DIAGNOSIS — C55 Malignant neoplasm of uterus, part unspecified: Secondary | ICD-10-CM | POA: Diagnosis not present

## 2021-09-01 DIAGNOSIS — Z510 Encounter for antineoplastic radiation therapy: Secondary | ICD-10-CM | POA: Diagnosis not present

## 2021-09-02 ENCOUNTER — Ambulatory Visit
Admission: RE | Admit: 2021-09-02 | Discharge: 2021-09-02 | Disposition: A | Discharge status: Home / Self Care | Payer: Medicare PPO | Source: Ambulatory Visit | Attending: Radiation Oncology | Admitting: Radiation Oncology

## 2021-09-02 DIAGNOSIS — Z510 Encounter for antineoplastic radiation therapy: Secondary | ICD-10-CM | POA: Diagnosis not present

## 2021-09-02 DIAGNOSIS — Z9221 Personal history of antineoplastic chemotherapy: Secondary | ICD-10-CM | POA: Diagnosis not present

## 2021-09-02 DIAGNOSIS — D538 Other specified nutritional anemias: Secondary | ICD-10-CM | POA: Diagnosis not present

## 2021-09-02 DIAGNOSIS — C55 Malignant neoplasm of uterus, part unspecified: Secondary | ICD-10-CM | POA: Diagnosis not present

## 2021-09-02 DIAGNOSIS — D508 Other iron deficiency anemias: Secondary | ICD-10-CM | POA: Diagnosis not present

## 2021-09-03 ENCOUNTER — Other Ambulatory Visit: Payer: Self-pay

## 2021-09-03 ENCOUNTER — Ambulatory Visit
Admission: RE | Admit: 2021-09-03 | Discharge: 2021-09-03 | Disposition: A | Discharge status: Home / Self Care | Payer: Medicare PPO | Source: Ambulatory Visit | Attending: Radiation Oncology | Admitting: Radiation Oncology

## 2021-09-03 DIAGNOSIS — C55 Malignant neoplasm of uterus, part unspecified: Secondary | ICD-10-CM | POA: Diagnosis not present

## 2021-09-03 DIAGNOSIS — Z9221 Personal history of antineoplastic chemotherapy: Secondary | ICD-10-CM | POA: Diagnosis not present

## 2021-09-03 DIAGNOSIS — Z510 Encounter for antineoplastic radiation therapy: Secondary | ICD-10-CM | POA: Diagnosis not present

## 2021-09-03 DIAGNOSIS — D538 Other specified nutritional anemias: Secondary | ICD-10-CM | POA: Diagnosis not present

## 2021-09-03 DIAGNOSIS — D508 Other iron deficiency anemias: Secondary | ICD-10-CM | POA: Diagnosis not present

## 2021-09-06 ENCOUNTER — Inpatient Hospital Stay: Payer: Medicare PPO

## 2021-09-06 ENCOUNTER — Other Ambulatory Visit: Payer: Self-pay

## 2021-09-06 ENCOUNTER — Ambulatory Visit
Admission: RE | Admit: 2021-09-06 | Discharge: 2021-09-06 | Disposition: A | Discharge status: Home / Self Care | Payer: Medicare PPO | Source: Ambulatory Visit | Attending: Radiation Oncology | Admitting: Radiation Oncology

## 2021-09-06 ENCOUNTER — Inpatient Hospital Stay: Payer: Medicare PPO | Admitting: Hematology and Oncology

## 2021-09-06 VITALS — BP 114/75 | HR 67 | Temp 97.7°F | Resp 18 | Ht 64.0 in | Wt 167.8 lb

## 2021-09-06 DIAGNOSIS — C55 Malignant neoplasm of uterus, part unspecified: Secondary | ICD-10-CM | POA: Diagnosis not present

## 2021-09-06 DIAGNOSIS — D539 Nutritional anemia, unspecified: Secondary | ICD-10-CM

## 2021-09-06 DIAGNOSIS — D538 Other specified nutritional anemias: Secondary | ICD-10-CM | POA: Diagnosis not present

## 2021-09-06 DIAGNOSIS — Z9221 Personal history of antineoplastic chemotherapy: Secondary | ICD-10-CM | POA: Diagnosis not present

## 2021-09-06 DIAGNOSIS — Z510 Encounter for antineoplastic radiation therapy: Secondary | ICD-10-CM | POA: Diagnosis not present

## 2021-09-06 DIAGNOSIS — D508 Other iron deficiency anemias: Secondary | ICD-10-CM | POA: Diagnosis not present

## 2021-09-06 LAB — CBC WITH DIFFERENTIAL (CANCER CENTER ONLY)
Abs Immature Granulocytes: 0.01 10*3/uL (ref 0.00–0.07)
Basophils Absolute: 0 10*3/uL (ref 0.0–0.1)
Basophils Relative: 1 %
Eosinophils Absolute: 0.1 10*3/uL (ref 0.0–0.5)
Eosinophils Relative: 3 %
HCT: 30.4 % — ABNORMAL LOW (ref 36.0–46.0)
Hemoglobin: 9.5 g/dL — ABNORMAL LOW (ref 12.0–15.0)
Immature Granulocytes: 0 %
Lymphocytes Relative: 21 %
Lymphs Abs: 0.9 10*3/uL (ref 0.7–4.0)
MCH: 23.3 pg — ABNORMAL LOW (ref 26.0–34.0)
MCHC: 31.3 g/dL (ref 30.0–36.0)
MCV: 74.7 fL — ABNORMAL LOW (ref 80.0–100.0)
Monocytes Absolute: 0.3 10*3/uL (ref 0.1–1.0)
Monocytes Relative: 7 %
Neutro Abs: 3 10*3/uL (ref 1.7–7.7)
Neutrophils Relative %: 68 %
Platelet Count: 249 10*3/uL (ref 150–400)
RBC: 4.07 MIL/uL (ref 3.87–5.11)
RDW: 21.2 % — ABNORMAL HIGH (ref 11.5–15.5)
WBC Count: 4.4 10*3/uL (ref 4.0–10.5)
nRBC: 0 % (ref 0.0–0.2)

## 2021-09-06 LAB — CMP (CANCER CENTER ONLY)
ALT: 34 U/L (ref 0–44)
AST: 24 U/L (ref 15–41)
Albumin: 3.8 g/dL (ref 3.5–5.0)
Alkaline Phosphatase: 135 U/L — ABNORMAL HIGH (ref 38–126)
Anion gap: 5 (ref 5–15)
BUN: 11 mg/dL (ref 8–23)
CO2: 29 mmol/L (ref 22–32)
Calcium: 9.4 mg/dL (ref 8.9–10.3)
Chloride: 105 mmol/L (ref 98–111)
Creatinine: 0.59 mg/dL (ref 0.44–1.00)
GFR, Estimated: >60 mL/min (ref >60)
Glucose, Bld: 89 mg/dL (ref 70–99)
Potassium: 3.8 mmol/L (ref 3.5–5.1)
Sodium: 139 mmol/L (ref 135–145)
Total Bilirubin: 0.3 mg/dL (ref 0.3–1.2)
Total Protein: 7.5 g/dL (ref 6.5–8.1)

## 2021-09-07 ENCOUNTER — Encounter: Payer: Self-pay | Admitting: Hematology and Oncology

## 2021-09-07 ENCOUNTER — Ambulatory Visit
Admission: RE | Admit: 2021-09-07 | Discharge: 2021-09-07 | Disposition: A | Discharge status: Home / Self Care | Payer: Medicare PPO | Source: Ambulatory Visit | Attending: Radiation Oncology | Admitting: Radiation Oncology

## 2021-09-07 DIAGNOSIS — C55 Malignant neoplasm of uterus, part unspecified: Secondary | ICD-10-CM | POA: Diagnosis not present

## 2021-09-07 DIAGNOSIS — Z510 Encounter for antineoplastic radiation therapy: Secondary | ICD-10-CM | POA: Diagnosis not present

## 2021-09-07 DIAGNOSIS — D538 Other specified nutritional anemias: Secondary | ICD-10-CM | POA: Diagnosis not present

## 2021-09-07 DIAGNOSIS — Z9221 Personal history of antineoplastic chemotherapy: Secondary | ICD-10-CM | POA: Diagnosis not present

## 2021-09-07 DIAGNOSIS — D508 Other iron deficiency anemias: Secondary | ICD-10-CM | POA: Diagnosis not present

## 2021-09-07 NOTE — Progress Notes (Signed)
[place] OFFICE PROGRESS NOTE  Patient Care Team: Pcp, No as PCP - General [name], RN as Oncology Nurse Navigator (Oncology)  ASSESSMENT & PLAN:  Uterine cancer [facility]) She has minimal spotting She tolerated radiation therapy very well She will continue the same Her hemoglobin is stable I plan to see her again in 2 months for further follow-up and repeat labs  Deficiency anemia She had recent IV iron with improvement of her blood count It is now stable I plan to recheck iron studies and B12 again in her next visit  Orders Placed This Encounter  Procedures   Iron and Iron Binding Capacity ([facility] only)    Standing Status:   Future    Standing Expiration Date:   09/07/2022   Ferritin    Standing Status:   Future    Standing Expiration Date:   09/07/2022   Vitamin B12    Standing Status:   Future    Standing Expiration Date:   09/07/2022    All questions were answered. The patient knows to call the clinic with any problems, questions or concerns. The total time spent in the appointment was 20 minutes encounter with patients including review of chart and various tests results, discussions about plan of care and coordination of care plan   [name], MD 09/07/2021 8:57 AM  INTERVAL HISTORY: Please see below for problem oriented charting. she returns for treatment follow-up She is receiving radiation therapy which she tolerated quite well She has minimal vaginal spotting She felt great after recent IV iron infusion  REVIEW OF SYSTEMS:   Constitutional: Denies fevers, chills or abnormal weight loss Eyes: Denies blurriness of vision Ears, nose, mouth, throat, and face: Denies mucositis or sore throat Respiratory: Denies cough, dyspnea or wheezes Cardiovascular: Denies palpitation, chest discomfort or lower extremity swelling Gastrointestinal:  Denies nausea, heartburn or change in bowel habits Skin: Denies abnormal skin rashes Lymphatics: Denies new  lymphadenopathy or easy bruising Neurological:Denies numbness, tingling or new weaknesses Behavioral/Psych: Mood is stable, no new changes  All other systems were reviewed with the patient and are negative.  I have reviewed the past medical history, past surgical history, social history and family history with the patient and they are unchanged from previous note.  ALLERGIES:  has No Known Allergies.  MEDICATIONS:  Current Outpatient Medications  Medication Sig Dispense Refill   acetaminophen (TYLENOL) 325 MG tablet Take 650 mg by mouth as needed.     dexamethasone (DECADRON) 4 MG tablet Take 2 tabs at the night before and 2 tab the morning of chemotherapy, every 3 weeks, by mouth x 6 cycles (Patient not taking: Reported on 07/28/2021) 36 tablet 6   Ibuprofen 200 MG CAPS Take 200 mg by mouth every 8 (eight) hours as needed.     lidocaine-prilocaine (EMLA) cream Apply to affected area once (Patient not taking: Reported on 07/28/2021) 30 g 3   magnesium hydroxide (MILK OF MAGNESIA) 400 MG/5ML suspension Take 5 mLs by mouth daily as needed for mild constipation.     ondansetron (ZOFRAN) 8 MG tablet Take 1 tablet (8 mg total) by mouth every 8 (eight) hours as needed. (Patient not taking: Reported on 07/28/2021) 30 tablet 1   prochlorperazine (COMPAZINE) 10 MG tablet Take 1 tablet (10 mg total) by mouth every 6 (six) hours as needed (Nausea or vomiting). (Patient not taking: Reported on 07/28/2021) 30 tablet 1   No current facility-administered medications for this visit.    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   Uterine cancer ([place])  01/05/2021 Imaging   Outside CT imaging  Bilateral hydronephrosis related extrinsic  compression on the ureters by the enlarged uterus.   Multiple calcified uterine fibroids although peripherally enhancing diffuse decreased attenuation is noted centrally within the uterus highly suspicious for endometrial neoplasm given the current history of vaginal bleeding. Further evaluation is recommended. MRI may be helpful. Additionally tissue sampling would likely be helpful.   Hypodensity in the pancreas at the junction of the head and body of the pancreas. This may be related to volume averaging although the possibility of a cystic lesion could not be totally excluded. MRI is recommended for further evaluation.    01/05/2021 Initial Diagnosis   The patient reported at least 1 year of postmenopausal bleeding.  She does not have a primary care provider or gynecologist.  She had known fibroids and had noted that she needed a hysterectomy but had not gotten around to seeking care for this.  She presented to the [facility] emergency department on January 05, 2021 with new onset right lower quadrant pain and this prompted a CT scan of the abdomen and pelvis to be performed on 01/05/2021.     01/14/2021 Pathology Results   FINAL MICROSCOPIC DIAGNOSIS:   A. CERVIX, ENDOCERVIX, BIOPSY:  -  Endometrioid carcinoma, FIGO grade 1  -  See comment    01/26/2021 Imaging   MRI abdomen  1. No suspicious pancreatic mass identified. 2. Moderate to severe bilateral hydroureteronephrosis, similar to recent CT the abdomen and pelvis, presumably related to extrinsic ureteral compression from the patient's enlarged uterus, as demonstrated on recent CT the abdomen and pelvis 01/05/2021.   02/01/2021 Surgery   Surgery in [facility]  Date of Service: February 01, 2021 3:13 PM  Preoperative Diagnoses: Endometrial cancer  Postoperative Diagnoses: Endometrial cancer  Procedures: Total abdominal hysterectomy, bilateral salpingo-oophorectomy, left pelvic lymphadenectomy, right para-aortic lymphadenectomy  Surgeon: Everitt Amber,  MD  Findings: Tumor appreciated in the cervical canal. Large, broad, multi fibroid uterus with a calcified fibroid filling the posterior cul de sac, an anterior lower uterine segment fibroid, and bilateral fibroids near the level of the cardinal ligament. No evidence of intraperitoneal disease. Normal appearing bilateral tubes and ovaries. Bilateral hydroureter from uterine compression. Tumor excreted from specimen into the pelvis at time of removal. An approximately 2cm enlarged left pelvic lymph node and a 1cm enlarged right aorta caval lymph node were identified.     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) - Right ovary, negative for tumor - Bilateral fallopian tubes, negative for tumor - Multiple leiomyomata (up to 8.5 cm)   B: Lymph node, aortocaval, excision - Metastatic carcinoma involving 1 lymph node (0.4 cm) (1/1)   C: Lymph node, left pelvic, excision -Metastatic carcinoma involving 1 lymph node (0.3 cm) (1/1)   Diagnosis Comment    The majority of this tumor is composed of well differentiated (overall grade 2, but with areas consistent with grade  1) endometrioid adenocarcinoma with squamous and mucinous differentiation (approximately 70%); multifocally there is abrupt transition to an undifferentiated malignancy with a high-grade monotonous noncohesive appearance (best seen in slide A2) consistent with undifferentiated carcinoma.  The overall morphologic and immunophenotypic findings are consistent with a dedifferentiated endometrial carcinoma.   Of note, the carcinoma present in the lymph nodes is well differentiated endometrioid  carcinoma and the metastatic deposit in the ovary is undifferentiated carcinoma. Carcinoma is present less than 1 mm from uterine corpus serosa (in the lower uterine segment).  The assessment of the paracervical soft tissue margins is challenging due to the disrupted nature of the specimen in this area, however tumor appears present less than 1 mm from paracervical soft tissue.  No definite parametrial invasion is identified.   Immunohistochemistry performed at [facility] (with appropriate controls) demonstrates the following immunoprofile in cells of interest: Well differentiated endometrioid carcinoma: CK AE1/AE3: Positive ER: Positive (>90%, strong staining) P53: Favor wild-type (see comment below)   Dedifferentiated carcinoma: CK AE1/AE3: Very rare cells, predominantly negative ER: Negative P53: Small focus of mutant type staining in a background of wild-type staining (see comment below)   P53 staining comment: In the well differentiated carcinoma component the staining pattern is predominantly wild-type although focally there is slightly increased levels of staining noted.  In the dedifferentiated tumor component, most of the tissue exhibits wild-type pattern staining, however there is a distinct small area that exhibits increased/overexpressed mutant pattern p53 staining.    Immunohistochemistry testing for mismatch repair (MMR) proteins is performed on block A2:   MLH1:  Loss of nuclear expression MSH2:  Intact nuclear expression MSH6:  Intact nuclear expression PMS2:  Loss of nuclear expression   Loss of nuclear expression of MLH1 and PMS2 can be a result of sporadic microsatellite instability (via hypermethylation of the MLH1 promoter region) or due to Lynch syndrome / [place]. Materials will be submitted to the Molecular Pathology Laboratory [id number]) for [facility] promoter hypermethylation testing. These results will be issued in a separate report.      03/05/2021 Initial Diagnosis    Uterine cancer ([place])   03/05/2021 Cancer Staging   Staging form: Corpus Uteri - Carcinoma and Carcinosarcoma, AJCC 8th Edition - Pathologic stage from 03/05/2021: Stage IIIC2 (pT3, pN2a, cM0) - Signed by [name], MD on 03/05/2021 Stage prefix: Initial diagnosis    03/11/2021 Imaging   1. Status post interval hysterectomy and oophorectomy. 2. There is a rim enhancing soft tissue mass centered in the vagina, measuring approximately 4.4 x 3.1 cm. This is new, or significantly enlarged compared to prior examination dated 01/05/2021. Findings are concerning for malignant involvement. Correlate with physical examination. Contrast enhanced MRI may be helpful to further detail soft tissue anatomy of the low pelvis if indicated. 3. There is at least one newly enlarged left iliac lymph node measuring up to 1.4 x 1.3 cm, nonspecific although concerning for nodal metastatic disease. 4. Fluid attenuation lymphocele or seroma adjacent to surgical clip at the level right ovarian vein. 5. Status post interval midline laparotomy. Subcutaneous fluid within the inferior aspect of the wound. The presence or absence of infection is not established by CT.     03/23/2021 - 07/09/2021 Chemotherapy   Patient is on Treatment Plan : UTERINE Carboplatin AUC 6 / Paclitaxel q21d     03/23/2021 Procedure   Placement of a subcutaneous power-injectable port device. Catheter tip at the superior cavoatrial junction   07/30/2021 Imaging   1. Large ill-defined partially enhancing and likely centrally  necrotic vaginal and paravaginal mass. Recommend correlation with direct visualization. MRI pelvis without and with contrast may be helpful for further evaluation. 2. No findings suspicious for omental or peritoneal surface disease. No adenopathy.     PHYSICAL EXAMINATION: ECOG PERFORMANCE STATUS: 1 - Symptomatic but completely ambulatory  Vitals:   09/06/21 1028  BP: 114/75  Pulse: 67  Resp: 18  Temp: 97.7 F (36.5 C)   SpO2: 100%   Filed Weights   09/06/21 1028  Weight: 167 lb 12.8 oz (76.1 kg)    GENERAL:alert, no distress and comfortable NEURO: alert & oriented x 3 with fluent speech, no focal motor/sensory deficits  LABORATORY DATA:  I have reviewed the data as listed    Component Value Date/Time   NA 139 09/06/2021 1003   K 3.8 09/06/2021 1003   CL 105 09/06/2021 1003   CO2 29 09/06/2021 1003   GLUCOSE 89 09/06/2021 1003   BUN 11 09/06/2021 1003   CREATININE 0.59 09/06/2021 1003   CALCIUM 9.4 09/06/2021 1003   PROT 7.5 09/06/2021 1003   ALBUMIN 3.8 09/06/2021 1003   AST 24 09/06/2021 1003   ALT 34 09/06/2021 1003   ALKPHOS 135 (H) 09/06/2021 1003   BILITOT 0.3 09/06/2021 1003   GFRNONAA >60 09/06/2021 1003    No results found for: SPEP, UPEP  Lab Results  Component Value Date   WBC 4.4 09/06/2021   NEUTROABS 3.0 09/06/2021   HGB 9.5 (L) 09/06/2021   HCT 30.4 (L) 09/06/2021   MCV 74.7 (L) 09/06/2021   PLT 249 09/06/2021      Chemistry      Component Value Date/Time   NA 139 09/06/2021 1003   K 3.8 09/06/2021 1003   CL 105 09/06/2021 1003   CO2 29 09/06/2021 1003   BUN 11 09/06/2021 1003   CREATININE 0.59 09/06/2021 1003      Component Value Date/Time   CALCIUM 9.4 09/06/2021 1003   ALKPHOS 135 (H) 09/06/2021 1003   AST 24 09/06/2021 1003   ALT 34 09/06/2021 1003   BILITOT 0.3 09/06/2021 1003

## 2021-09-07 NOTE — Assessment & Plan Note (Signed)
She has minimal spotting She tolerated radiation therapy very well She will continue the same Her hemoglobin is stable I plan to see her again in 2 months for further follow-up and repeat labs

## 2021-09-07 NOTE — Assessment & Plan Note (Signed)
She had recent IV iron with improvement of her blood count It is now stable I plan to recheck iron studies and B12 again in her next visit

## 2021-09-08 ENCOUNTER — Ambulatory Visit
Admission: RE | Admit: 2021-09-08 | Discharge: 2021-09-08 | Disposition: A | Discharge status: Home / Self Care | Payer: Medicare PPO | Source: Ambulatory Visit | Attending: Radiation Oncology | Admitting: Radiation Oncology

## 2021-09-08 ENCOUNTER — Other Ambulatory Visit: Payer: Self-pay

## 2021-09-08 ENCOUNTER — Encounter: Payer: Self-pay | Admitting: Radiation Oncology

## 2021-09-08 DIAGNOSIS — Z510 Encounter for antineoplastic radiation therapy: Secondary | ICD-10-CM | POA: Diagnosis not present

## 2021-09-08 DIAGNOSIS — Z9221 Personal history of antineoplastic chemotherapy: Secondary | ICD-10-CM | POA: Insufficient documentation

## 2021-09-08 DIAGNOSIS — C55 Malignant neoplasm of uterus, part unspecified: Secondary | ICD-10-CM | POA: Diagnosis not present

## 2021-09-08 DIAGNOSIS — D508 Other iron deficiency anemias: Secondary | ICD-10-CM | POA: Insufficient documentation

## 2021-09-08 DIAGNOSIS — D538 Other specified nutritional anemias: Secondary | ICD-10-CM | POA: Diagnosis not present

## 2021-09-09 ENCOUNTER — Telehealth: Payer: Self-pay

## 2021-09-09 ENCOUNTER — Ambulatory Visit: Payer: Medicare PPO

## 2021-09-09 NOTE — Telephone Encounter (Signed)
Called patient to give Dr. Clabe Seal recommendation for skin irration and burning to pelvic area.Patient denies dysuria or other UTI symptoms. Per MD patient to apply aloe or neosporin plus to skin in pelvic area. Patient voiced understanding. Patient states she is applying a ice pack to pelvic area which has been effective. ?

## 2021-09-10 ENCOUNTER — Ambulatory Visit: Payer: Medicare PPO

## 2021-09-10 DIAGNOSIS — N939 Abnormal uterine and vaginal bleeding, unspecified: Secondary | ICD-10-CM | POA: Diagnosis not present

## 2021-09-10 DIAGNOSIS — Z510 Encounter for antineoplastic radiation therapy: Secondary | ICD-10-CM | POA: Insufficient documentation

## 2021-09-10 DIAGNOSIS — C55 Malignant neoplasm of uterus, part unspecified: Secondary | ICD-10-CM | POA: Insufficient documentation

## 2021-09-13 ENCOUNTER — Ambulatory Visit: Payer: Medicare PPO

## 2021-09-14 ENCOUNTER — Ambulatory Visit: Payer: Medicare PPO

## 2021-09-14 ENCOUNTER — Other Ambulatory Visit: Payer: Self-pay | Admitting: Radiation Oncology

## 2021-09-14 ENCOUNTER — Ambulatory Visit: Admission: RE | Admit: 2021-09-14 | Payer: Medicare PPO | Source: Ambulatory Visit

## 2021-09-14 MED ORDER — FLUCONAZOLE 150 MG PO TABS: 150.0000 mg | ORAL_TABLET | Freq: Every day | ORAL | 0 refills | Status: DC

## 2021-09-15 ENCOUNTER — Ambulatory Visit: Payer: Medicare PPO

## 2021-09-16 ENCOUNTER — Ambulatory Visit: Payer: Medicare PPO

## 2021-09-17 ENCOUNTER — Ambulatory Visit: Payer: Medicare PPO

## 2021-09-18 ENCOUNTER — Ambulatory Visit: Payer: Medicare PPO

## 2021-09-20 ENCOUNTER — Ambulatory Visit: Payer: Medicare PPO

## 2021-09-21 ENCOUNTER — Ambulatory Visit: Payer: Medicare PPO

## 2021-09-22 ENCOUNTER — Ambulatory Visit: Payer: Medicare PPO

## 2021-09-23 ENCOUNTER — Ambulatory Visit: Payer: Medicare PPO

## 2021-09-30 ENCOUNTER — Telehealth: Payer: Self-pay

## 2021-09-30 NOTE — Telephone Encounter (Signed)
Returned patients call to confirm follow up appointment with Dr. Sondra Come on 10/21/21 @ 4:15pm. Unable to leave  voicemail.  ?

## 2021-10-01 ENCOUNTER — Telehealth: Payer: Self-pay | Admitting: *Deleted

## 2021-10-01 NOTE — Telephone Encounter (Signed)
RETURNED PATIENT'S PHONE CALL, SPOKE WITH PATIENT. ?

## 2021-10-04 ENCOUNTER — Inpatient Hospital Stay: Payer: Medicare PPO | Admitting: Hematology and Oncology

## 2021-10-04 ENCOUNTER — Inpatient Hospital Stay: Payer: Medicare PPO | Attending: Gynecologic Oncology

## 2021-10-18 ENCOUNTER — Telehealth: Payer: Self-pay | Admitting: Radiation Oncology

## 2021-10-18 ENCOUNTER — Telehealth: Payer: Self-pay | Admitting: *Deleted

## 2021-10-18 NOTE — Telephone Encounter (Signed)
4/10 @ 8:42 am patient called Left voicemail concerning her appointment with Dr. Sondra Come.  Reach out to patient could not left voicemail.  Waiting fpr patient to call back.   ?

## 2021-10-18 NOTE — Telephone Encounter (Signed)
RETURNED PATIENT'S PHONE CALL, SPOKE WITH PATIENT. ?

## 2021-10-19 ENCOUNTER — Encounter: Payer: Self-pay | Admitting: Radiology

## 2021-10-21 ENCOUNTER — Ambulatory Visit: Payer: Self-pay | Admitting: Radiation Oncology

## 2021-10-22 NOTE — Progress Notes (Incomplete)
?  Radiation Oncology         [phone] ?________________________________ ? ? ?Radiation Oncology         [phone] ?________________________________ ? ?Name: Candice Parks MRN: [id number]  ?Date: 09/08/2021  DOB: [birth year] ? ?End of Treatment Note ? ?Diagnosis:  Recurrent endometrial cancer  ?  ?Stage IIIC2 ( pT3, pN2a, cM0) Uterine Cancer; MMR not preserved, MLH1 hypermethylation present ?  ?S/p total hysterectomy, BSO, and lymphadenectomy.     ? ?Indication for treatment:  Curative       ? ?Radiation treatment dates:   08/09/21 through 09/08/21 ? ?Site/dose:  IMRT Uterus; Vagina : 41.5 Gy delivered in 23 Fx with 1.80 Gy/Fx ?(Planned dosage was for 45.00 GY delivered in 25 Fx with 1.80 Gy/Fx, her final treatment (and boost) was canceled secondary to significant radiation reaction) ? ?Beams/energy: 6X ? ?Narrative: During her final weekly treatment check today, the patient was found to have developed a significant radiation reaction in the treatment area, including swelling of the external genitalia and some skin breakdown appreciated (possibly superimposed yeast infection).  She is able to urinate without difficulty and denies any bleeding from the area but does report some drainage from the area.  She denies any chills or fever.  We have given her a sitz bath to use as she does not have a tub at home.  We will also recommended she obtain some Neosporin plus to try on the external genitalia.  She is also been given Silvadene to try.  I have also given her a prescription for Diflucan in the event that she has superimposed yeast infection.  Given the patient's reaction she wishes to cancel her remaining treatments which seems reasonable at this time.  She did report significant tumor shrinkage on her self-exam prior to developing her radiation reaction. Other symptoms reported by the patient include mild dysuria, intermittent incomplete bladder emptying, nocturia, and occasional mild spotting. ? ?Plan:  The patient has completed radiation treatment. The patient will return to radiation oncology clinic for routine followup in one month. I advised them to call or return sooner if they have any questions or concerns related to their recovery or treatment. ? ?----------------------------------- ? ?Blair Promise, PhD, MD ? ?This document serves as a record of services personally performed by Gery Pray, MD. It was created on his behalf by Roney Mans, a trained medical scribe. The creation of this record is based on the scribe's personal observations and the provider's statements to them. This document has been checked and approved by the attending provider. ? ? ?

## 2021-10-24 NOTE — Progress Notes (Signed)
?Radiation Oncology         [phone] ?________________________________ ? ?Name: Candice Parks MRN: [id number]  ?Date: 10/25/2021  DOB: 11/09/1950 ? ?Follow-Up Visit Note ? ?CC: Pcp, No  Lafonda Mosses, MD ? ?  ICD-10-CM   ?1. Malignant neoplasm of uterus, unspecified site ([place])  C55 CANCELED: Urine culture  ?  CANCELED: Urinalysis, Complete w Microscopic  ?  ? ? ?Diagnosis:  Recurrent endometrial cancer  ?  ?Stage IIIC2 ( pT3, pN2a, cM0) Uterine Cancer; MMR not preserved, MLH1 hypermethylation present ?  ?S/p total hysterectomy, BSO, and lymphadenectomy.     ? ?Interval Since Last Radiation:  1 month and 16 days   ? ?Indication for treatment:  Curative       ?Radiation treatment dates:   08/09/21 through 09/08/21 ?Site/dose:  IMRT Uterus; Vagina : 41.5 Gy delivered in 23 Fx with 1.80 Gy/Fx ?(Planned dosage was for 45.00 GY delivered in 25 Fx with 1.80 Gy/Fx, her final 2 treatments (and boost) was canceled secondary to significant radiation reaction) ?  ?Beams/energy: 6X ? ?Narrative:  The patient returns today for routine follow-up.  During her final weekly treatment check on 09/14/21, the patient was found to have developed a significant radiation reaction in the treatment area, including swelling of the external genitalia and some skin breakdown (possibly a superimposed yeast infection).  She reported urinating without difficulty and denied any bleeding from the area, but did report some drainage from the area.  The patient was given a sitz bath to use since she does not have a tub at home.  We also recommended she obtain some Neosporin plus to try on the external genitalia.  She was given Silvadene to try and a prescription for Diflucan in the event that she has superimposed yeast infection.  Given the patient's reactions she wished to cancel her remaining treatments which seemed reasonable.  She did report significant tumor shrinkage on her self-exam prior to developing her radiation reaction.  Other symptoms reported by the patient included mild dysuria, intermittent incomplete bladder emptying, nocturia, and occasional mild spotting.                     ? ?Since she was seen for her initial consultation on 07/30/21, CT of the abdomen and pelvis performed on 07/31/21 showed a large ill-defined partially enhancing and likely centrally necrotic vaginal and paravaginal mass, measuring approximately 6.1 x 5.8 cm. Otherwise, CT showed no findings suspicious for omental or peritoneal surface disease. No evidence of adenopathy was appreciated. ? ?Per her most recent follow up with Dr. Alvy Bimler on 09/06/21, the patient was noted to be feeling well and tolerating radiation. The patient was also noted to have recent IV iron with improvement of her blood counts. Dr. Alvy Bimler will continue to recheck her iron studies and B12 at her next visit. (To review, the cause her anemia is likely multifactorial; chronic blood loss/malabsorption syndrome).  ? ?Patient had healing of her skin and was feeling fairly well until approximately a week to 10 days ago when she started feeling some pressure sensation in the lower pelvis area he denies any vaginal bleeding.  She does have some vaginal discharge.  She denies any strong odor to her urine or blood in her urine.  She denies any problems with rectal bleeding. ? ?Allergies:  has No Known Allergies. ? ?Meds: ?Current Outpatient Medications  ?Medication Sig Dispense Refill  ? acetaminophen (TYLENOL) 325 MG tablet Take 650 mg by mouth as  needed.    ? lidocaine-prilocaine (EMLA) cream Apply to affected area once 30 g 3  ? magnesium hydroxide (MILK OF MAGNESIA) 400 MG/5ML suspension Take 5 mLs by mouth daily as needed for mild constipation.    ? dexamethasone (DECADRON) 4 MG tablet Take 2 tabs at the night before and 2 tab the morning of chemotherapy, every 3 weeks, by mouth x 6 cycles (Patient not taking: Reported on 07/28/2021) 36 tablet 6  ? fluconazole (DIFLUCAN) 150 MG tablet Take  1 tablet (150 mg total) by mouth daily. Take 2nd tablet 72 hours later (Patient not taking: Reported on 10/25/2021) 2 tablet 0  ? Ibuprofen 200 MG CAPS Take 200 mg by mouth every 8 (eight) hours as needed. (Patient not taking: Reported on 10/25/2021)    ? ondansetron (ZOFRAN) 8 MG tablet Take 1 tablet (8 mg total) by mouth every 8 (eight) hours as needed. (Patient not taking: Reported on 07/28/2021) 30 tablet 1  ? prochlorperazine (COMPAZINE) 10 MG tablet Take 1 tablet (10 mg total) by mouth every 6 (six) hours as needed (Nausea or vomiting). (Patient not taking: Reported on 07/28/2021) 30 tablet 1  ? ?No current facility-administered medications for this encounter.  ? ? ?Physical Findings: ?The patient is in no acute distress. Patient is alert and oriented. ? height is $RemoveB'5\' 4"'LSHELJBS$  (1.626 m) and weight is 152 lb 6 oz (69.1 kg). Her temporal temperature is 97.3 ?F (36.3 ?C) (abnormal). Her blood pressure is 132/69 and her pulse is 91. Her respiration is 18 and oxygen saturation is 100%. .  Lungs are clear to auscultation bilaterally. Heart has regular rate and rhythm. No palpable cervical, supraclavicular, or axillary adenopathy. Abdomen soft, non-tender, normal bowel sounds.  On pelvic examination external genitalia is unremarkable.  Clear fluid drainage noted along the external genitalia with somewhat of an odor.  On digital examination patient has palpable mass along the anterior vaginal vault that extends for approximately 4 to 5 cm.  On speculum examination this area shows what appears to be necrotic tumor. ? ? ?Lab Findings: ?Lab Results  ?Component Value Date  ? WBC 8.7 10/25/2021  ? HGB 10.5 (L) 10/25/2021  ? HCT 33.9 (L) 10/25/2021  ? MCV 71.5 (L) 10/25/2021  ? PLT 389 10/25/2021  ? ? ?Radiographic Findings: ?No results found. ? ?Impression: Recurrent endometrial cancer  ?  ?Stage IIIC2 ( pT3, pN2a, cM0) Uterine Cancer; MMR not preserved, MLH1 hypermethylation present ? ?During the course of the patient's treatment  she noticed shrinkage of her palpable tumor in the distal vagina.  This was also noted on my exam during her treatment.  Unfortunately over the past few weeks she has developed pressure in the lower pelvis and exam shows significant persistent disease. ? ?I discussed potential radiation options with the treatment.  We discussed an external beam boost field directed at the residual mass.  We also discussed brachytherapy as a option for her with the understanding that we would not be able to cover the full extent of disease. ? ?Patient was inquiring about other potential treatment.  We discussed that we could have input from Dr. Alvy Bimler  concerning possible additional types of chemotherapy or possibly immunotherapy. ? ?Plan: Treatment plan pending input from medical oncology.  Some component of the patient's symptoms may be related to bladder infection.  She was sent up to the lab for specimen but unfortunately was unable to deliver a specimen for evaluation. ? ? ?____________________________________ ? ?Blair Promise, PhD, MD ? ?This  document serves as a record of services personally performed by Gery Pray, MD. It was created on his behalf by Roney Mans, a trained medical scribe. The creation of this record is based on the scribe's personal observations and the provider's statements to them. This document has been checked and approved by the attending provider. ? ?

## 2021-10-25 ENCOUNTER — Telehealth: Payer: Self-pay

## 2021-10-25 ENCOUNTER — Encounter: Payer: Self-pay | Admitting: Radiation Oncology

## 2021-10-25 ENCOUNTER — Other Ambulatory Visit: Payer: Self-pay

## 2021-10-25 ENCOUNTER — Ambulatory Visit
Admission: RE | Admit: 2021-10-25 | Discharge: 2021-10-25 | Disposition: A | Discharge status: Home / Self Care | Payer: Medicare PPO | Source: Ambulatory Visit | Attending: Radiation Oncology | Admitting: Radiation Oncology

## 2021-10-25 DIAGNOSIS — R300 Dysuria: Secondary | ICD-10-CM | POA: Diagnosis not present

## 2021-10-25 DIAGNOSIS — N898 Other specified noninflammatory disorders of vagina: Secondary | ICD-10-CM | POA: Insufficient documentation

## 2021-10-25 DIAGNOSIS — G893 Neoplasm related pain (acute) (chronic): Secondary | ICD-10-CM | POA: Insufficient documentation

## 2021-10-25 DIAGNOSIS — R351 Nocturia: Secondary | ICD-10-CM | POA: Insufficient documentation

## 2021-10-25 DIAGNOSIS — C55 Malignant neoplasm of uterus, part unspecified: Secondary | ICD-10-CM | POA: Insufficient documentation

## 2021-10-25 DIAGNOSIS — C541 Malignant neoplasm of endometrium: Secondary | ICD-10-CM | POA: Insufficient documentation

## 2021-10-25 DIAGNOSIS — Z90722 Acquired absence of ovaries, bilateral: Secondary | ICD-10-CM | POA: Diagnosis not present

## 2021-10-25 DIAGNOSIS — Z90710 Acquired absence of both cervix and uterus: Secondary | ICD-10-CM | POA: Insufficient documentation

## 2021-10-25 DIAGNOSIS — Z923 Personal history of irradiation: Secondary | ICD-10-CM | POA: Diagnosis not present

## 2021-10-25 DIAGNOSIS — Z7952 Long term (current) use of systemic steroids: Secondary | ICD-10-CM | POA: Insufficient documentation

## 2021-10-25 DIAGNOSIS — R339 Retention of urine, unspecified: Secondary | ICD-10-CM | POA: Diagnosis not present

## 2021-10-25 DIAGNOSIS — D539 Nutritional anemia, unspecified: Secondary | ICD-10-CM | POA: Diagnosis not present

## 2021-10-25 LAB — VITAMIN B12: Vitamin B-12: 1284 pg/mL — ABNORMAL HIGH (ref 180–914)

## 2021-10-25 LAB — CBC WITH DIFFERENTIAL (CANCER CENTER ONLY)
Abs Immature Granulocytes: 0.03 10*3/uL (ref 0.00–0.07)
Basophils Absolute: 0.1 10*3/uL (ref 0.0–0.1)
Basophils Relative: 1 %
Eosinophils Absolute: 0.1 10*3/uL (ref 0.0–0.5)
Eosinophils Relative: 1 %
HCT: 33.9 % — ABNORMAL LOW (ref 36.0–46.0)
Hemoglobin: 10.5 g/dL — ABNORMAL LOW (ref 12.0–15.0)
Immature Granulocytes: 0 %
Lymphocytes Relative: 13 %
Lymphs Abs: 1.1 10*3/uL (ref 0.7–4.0)
MCH: 22.2 pg — ABNORMAL LOW (ref 26.0–34.0)
MCHC: 31 g/dL (ref 30.0–36.0)
MCV: 71.5 fL — ABNORMAL LOW (ref 80.0–100.0)
Monocytes Absolute: 0.5 10*3/uL (ref 0.1–1.0)
Monocytes Relative: 6 %
Neutro Abs: 7 10*3/uL (ref 1.7–7.7)
Neutrophils Relative %: 79 %
Platelet Count: 389 10*3/uL (ref 150–400)
RBC: 4.74 MIL/uL (ref 3.87–5.11)
RDW: 18.1 % — ABNORMAL HIGH (ref 11.5–15.5)
WBC Count: 8.7 10*3/uL (ref 4.0–10.5)
nRBC: 0 % (ref 0.0–0.2)

## 2021-10-25 LAB — CMP (CANCER CENTER ONLY)
ALT: 5 U/L (ref 0–44)
AST: 6 U/L — ABNORMAL LOW (ref 15–41)
Albumin: 3.8 g/dL (ref 3.5–5.0)
Alkaline Phosphatase: 107 U/L (ref 38–126)
Anion gap: 9 (ref 5–15)
BUN: 9 mg/dL (ref 8–23)
CO2: 32 mmol/L (ref 22–32)
Calcium: 9.8 mg/dL (ref 8.9–10.3)
Chloride: 97 mmol/L — ABNORMAL LOW (ref 98–111)
Creatinine: 0.71 mg/dL (ref 0.44–1.00)
GFR, Estimated: >60 mL/min (ref >60)
Glucose, Bld: 107 mg/dL — ABNORMAL HIGH (ref 70–99)
Potassium: 3.1 mmol/L — ABNORMAL LOW (ref 3.5–5.1)
Sodium: 138 mmol/L (ref 135–145)
Total Bilirubin: 0.4 mg/dL (ref 0.3–1.2)
Total Protein: 8.3 g/dL — ABNORMAL HIGH (ref 6.5–8.1)

## 2021-10-25 LAB — IRON AND IRON BINDING CAPACITY (CC-WL,HP ONLY)
Iron: 22 ug/dL — ABNORMAL LOW (ref 28–170)
Saturation Ratios: 10 % — ABNORMAL LOW (ref 10.4–31.8)
TIBC: 213 ug/dL — ABNORMAL LOW (ref 250–450)
UIBC: 191 ug/dL (ref 148–442)

## 2021-10-25 LAB — FERRITIN: Ferritin: 404 ng/mL — ABNORMAL HIGH (ref 11–307)

## 2021-10-25 NOTE — Progress Notes (Signed)
Candice Parks is here today for follow up post radiation to the pelvic. ? ?They completed their radiation on: 09/08/21 ? ?Does the patient complain of any of the following: ? ?Pain:No, patient reports having some pressure to pelvic area.  ?Abdominal bloating: No ?Diarrhea/Constipation: Reports having loose stools.  ?Nausea/Vomiting: No ?Vaginal Discharge: Yes, clear discharge with odor.  ?Blood in Urine or Stool: No ?Urinary Issues (dysuria/incomplete emptying/ incontinence/ increased frequency/urgency): No ?Does patient report using vaginal dilator 2-3 times a week and/or sexually active 2-3 weeks: Patient not using vaginal dilator or sexually active.  ?Post radiation skin changes: Patient reports major improvement to skin in pelvic area.  ? ? ?Additional comments if applicable: ?  ?Vitals:  ? 10/25/21 0800  ?BP: 132/69  ?Pulse: 91  ?Resp: 18  ?Temp: (!) 97.3 ?F (36.3 ?C)  ?TempSrc: Temporal  ?SpO2: 100%  ?Weight: 152 lb 6 oz (69.1 kg)  ?Height: '5\' 4"'$  (1.626 m)  ?  ?

## 2021-10-25 NOTE — Telephone Encounter (Signed)
Received a call from lab representative stating that patient was unable to urinate for UA C&S. No sample was collected.  ?

## 2021-10-26 ENCOUNTER — Telehealth: Payer: Self-pay | Admitting: Oncology

## 2021-10-26 NOTE — Telephone Encounter (Signed)
Attempted to call Candice Parks multiple times to schedule follow up with Dr. Alvy Bimler next week with no answer and no voice mail available.  Called her emergency contact, Gwyndolyn Saxon and he will give her the message to call the office. ?

## 2021-10-26 NOTE — Telephone Encounter (Signed)
[place] called back and appointment was scheduled with Dr. Alvy Bimler on 11/01/21 at 1:45. She verbalized agreement and will arrive at 1:30. ?

## 2021-11-01 ENCOUNTER — Inpatient Hospital Stay: Payer: Medicare PPO | Attending: Gynecologic Oncology | Admitting: Hematology and Oncology

## 2021-11-01 ENCOUNTER — Encounter: Payer: Self-pay | Admitting: Hematology and Oncology

## 2021-11-01 ENCOUNTER — Other Ambulatory Visit (HOSPITAL_COMMUNITY): Payer: Self-pay

## 2021-11-01 ENCOUNTER — Encounter: Payer: Self-pay | Admitting: Oncology

## 2021-11-01 ENCOUNTER — Other Ambulatory Visit: Payer: Self-pay

## 2021-11-01 VITALS — BP 128/82 | HR 115 | Temp 97.7°F | Resp 18 | Ht 64.0 in | Wt 149.2 lb

## 2021-11-01 DIAGNOSIS — Z923 Personal history of irradiation: Secondary | ICD-10-CM | POA: Insufficient documentation

## 2021-11-01 DIAGNOSIS — G893 Neoplasm related pain (acute) (chronic): Secondary | ICD-10-CM | POA: Diagnosis not present

## 2021-11-01 DIAGNOSIS — D539 Nutritional anemia, unspecified: Secondary | ICD-10-CM | POA: Diagnosis not present

## 2021-11-01 DIAGNOSIS — Z9221 Personal history of antineoplastic chemotherapy: Secondary | ICD-10-CM | POA: Insufficient documentation

## 2021-11-01 DIAGNOSIS — C55 Malignant neoplasm of uterus, part unspecified: Secondary | ICD-10-CM | POA: Diagnosis not present

## 2021-11-01 MED ORDER — MORPHINE SULFATE 15 MG PO TABS
15.0000 mg | ORAL_TABLET | ORAL | 0 refills | Status: DC | PRN
  Filled 2021-11-01: qty 60, 10d supply, fill #0

## 2021-11-01 NOTE — Progress Notes (Signed)
Advised Candice Parks of appointment for CT scan on 11/10/21 at 7:30 (NPO 4 hours before, drink contrast at 5:30 and 6:30) and follow up with Dr. Alvy Bimler on 11/12/21 at 1:00.  She verbalized understanding and agreement of appointments and instructions. ?

## 2021-11-01 NOTE — Progress Notes (Signed)
[facility] ?OFFICE PROGRESS NOTE ? ?Patient Care Team: ?Pcp, No as PCP - General ?[name], RN as Oncology Nurse Navigator (Oncology) ? ?ASSESSMENT & PLAN:  ?Uterine cancer ([place]) ?She has just completed radiation therapy recently and started to have recurrent pelvic pain near her rectum over the past 2 weeks ?She is in obvious distress from pain today ?I am concerned she might have significant disease progression in the pelvis ?I will treat her with narcotic prescription for pain ?I will arrange for urgent CT imaging next week for evaluation and follow-up ? ?Cancer associated pain ?She has severe uncontrolled rectal pain ?I suspect this is due to disease recurrence ?I recommend morphine sulfate ?I warned her about risk of side effects with constipation and sedation ? ?Deficiency anemia ?I have reviewed her recent test results ?Her anemia has improved ?She has adequate iron in her blood from recent iron study ? ?Orders Placed This Encounter  ?Procedures  ? CT CHEST ABDOMEN PELVIS W CONTRAST  ?  Standing Status:   Future  ?  Standing Expiration Date:   11/02/2022  ?  Order Specific Question:   Preferred imaging location?  ?  Answer:   [facility]  ?  Order Specific Question:   Radiology Contrast Protocol - do NOT remove file path  ?  Answer:   [email or web address]  ? ? ?All questions were answered. The patient knows to call the clinic with any problems, questions or concerns. ?The total time spent in the appointment was 40 minutes encounter with patients including review of chart and various tests results, discussions about plan of care and coordination of care plan ?  ?[name], MD ?11/01/2021 12:47 PM ? ?INTERVAL HISTORY: ?Please see below for problem oriented charting. ?she returns for treatment follow-up ?She has completed radiation therapy recently ?We attempted to call the patient recently and have difficulties getting her back for  appointments ?She show up to see radiation oncologist last week with blood work ?When I walk into the exam room, the patient is in obvious pain, unable to sit normally due to severe rectal pain ?She rated her pain as severe, has been present for 2 weeks ?She have difficulties with transportation.  Her pastor actually drove her here for her appointment ?She denies nausea or constipation ? ?REVIEW OF SYSTEMS:   ?Constitutional: Denies fevers, chills or abnormal weight loss ?Eyes: Denies blurriness of vision ?Ears, nose, mouth, throat, and face: Denies mucositis or sore throat ?Respiratory: Denies cough, dyspnea or wheezes ?Cardiovascular: Denies palpitation, chest discomfort or lower extremity swelling ?Gastrointestinal:  Denies nausea, heartburn or change in bowel habits ?Skin: Denies abnormal skin rashes ?Lymphatics: Denies new lymphadenopathy or easy bruising ?Neurological:Denies numbness, tingling or new weaknesses ?Behavioral/Psych: Mood is stable, no new changes  ?All other systems were reviewed with the patient and are negative. ? ?I have reviewed the past medical history, past surgical history, social history and family history with the patient and they are unchanged from previous note. ? ?ALLERGIES:  has No Known Allergies. ? ?MEDICATIONS:  ?Current Outpatient Medications  ?Medication Sig Dispense Refill  ? morphine (MSIR) 15 MG tablet Take 1 tablet (15 mg total) by mouth every 4 (four) hours as needed for severe pain. 60 tablet 0  ? acetaminophen (TYLENOL) 325 MG tablet Take 650 mg by mouth as needed.    ? lidocaine-prilocaine (EMLA) cream Apply to affected area once 30 g 3  ? magnesium hydroxide (MILK OF MAGNESIA) 400 MG/5ML suspension Take 5 mLs  by mouth daily as needed for mild constipation.    ? ondansetron (ZOFRAN) 8 MG tablet Take 1 tablet (8 mg total) by mouth every 8 (eight) hours as needed. (Patient not taking: Reported on 07/28/2021) 30 tablet 1  ? prochlorperazine (COMPAZINE) 10 MG tablet Take 1  tablet (10 mg total) by mouth every 6 (six) hours as needed (Nausea or vomiting). (Patient not taking: Reported on 07/28/2021) 30 tablet 1  ? ?No current facility-administered medications for this visit.  ? ? ?SUMMARY OF ONCOLOGIC HISTORY: ?Oncology History Overview Note  ?Endometrioid cancer, FIGO grade 1 ? ?Positive for promoter hypermethylation of the MLH1 gene ?  ?INTERPRETATION: ?The presence of MLH1 promoter hypermethylation in this tumor tissue decreases the likelihood of Lynch syndrome in this patient (see comment). ?  ?Uterine cancer ([place])  ?01/05/2021 Imaging  ? Outside CT imaging ? ?Bilateral hydronephrosis related extrinsic compression on the ureters by the enlarged uterus.  ? ?Multiple calcified uterine fibroids although peripherally enhancing diffuse decreased attenuation is noted centrally within the uterus highly suspicious for endometrial neoplasm given the current history of vaginal bleeding. Further evaluation is recommended. MRI may be helpful. Additionally tissue sampling would likely be helpful.  ? ?Hypodensity in the pancreas at the junction of the head and body of the pancreas. This may be related to volume averaging although the possibility of a cystic lesion could not be totally excluded. MRI is recommended for further evaluation.  ?  ?01/05/2021 Initial Diagnosis  ? The patient reported at least 1 year of postmenopausal bleeding.  She does not have a primary care provider or gynecologist.  She had known fibroids and had noted that she needed a hysterectomy but had not gotten around to seeking care for this.  She presented to the [facility] emergency department on January 05, 2021 with new onset right lower quadrant pain and this prompted a CT scan of the abdomen and pelvis to be performed on 01/05/2021.   ?  ?01/14/2021 Pathology Results  ? FINAL MICROSCOPIC DIAGNOSIS:  ? ?A. CERVIX, ENDOCERVIX, BIOPSY:  ?-  Endometrioid carcinoma, FIGO grade 1  ?-  See comment  ?  ?01/26/2021 Imaging  ? MRI abdomen ? ?1.  No suspicious pancreatic mass identified. ?2. Moderate to severe bilateral hydroureteronephrosis, similar to recent CT the abdomen and pelvis, presumably related to extrinsic ureteral compression from the patient's enlarged uterus, as ?demonstrated on recent CT the abdomen and pelvis 01/05/2021. ?  ?02/01/2021 Surgery  ? Surgery in [facility] ? ?Date of Service: February 01, 2021 3:13 PM ? ?Preoperative Diagnoses: Endometrial cancer ? ?Postoperative Diagnoses: Endometrial cancer ? ?Procedures: Total abdominal hysterectomy, bilateral salpingo-oophorectomy, left pelvic lymphadenectomy, right para-aortic lymphadenectomy ? ?Surgeon: Everitt Amber, MD ? ?Findings: Tumor appreciated in the cervical canal. Large, broad, multi fibroid uterus with a calcified fibroid filling the posterior cul de sac, an anterior lower uterine segment fibroid, and bilateral fibroids near the level of the cardinal ligament. No evidence of intraperitoneal disease. Normal appearing bilateral tubes and ovaries. Bilateral hydroureter from uterine compression. Tumor excreted from specimen into the pelvis at time of removal. An approximately 2cm enlarged left pelvic lymph node and a 1cm enlarged right aorta caval lymph node were identified.   ?  ?02/01/2021 Pathology Results  ? 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  ?B: Lymph node, aortocaval, excision ?- Metastatic carcinoma involving 1 lymph node (0.4 cm) (1/1) ?  ?C: Lymph node, left pelvic, excision ?-Metastatic carcinoma involving 1 lymph node (0.3 cm) (1/1)   ? 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

## 2021-11-01 NOTE — Assessment & Plan Note (Signed)
She has severe uncontrolled rectal pain ?I suspect this is due to disease recurrence ?I recommend morphine sulfate ?I warned her about risk of side effects with constipation and sedation ?

## 2021-11-01 NOTE — Assessment & Plan Note (Signed)
I have reviewed her recent test results ?Her anemia has improved ?She has adequate iron in her blood from recent iron study ?

## 2021-11-01 NOTE — Assessment & Plan Note (Signed)
She has just completed radiation therapy recently and started to have recurrent pelvic pain near her rectum over the past 2 weeks ?She is in obvious distress from pain today ?I am concerned she might have significant disease progression in the pelvis ?I will treat her with narcotic prescription for pain ?I will arrange for urgent CT imaging next week for evaluation and follow-up ?

## 2021-11-02 ENCOUNTER — Encounter: Payer: Self-pay | Admitting: Hematology and Oncology

## 2021-11-08 ENCOUNTER — Other Ambulatory Visit (HOSPITAL_COMMUNITY): Payer: Medicare PPO

## 2021-11-10 ENCOUNTER — Encounter (HOSPITAL_COMMUNITY): Payer: Self-pay

## 2021-11-10 ENCOUNTER — Ambulatory Visit (HOSPITAL_COMMUNITY)
Admission: RE | Admit: 2021-11-10 | Discharge: 2021-11-10 | Disposition: A | Discharge status: Home / Self Care | Payer: Medicare PPO | Source: Ambulatory Visit | Attending: Hematology and Oncology | Admitting: Hematology and Oncology

## 2021-11-10 DIAGNOSIS — C541 Malignant neoplasm of endometrium: Secondary | ICD-10-CM | POA: Diagnosis not present

## 2021-11-10 DIAGNOSIS — C55 Malignant neoplasm of uterus, part unspecified: Secondary | ICD-10-CM | POA: Diagnosis not present

## 2021-11-10 MED ORDER — IOHEXOL 300 MG/ML  SOLN
100.0000 mL | Freq: Once | INTRAMUSCULAR | Status: AC | PRN
  Administered 2021-11-10: 100 mL via INTRAVENOUS

## 2021-11-10 MED ORDER — SODIUM CHLORIDE (PF) 0.9 % IJ SOLN
INTRAMUSCULAR | Status: AC
  Filled 2021-11-10: qty 50

## 2021-11-11 ENCOUNTER — Telehealth: Payer: Self-pay

## 2021-11-11 NOTE — Telephone Encounter (Signed)
Called her regarding message I received about changing appt to phone call tomorrow. ? ?She will keep appt as scheduled to get her options for treatment tomorrow. ?

## 2021-11-12 ENCOUNTER — Inpatient Hospital Stay: Payer: Medicare PPO | Attending: Gynecologic Oncology | Admitting: Hematology and Oncology

## 2021-11-12 ENCOUNTER — Other Ambulatory Visit: Payer: Self-pay

## 2021-11-12 ENCOUNTER — Encounter: Payer: Self-pay | Admitting: Hematology and Oncology

## 2021-11-12 ENCOUNTER — Other Ambulatory Visit (HOSPITAL_COMMUNITY): Payer: Self-pay

## 2021-11-12 VITALS — BP 107/64 | HR 107 | Resp 18 | Ht 64.0 in | Wt 157.4 lb

## 2021-11-12 DIAGNOSIS — Z90710 Acquired absence of both cervix and uterus: Secondary | ICD-10-CM | POA: Diagnosis not present

## 2021-11-12 DIAGNOSIS — G893 Neoplasm related pain (acute) (chronic): Secondary | ICD-10-CM | POA: Insufficient documentation

## 2021-11-12 DIAGNOSIS — Z9079 Acquired absence of other genital organ(s): Secondary | ICD-10-CM | POA: Insufficient documentation

## 2021-11-12 DIAGNOSIS — K5900 Constipation, unspecified: Secondary | ICD-10-CM | POA: Insufficient documentation

## 2021-11-12 DIAGNOSIS — Z923 Personal history of irradiation: Secondary | ICD-10-CM | POA: Insufficient documentation

## 2021-11-12 DIAGNOSIS — Z90722 Acquired absence of ovaries, bilateral: Secondary | ICD-10-CM | POA: Insufficient documentation

## 2021-11-12 DIAGNOSIS — C55 Malignant neoplasm of uterus, part unspecified: Secondary | ICD-10-CM | POA: Insufficient documentation

## 2021-11-12 DIAGNOSIS — K5909 Other constipation: Secondary | ICD-10-CM | POA: Diagnosis not present

## 2021-11-12 DIAGNOSIS — Z5112 Encounter for antineoplastic immunotherapy: Secondary | ICD-10-CM | POA: Insufficient documentation

## 2021-11-12 MED ORDER — POLYETHYLENE GLYCOL 3350 17 G PO PACK
17.0000 g | PACK | Freq: Every day | ORAL | 1 refills | Status: AC
  Filled 2021-11-12: qty 30, 30d supply, fill #0

## 2021-11-12 MED ORDER — MORPHINE SULFATE 15 MG PO TABS
15.0000 mg | ORAL_TABLET | Freq: Four times a day (QID) | ORAL | 0 refills | Status: DC | PRN
  Filled 2021-11-12: qty 90, 23d supply, fill #0

## 2021-11-12 NOTE — Assessment & Plan Note (Signed)
We discussed the importance of regular laxative therapy 

## 2021-11-12 NOTE — Progress Notes (Signed)
[place] ?OFFICE PROGRESS NOTE ? ?Patient Care Team: ?Pcp, No as PCP - General ?[name], RN as Oncology Nurse Navigator (Oncology) ? ?ASSESSMENT & PLAN:  ?Uterine cancer ([place]) ?Unfortunately, she has refractory disease even to radiation treatment with signs of progression of disease in multiple places ?We discussed next step ?She has abnormal dMMR; based on current guidelines, she would qualify for single agent pembrolizumab ?We discussed risk, benefits, side effects of pembrolizumab and she is in agreement to proceed ?We will get her started next week ?I will see her prior to cycle 2 of treatment ?I recommend 2 to 3 months of treatment before repeating imaging study ? ?Cancer associated pain ?Her pain is better controlled with IR morphine ?We discussed chronic pain management ?We discussed narcotic refill policy ? ?Other constipation ?We discussed the importance of regular laxative therapy ? ?Orders Placed This Encounter  ?Procedures  ? CBC with Differential ([facility] Only)  ?  Standing Status:   Standing  ?  Number of Occurrences:   20  ?  Standing Expiration Date:   11/13/2022  ? CMP ([facility] only)  ?  Standing Status:   Standing  ?  Number of Occurrences:   20  ?  Standing Expiration Date:   11/13/2022  ? T4  ?  Standing Status:   Standing  ?  Number of Occurrences:   20  ?  Standing Expiration Date:   11/13/2022  ? TSH  ?  Standing Status:   Standing  ?  Number of Occurrences:   20  ?  Standing Expiration Date:   11/13/2022  ? ? ?All questions were answered. The patient knows to call the clinic with any problems, questions or concerns. ?The total time spent in the appointment was 40 minutes encounter with patients including review of chart and various tests results, discussions about plan of care and coordination of care plan ?  ?[name], MD ?11/12/2021 1:55 PM ? ?INTERVAL HISTORY: ?Please see below for problem oriented charting. ?she returns for treatment follow-up with her  pastor ?Her chronic pelvic pain is better controlled with morphine sulfate ?She is constipated with bowel movement every other day and is not taking regular laxatives ?Denies recent nausea ?Denies recent vaginal bleeding ? ?REVIEW OF SYSTEMS:   ?Constitutional: Denies fevers, chills or abnormal weight loss ?Eyes: Denies blurriness of vision ?Ears, nose, mouth, throat, and face: Denies mucositis or sore throat ?Respiratory: Denies cough, dyspnea or wheezes ?Cardiovascular: Denies palpitation, chest discomfort or lower extremity swelling ?Skin: Denies abnormal skin rashes ?Lymphatics: Denies new lymphadenopathy or easy bruising ?Neurological:Denies numbness, tingling or new weaknesses ?Behavioral/Psych: Mood is stable, no new changes  ?All other systems were reviewed with the patient and are negative. ? ?I have reviewed the past medical history, past surgical history, social history and family history with the patient and they are unchanged from previous note. ? ?ALLERGIES:  has No Known Allergies. ? ?MEDICATIONS:  ?Current Outpatient Medications  ?Medication Sig Dispense Refill  ? acetaminophen (TYLENOL) 325 MG tablet Take 650 mg by mouth as needed.    ? lidocaine-prilocaine (EMLA) cream Apply to affected area once 30 g 3  ? magnesium hydroxide (MILK OF MAGNESIA) 400 MG/5ML suspension Take 5 mLs by mouth daily as needed for mild constipation.    ? morphine (MSIR) 15 MG tablet Take 1 tablet (15 mg total) by mouth every 6 (six) hours as needed for severe pain. 90 tablet 0  ? ondansetron (ZOFRAN) 8 MG  tablet Take 1 tablet (8 mg total) by mouth every 8 (eight) hours as needed. (Patient not taking: Reported on 07/28/2021) 30 tablet 1  ? polyethylene glycol (MIRALAX / GLYCOLAX) 17 g packet Take 17 g by mouth daily. 30 each 1  ? prochlorperazine (COMPAZINE) 10 MG tablet Take 1 tablet (10 mg total) by mouth every 6 (six) hours as needed (Nausea or vomiting). (Patient not taking: Reported on 07/28/2021) 30 tablet 1  ? ?No  current facility-administered medications for this visit.  ? ? ?SUMMARY OF ONCOLOGIC HISTORY: ?Oncology History Overview Note  ?Endometrioid cancer, FIGO grade 1 ? ?Positive for promoter hypermethylation of the MLH1 gene ?  ?INTERPRETATION: ?The presence of MLH1 promoter hypermethylation in this tumor tissue decreases the likelihood of Lynch syndrome in this patient (see comment). ?  ?Uterine cancer ([place])  ?01/05/2021 Imaging  ? Outside CT imaging ? ?Bilateral hydronephrosis related extrinsic compression on the ureters by the enlarged uterus.  ? ?Multiple calcified uterine fibroids although peripherally enhancing diffuse decreased attenuation is noted centrally within the uterus highly suspicious for endometrial neoplasm given the current history of vaginal bleeding. Further evaluation is recommended. MRI may be helpful. Additionally tissue sampling would likely be helpful.  ? ?Hypodensity in the pancreas at the junction of the head and body of the pancreas. This may be related to volume averaging although the possibility of a cystic lesion could not be totally excluded. MRI is recommended for further evaluation.  ?  ?01/05/2021 Initial Diagnosis  ? The patient reported at least 1 year of postmenopausal bleeding.  She does not have a primary care provider or gynecologist.  She had known fibroids and had noted that she needed a hysterectomy but had not gotten around to seeking care for this.  She presented to the [facility] emergency department on January 05, 2021 with new onset right lower quadrant pain and this prompted a CT scan of the abdomen and pelvis to be performed on 01/05/2021.   ?  ?01/14/2021 Pathology Results  ? FINAL MICROSCOPIC DIAGNOSIS:  ? ?A. CERVIX, ENDOCERVIX, BIOPSY:  ?-  Endometrioid carcinoma, FIGO grade 1  ?-  See comment  ?  ?01/26/2021 Imaging  ? MRI abdomen ? ?1. No suspicious pancreatic mass identified. ?2. Moderate to severe bilateral hydroureteronephrosis, similar to recent CT the abdomen and pelvis,  presumably related to extrinsic ureteral compression from the patient's enlarged uterus, as ?demonstrated on recent CT the abdomen and pelvis 01/05/2021. ?  ?02/01/2021 Surgery  ? Surgery in [facility] ? ?Date of Service: February 01, 2021 3:13 PM ? ?Preoperative Diagnoses: Endometrial cancer ? ?Postoperative Diagnoses: Endometrial cancer ? ?Procedures: Total abdominal hysterectomy, bilateral salpingo-oophorectomy, left pelvic lymphadenectomy, right para-aortic lymphadenectomy ? ?Surgeon: Everitt Amber, MD ? ?Findings: Tumor appreciated in the cervical canal. Large, broad, multi fibroid uterus with a calcified fibroid filling the posterior cul de sac, an anterior lower uterine segment fibroid, and bilateral fibroids near the level of the cardinal ligament. No evidence of intraperitoneal disease. Normal appearing bilateral tubes and ovaries. Bilateral hydroureter from uterine compression. Tumor excreted from specimen into the pelvis at time of removal. An approximately 2cm enlarged left pelvic lymph node and a 1cm enlarged right aorta caval lymph node were identified.   ?  ?02/01/2021 Pathology Results  ? 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  ?B: Lymph node, aortocaval, excision ?- Metastatic carcinoma involving 1 lymph node (0.4 cm) (1/1) ?  ?C: Lymph node, left pelvic, excision ?-Metastatic carcinoma involving 1 lymph node (0.3 cm) (1/1)  ? ?Diagnosis Comment    ?The majority of this tumor is composed of well differentiated (overall grade 2, but with areas consistent with grade 1)  endometrioid adenocarcinoma with squamous and mucinous differentiation (approximately 70%); multifocally there is abrupt transition to an undifferentiated malignancy with a high-grade monotonous noncohesive appeara

## 2021-11-12 NOTE — Progress Notes (Signed)
DISCONTINUE ON PATHWAY REGIMEN - Uterine ? ? ?  A cycle is every 21 days: ?    Paclitaxel  ?    Carboplatin  ? ?**Always confirm dose/schedule in your pharmacy ordering system** ? ?REASON: Disease Progression ?PRIOR TREATMENT: UTOS50: Carboplatin AUC=6 + Paclitaxel 175 mg/m2 q21 Days x 6 Cycles ?TREATMENT RESPONSE: Progressive Disease (PD) ? ?START ON PATHWAY REGIMEN - Uterine ? ? ?  A cycle is every 21 days: ?    Pembrolizumab  ? ?**Always confirm dose/schedule in your pharmacy ordering system** ? ?Patient Characteristics: ?Endometrioid, Recurrent/Progressive Disease, Second Line, Systemic Recurrence, Relapse < 12 Months From Prior Therapy, MSI-H/dMMR or TMB-H ?Histology: Endometrioid ?Therapeutic Status: Recurrent or Progressive Disease ?Line of Therapy: Second Line ?Time to Recurrence: Relapse < 12 Months From Prior Therapy ?Microsatellite/Mismatch Repair Status: MSI-H/dMMR ?Tumor Mutational Burden (TMB): Unknown ?Intent of Therapy: ?Non-Curative / Palliative Intent, Discussed with Patient ?

## 2021-11-12 NOTE — Progress Notes (Signed)
Pharmacist Chemotherapy Monitoring - Initial Assessment   ? ?Anticipated start date: 11/19/21  ? ?The following has been reviewed per standard work regarding the patient's treatment regimen: ?The patient's diagnosis, treatment plan and drug doses, and organ/hematologic function ?Lab orders and baseline tests specific to treatment regimen  ?The treatment plan start date, drug sequencing, and pre-medications ?Prior authorization status  ?Patient's documented medication list, including drug-drug interaction screen and prescriptions for anti-emetics and supportive care specific to the treatment regimen ?The drug concentrations, fluid compatibility, administration routes, and timing of the medications to be used ?The patient's access for treatment and lifetime cumulative dose history, if applicable  ?The patient's medication allergies and previous infusion related reactions, if applicable  ? ?Changes made to treatment plan:  ?N/A ? ?Follow up needed:  ?N/A ? ? ?Larene Beach, RPH, ?11/12/2021  3:15 PM ? ?

## 2021-11-12 NOTE — Assessment & Plan Note (Signed)
Her pain is better controlled with IR morphine ?We discussed chronic pain management ?We discussed narcotic refill policy ?

## 2021-11-12 NOTE — Assessment & Plan Note (Signed)
Unfortunately, she has refractory disease even to radiation treatment with signs of progression of disease in multiple places ?We discussed next step ?She has abnormal dMMR; based on current guidelines, she would qualify for single agent pembrolizumab ?We discussed risk, benefits, side effects of pembrolizumab and she is in agreement to proceed ?We will get her started next week ?I will see her prior to cycle 2 of treatment ?I recommend 2 to 3 months of treatment before repeating imaging study ?

## 2021-11-19 ENCOUNTER — Inpatient Hospital Stay: Payer: Medicare PPO

## 2021-11-19 ENCOUNTER — Other Ambulatory Visit: Payer: Self-pay

## 2021-11-19 VITALS — BP 104/89 | HR 87 | Temp 98.4°F | Resp 18 | Wt 144.2 lb

## 2021-11-19 DIAGNOSIS — G893 Neoplasm related pain (acute) (chronic): Secondary | ICD-10-CM | POA: Diagnosis not present

## 2021-11-19 DIAGNOSIS — K5900 Constipation, unspecified: Secondary | ICD-10-CM | POA: Diagnosis not present

## 2021-11-19 DIAGNOSIS — C55 Malignant neoplasm of uterus, part unspecified: Secondary | ICD-10-CM | POA: Diagnosis not present

## 2021-11-19 DIAGNOSIS — Z90722 Acquired absence of ovaries, bilateral: Secondary | ICD-10-CM | POA: Diagnosis not present

## 2021-11-19 DIAGNOSIS — Z90710 Acquired absence of both cervix and uterus: Secondary | ICD-10-CM | POA: Diagnosis not present

## 2021-11-19 DIAGNOSIS — Z923 Personal history of irradiation: Secondary | ICD-10-CM | POA: Diagnosis not present

## 2021-11-19 DIAGNOSIS — Z5112 Encounter for antineoplastic immunotherapy: Secondary | ICD-10-CM | POA: Diagnosis not present

## 2021-11-19 DIAGNOSIS — Z9079 Acquired absence of other genital organ(s): Secondary | ICD-10-CM | POA: Diagnosis not present

## 2021-11-19 LAB — CBC WITH DIFFERENTIAL (CANCER CENTER ONLY)
Abs Immature Granulocytes: 0.07 10*3/uL (ref 0.00–0.07)
Basophils Absolute: 0 10*3/uL (ref 0.0–0.1)
Basophils Relative: 0 %
Eosinophils Absolute: 0 10*3/uL (ref 0.0–0.5)
Eosinophils Relative: 0 %
HCT: 24.7 % — ABNORMAL LOW (ref 36.0–46.0)
Hemoglobin: 8 g/dL — ABNORMAL LOW (ref 12.0–15.0)
Immature Granulocytes: 1 %
Lymphocytes Relative: 6 %
Lymphs Abs: 0.9 10*3/uL (ref 0.7–4.0)
MCH: 22.2 pg — ABNORMAL LOW (ref 26.0–34.0)
MCHC: 32.4 g/dL (ref 30.0–36.0)
MCV: 68.4 fL — ABNORMAL LOW (ref 80.0–100.0)
Monocytes Absolute: 0.6 10*3/uL (ref 0.1–1.0)
Monocytes Relative: 4 %
Neutro Abs: 13.2 10*3/uL — ABNORMAL HIGH (ref 1.7–7.7)
Neutrophils Relative %: 89 %
Platelet Count: 529 10*3/uL — ABNORMAL HIGH (ref 150–400)
RBC: 3.61 MIL/uL — ABNORMAL LOW (ref 3.87–5.11)
RDW: 17.6 % — ABNORMAL HIGH (ref 11.5–15.5)
WBC Count: 14.9 10*3/uL — ABNORMAL HIGH (ref 4.0–10.5)
nRBC: 0 % (ref 0.0–0.2)

## 2021-11-19 LAB — CMP (CANCER CENTER ONLY)
ALT: 7 U/L (ref 0–44)
AST: 6 U/L — ABNORMAL LOW (ref 15–41)
Albumin: 3.2 g/dL — ABNORMAL LOW (ref 3.5–5.0)
Alkaline Phosphatase: 92 U/L (ref 38–126)
Anion gap: 10 (ref 5–15)
BUN: 8 mg/dL (ref 8–23)
CO2: 27 mmol/L (ref 22–32)
Calcium: 8.8 mg/dL — ABNORMAL LOW (ref 8.9–10.3)
Chloride: 100 mmol/L (ref 98–111)
Creatinine: 0.57 mg/dL (ref 0.44–1.00)
GFR, Estimated: >60 mL/min (ref >60)
Glucose, Bld: 112 mg/dL — ABNORMAL HIGH (ref 70–99)
Potassium: 3.3 mmol/L — ABNORMAL LOW (ref 3.5–5.1)
Sodium: 137 mmol/L (ref 135–145)
Total Bilirubin: 0.5 mg/dL (ref 0.3–1.2)
Total Protein: 7.1 g/dL (ref 6.5–8.1)

## 2021-11-19 LAB — TSH: TSH: 0.747 u[IU]/mL (ref 0.350–4.500)

## 2021-11-19 MED ORDER — CYANOCOBALAMIN 1000 MCG/ML IJ SOLN
1000.0000 ug | Freq: Once | INTRAMUSCULAR | Status: AC
  Administered 2021-11-19: 1000 ug via INTRAMUSCULAR
  Filled 2021-11-19: qty 1

## 2021-11-19 MED ORDER — SODIUM CHLORIDE 0.9% FLUSH
10.0000 mL | INTRAVENOUS | Status: DC | PRN
  Administered 2021-11-19: 10 mL

## 2021-11-19 MED ORDER — SODIUM CHLORIDE 0.9 % IV SOLN: Freq: Once | INTRAVENOUS | Status: AC

## 2021-11-19 MED ORDER — HEPARIN SOD (PORK) LOCK FLUSH 100 UNIT/ML IV SOLN
500.0000 [IU] | Freq: Once | INTRAVENOUS | Status: AC | PRN
  Administered 2021-11-19: 500 [IU]

## 2021-11-19 MED ORDER — SODIUM CHLORIDE 0.9 % IV SOLN
200.0000 mg | Freq: Once | INTRAVENOUS | Status: AC
  Administered 2021-11-19: 200 mg via INTRAVENOUS
  Filled 2021-11-19: qty 200

## 2021-11-19 MED ORDER — SODIUM CHLORIDE 0.9% FLUSH
10.0000 mL | Freq: Once | INTRAVENOUS | Status: AC
  Administered 2021-11-19: 10 mL

## 2021-11-19 MED ORDER — ACETAMINOPHEN 325 MG PO TABS
650.0000 mg | ORAL_TABLET | Freq: Once | ORAL | Status: AC
  Administered 2021-11-19: 650 mg via ORAL
  Filled 2021-11-19: qty 2

## 2021-11-19 NOTE — Patient Instructions (Signed)
[facility]  Discharge Instructions: ?Thank you for choosing [facility] to provide your oncology and hematology care.  ? ?If you have a lab appointment with the [facility], please go directly to the [facility] and check in at the registration area. ?  ?Wear comfortable clothing and clothing appropriate for easy access to any Portacath or PICC line.  ? ?We strive to give you quality time with your provider. You may need to reschedule your appointment if you arrive late (15 or more minutes).  Arriving late affects you and other patients whose appointments are after yours.  Also, if you miss three or more appointments without notifying the office, you may be dismissed from the clinic at the provider?s discretion.    ?  ?For prescription refill requests, have your pharmacy contact our office and allow 72 hours for refills to be completed.   ? ?Today you received the following chemotherapy and/or immunotherapy agents: Pembrolizumab Beryle Flock)    ?  ?To help prevent nausea and vomiting after your treatment, we encourage you to take your nausea medication as directed. ? ?BELOW ARE SYMPTOMS THAT SHOULD BE REPORTED IMMEDIATELY: ?*FEVER GREATER THAN 100.4 F (38 ?C) OR HIGHER ?*CHILLS OR SWEATING ?*NAUSEA AND VOMITING THAT IS NOT CONTROLLED WITH YOUR NAUSEA MEDICATION ?*UNUSUAL SHORTNESS OF BREATH ?*UNUSUAL BRUISING OR BLEEDING ?*URINARY PROBLEMS (pain or burning when urinating, or frequent urination) ?*BOWEL PROBLEMS (unusual diarrhea, constipation, pain near the anus) ?TENDERNESS IN MOUTH AND THROAT WITH OR WITHOUT PRESENCE OF ULCERS (sore throat, sores in mouth, or a toothache) ?UNUSUAL RASH, SWELLING OR PAIN  ?UNUSUAL VAGINAL DISCHARGE OR ITCHING  ? ?Items with * indicate a potential emergency and should be followed up as soon as possible or go to the Emergency Department if any problems should occur. ? ?Please show the CHEMOTHERAPY ALERT CARD or IMMUNOTHERAPY ALERT CARD  at check-in to the Emergency Department and triage nurse. ? ?Should you have questions after your visit or need to cancel or reschedule your appointment, please contact [facility]  Dept: [phone]  and follow the prompts.  Office hours are 8:00 a.m. to 4:30 p.m. Monday - Friday. Please note that voicemails left after 4:00 p.m. may not be returned until the following business day.  We are closed weekends and major holidays. You have access to a nurse at all times for urgent questions. Please call the main number to the clinic Dept: [phone] and follow the prompts. ? ? ?For any non-urgent questions, you may also contact your provider using MyChart. We now offer e-Visits for anyone 76 and older to request care online for non-urgent symptoms. For details visit [email or web address]. ?  ?Also download the MyChart app! Go to the app store, search "MyChart", open the app, select [facility], and log in with your MyChart username and password. ? ?Due to Covid, a mask is required upon entering the hospital/clinic. If you do not have a mask, one will be given to you upon arrival. For doctor visits, patients may have 1 support Trenae Brunke aged 41 or older with them. For treatment visits, patients cannot have anyone with them due to current Covid guidelines and our immunocompromised population.  ? ?Pembrolizumab injection ?What is this medication? ?PEMBROLIZUMAB (pem broe liz ue mab) is a monoclonal antibody. It is used to treat certain types of cancer. ?This medicine may be used for other purposes; ask your health care provider or pharmacist if you have questions. ?COMMON BRAND NAME(S): Keytruda ?  What should I tell my care team before I take this medication? ?They need to know if you have any of these conditions: ?autoimmune diseases like Crohn's disease, ulcerative colitis, or lupus ?have had or planning to have an allogeneic stem cell transplant (uses someone else's stem cells) ?history of  organ transplant ?history of chest radiation ?nervous system problems like myasthenia gravis or Guillain-Barre syndrome ?an unusual or allergic reaction to pembrolizumab, other medicines, foods, dyes, or preservatives ?pregnant or trying to get pregnant ?breast-feeding ?How should I use this medication? ?This medicine is for infusion into a vein. It is given by a health care professional in a hospital or clinic setting. ?A special MedGuide will be given to you before each treatment. Be sure to read this information carefully each time. ?Talk to your pediatrician regarding the use of this medicine in children. While this drug may be prescribed for children as young as 6 months for selected conditions, precautions do apply. ?Overdosage: If you think you have taken too much of this medicine contact a poison control center or emergency room at once. ?NOTE: This medicine is only for you. Do not share this medicine with others. ?What if I miss a dose? ?It is important not to miss your dose. Call your doctor or health care professional if you are unable to keep an appointment. ?What may interact with this medication? ?Interactions have not been studied. ?This list may not describe all possible interactions. Give your health care provider a list of all the medicines, herbs, non-prescription drugs, or dietary supplements you use. Also tell them if you smoke, drink alcohol, or use illegal drugs. Some items may interact with your medicine. ?What should I watch for while using this medication? ?Your condition will be monitored carefully while you are receiving this medicine. ?You may need blood work done while you are taking this medicine. ?Do not become pregnant while taking this medicine or for 4 months after stopping it. Women should inform their doctor if they wish to become pregnant or think they might be pregnant. There is a potential for serious side effects to an unborn child. Talk to your health care professional or  pharmacist for more information. Do not breast-feed an infant while taking this medicine or for 4 months after the last dose. ?What side effects may I notice from receiving this medication? ?Side effects that you should report to your doctor or health care professional as soon as possible: ?allergic reactions like skin rash, itching or hives, swelling of the face, lips, or tongue ?bloody or black, tarry ?breathing problems ?changes in vision ?chest pain ?chills ?confusion ?constipation ?cough ?diarrhea ?dizziness or feeling faint or lightheaded ?fast or irregular heartbeat ?fever ?flushing ?joint pain ?low blood counts - this medicine may decrease the number of white blood cells, red blood cells and platelets. You may be at increased risk for infections and bleeding. ?muscle pain ?muscle weakness ?pain, tingling, numbness in the hands or feet ?persistent headache ?redness, blistering, peeling or loosening of the skin, including inside the mouth ?signs and symptoms of high blood sugar such as dizziness; dry mouth; dry skin; fruity breath; nausea; stomach pain; increased hunger or thirst; increased urination ?signs and symptoms of kidney injury like trouble passing urine or change in the amount of urine ?signs and symptoms of liver injury like dark urine, light-colored stools, loss of appetite, nausea, right upper belly pain, yellowing of the eyes or skin ?sweating ?swollen lymph nodes ?weight loss ?Side effects that usually do not require  medical attention (report to your doctor or health care professional if they continue or are bothersome): ?decreased appetite ?hair loss ?tiredness ?This list may not describe all possible side effects. Call your doctor for medical advice about side effects. You may report side effects to FDA at 1-800-FDA-1088. ?Where should I keep my medication? ?This drug is given in a hospital or clinic and will not be stored at home. ?NOTE: This sheet is a summary. It may not cover all possible  information. If you have questions about this medicine, talk to your doctor, pharmacist, or health care provider. ?? 2023 Elsevier/Gold Standard (2021-05-28 00:00:00) ? ? ?

## 2021-11-20 LAB — T4: T4, Total: 11 ug/dL (ref 4.5–12.0)

## 2021-12-10 ENCOUNTER — Inpatient Hospital Stay: Payer: Medicare PPO | Admitting: Hematology and Oncology

## 2021-12-10 ENCOUNTER — Inpatient Hospital Stay: Payer: Medicare PPO | Attending: Gynecologic Oncology

## 2021-12-10 ENCOUNTER — Inpatient Hospital Stay: Payer: Medicare PPO

## 2021-12-10 ENCOUNTER — Encounter: Payer: Self-pay | Admitting: Hematology and Oncology

## 2021-12-10 VITALS — BP 105/62 | HR 99 | Temp 97.4°F | Resp 18 | Ht 64.0 in | Wt 134.8 lb

## 2021-12-10 VITALS — BP 113/99 | HR 93 | Temp 98.0°F | Resp 18

## 2021-12-10 DIAGNOSIS — Z9079 Acquired absence of other genital organ(s): Secondary | ICD-10-CM | POA: Diagnosis not present

## 2021-12-10 DIAGNOSIS — Z90710 Acquired absence of both cervix and uterus: Secondary | ICD-10-CM | POA: Insufficient documentation

## 2021-12-10 DIAGNOSIS — Z5112 Encounter for antineoplastic immunotherapy: Secondary | ICD-10-CM | POA: Insufficient documentation

## 2021-12-10 DIAGNOSIS — E46 Unspecified protein-calorie malnutrition: Secondary | ICD-10-CM | POA: Insufficient documentation

## 2021-12-10 DIAGNOSIS — Z923 Personal history of irradiation: Secondary | ICD-10-CM | POA: Insufficient documentation

## 2021-12-10 DIAGNOSIS — C55 Malignant neoplasm of uterus, part unspecified: Secondary | ICD-10-CM | POA: Insufficient documentation

## 2021-12-10 DIAGNOSIS — D539 Nutritional anemia, unspecified: Secondary | ICD-10-CM

## 2021-12-10 DIAGNOSIS — G893 Neoplasm related pain (acute) (chronic): Secondary | ICD-10-CM

## 2021-12-10 DIAGNOSIS — Z90721 Acquired absence of ovaries, unilateral: Secondary | ICD-10-CM | POA: Insufficient documentation

## 2021-12-10 LAB — CBC WITH DIFFERENTIAL (CANCER CENTER ONLY)
Abs Immature Granulocytes: 0.05 10*3/uL (ref 0.00–0.07)
Basophils Absolute: 0.1 10*3/uL (ref 0.0–0.1)
Basophils Relative: 1 %
Eosinophils Absolute: 0.1 10*3/uL (ref 0.0–0.5)
Eosinophils Relative: 1 %
HCT: 23.6 % — ABNORMAL LOW (ref 36.0–46.0)
Hemoglobin: 7.4 g/dL — ABNORMAL LOW (ref 12.0–15.0)
Immature Granulocytes: 1 %
Lymphocytes Relative: 14 %
Lymphs Abs: 1.4 10*3/uL (ref 0.7–4.0)
MCH: 21.3 pg — ABNORMAL LOW (ref 26.0–34.0)
MCHC: 31.4 g/dL (ref 30.0–36.0)
MCV: 67.8 fL — ABNORMAL LOW (ref 80.0–100.0)
Monocytes Absolute: 0.6 10*3/uL (ref 0.1–1.0)
Monocytes Relative: 6 %
Neutro Abs: 7.6 10*3/uL (ref 1.7–7.7)
Neutrophils Relative %: 77 %
Platelet Count: 541 10*3/uL — ABNORMAL HIGH (ref 150–400)
RBC: 3.48 MIL/uL — ABNORMAL LOW (ref 3.87–5.11)
RDW: 19.2 % — ABNORMAL HIGH (ref 11.5–15.5)
WBC Count: 9.8 10*3/uL (ref 4.0–10.5)
nRBC: 0 % (ref 0.0–0.2)

## 2021-12-10 LAB — PREPARE RBC (CROSSMATCH)

## 2021-12-10 LAB — CMP (CANCER CENTER ONLY)
ALT: 42 U/L (ref 0–44)
AST: 20 U/L (ref 15–41)
Albumin: 3.1 g/dL — ABNORMAL LOW (ref 3.5–5.0)
Alkaline Phosphatase: 214 U/L — ABNORMAL HIGH (ref 38–126)
Anion gap: 9 (ref 5–15)
BUN: 14 mg/dL (ref 8–23)
CO2: 28 mmol/L (ref 22–32)
Calcium: 9.4 mg/dL (ref 8.9–10.3)
Chloride: 97 mmol/L — ABNORMAL LOW (ref 98–111)
Creatinine: 0.57 mg/dL (ref 0.44–1.00)
GFR, Estimated: >60 mL/min (ref >60)
Glucose, Bld: 124 mg/dL — ABNORMAL HIGH (ref 70–99)
Potassium: 3.7 mmol/L (ref 3.5–5.1)
Sodium: 134 mmol/L — ABNORMAL LOW (ref 135–145)
Total Bilirubin: 0.6 mg/dL (ref 0.3–1.2)
Total Protein: 7 g/dL (ref 6.5–8.1)

## 2021-12-10 LAB — TSH: TSH: 1.378 u[IU]/mL (ref 0.350–4.500)

## 2021-12-10 MED ORDER — SODIUM CHLORIDE 0.9% FLUSH
10.0000 mL | INTRAVENOUS | Status: DC | PRN
  Administered 2021-12-10: 10 mL

## 2021-12-10 MED ORDER — SODIUM CHLORIDE 0.9% FLUSH
10.0000 mL | Freq: Once | INTRAVENOUS | Status: AC
  Administered 2021-12-10: 10 mL

## 2021-12-10 MED ORDER — SODIUM CHLORIDE 0.9 % IV SOLN: Freq: Once | INTRAVENOUS | Status: AC

## 2021-12-10 MED ORDER — DIPHENHYDRAMINE HCL 25 MG PO CAPS
25.0000 mg | ORAL_CAPSULE | Freq: Once | ORAL | Status: AC
  Administered 2021-12-10: 25 mg via ORAL
  Filled 2021-12-10: qty 1

## 2021-12-10 MED ORDER — ACETAMINOPHEN 325 MG PO TABS
650.0000 mg | ORAL_TABLET | Freq: Once | ORAL | Status: AC
  Administered 2021-12-10: 650 mg via ORAL
  Filled 2021-12-10: qty 2

## 2021-12-10 MED ORDER — SODIUM CHLORIDE 0.9% IV SOLUTION
250.0000 mL | Freq: Once | INTRAVENOUS | Status: AC
  Administered 2021-12-10: 250 mL via INTRAVENOUS

## 2021-12-10 MED ORDER — HEPARIN SOD (PORK) LOCK FLUSH 100 UNIT/ML IV SOLN
500.0000 [IU] | Freq: Once | INTRAVENOUS | Status: AC | PRN
  Administered 2021-12-10: 500 [IU]

## 2021-12-10 MED ORDER — SODIUM CHLORIDE 0.9 % IV SOLN
200.0000 mg | Freq: Once | INTRAVENOUS | Status: AC
  Administered 2021-12-10: 200 mg via INTRAVENOUS
  Filled 2021-12-10: qty 200

## 2021-12-10 NOTE — Assessment & Plan Note (Signed)
This is multifactorial, a component of iron deficiency and anemia chronic illness We discussed some of the risks, benefits, and alternatives of blood transfusions. The patient is symptomatic from anemia and the hemoglobin level is critically low.  Some of the side-effects to be expected including risks of transfusion reactions, chills, infection, syndrome of volume overload and risk of hospitalization from various reasons and the patient is willing to proceed and went ahead to sign consent today. She will receive a unit of blood today

## 2021-12-10 NOTE — Assessment & Plan Note (Signed)
She has no pain She has pain medicine to take as needed

## 2021-12-10 NOTE — Progress Notes (Signed)
Hgb 7.4. Dr. Mallie Mussel ordered 1 unit of blood to be given today with treatment.

## 2021-12-10 NOTE — Progress Notes (Signed)
[facility] OFFICE PROGRESS NOTE  Patient Care Team: Pcp, No as PCP - General Paulina Fusi Servando Snare, RN as Oncology Nurse Navigator (Oncology)  ASSESSMENT & PLAN:  Uterine cancer (HCC) Clinically, she is responding to treatment Her pelvic pain completely disappeared, pelvic bleeding has resolved and palpable skin nodules have also disappeared She has lost weight due to inability to get good access to food She is anemic today which I believe is related to her malnutrition state We will provide treatment along with transfusion support I will also schedule intravenous iron infusion/blood transfusion in her next visit in addition to her treatment in 3 weeks  Deficiency anemia This is multifactorial, a component of iron deficiency and anemia chronic illness We discussed some of the risks, benefits, and alternatives of blood transfusions. The patient is symptomatic from anemia and the hemoglobin level is critically low.  Some of the side-effects to be expected including risks of transfusion reactions, chills, infection, syndrome of volume overload and risk of hospitalization from various reasons and the patient is willing to proceed and went ahead to sign consent today. She will receive a unit of blood today  Cancer associated pain She has no pain She has pain medicine to take as needed  Malnutrition (HCC) She had profound weight loss Her problem is inability to get access to food Her pastor is present and we will arrange for church members to bring food to her home I emphasized importance of high-protein diet  Orders Placed This Encounter  Procedures   Care order/instruction    Transfuse Parameters    Standing Status:   Future    Standing Expiration Date:   12/10/2022   Informed Consent Details: Physician/Practitioner Attestation; Transcribe to consent form and obtain patient signature    Standing Status:   Future    Standing Expiration Date:   12/11/2022    Order Specific  Question:   Physician/Practitioner attestation of informed consent for blood and or blood product transfusion    Answer:   I, the physician/practitioner, attest that I have discussed with the patient the benefits, risks, side effects, alternatives, likelihood of achieving goals and potential problems during recovery for the procedure that I have provided informed consent.    Order Specific Question:   Product(s)    Answer:   All Product(s)   Type and screen         Standing Status:   Future    Number of Occurrences:   1    Standing Expiration Date:   12/11/2022   Prepare RBC (crossmatch)    Standing Status:   Standing    Number of Occurrences:   1    Order Specific Question:   # of Units    Answer:   1 unit    Order Specific Question:   Transfusion Indications    Answer:   Symptomatic Anemia    Order Specific Question:   Number of Units to Keep Ahead    Answer:   NO units ahead    Order Specific Question:   Instructions:    Answer:   Transfuse    Order Specific Question:   If emergent release call blood bank    Answer:   Not emergent release   Sample to Blood Bank    Standing Status:   Standing    Number of Occurrences:   33    Standing Expiration Date:   12/11/2022   ABO/Rh    Standing Status:   Future  Number of Occurrences:   1    Standing Expiration Date:   12/11/2022    All questions were answered. The patient knows to call the clinic with any problems, questions or concerns. The total time spent in the appointment was 40 minutes encounter with patients including review of chart and various tests results, discussions about plan of care and coordination of care plan   [name], MD 12/10/2021 10:12 AM  INTERVAL HISTORY: Please see below for problem oriented charting. she returns for treatment follow-up seen prior to cycle 2 of treatment She have lost a lot of weight since her last visit She responded to treatment Her pelvic pain and bleeding has disappeared The palpable  subcutaneous nodule has disappeared She lost weight because she was not able to cook for herself She is hungry The patient denies any recent signs or symptoms of bleeding such as spontaneous epistaxis, hematuria or hematochezia.   REVIEW OF SYSTEMS:   Constitutional: Denies fevers, chills  Eyes: Denies blurriness of vision Ears, nose, mouth, throat, and face: Denies mucositis or sore throat Respiratory: Denies cough, dyspnea or wheezes Cardiovascular: Denies palpitation, chest discomfort or lower extremity swelling Gastrointestinal:  Denies nausea, heartburn or change in bowel habits Skin: Denies abnormal skin rashes Lymphatics: Denies new lymphadenopathy or easy bruising Neurological:Denies numbness, tingling or new weaknesses Behavioral/Psych: Mood is stable, no new changes  All other systems were reviewed with the patient and are negative.  I have reviewed the past medical history, past surgical history, social history and family history with the patient and they are unchanged from previous note.  ALLERGIES:  has No Known Allergies.  MEDICATIONS:  Current Outpatient Medications  Medication Sig Dispense Refill   acetaminophen (TYLENOL) 325 MG tablet Take 650 mg by mouth as needed.     lidocaine-prilocaine (EMLA) cream Apply to affected area once 30 g 3   magnesium hydroxide (MILK OF MAGNESIA) 400 MG/5ML suspension Take 5 mLs by mouth daily as needed for mild constipation.     morphine (MSIR) 15 MG tablet Take 1 tablet (15 mg total) by mouth every 6 (six) hours as needed for severe pain. 90 tablet 0   ondansetron (ZOFRAN) 8 MG tablet Take 1 tablet (8 mg total) by mouth every 8 (eight) hours as needed. (Patient not taking: Reported on 07/28/2021) 30 tablet 1   polyethylene glycol (MIRALAX / GLYCOLAX) 17 g packet Take 17 g by mouth daily. 30 each 1   prochlorperazine (COMPAZINE) 10 MG tablet Take 1 tablet (10 mg total) by mouth every 6 (six) hours as needed (Nausea or vomiting).  (Patient not taking: Reported on 07/28/2021) 30 tablet 1   No current facility-administered medications for this visit.    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   Uterine cancer ([place])  01/05/2021 Imaging   Outside CT imaging  Bilateral hydronephrosis related extrinsic compression on the ureters by the enlarged uterus.   Multiple calcified uterine fibroids although peripherally enhancing diffuse decreased attenuation is noted centrally within the uterus highly suspicious for endometrial neoplasm given the current history of vaginal bleeding. Further evaluation is recommended. MRI may be helpful. Additionally tissue sampling would likely be helpful.   Hypodensity in the pancreas at the junction of the head and body of the pancreas.  This may be related to volume averaging although the possibility of a cystic lesion could not be totally excluded. MRI is recommended for further evaluation.    01/05/2021 Initial Diagnosis   The patient reported at least 1 year of postmenopausal bleeding.  She does not have a primary care provider or gynecologist.  She had known fibroids and had noted that she needed a hysterectomy but had not gotten around to seeking care for this.  She presented to the [facility] emergency department on January 05, 2021 with new onset right lower quadrant pain and this prompted a CT scan of the abdomen and pelvis to be performed on 01/05/2021.     01/14/2021 Pathology Results   FINAL MICROSCOPIC DIAGNOSIS:   A. CERVIX, ENDOCERVIX, BIOPSY:  -  Endometrioid carcinoma, FIGO grade 1  -  See comment    01/26/2021 Imaging   MRI abdomen  1. No suspicious pancreatic mass identified. 2. Moderate to severe bilateral  hydroureteronephrosis, similar to recent CT the abdomen and pelvis, presumably related to extrinsic ureteral compression from the patient's enlarged uterus, as demonstrated on recent CT the abdomen and pelvis 01/05/2021.   02/01/2021 Surgery   Surgery in [facility]  Date of Service: February 01, 2021 3:13 PM  Preoperative Diagnoses: Endometrial cancer  Postoperative Diagnoses: Endometrial cancer  Procedures: Total abdominal hysterectomy, bilateral salpingo-oophorectomy, left pelvic lymphadenectomy, right para-aortic lymphadenectomy  Surgeon: Everitt Amber, MD  Findings: Tumor appreciated in the cervical canal. Large, broad, multi fibroid uterus with a calcified fibroid filling the posterior cul de sac, an anterior lower uterine segment fibroid, and bilateral fibroids near the level of the cardinal ligament. No evidence of intraperitoneal disease. Normal appearing bilateral tubes and ovaries. Bilateral hydroureter from uterine compression. Tumor excreted from specimen into the pelvis at time of removal. An approximately 2cm enlarged left pelvic lymph node and a 1cm enlarged right aorta caval lymph node were identified.     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) - Right ovary, negative for tumor - Bilateral fallopian tubes, negative for tumor - Multiple leiomyomata (up to 8.5 cm)   B: Lymph node, aortocaval, excision - Metastatic carcinoma involving 1 lymph node (0.4 cm) (1/1)   C: Lymph node, left pelvic, excision -Metastatic carcinoma involving 1 lymph node (0.3 cm) (1/1)   Diagnosis Comment    The majority of this tumor is composed of well  differentiated (overall grade 2, but with areas consistent with grade 1) endometrioid adenocarcinoma with squamous and mucinous differentiation (approximately 70%); multifocally there is abrupt transition to an undifferentiated malignancy with a high-grade monotonous noncohesive appearance (best seen in slide A2) consistent with undifferentiated carcinoma.  The overall morphologic and immunophenotypic findings are consistent with a dedifferentiated endometrial carcinoma.   Of note, the carcinoma present in the lymph nodes is well differentiated endometrioid carcinoma and the metastatic deposit in the ovary is undifferentiated carcinoma. Carcinoma is present less than 1 mm from uterine corpus serosa (in the lower uterine segment).  The assessment of the paracervical soft tissue margins is challenging due to the disrupted nature of the specimen in this area, however tumor appears present less than 1 mm from paracervical soft tissue.  No definite parametrial invasion is identified.  Immunohistochemistry performed at [facility] (with appropriate controls) demonstrates the following immunoprofile in cells of interest: Well differentiated endometrioid carcinoma: CK AE1/AE3: Positive ER: Positive (>90%, strong staining) P53: Favor wild-type (see comment below)   Dedifferentiated carcinoma: CK AE1/AE3: Very rare cells, predominantly negative ER: Negative P53: Small focus of mutant type staining in a background of wild-type staining (see comment below)   P53 staining comment: In the well differentiated carcinoma component the staining pattern is predominantly wild-type although focally there is slightly increased levels of staining noted.  In the dedifferentiated tumor component, most of the tissue exhibits wild-type pattern staining, however there is a distinct small area that exhibits increased/overexpressed mutant pattern p53 staining.    Immunohistochemistry testing for mismatch repair (MMR) proteins is  performed on block A2:   MLH1:  Loss of nuclear expression MSH2:  Intact nuclear expression MSH6:  Intact nuclear expression PMS2:  Loss of nuclear expression   Loss of nuclear expression of MLH1 and PMS2 can be a result of sporadic microsatellite instability (via hypermethylation of the MLH1 promoter region) or due to Lynch syndrome / [place]. Materials will be submitted to the Molecular Pathology Laboratory [phone]) for [facility] promoter hypermethylation testing. These results will be issued in a separate report.      03/05/2021 Initial Diagnosis   Uterine cancer ([place])   03/05/2021 Cancer Staging   Staging form: Corpus Uteri - Carcinoma and Carcinosarcoma, AJCC 8th Edition - Pathologic stage from 03/05/2021: FIGO Stage IVB (rpT3, pN2a, cM1) - Signed by [name], MD on 11/12/2021 Stage prefix: Recurrence    03/11/2021 Imaging   1. Status post interval hysterectomy and oophorectomy. 2. There is a rim enhancing soft tissue mass centered in the vagina, measuring approximately 4.4 x 3.1 cm. This is new, or significantly enlarged compared to prior examination dated 01/05/2021. Findings are concerning for malignant involvement. Correlate with physical examination. Contrast enhanced MRI may be helpful to further detail soft tissue anatomy of the low pelvis if indicated. 3. There is at least one newly enlarged left iliac lymph node measuring up to 1.4 x 1.3 cm, nonspecific although concerning for nodal metastatic disease. 4. Fluid attenuation lymphocele or seroma adjacent to surgical clip at the level right ovarian vein. 5. Status post interval midline laparotomy. Subcutaneous fluid within the inferior aspect of the wound. The presence or absence of infection is not established by CT.     03/23/2021 - 07/09/2021 Chemotherapy   Patient is on Treatment Plan : UTERINE Carboplatin AUC 6 / Paclitaxel q21d      03/23/2021 Procedure   Placement of a subcutaneous power-injectable port device. Catheter tip  at the superior cavoatrial junction   07/30/2021 Imaging   1. Large ill-defined partially enhancing and likely centrally necrotic vaginal and paravaginal mass. Recommend correlation with direct visualization. MRI pelvis without and with contrast may be helpful for further evaluation. 2. No findings suspicious for omental or peritoneal surface disease. No adenopathy.   08/09/2021 - 09/08/2021 Radiation Therapy   Indication for treatment:  Curative       Radiation treatment dates:   08/09/21 through 09/08/21 Site/dose:  IMRT Uterus; Vagina : 41.5 Gy delivered in 23 Fx with 1.80 Gy/Fx (Planned dosage was for 45.00 GY delivered in 25 Fx with 1.80 Gy/Fx, her final 2 treatments (and boost) was canceled secondary to significant radiation reaction)   Beams/energy: 6X   11/11/2021 Imaging   1. 11 mm left upper lobe pulmonary nodule in the apicoposterior segment abuts the posterior pleural margin. In this  clinical context the appearance is very concerning for a metastatic lesion although we do not have prior cross-sectional imaging to assess stability. Proximity to the fifth rib and adjacent major fissure might make percutaneous biopsy tricky; PET-CT would also be one way of assessing the metabolic activity/malignant potential of this lesion. This may be a good candidate case for discussion at multidisciplinary cancer conference. 2. Increased adenopathy along the left iliac chain, including an internal iliac node which abuts the left sacral plexus. 3. The lower vaginal and vestibular mass measures about 7.0 by 5.5 by 5.9 cm and has some internal gas which may indicate ulceration along the vestibule/perineum. Poor definition of tissue planes between this process in the base of the urinary bladder as well as the anus. The mass extends along the left anterior obturator internus muscle. 4. Other imaging findings of potential clinical significance: Prominent main pulmonary artery, cannot exclude pulmonary arterial  hypertension. Aortic Atherosclerosis (ICD10-I70.0). Lower lumbar spondylosis and degenerative disc disease.   11/19/2021 -  Chemotherapy   Patient is on Treatment Plan : UTERINE Pembrolizumab (200) q21d        PHYSICAL EXAMINATION: ECOG PERFORMANCE STATUS: 2 - Symptomatic, <50% confined to bed  Vitals:   12/10/21 0931  BP: 105/62  Pulse: 99  Resp: 18  Temp: (!) 97.4 F (36.3 C)  SpO2: 100%   Filed Weights   12/10/21 0931  Weight: 134 lb 12.8 oz (61.1 kg)    GENERAL:alert, no distress and comfortable NEURO: alert & oriented x 3 with fluent speech, no focal motor/sensory deficits  LABORATORY DATA:  I have reviewed the data as listed    Component Value Date/Time   NA 134 (L) 12/10/2021 0857   K 3.7 12/10/2021 0857   CL 97 (L) 12/10/2021 0857   CO2 28 12/10/2021 0857   GLUCOSE 124 (H) 12/10/2021 0857   BUN 14 12/10/2021 0857   CREATININE 0.57 12/10/2021 0857   CALCIUM 9.4 12/10/2021 0857   PROT 7.0 12/10/2021 0857   ALBUMIN 3.1 (L) 12/10/2021 0857   AST 20 12/10/2021 0857   ALT 42 12/10/2021 0857   ALKPHOS 214 (H) 12/10/2021 0857   BILITOT 0.6 12/10/2021 0857   GFRNONAA >60 12/10/2021 0857    No results found for: SPEP, UPEP  Lab Results  Component Value Date   WBC 9.8 12/10/2021   NEUTROABS 7.6 12/10/2021   HGB 7.4 (L) 12/10/2021   HCT 23.6 (L) 12/10/2021   MCV 67.8 (L) 12/10/2021   PLT 541 (H) 12/10/2021      Chemistry      Component Value Date/Time   NA 134 (L) 12/10/2021 0857   K 3.7 12/10/2021 0857   CL 97 (L) 12/10/2021 0857   CO2 28 12/10/2021 0857   BUN 14 12/10/2021 0857   CREATININE 0.57 12/10/2021 0857      Component Value Date/Time   CALCIUM 9.4 12/10/2021 0857   ALKPHOS 214 (H) 12/10/2021 0857   AST 20 12/10/2021 0857   ALT 42 12/10/2021 0857   BILITOT 0.6 12/10/2021 0857

## 2021-12-10 NOTE — Patient Instructions (Addendum)
[facility]  Discharge Instructions: Thank you for choosing [facility] to provide your oncology and hematology care.   If you have a lab appointment with the [facility], please go directly to the [facility] and check in at the registration area.   Wear comfortable clothing and clothing appropriate for easy access to any Portacath or PICC line.   We strive to give you quality time with your provider. You may need to reschedule your appointment if you arrive late (15 or more minutes).  Arriving late affects you and other patients whose appointments are after yours.  Also, if you miss three or more appointments without notifying the office, you may be dismissed from the clinic at the provider's discretion.      For prescription refill requests, have your pharmacy contact our office and allow 72 hours for refills to be completed.    Today you received the following chemotherapy and/or immunotherapy agents: Pembrolizumab Beryle Flock)      To help prevent nausea and vomiting after your treatment, we encourage you to take your nausea medication as directed.  BELOW ARE SYMPTOMS THAT SHOULD BE REPORTED IMMEDIATELY: *FEVER GREATER THAN 100.4 F (38 C) OR HIGHER *CHILLS OR SWEATING *NAUSEA AND VOMITING THAT IS NOT CONTROLLED WITH YOUR NAUSEA MEDICATION *UNUSUAL SHORTNESS OF BREATH *UNUSUAL BRUISING OR BLEEDING *URINARY PROBLEMS (pain or burning when urinating, or frequent urination) *BOWEL PROBLEMS (unusual diarrhea, constipation, pain near the anus) TENDERNESS IN MOUTH AND THROAT WITH OR WITHOUT PRESENCE OF ULCERS (sore throat, sores in mouth, or a toothache) UNUSUAL RASH, SWELLING OR PAIN  UNUSUAL VAGINAL DISCHARGE OR ITCHING   Items with * indicate a potential emergency and should be followed up as soon as possible or go to the Emergency Department if any problems should occur.  Please show the CHEMOTHERAPY ALERT CARD or IMMUNOTHERAPY ALERT CARD  at check-in to the Emergency Department and triage nurse.  Should you have questions after your visit or need to cancel or reschedule your appointment, please contact [facility]  Dept: [phone]  and follow the prompts.  Office hours are 8:00 a.m. to 4:30 p.m. Monday - Friday. Please note that voicemails left after 4:00 p.m. may not be returned until the following business day.  We are closed weekends and major holidays. You have access to a nurse at all times for urgent questions. Please call the main number to the clinic Dept: [phone] and follow the prompts.   For any non-urgent questions, you may also contact your provider using MyChart. We now offer e-Visits for anyone 70 and older to request care online for non-urgent symptoms. For details visit [email or web address].   Also download the MyChart app! Go to the app store, search "MyChart", open the app, select [facility], and log in with your MyChart username and password.  Due to Covid, a mask is required upon entering the hospital/clinic. If you do not have a mask, one will be given to you upon arrival. For doctor visits, patients may have 1 support person aged 70 or older with them. For treatment visits, patients cannot have anyone with them due to current Covid guidelines and our immunocompromised population.   Blood Transfusion, Adult A blood transfusion is a procedure in which you receive blood through an IV tube. You may need this procedure because of: A bleeding disorder. An illness. An injury. A surgery. The blood may come from someone else (a donor). You may also be able  to donate blood for yourself. The blood given in a transfusion is made up of different types of cells. You may get: Red blood cells. These carry oxygen to the cells in the body. White blood cells. These help you fight infections. Platelets. These help your blood to clot. Plasma. This is the liquid part of your blood. It  carries proteins and other substances through the body. If you have a clotting disorder, you may also get other types of blood products. Tell your doctor about: Any blood disorders you have. Any reactions you have had during a blood transfusion in the past. Any allergies you have. All medicines you are taking, including vitamins, herbs, eye drops, creams, and over-the-counter medicines. Any surgeries you have had. Any medical conditions you have. This includes any recent fever or cold symptoms. Whether you are pregnant or may be pregnant. What are the risks? Generally, this is a safe procedure. However, problems may occur. The most common problems include: A mild allergic reaction. This includes red, swollen areas of skin (hives) and itching. Fever or chills. This may be the body's response to new blood cells received. This may happen during or up to 4 hours after the transfusion. More serious problems may include: Too much fluid in the lungs. This may cause breathing problems. A serious allergic reaction. This includes breathing trouble or swelling around the face and lips. Lung injury. This causes breathing trouble and low oxygen in the blood. This can happen within hours of the transfusion or days later. Too much iron. This can happen after getting many blood transfusions over a period of time. An infection or virus passed through the blood. This is rare. Donated blood is carefully tested before it is given. Your body's defense system (immune system) trying to attack the new blood cells. This is rare. Symptoms may include fever, chills, nausea, low blood pressure, and low back or chest pain. Donated cells attacking healthy tissues. This is rare. What happens before the procedure? Medicines Ask your doctor about: Changing or stopping your normal medicines. This is important. Taking aspirin and ibuprofen. Do not take these medicines unless your doctor tells you to take them. Taking  over-the-counter medicines, vitamins, herbs, and supplements. General instructions Follow instructions from your doctor about what you cannot eat or drink. You will have a blood test to find out your blood type. The test also finds out what type of blood your body will accept and matches it to the donor type. If you are going to have a planned surgery, you may be able to donate your own blood. This may be done in case you need a transfusion. You will have your temperature, blood pressure, and pulse checked. You may receive medicine to help prevent an allergic reaction. This may be done if you have had a reaction to a transfusion before. This medicine may be given to you by mouth or through an IV tube. This procedure lasts about 1-4 hours. Plan for the time you need. What happens during the procedure?  An IV tube will be put into one of your veins. The bag of donated blood will be attached to your IV tube. Then, the blood will enter through your vein. Your temperature, blood pressure, and pulse will be checked often. This is done to find early signs of a transfusion reaction. Tell your nurse right away if you have any of these symptoms: Shortness of breath or trouble breathing. Chest or back pain. Fever or chills. Red, swollen areas  of skin or itching. If you have any signs or symptoms of a reaction, your transfusion will be stopped. You may also be given medicine. When the transfusion is finished, your IV tube will be taken out. Pressure may be put on the IV site for a few minutes. A bandage (dressing) will be put on the IV site. The procedure may vary among doctors and hospitals. What happens after the procedure? You will be monitored until you leave the hospital or clinic. This includes checking your temperature, blood pressure, pulse, breathing rate, and blood oxygen level. Your blood may be tested to see how you are responding to the transfusion. You may be warmed with fluids or  blankets. This is done to keep the temperature of your body normal. If you have your procedure in an outpatient setting, you will be told whom to contact to report any reactions. Where to find more information To learn more, visit the [employer]: redcross.org Summary A blood transfusion is a procedure in which you are given blood through an IV tube. The blood may come from someone else (a donor). You may also be able to donate blood for yourself. The blood you are given is made up of different blood cells. You may receive red blood cells, platelets, plasma, or white blood cells. Your temperature, blood pressure, and pulse will be checked often. After the procedure, your blood may be tested to see how you are responding. This information is not intended to replace advice given to you by your health care provider. Make sure you discuss any questions you have with your health care provider. Document Revised: 12/20/2018 Document Reviewed: 12/20/2018 Elsevier Patient Education  [place].

## 2021-12-10 NOTE — Assessment & Plan Note (Signed)
Clinically, she is responding to treatment Her pelvic pain completely disappeared, pelvic bleeding has resolved and palpable skin nodules have also disappeared She has lost weight due to inability to get good access to food She is anemic today which I believe is related to her malnutrition state We will provide treatment along with transfusion support I will also schedule intravenous iron infusion/blood transfusion in her next visit in addition to her treatment in 3 weeks

## 2021-12-10 NOTE — Assessment & Plan Note (Signed)
She had profound weight loss Her problem is inability to get access to food Her pastor is present and we will arrange for church members to bring food to her home I emphasized importance of high-protein diet

## 2021-12-11 LAB — T4: T4, Total: 9.2 ug/dL (ref 4.5–12.0)

## 2021-12-12 LAB — TYPE AND SCREEN
ABO/RH(D): O POS
Antibody Screen: NEGATIVE
Unit division: 0

## 2021-12-12 LAB — BPAM RBC
Blood Product Expiration Date: 202306292359
ISSUE DATE / TIME: 202306021150
Unit Type and Rh: 5100

## 2021-12-23 ENCOUNTER — Telehealth: Payer: Self-pay

## 2021-12-23 NOTE — Telephone Encounter (Signed)
Called and given below message. She verbalized understanding and will bring medication bottles to appt.

## 2021-12-23 NOTE — Telephone Encounter (Signed)
Ok, I will see her tomorrow Can you get her to bring all her medication bottles?

## 2021-12-23 NOTE — Telephone Encounter (Signed)
Returned her call. She called the scheduler earlier and is scheduled with Dr. Alvy Bimler tomorrow at 1120. She just wanted to let Dr. Alvy Bimler know that is having a lot of pressure towards her rectum that is making it hard for her to walk and that is why she wants to see her. 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 Denies problems with voiding and bm's. Denies pain. Offered appt today and she declined appt today due to transportation.

## 2021-12-24 ENCOUNTER — Inpatient Hospital Stay: Payer: Medicare PPO | Admitting: Hematology and Oncology

## 2021-12-24 ENCOUNTER — Other Ambulatory Visit: Payer: Self-pay

## 2021-12-24 ENCOUNTER — Encounter: Payer: Self-pay | Admitting: Hematology and Oncology

## 2021-12-24 DIAGNOSIS — Z90721 Acquired absence of ovaries, unilateral: Secondary | ICD-10-CM | POA: Diagnosis not present

## 2021-12-24 DIAGNOSIS — E46 Unspecified protein-calorie malnutrition: Secondary | ICD-10-CM

## 2021-12-24 DIAGNOSIS — D539 Nutritional anemia, unspecified: Secondary | ICD-10-CM | POA: Diagnosis not present

## 2021-12-24 DIAGNOSIS — K5909 Other constipation: Secondary | ICD-10-CM | POA: Diagnosis not present

## 2021-12-24 DIAGNOSIS — Z923 Personal history of irradiation: Secondary | ICD-10-CM | POA: Diagnosis not present

## 2021-12-24 DIAGNOSIS — Z90710 Acquired absence of both cervix and uterus: Secondary | ICD-10-CM | POA: Diagnosis not present

## 2021-12-24 DIAGNOSIS — C55 Malignant neoplasm of uterus, part unspecified: Secondary | ICD-10-CM | POA: Diagnosis not present

## 2021-12-24 DIAGNOSIS — R198 Other specified symptoms and signs involving the digestive system and abdomen: Secondary | ICD-10-CM | POA: Insufficient documentation

## 2021-12-24 DIAGNOSIS — Z9079 Acquired absence of other genital organ(s): Secondary | ICD-10-CM | POA: Diagnosis not present

## 2021-12-24 DIAGNOSIS — G893 Neoplasm related pain (acute) (chronic): Secondary | ICD-10-CM | POA: Diagnosis not present

## 2021-12-24 DIAGNOSIS — Z5112 Encounter for antineoplastic immunotherapy: Secondary | ICD-10-CM | POA: Diagnosis not present

## 2021-12-24 NOTE — Assessment & Plan Note (Signed)
Her pain has completely resolved She has pain medicine to take as needed

## 2021-12-24 NOTE — Assessment & Plan Note (Signed)
Overall, the patient has positive response to treatment with resolution of genital lesions/pelvic lymphadenopathy The patient is reassured I will see her next week as scheduled

## 2021-12-24 NOTE — Assessment & Plan Note (Signed)
She denies recent constipation I emphasized the importance of laxative therapy

## 2021-12-24 NOTE — Progress Notes (Signed)
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 Overall, her examination reviewed normal slight increase rectal tone There is some stool in the rectal vault which is not unexpected The patient is reassured I recommend she continues taking stool softener and laxative to prevent constipation  Cancer associated pain Her pain has completely resolved She has pain medicine to take as needed  Other constipation She denies recent constipation I emphasized the importance of laxative therapy  Malnutrition ([place]) She had recent weight loss due to difficulties with access to food She is gaining weight now Her church members are taking turns delivering food to her house We discussed importance of high-protein intake  No orders of the defined types were placed in this encounter.   All questions were answered. The patient knows to call the clinic with any problems, questions or concerns. The total time spent in the appointment was 30 minutes encounter with patients including review of chart and various tests results, discussions about plan of care and coordination of care plan   [name], MD 12/24/2021 12:27 PM  INTERVAL HISTORY: Please see below for problem oriented charting. she returns for urgent evaluation The patient has recent rectal pressure that is quite intense at times She denies constipation Her pelvic pain has resolved and she is no longer taking pain medicine Denies recent nausea All the genital skin lesions had  disappeared Her previous palpable lymphadenopathy in the groin has resolved She is eating better  REVIEW OF SYSTEMS:   Constitutional: Denies fevers, chills or abnormal weight loss Eyes: Denies blurriness of vision Ears, nose, mouth, throat, and face: Denies mucositis or sore throat Respiratory: Denies cough, dyspnea or wheezes Cardiovascular: Denies palpitation, chest discomfort or lower extremity swelling Gastrointestinal:  Denies nausea, heartburn or change in bowel habits Skin: Denies abnormal skin rashes Lymphatics: Denies new lymphadenopathy or easy bruising Neurological:Denies numbness, tingling or new weaknesses Behavioral/Psych: Mood is stable, no new changes  All other systems were reviewed with the patient and are negative.  I have reviewed the past medical history, past surgical history, social history and family history with the patient and they are unchanged from previous note.  ALLERGIES:  has No Known Allergies.  MEDICATIONS:  Current Outpatient Medications  Medication Sig Dispense Refill   acetaminophen (TYLENOL) 325 MG tablet Take 650 mg by mouth as needed.     lidocaine-prilocaine (EMLA) cream Apply to affected area once 30 g 3   magnesium hydroxide (MILK OF MAGNESIA) 400 MG/5ML suspension Take 5 mLs by mouth daily as needed for mild constipation.     morphine (MSIR) 15 MG tablet Take 1 tablet (15 mg total) by mouth every 6 (six) hours as needed for severe pain. 90 tablet 0   ondansetron (ZOFRAN) 8 MG tablet Take 1 tablet (8 mg total) by mouth every 8 (eight) hours as needed. (Patient not taking: Reported on 07/28/2021) 30 tablet 1   polyethylene glycol (MIRALAX / GLYCOLAX) 17 g packet Take 17 g by mouth daily. 30 each 1   prochlorperazine (COMPAZINE) 10 MG tablet Take 1 tablet (10  mg total) by mouth every 6 (six) hours as needed (Nausea or vomiting). (Patient not taking: Reported on 07/28/2021) 30 tablet 1   No current facility-administered medications for this  visit.    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   Uterine cancer ([place])  01/05/2021 Imaging   Outside CT imaging  Bilateral hydronephrosis related extrinsic compression on the ureters by the enlarged uterus.   Multiple calcified uterine fibroids although peripherally enhancing diffuse decreased attenuation is noted centrally within the uterus highly suspicious for endometrial neoplasm given the current history of vaginal bleeding. Further evaluation is recommended. MRI may be helpful. Additionally tissue sampling would likely be helpful.   Hypodensity in the pancreas at the junction of the head and body of the pancreas. This may be related to volume averaging although the possibility of a cystic lesion could not be totally excluded. MRI is recommended for further evaluation.    01/05/2021 Initial Diagnosis   The patient reported at least 1 year of postmenopausal bleeding.  She does not have a primary care provider or gynecologist.  She had known fibroids and had noted that she needed a hysterectomy but had not gotten around to seeking care for this.  She presented to the [facility] emergency department on January 05, 2021 with new onset right lower quadrant pain and this prompted a CT scan of the abdomen and pelvis to be performed on 01/05/2021.     01/14/2021 Pathology Results   FINAL MICROSCOPIC DIAGNOSIS:   A. CERVIX, ENDOCERVIX, BIOPSY:  -  Endometrioid carcinoma, FIGO grade 1  -  See comment    01/26/2021 Imaging   MRI abdomen  1. No suspicious pancreatic mass identified. 2. Moderate to severe bilateral hydroureteronephrosis, similar to recent CT the abdomen and pelvis, presumably related to extrinsic ureteral compression  from the patient's enlarged uterus, as demonstrated on recent CT the abdomen and pelvis 01/05/2021.   02/01/2021 Surgery   Surgery in [facility]  Date of Service: February 01, 2021 3:13 PM  Preoperative Diagnoses: Endometrial cancer  Postoperative Diagnoses: Endometrial cancer  Procedures: Total abdominal hysterectomy, bilateral salpingo-oophorectomy, left pelvic lymphadenectomy, right para-aortic lymphadenectomy  Surgeon: Everitt Amber, MD  Findings: Tumor appreciated in the cervical canal. Large, broad, multi fibroid uterus with a calcified fibroid filling the posterior cul de sac, an anterior lower uterine segment fibroid, and bilateral fibroids near the level of the cardinal ligament. No evidence of intraperitoneal disease. Normal appearing bilateral tubes and ovaries. Bilateral hydroureter from uterine compression. Tumor excreted from specimen into the pelvis at time of removal. An approximately 2cm enlarged left pelvic lymph node and a 1cm enlarged right aorta caval lymph node were identified.     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) - Right ovary, negative for tumor - Bilateral fallopian tubes, negative for tumor - Multiple leiomyomata (up to 8.5 cm)   B: Lymph node, aortocaval, excision - Metastatic carcinoma involving 1 lymph node (0.4 cm) (1/1)   C: Lymph node, left  pelvic, excision -Metastatic carcinoma involving 1 lymph node (0.3 cm) (1/1)   Diagnosis Comment    The majority of this tumor is composed of well differentiated (overall grade 2, but with areas consistent with grade 1) endometrioid adenocarcinoma with squamous and  mucinous differentiation (approximately 70%); multifocally there is abrupt transition to an undifferentiated malignancy with a high-grade monotonous noncohesive appearance (best seen in slide A2) consistent with undifferentiated carcinoma.  The overall morphologic and immunophenotypic findings are consistent with a dedifferentiated endometrial carcinoma.   Of note, the carcinoma present in the lymph nodes is well differentiated endometrioid carcinoma and the metastatic deposit in the ovary is undifferentiated carcinoma. Carcinoma is present less than 1 mm from uterine corpus serosa (in the lower uterine segment).  The assessment of the paracervical soft tissue margins is challenging due to the disrupted nature of the specimen in this area, however tumor appears present less than 1 mm from paracervical soft tissue.  No definite parametrial invasion is identified.   Immunohistochemistry performed at [facility] (with appropriate controls) demonstrates the following immunoprofile in cells of interest: Well differentiated endometrioid carcinoma: CK AE1/AE3: Positive ER: Positive (>90%, strong staining) P53: Favor wild-type (see comment below)   Dedifferentiated carcinoma: CK AE1/AE3: Very rare cells, predominantly negative ER: Negative P53: Small focus of mutant type staining in a background of wild-type staining (see comment below)   P53 staining comment: In the well differentiated carcinoma component the staining pattern is predominantly wild-type although focally there is slightly increased levels of staining noted.  In the dedifferentiated tumor component, most of the tissue exhibits wild-type pattern staining, however there is a distinct small area that exhibits increased/overexpressed mutant pattern p53 staining.    Immunohistochemistry testing for mismatch repair (MMR) proteins is performed on block A2:   MLH1:  Loss of nuclear expression MSH2:  Intact nuclear expression MSH6:  Intact nuclear  expression PMS2:  Loss of nuclear expression   Loss of nuclear expression of MLH1 and PMS2 can be a result of sporadic microsatellite instability (via hypermethylation of the MLH1 promoter region) or due to Lynch syndrome / [place]. Materials will be submitted to the Molecular Pathology Laboratory [id number]) for [facility] promoter hypermethylation testing. These results will be issued in a separate report.      03/05/2021 Initial Diagnosis   Uterine cancer ([place])   03/05/2021 Cancer Staging   Staging form: Corpus Uteri - Carcinoma and Carcinosarcoma, AJCC 8th Edition - Pathologic stage from 03/05/2021: FIGO Stage IVB (rpT3, pN2a, cM1) - Signed by [name], MD on 11/12/2021 Stage prefix: Recurrence   03/11/2021 Imaging   1. Status post interval hysterectomy and oophorectomy. 2. There is a rim enhancing soft tissue mass centered in the vagina, measuring approximately 4.4 x 3.1 cm. This is new, or significantly enlarged compared to prior examination dated 01/05/2021. Findings are concerning for malignant involvement. Correlate with physical examination. Contrast enhanced MRI may be helpful to further detail soft tissue anatomy of the low pelvis if indicated. 3. There is at least one newly enlarged left iliac lymph node measuring up to 1.4 x 1.3 cm, nonspecific although concerning for nodal metastatic disease. 4. Fluid attenuation lymphocele or seroma adjacent to surgical clip at the level right ovarian vein. 5. Status post interval midline laparotomy. Subcutaneous fluid within the inferior aspect of the wound. The presence or absence of infection is not established by CT.     03/23/2021 - 07/09/2021 Chemotherapy   Patient is on Treatment Plan : UTERINE Carboplatin AUC 6 / Paclitaxel q21d  03/23/2021 Procedure   Placement of a subcutaneous power-injectable port device. Catheter tip at the superior cavoatrial junction   07/30/2021 Imaging   1. Large ill-defined partially enhancing and likely  centrally necrotic vaginal and paravaginal mass. Recommend correlation with direct visualization. MRI pelvis without and with contrast may be helpful for further evaluation. 2. No findings suspicious for omental or peritoneal surface disease. No adenopathy.   08/09/2021 - 09/08/2021 Radiation Therapy   Indication for treatment:  Curative       Radiation treatment dates:   08/09/21 through 09/08/21 Site/dose:  IMRT Uterus; Vagina : 41.5 Gy delivered in 23 Fx with 1.80 Gy/Fx (Planned dosage was for 45.00 GY delivered in 25 Fx with 1.80 Gy/Fx, her final 2 treatments (and boost) was canceled secondary to significant radiation reaction)   Beams/energy: 6X   11/11/2021 Imaging   1. 11 mm left upper lobe pulmonary nodule in the apicoposterior segment abuts the posterior pleural margin. In this clinical context the appearance is very concerning for a metastatic lesion although we do not have prior cross-sectional imaging to assess stability. Proximity to the fifth rib and adjacent major fissure might make percutaneous biopsy tricky; PET-CT would also be one way of assessing the metabolic activity/malignant potential of this lesion. This may be a good candidate case for discussion at multidisciplinary cancer conference. 2. Increased adenopathy along the left iliac chain, including an internal iliac node which abuts the left sacral plexus. 3. The lower vaginal and vestibular mass measures about 7.0 by 5.5 by 5.9 cm and has some internal gas which may indicate ulceration along the vestibule/perineum. Poor definition of tissue planes between this process in the base of the urinary bladder as well as the anus. The mass extends along the left anterior obturator internus muscle. 4. Other imaging findings of potential clinical significance: Prominent main pulmonary artery, cannot exclude pulmonary arterial hypertension. Aortic Atherosclerosis (ICD10-I70.0). Lower lumbar spondylosis and degenerative disc disease.    11/19/2021 -  Chemotherapy   Patient is on Treatment Plan : UTERINE Pembrolizumab (200) q21d       PHYSICAL EXAMINATION: ECOG PERFORMANCE STATUS: 2 - Symptomatic, <50% confined to bed  Vitals:   12/24/21 1100  BP: 120/76  Pulse: (!) 108  Resp: 18  Temp: (!) 97.5 F (36.4 C)  SpO2: 100%   Filed Weights   12/24/21 1100  Weight: 143 lb 12.8 oz (65.2 kg)    GENERAL:alert, no distress and comfortable SKIN: skin color, texture, turgor are normal, no rashes or significant lesions EYES: normal, Conjunctiva are pink and non-injected, sclera clear OROPHARYNX:no exudate, no erythema and lips, buccal mucosa, and tongue normal  NECK: supple, thyroid normal size, non-tender, without nodularity LYMPH:  no palpable lymphadenopathy in the cervical, axillary or inguinal LUNGS: clear to auscultation and percussion with normal breathing effort HEART: regular rate & rhythm and no murmurs and no lower extremity edema ABDOMEN:abdomen soft, non-tender and normal bowel sounds.  Rectal examination is performed.  Well-healed skin in the genital area near the rectum.  No fistula.  Increased rectal tone.  Stool palpable in the rectal vault.  No other abnormalities Musculoskeletal:no cyanosis of digits and no clubbing  NEURO: alert & oriented x 3 with fluent speech, no focal motor/sensory deficits  LABORATORY DATA:  I have reviewed the data as listed    Component Value Date/Time   NA 134 (L) 12/10/2021 0857   K 3.7 12/10/2021 0857   CL 97 (L) 12/10/2021 0857   CO2 28 12/10/2021 0857  GLUCOSE 124 (H) 12/10/2021 0857   BUN 14 12/10/2021 0857   CREATININE 0.57 12/10/2021 0857   CALCIUM 9.4 12/10/2021 0857   PROT 7.0 12/10/2021 0857   ALBUMIN 3.1 (L) 12/10/2021 0857   AST 20 12/10/2021 0857   ALT 42 12/10/2021 0857   ALKPHOS 214 (H) 12/10/2021 0857   BILITOT 0.6 12/10/2021 0857   GFRNONAA >60 12/10/2021 0857    No results found for: "SPEP", "UPEP"  Lab Results  Component Value Date   WBC  9.8 12/10/2021   NEUTROABS 7.6 12/10/2021   HGB 7.4 (L) 12/10/2021   HCT 23.6 (L) 12/10/2021   MCV 67.8 (L) 12/10/2021   PLT 541 (H) 12/10/2021      Chemistry      Component Value Date/Time   NA 134 (L) 12/10/2021 0857   K 3.7 12/10/2021 0857   CL 97 (L) 12/10/2021 0857   CO2 28 12/10/2021 0857   BUN 14 12/10/2021 0857   CREATININE 0.57 12/10/2021 0857      Component Value Date/Time   CALCIUM 9.4 12/10/2021 0857   ALKPHOS 214 (H) 12/10/2021 0857   AST 20 12/10/2021 0857   ALT 42 12/10/2021 0857   BILITOT 0.6 12/10/2021 0857

## 2021-12-24 NOTE — Assessment & Plan Note (Signed)
She had recent weight loss due to difficulties with access to food She is gaining weight now Her church members are taking turns delivering food to her house We discussed importance of high-protein intake

## 2021-12-24 NOTE — Assessment & Plan Note (Signed)
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

## 2021-12-30 ENCOUNTER — Telehealth: Payer: Self-pay | Admitting: *Deleted

## 2021-12-30 NOTE — Telephone Encounter (Signed)
For now, continue pain med as needed I will re-evaluate tomorrow

## 2021-12-30 NOTE — Telephone Encounter (Signed)
Patient called. She has appt with Dr. Alvy Bimler tomorrow and wants her to have this information in advance of app:  Rectal pressure is worse. Having regular BMs without pain/difficulty and able to urinate without pain/difficulty.  Using rolling chair or walker for mobility at home as pain is much worse when walking.  Unable to sit for any length of time. Lying down is most comfortable. Sometimes feels rectal pressure when turning over while lying down.   Message routed to MD as  Juluis Rainier

## 2021-12-31 ENCOUNTER — Inpatient Hospital Stay (HOSPITAL_BASED_OUTPATIENT_CLINIC_OR_DEPARTMENT_OTHER): Payer: Medicare PPO | Admitting: Hematology and Oncology

## 2021-12-31 ENCOUNTER — Other Ambulatory Visit: Payer: Self-pay

## 2021-12-31 ENCOUNTER — Encounter: Payer: Self-pay | Admitting: Hematology and Oncology

## 2021-12-31 ENCOUNTER — Inpatient Hospital Stay: Payer: Medicare PPO

## 2021-12-31 ENCOUNTER — Other Ambulatory Visit (HOSPITAL_COMMUNITY): Payer: Self-pay

## 2021-12-31 ENCOUNTER — Other Ambulatory Visit: Payer: Self-pay | Admitting: *Deleted

## 2021-12-31 VITALS — BP 103/56 | HR 105 | Temp 98.8°F | Resp 16

## 2021-12-31 VITALS — BP 119/60 | HR 112 | Temp 98.9°F | Resp 18 | Ht 64.0 in | Wt 144.0 lb

## 2021-12-31 DIAGNOSIS — G893 Neoplasm related pain (acute) (chronic): Secondary | ICD-10-CM

## 2021-12-31 DIAGNOSIS — Z5112 Encounter for antineoplastic immunotherapy: Secondary | ICD-10-CM | POA: Diagnosis not present

## 2021-12-31 DIAGNOSIS — C55 Malignant neoplasm of uterus, part unspecified: Secondary | ICD-10-CM

## 2021-12-31 DIAGNOSIS — Z90710 Acquired absence of both cervix and uterus: Secondary | ICD-10-CM | POA: Diagnosis not present

## 2021-12-31 DIAGNOSIS — D539 Nutritional anemia, unspecified: Secondary | ICD-10-CM | POA: Diagnosis not present

## 2021-12-31 DIAGNOSIS — Z90721 Acquired absence of ovaries, unilateral: Secondary | ICD-10-CM | POA: Diagnosis not present

## 2021-12-31 DIAGNOSIS — E46 Unspecified protein-calorie malnutrition: Secondary | ICD-10-CM | POA: Diagnosis not present

## 2021-12-31 DIAGNOSIS — Z923 Personal history of irradiation: Secondary | ICD-10-CM | POA: Diagnosis not present

## 2021-12-31 DIAGNOSIS — R198 Other specified symptoms and signs involving the digestive system and abdomen: Secondary | ICD-10-CM

## 2021-12-31 DIAGNOSIS — Z9079 Acquired absence of other genital organ(s): Secondary | ICD-10-CM | POA: Diagnosis not present

## 2021-12-31 LAB — CBC WITH DIFFERENTIAL (CANCER CENTER ONLY)
Abs Immature Granulocytes: 0.02 10*3/uL (ref 0.00–0.07)
Basophils Absolute: 0 10*3/uL (ref 0.0–0.1)
Basophils Relative: 1 %
Eosinophils Absolute: 0.1 10*3/uL (ref 0.0–0.5)
Eosinophils Relative: 1 %
HCT: 24.8 % — ABNORMAL LOW (ref 36.0–46.0)
Hemoglobin: 7.6 g/dL — ABNORMAL LOW (ref 12.0–15.0)
Immature Granulocytes: 0 %
Lymphocytes Relative: 14 %
Lymphs Abs: 0.9 10*3/uL (ref 0.7–4.0)
MCH: 22.6 pg — ABNORMAL LOW (ref 26.0–34.0)
MCHC: 30.6 g/dL (ref 30.0–36.0)
MCV: 73.8 fL — ABNORMAL LOW (ref 80.0–100.0)
Monocytes Absolute: 0.5 10*3/uL (ref 0.1–1.0)
Monocytes Relative: 7 %
Neutro Abs: 5.1 10*3/uL (ref 1.7–7.7)
Neutrophils Relative %: 77 %
Platelet Count: 434 10*3/uL — ABNORMAL HIGH (ref 150–400)
RBC: 3.36 MIL/uL — ABNORMAL LOW (ref 3.87–5.11)
RDW: 22.5 % — ABNORMAL HIGH (ref 11.5–15.5)
WBC Count: 6.6 10*3/uL (ref 4.0–10.5)
nRBC: 0 % (ref 0.0–0.2)

## 2021-12-31 LAB — SAMPLE TO BLOOD BANK

## 2021-12-31 LAB — CMP (CANCER CENTER ONLY)
ALT: 15 U/L (ref 0–44)
AST: 9 U/L — ABNORMAL LOW (ref 15–41)
Albumin: 3.2 g/dL — ABNORMAL LOW (ref 3.5–5.0)
Alkaline Phosphatase: 151 U/L — ABNORMAL HIGH (ref 38–126)
Anion gap: 7 (ref 5–15)
BUN: 10 mg/dL (ref 8–23)
CO2: 28 mmol/L (ref 22–32)
Calcium: 9.4 mg/dL (ref 8.9–10.3)
Chloride: 103 mmol/L (ref 98–111)
Creatinine: 0.5 mg/dL (ref 0.44–1.00)
GFR, Estimated: >60 mL/min (ref >60)
Glucose, Bld: 130 mg/dL — ABNORMAL HIGH (ref 70–99)
Potassium: 3.3 mmol/L — ABNORMAL LOW (ref 3.5–5.1)
Sodium: 138 mmol/L (ref 135–145)
Total Bilirubin: 0.6 mg/dL (ref 0.3–1.2)
Total Protein: 7.5 g/dL (ref 6.5–8.1)

## 2021-12-31 LAB — TSH: TSH: 0.015 u[IU]/mL — ABNORMAL LOW (ref 0.350–4.500)

## 2021-12-31 LAB — PREPARE RBC (CROSSMATCH)

## 2021-12-31 MED ORDER — MORPHINE SULFATE 30 MG PO TABS
30.0000 mg | ORAL_TABLET | Freq: Four times a day (QID) | ORAL | 0 refills | Status: DC | PRN
  Filled 2021-12-31: qty 90, 23d supply, fill #0

## 2021-12-31 MED ORDER — SODIUM CHLORIDE 0.9% FLUSH
10.0000 mL | Freq: Once | INTRAVENOUS | Status: AC
  Administered 2021-12-31: 10 mL

## 2021-12-31 MED ORDER — SODIUM CHLORIDE 0.9 % IV SOLN: Freq: Once | INTRAVENOUS | Status: AC

## 2021-12-31 MED ORDER — SODIUM CHLORIDE 0.9% FLUSH
10.0000 mL | INTRAVENOUS | Status: DC | PRN
  Administered 2021-12-31: 10 mL

## 2021-12-31 MED ORDER — SODIUM CHLORIDE 0.9% IV SOLUTION
250.0000 mL | Freq: Once | INTRAVENOUS | Status: AC
  Administered 2021-12-31: 250 mL via INTRAVENOUS

## 2021-12-31 MED ORDER — CYANOCOBALAMIN 1000 MCG/ML IJ SOLN
1000.0000 ug | Freq: Once | INTRAMUSCULAR | Status: AC
  Administered 2021-12-31: 1000 ug via INTRAMUSCULAR
  Filled 2021-12-31: qty 1

## 2021-12-31 MED ORDER — MORPHINE SULFATE (PF) 4 MG/ML IV SOLN
4.0000 mg | Freq: Once | INTRAVENOUS | Status: AC
  Administered 2021-12-31: 4 mg via INTRAVENOUS
  Filled 2021-12-31: qty 1

## 2021-12-31 MED ORDER — DIPHENHYDRAMINE HCL 25 MG PO CAPS
25.0000 mg | ORAL_CAPSULE | Freq: Once | ORAL | Status: AC
  Administered 2021-12-31: 25 mg via ORAL
  Filled 2021-12-31: qty 1

## 2021-12-31 MED ORDER — SODIUM CHLORIDE 0.9 % IV SOLN
200.0000 mg | Freq: Once | INTRAVENOUS | Status: AC
  Administered 2021-12-31: 200 mg via INTRAVENOUS
  Filled 2021-12-31: qty 200

## 2021-12-31 MED ORDER — HEPARIN SOD (PORK) LOCK FLUSH 100 UNIT/ML IV SOLN
500.0000 [IU] | Freq: Once | INTRAVENOUS | Status: AC | PRN
  Administered 2021-12-31: 500 [IU]

## 2021-12-31 MED ORDER — ACETAMINOPHEN 325 MG PO TABS
650.0000 mg | ORAL_TABLET | Freq: Once | ORAL | Status: AC
  Administered 2021-12-31: 650 mg via ORAL
  Filled 2021-12-31: qty 2

## 2021-12-31 NOTE — Assessment & Plan Note (Signed)
We discussed narcotic refill policy Due to uncontrolled pain, I plan to increase the dose of morphine sulfate

## 2021-12-31 NOTE — Assessment & Plan Note (Signed)
She has persistent rectal pressure I recommend the patient to take pain medicine as needed I plan to order CT imaging before her next treatment

## 2022-01-01 LAB — T4: T4, Total: 18.3 ug/dL — ABNORMAL HIGH (ref 4.5–12.0)

## 2022-01-04 LAB — TYPE AND SCREEN
ABO/RH(D): O POS
Antibody Screen: NEGATIVE
Unit division: 0
Unit division: 0

## 2022-01-04 LAB — BPAM RBC
Blood Product Expiration Date: 202307212359
Blood Product Expiration Date: 202307212359
ISSUE DATE / TIME: 202306231235
Unit Type and Rh: 5100
Unit Type and Rh: 5100

## 2022-01-19 ENCOUNTER — Other Ambulatory Visit: Payer: Self-pay

## 2022-01-19 ENCOUNTER — Inpatient Hospital Stay: Payer: Medicare PPO | Attending: Gynecologic Oncology

## 2022-01-19 ENCOUNTER — Other Ambulatory Visit: Payer: Self-pay | Admitting: *Deleted

## 2022-01-19 ENCOUNTER — Ambulatory Visit (HOSPITAL_COMMUNITY)
Admission: RE | Admit: 2022-01-19 | Discharge: 2022-01-19 | Disposition: A | Discharge status: Home / Self Care | Payer: Medicare PPO | Source: Ambulatory Visit | Attending: Hematology and Oncology | Admitting: Hematology and Oncology

## 2022-01-19 DIAGNOSIS — C7800 Secondary malignant neoplasm of unspecified lung: Secondary | ICD-10-CM | POA: Insufficient documentation

## 2022-01-19 DIAGNOSIS — C549 Malignant neoplasm of corpus uteri, unspecified: Secondary | ICD-10-CM | POA: Diagnosis not present

## 2022-01-19 DIAGNOSIS — N3289 Other specified disorders of bladder: Secondary | ICD-10-CM | POA: Diagnosis not present

## 2022-01-19 DIAGNOSIS — D539 Nutritional anemia, unspecified: Secondary | ICD-10-CM | POA: Diagnosis not present

## 2022-01-19 DIAGNOSIS — G893 Neoplasm related pain (acute) (chronic): Secondary | ICD-10-CM | POA: Insufficient documentation

## 2022-01-19 DIAGNOSIS — R911 Solitary pulmonary nodule: Secondary | ICD-10-CM | POA: Diagnosis not present

## 2022-01-19 DIAGNOSIS — Z5112 Encounter for antineoplastic immunotherapy: Secondary | ICD-10-CM | POA: Insufficient documentation

## 2022-01-19 DIAGNOSIS — R918 Other nonspecific abnormal finding of lung field: Secondary | ICD-10-CM | POA: Diagnosis not present

## 2022-01-19 DIAGNOSIS — C55 Malignant neoplasm of uterus, part unspecified: Secondary | ICD-10-CM | POA: Diagnosis not present

## 2022-01-19 DIAGNOSIS — C52 Malignant neoplasm of vagina: Secondary | ICD-10-CM | POA: Diagnosis not present

## 2022-01-19 LAB — TSH: TSH: 3.01 u[IU]/mL (ref 0.350–4.500)

## 2022-01-19 LAB — CBC WITH DIFFERENTIAL (CANCER CENTER ONLY)
Abs Immature Granulocytes: 0.04 10*3/uL (ref 0.00–0.07)
Basophils Absolute: 0 10*3/uL (ref 0.0–0.1)
Basophils Relative: 0 %
Eosinophils Absolute: 0.3 10*3/uL (ref 0.0–0.5)
Eosinophils Relative: 3 %
HCT: 24.5 % — ABNORMAL LOW (ref 36.0–46.0)
Hemoglobin: 7.4 g/dL — ABNORMAL LOW (ref 12.0–15.0)
Immature Granulocytes: 1 %
Lymphocytes Relative: 10 %
Lymphs Abs: 0.8 10*3/uL (ref 0.7–4.0)
MCH: 21.4 pg — ABNORMAL LOW (ref 26.0–34.0)
MCHC: 30.2 g/dL (ref 30.0–36.0)
MCV: 71 fL — ABNORMAL LOW (ref 80.0–100.0)
Monocytes Absolute: 0.6 10*3/uL (ref 0.1–1.0)
Monocytes Relative: 7 %
Neutro Abs: 6.3 10*3/uL (ref 1.7–7.7)
Neutrophils Relative %: 79 %
Platelet Count: 664 10*3/uL — ABNORMAL HIGH (ref 150–400)
RBC: 3.45 MIL/uL — ABNORMAL LOW (ref 3.87–5.11)
RDW: 19.6 % — ABNORMAL HIGH (ref 11.5–15.5)
WBC Count: 8 10*3/uL (ref 4.0–10.5)
nRBC: 0 % (ref 0.0–0.2)

## 2022-01-19 LAB — SAMPLE TO BLOOD BANK

## 2022-01-19 LAB — CMP (CANCER CENTER ONLY)
ALT: 8 U/L (ref 0–44)
AST: 9 U/L — ABNORMAL LOW (ref 15–41)
Albumin: 2.9 g/dL — ABNORMAL LOW (ref 3.5–5.0)
Alkaline Phosphatase: 95 U/L (ref 38–126)
Anion gap: 6 (ref 5–15)
BUN: 10 mg/dL (ref 8–23)
CO2: 32 mmol/L (ref 22–32)
Calcium: 9 mg/dL (ref 8.9–10.3)
Chloride: 97 mmol/L — ABNORMAL LOW (ref 98–111)
Creatinine: 0.5 mg/dL (ref 0.44–1.00)
GFR, Estimated: >60 mL/min (ref >60)
Glucose, Bld: 124 mg/dL — ABNORMAL HIGH (ref 70–99)
Potassium: 3.3 mmol/L — ABNORMAL LOW (ref 3.5–5.1)
Sodium: 135 mmol/L (ref 135–145)
Total Bilirubin: 0.4 mg/dL (ref 0.3–1.2)
Total Protein: 7.2 g/dL (ref 6.5–8.1)

## 2022-01-19 LAB — PREPARE RBC (CROSSMATCH)

## 2022-01-19 MED ORDER — SODIUM CHLORIDE 0.9% FLUSH
10.0000 mL | Freq: Once | INTRAVENOUS | Status: AC
  Administered 2022-01-19: 10 mL

## 2022-01-19 MED ORDER — HEPARIN SOD (PORK) LOCK FLUSH 100 UNIT/ML IV SOLN
500.0000 [IU] | Freq: Once | INTRAVENOUS | Status: AC
Start: 2022-01-19 — End: 2022-01-19
  Administered 2022-01-19: 500 [IU] via INTRAVENOUS

## 2022-01-19 MED ORDER — HEPARIN SOD (PORK) LOCK FLUSH 100 UNIT/ML IV SOLN
INTRAVENOUS | Status: AC
  Filled 2022-01-19: qty 5

## 2022-01-19 MED ORDER — IOHEXOL 300 MG/ML  SOLN
100.0000 mL | Freq: Once | INTRAMUSCULAR | Status: AC | PRN
  Administered 2022-01-19: 100 mL via INTRAVENOUS

## 2022-01-19 NOTE — Progress Notes (Signed)
Critical lab value 7.4 hgb. Pt will receive 1 unit prbc's. Blood orders were placed and will be infused on 7/14.

## 2022-01-20 LAB — T4: T4, Total: 6.9 ug/dL (ref 4.5–12.0)

## 2022-01-21 ENCOUNTER — Other Ambulatory Visit: Payer: Self-pay

## 2022-01-21 ENCOUNTER — Encounter: Payer: Self-pay | Admitting: Hematology and Oncology

## 2022-01-21 ENCOUNTER — Inpatient Hospital Stay (HOSPITAL_BASED_OUTPATIENT_CLINIC_OR_DEPARTMENT_OTHER): Payer: Medicare PPO | Admitting: Hematology and Oncology

## 2022-01-21 ENCOUNTER — Inpatient Hospital Stay: Payer: Medicare PPO

## 2022-01-21 VITALS — BP 110/52 | HR 85 | Resp 18 | Ht 64.0 in | Wt 150.0 lb

## 2022-01-21 VITALS — BP 107/64 | HR 77 | Temp 98.0°F | Resp 17

## 2022-01-21 DIAGNOSIS — D539 Nutritional anemia, unspecified: Secondary | ICD-10-CM

## 2022-01-21 DIAGNOSIS — G893 Neoplasm related pain (acute) (chronic): Secondary | ICD-10-CM

## 2022-01-21 DIAGNOSIS — C55 Malignant neoplasm of uterus, part unspecified: Secondary | ICD-10-CM

## 2022-01-21 DIAGNOSIS — Z5112 Encounter for antineoplastic immunotherapy: Secondary | ICD-10-CM | POA: Diagnosis not present

## 2022-01-21 DIAGNOSIS — C7800 Secondary malignant neoplasm of unspecified lung: Secondary | ICD-10-CM | POA: Diagnosis not present

## 2022-01-21 DIAGNOSIS — R911 Solitary pulmonary nodule: Secondary | ICD-10-CM | POA: Diagnosis not present

## 2022-01-21 LAB — BPAM RBC
Blood Product Expiration Date: 202308142359
Unit Type and Rh: 5100

## 2022-01-21 LAB — TYPE AND SCREEN
ABO/RH(D): O POS
Antibody Screen: NEGATIVE
Unit division: 0

## 2022-01-21 LAB — PREPARE RBC (CROSSMATCH)

## 2022-01-21 MED ORDER — SODIUM CHLORIDE 0.9 % IV SOLN
200.0000 mg | Freq: Once | INTRAVENOUS | Status: AC
  Administered 2022-01-21: 200 mg via INTRAVENOUS
  Filled 2022-01-21: qty 200

## 2022-01-21 MED ORDER — DIPHENHYDRAMINE HCL 25 MG PO CAPS
25.0000 mg | ORAL_CAPSULE | Freq: Once | ORAL | Status: AC
  Administered 2022-01-21: 25 mg via ORAL
  Filled 2022-01-21: qty 1

## 2022-01-21 MED ORDER — ACETAMINOPHEN 325 MG PO TABS
650.0000 mg | ORAL_TABLET | Freq: Once | ORAL | Status: AC
  Administered 2022-01-21: 650 mg via ORAL
  Filled 2022-01-21: qty 2

## 2022-01-21 MED ORDER — HEPARIN SOD (PORK) LOCK FLUSH 100 UNIT/ML IV SOLN
500.0000 [IU] | Freq: Once | INTRAVENOUS | Status: AC | PRN
  Administered 2022-01-21: 500 [IU]

## 2022-01-21 MED ORDER — SODIUM CHLORIDE 0.9% FLUSH
10.0000 mL | INTRAVENOUS | Status: DC | PRN
  Administered 2022-01-21: 10 mL

## 2022-01-21 MED ORDER — SODIUM CHLORIDE 0.9 % IV SOLN: Freq: Once | INTRAVENOUS | Status: AC

## 2022-01-21 MED ORDER — SODIUM CHLORIDE 0.9% IV SOLUTION
250.0000 mL | Freq: Once | INTRAVENOUS | Status: AC
  Administered 2022-01-21: 250 mL via INTRAVENOUS

## 2022-01-21 NOTE — Progress Notes (Signed)
[place] OFFICE PROGRESS NOTE  Patient Care Team: Pcp, No as PCP - General Candice Mink [name], RN as Oncology Nurse Navigator (Oncology)  ASSESSMENT & PLAN:  Uterine cancer Candice [facility]) I reviewed test results with the patient Overall, she has mixed response She has overall good disease control in her pelvis However, the lung nodule on the left lung is slightly increased in size She is not symptomatic I recommend we continue on systemic treatment as scheduled I will consult radiation oncologist for palliative radiation therapy to the lung nodule She is in agreement with the plan of care  Cancer associated pain Overall, she has excellent pain control She will continue current prescribed pain medicine  Deficiency anemia We discussed some of the risks, benefits, and alternatives of blood transfusions. The patient is symptomatic from anemia and the hemoglobin level is critically low.  Some of the side-effects to be expected including risks of transfusion reactions, chills, infection, syndrome of volume overload and risk of hospitalization from various reasons and the patient is willing to proceed and went ahead to sign consent today.   Nodule of left lung She is not symptomatic I recommend palliative radiation therapy  Orders Placed This Encounter  Procedures   Informed Consent Details: Physician/Practitioner Attestation; Transcribe to consent form and obtain patient signature    Standing Status:   Future    Number of Occurrences:   1    Standing Expiration Date:   01/22/2023    Order Specific Question:   Physician/Practitioner attestation of informed consent for blood and or blood product transfusion    Answer:   I, the physician/practitioner, attest that I have discussed with the patient the benefits, risks, side effects, alternatives, likelihood of achieving goals and potential problems during recovery for the procedure that I have provided informed consent.    Order Specific  Question:   Product(s)    Answer:   All Product(s)   Care order/instruction    Transfuse Parameters    Standing Status:   Future    Number of Occurrences:   1    Standing Expiration Date:   01/21/2023   Type and screen         Standing Status:   Future    Number of Occurrences:   1    Standing Expiration Date:   01/22/2023    All questions were answered. The patient knows to call the clinic with any problems, questions or concerns. The total time spent in the appointment was 40 minutes encounter with patients including review of chart and various tests results, discussions about plan of care and coordination of care plan   Candice Lark, MD 01/21/2022 12:13 PM  INTERVAL HISTORY: Please see below for problem oriented charting. she returns for treatment follow-up and review of test results She is doing well She is eating better and gaining weight She has less pain She denies recent bleeding  REVIEW OF SYSTEMS:   Constitutional: Denies fevers, chills or abnormal weight loss Eyes: Denies blurriness of vision Ears, nose, mouth, throat, and face: Denies mucositis or sore throat Respiratory: Denies cough, dyspnea or wheezes Cardiovascular: Denies palpitation, chest discomfort or lower extremity swelling Gastrointestinal:  Denies nausea, heartburn or change in bowel habits Skin: Denies abnormal skin rashes Lymphatics: Denies new lymphadenopathy or easy bruising Neurological:Denies numbness, tingling or new weaknesses Behavioral/Psych: Mood is stable, no new changes  All other systems were reviewed with the patient and are negative.  I have reviewed the past medical history, past  surgical history, social history and family history with the patient and they are unchanged from previous note.  ALLERGIES:  has No Known Allergies.  MEDICATIONS:  Current Outpatient Medications  Medication Sig Dispense Refill   acetaminophen (TYLENOL) 325 MG tablet Take 650 mg by mouth as needed.      lidocaine-prilocaine (EMLA) cream Apply to affected area once 30 g 3   magnesium hydroxide (MILK OF MAGNESIA) 400 MG/5ML suspension Take 5 mLs by mouth daily as needed for mild constipation.     morphine (MSIR) 30 MG tablet Take 1 tablet (30 mg total) by mouth every 6 (six) hours as needed for severe pain. 90 tablet 0   ondansetron (ZOFRAN) 8 MG tablet Take 1 tablet (8 mg total) by mouth every 8 (eight) hours as needed. (Patient not taking: Reported on 07/28/2021) 30 tablet 1   polyethylene glycol (MIRALAX / GLYCOLAX) 17 g packet Take 17 g by mouth daily. 30 each 1   prochlorperazine (COMPAZINE) 10 MG tablet Take 1 tablet (10 mg total) by mouth every 6 (six) hours as needed (Nausea or vomiting). (Patient not taking: Reported on 07/28/2021) 30 tablet 1   No current facility-administered medications for this visit.   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   Uterine cancer ([place])  01/05/2021 Imaging   Outside CT imaging  Bilateral hydronephrosis related extrinsic compression on the ureters by the enlarged uterus.   Multiple calcified uterine fibroids although peripherally enhancing diffuse decreased attenuation is noted centrally within the uterus highly suspicious for endometrial neoplasm given the  current history of vaginal bleeding. Further evaluation is recommended. MRI may be helpful. Additionally tissue sampling would likely be helpful.   Hypodensity in the pancreas at the junction of the head and body of the pancreas. This may be related to volume averaging although the possibility of a cystic lesion could not be totally excluded. MRI is recommended for further evaluation.    01/05/2021 Initial Diagnosis   The patient reported at least 1 year of postmenopausal bleeding.  She does not have a primary care provider or gynecologist.  She had known fibroids and had noted that she needed a hysterectomy but had not gotten around to seeking care for this.  She presented to the [facility] emergency department on January 05, 2021 with new onset right lower quadrant pain and this prompted a CT scan of the abdomen and pelvis to be performed on 01/05/2021.     01/14/2021 Pathology Results   FINAL MICROSCOPIC DIAGNOSIS:   A. CERVIX, ENDOCERVIX, BIOPSY:  -  Endometrioid carcinoma, FIGO grade 1  -  See comment    01/26/2021 Imaging   MRI abdomen  1. No suspicious pancreatic mass identified. 2. Moderate to severe bilateral hydroureteronephrosis, similar to recent  CT the abdomen and pelvis, presumably related to extrinsic ureteral compression from the patient's enlarged uterus, as demonstrated on recent CT the abdomen and pelvis 01/05/2021.   02/01/2021 Surgery   Surgery in [facility]  Date of Service: February 01, 2021 3:13 PM  Preoperative Diagnoses: Endometrial cancer  Postoperative Diagnoses: Endometrial cancer  Procedures: Total abdominal hysterectomy, bilateral salpingo-oophorectomy, left pelvic lymphadenectomy, right para-aortic lymphadenectomy  Surgeon: Everitt Amber, MD  Findings: Tumor appreciated in the cervical canal. Large, broad, multi fibroid uterus with a calcified fibroid filling the posterior cul de sac, an anterior lower uterine segment fibroid, and bilateral fibroids near the level of the cardinal  ligament. No evidence of intraperitoneal disease. Normal appearing bilateral tubes and ovaries. Bilateral hydroureter from uterine compression. Tumor excreted from specimen into the pelvis at time of removal. An approximately 2cm enlarged left pelvic lymph node and a 1cm enlarged right aorta caval lymph node were identified.     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) - Right ovary, negative for tumor - Bilateral fallopian tubes, negative for tumor - Multiple leiomyomata (up to 8.5 cm)   B: Lymph node, aortocaval, excision - Metastatic carcinoma involving 1 lymph node (0.4 cm) (1/1)   C: Lymph node, left pelvic, excision -Metastatic carcinoma involving 1 lymph node (0.3 cm) (1/1)   Diagnosis Comment    The majority of this tumor is composed of well differentiated (overall grade 2, but with areas consistent with grade 1) endometrioid adenocarcinoma with squamous and mucinous differentiation (approximately 70%); multifocally there is abrupt transition to an undifferentiated malignancy with a high-grade monotonous noncohesive appearance (best seen in slide A2) consistent with undifferentiated carcinoma.  The overall morphologic and immunophenotypic findings are consistent with a dedifferentiated endometrial carcinoma.   Of note, the carcinoma present in the lymph nodes is well differentiated endometrioid carcinoma and the metastatic deposit in the ovary is undifferentiated carcinoma. Carcinoma is present less than 1 mm from uterine corpus serosa (in the lower uterine segment).  The assessment of the paracervical soft tissue margins is challenging  due to the disrupted nature of the specimen in this area, however tumor appears present less than 1 mm from paracervical soft tissue.  No definite parametrial invasion is identified.   Immunohistochemistry performed at [facility] (with appropriate controls) demonstrates the following immunoprofile in cells of interest: Well differentiated endometrioid carcinoma: CK AE1/AE3: Positive ER: Positive (>90%, strong staining) P53: Favor wild-type (see comment below)   Dedifferentiated carcinoma: CK AE1/AE3: Very rare cells, predominantly negative ER: Negative P53: Small focus of mutant type staining in a background of wild-type staining (see comment below)   P53 staining comment: In the well differentiated carcinoma component the staining pattern is predominantly wild-type although focally there is slightly increased levels of staining noted.  In the dedifferentiated tumor component, most of the tissue exhibits wild-type pattern staining, however there is a distinct small area that exhibits increased/overexpressed mutant pattern p53 staining.    Immunohistochemistry testing for mismatch repair (MMR) proteins is performed on block A2:   MLH1:  Loss of nuclear expression MSH2:  Intact nuclear expression MSH6:  Intact nuclear expression PMS2:  Loss of nuclear expression   Loss of nuclear expression of MLH1 and PMS2 can  be a result of sporadic microsatellite instability (via hypermethylation of the MLH1 promoter region) or due to Lynch syndrome / HNPCC. Materials will be submitted to the Molecular [facility]ology Laboratory [phone]) for [facility] promoter hypermethylation testing. These results will be issued in a separate report.      03/05/2021 Initial Diagnosis   Uterine cancer ([place])   03/05/2021 Cancer Staging   Staging form: Corpus Uteri - Carcinoma and Carcinosarcoma, AJCC 8th Edition - Pathologic stage from 03/05/2021: FIGO Stage IVB (rpT3, pN2a, cM1) - Signed by Candice Lark, MD on 11/12/2021 Stage prefix:  Recurrence   03/11/2021 Imaging   1. Status post interval hysterectomy and oophorectomy. 2. There is a rim enhancing soft tissue mass centered in the vagina, measuring approximately 4.4 x 3.1 cm. This is new, or significantly enlarged compared to prior examination dated 01/05/2021. Findings are concerning for malignant involvement. Correlate with physical examination. Contrast enhanced MRI may be helpful to further detail soft tissue anatomy of the low pelvis if indicated. 3. There is at least one newly enlarged left iliac lymph node measuring up to 1.4 x 1.3 cm, nonspecific although concerning for nodal metastatic disease. 4. Fluid attenuation lymphocele or seroma adjacent to surgical clip at the level right ovarian vein. 5. Status post interval midline laparotomy. Subcutaneous fluid within the inferior aspect of the wound. The presence or absence of infection is not established by CT.     03/23/2021 - 07/09/2021 Chemotherapy   Patient is on Treatment Plan : UTERINE Carboplatin AUC 6 / Paclitaxel q21d     03/23/2021 Procedure   Placement of a subcutaneous power-injectable port device. Catheter tip at the superior cavoatrial junction   07/30/2021 Imaging   1. Large ill-defined partially enhancing and likely centrally necrotic vaginal and paravaginal mass. Recommend correlation with direct visualization. MRI pelvis without and with contrast may be helpful for further evaluation. 2. No findings suspicious for omental or peritoneal surface disease. No adenopathy.   08/09/2021 - 09/08/2021 Radiation Therapy   Indication for treatment:  Curative       Radiation treatment dates:   08/09/21 through 09/08/21 Site/dose:  IMRT Uterus; Vagina : 41.5 Gy delivered in 23 Fx with 1.80 Gy/Fx (Planned dosage was for 45.00 GY delivered in 25 Fx with 1.80 Gy/Fx, her final 2 treatments (and boost) was canceled secondary to significant radiation reaction)   Beams/energy: 6X   11/11/2021 Imaging   1. 11 mm left upper  lobe pulmonary nodule in the apicoposterior segment abuts the posterior pleural margin. In this clinical context the appearance is very concerning for a metastatic lesion although we do not have prior cross-sectional imaging to assess stability. Proximity to the fifth rib and adjacent major fissure might make percutaneous biopsy tricky; PET-CT would also be one way of assessing the metabolic activity/malignant potential of this lesion. This may be a good candidate case for discussion at multidisciplinary cancer conference. 2. Increased adenopathy along the left iliac chain, including an internal iliac node which abuts the left sacral plexus. 3. The lower vaginal and vestibular mass measures about 7.0 by 5.5 by 5.9 cm and has some internal gas which may indicate ulceration along the vestibule/perineum. Poor definition of tissue planes between this process in the base of the urinary bladder as well as the anus. The mass extends along the left anterior obturator internus muscle. 4. Other imaging findings of potential clinical significance: Prominent main pulmonary artery, cannot exclude pulmonary arterial hypertension. Aortic Atherosclerosis (ICD10-I70.0). Lower lumbar spondylosis and degenerative disc disease.  11/19/2021 -  Chemotherapy   Patient is on Treatment Plan : UTERINE Pembrolizumab (200) q21d     01/21/2022 Imaging   1. Status post hysterectomy. Generally diminished size of an ulcerated mass involving the vagina when compared to prior examination, difficult to accurately measure due to poor resolution of adjacent soft tissues. 2. Significant interval decrease in size of left pelvic sidewall and iliac lymphadenopathy. 3. Interval enlargement of a pulmonary nodule of the posterior left upper lobe. 4. Constellation of findings is consistent with mixed response to treatment, with treatment response of primary tumor and nodal metastatic disease but with clear evidence of an enlarging pulmonary  metastasis. 5. New, diffuse urinary bladder wall thickening and adjacent fat stranding, consistent with nonspecific infectious or inflammatory cystitis, most likely related to local radiation therapy.     PHYSICAL EXAMINATION: ECOG PERFORMANCE STATUS: 1 - Symptomatic but completely ambulatory  Vitals:   01/21/22 1003  BP: (!) 110/52  Pulse: 85  Resp: 18  SpO2: 99%   Filed Weights   01/21/22 1003  Weight: 150 lb (68 kg)    GENERAL:alert, no distress and comfortable NEURO: alert & oriented x 3 with fluent speech, no focal motor/sensory deficits  LABORATORY DATA:  I have reviewed the data as listed    Component Value Date/Time   NA 135 01/19/2022 1056   K 3.3 (L) 01/19/2022 1056   CL 97 (L) 01/19/2022 1056   CO2 32 01/19/2022 1056   GLUCOSE 124 (H) 01/19/2022 1056   BUN 10 01/19/2022 1056   CREATININE 0.50 01/19/2022 1056   CALCIUM 9.0 01/19/2022 1056   PROT 7.2 01/19/2022 1056   ALBUMIN 2.9 (L) 01/19/2022 1056   AST 9 (L) 01/19/2022 1056   ALT 8 01/19/2022 1056   ALKPHOS 95 01/19/2022 1056   BILITOT 0.4 01/19/2022 1056   GFRNONAA >60 01/19/2022 1056    No results found for: "SPEP", "UPEP"  Lab Results  Component Value Date   WBC 8.0 01/19/2022   NEUTROABS 6.3 01/19/2022   HGB 7.4 (L) 01/19/2022   HCT 24.5 (L) 01/19/2022   MCV 71.0 (L) 01/19/2022   PLT 664 (H) 01/19/2022      Chemistry      Component Value Date/Time   NA 135 01/19/2022 1056   K 3.3 (L) 01/19/2022 1056   CL 97 (L) 01/19/2022 1056   CO2 32 01/19/2022 1056   BUN 10 01/19/2022 1056   CREATININE 0.50 01/19/2022 1056      Component Value Date/Time   CALCIUM 9.0 01/19/2022 1056   ALKPHOS 95 01/19/2022 1056   AST 9 (L) 01/19/2022 1056   ALT 8 01/19/2022 1056   BILITOT 0.4 01/19/2022 1056       RADIOGRAPHIC STUDIES: I have reviewed multiple imaging studies with the patient I have personally reviewed the radiological images as listed and agreed with the findings in the report. CT CHEST  ABDOMEN PELVIS W CONTRAST  Result Date: 01/20/2022 CLINICAL DATA:  Endometrioid carcinoma of the cervix, adenocarcinoma of the vagina, restaging, assess treatment response, chemotherapy ongoing, XRT complete * Tracking Code: BO * EXAM: CT CHEST, ABDOMEN, AND PELVIS WITH CONTRAST TECHNIQUE: Multidetector CT imaging of the chest, abdomen and pelvis was performed following the standard protocol during bolus administration of intravenous contrast. RADIATION DOSE REDUCTION: This exam was performed according to the departmental dose-optimization program which includes automated exposure control, adjustment of the mA and/or kV according to patient size and/or use of iterative reconstruction technique. CONTRAST:  126m OMNIPAQUE IOHEXOL 300 MG/ML  SOLN, additional oral enteric contrast COMPARISON:  11/10/2021 FINDINGS: CT CHEST FINDINGS Cardiovascular: Right chest port catheter. Scattered aortic atherosclerosis. Normal heart size. No pericardial effusion. Mediastinum/Nodes: No enlarged mediastinal, hilar, or axillary lymph nodes. Thyroid gland, trachea, and esophagus demonstrate no significant findings. Lungs/Pleura: Interval enlargement of a pulmonary nodule of the posterior left upper lobe abutting the posterior pleura and major fissure, measuring 1.4 x 1.3 cm, previously 1.0 x 0.9 cm (series 4, image 44). Additional small, benign calcified nodule of the peripheral left lower lobe (series 4, image 108). No pleural effusion or pneumothorax. Musculoskeletal: No chest wall mass or suspicious osseous lesions identified. CT ABDOMEN PELVIS FINDINGS Hepatobiliary: No solid liver abnormality is seen. No gallstones, gallbladder wall thickening, or biliary dilatation. Pancreas: Unremarkable. No pancreatic ductal dilatation or surrounding inflammatory changes. Spleen: Normal in size without significant abnormality. Adrenals/Urinary Tract: Adrenal glands are unremarkable. Kidneys are normal, without renal calculi, solid lesion, or  hydronephrosis. New, diffuse urinary bladder wall thickening and adjacent fat stranding (series 2, image 111). Stomach/Bowel: Stomach is within normal limits. Resection and reanastomosis of the mid small bowel in the left hemiabdomen (series 2, image 79). Appendix appears normal. No evidence of bowel wall thickening, distention, or inflammatory changes. Vascular/Lymphatic: Scattered aortic atherosclerosis. Significant interval decrease in size of left pelvic sidewall and iliac lymphadenopathy, lymph node at the left iliac bifurcation measuring 0.8 x 0.7 cm, previously 2.3 x 1.8 cm (series 2, image 93), left sacral lymph node measuring 1.4 x 1.3 cm, previously 2.2 x 1.9 cm (series 2, image 101). Reproductive: Status post hysterectomy. Generally diminished size of an ulcerated mass involving the vagina when compared to prior examination, this is difficult to accurately measure due to poor resolution of adjacent soft tissues but approximately 6.2 x 3.8 cm, previously 6.6 x 5.4 cm when measured similarly (series 2, image 115). Other: No abdominal wall hernia or abnormality. Superficial pubic soft tissue stranding, most likely related to prior surgery and radiation (series 2, image 111). No ascites. Musculoskeletal: No acute osseous findings. IMPRESSION: 1. Status post hysterectomy. Generally diminished size of an ulcerated mass involving the vagina when compared to prior examination, difficult to accurately measure due to poor resolution of adjacent soft tissues. 2. Significant interval decrease in size of left pelvic sidewall and iliac lymphadenopathy. 3. Interval enlargement of a pulmonary nodule of the posterior left upper lobe. 4. 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 5. New, diffuse urinary bladder wall thickening and adjacent fat stranding,  consistent with nonspecific infectious or inflammatory cystitis, most likely related to local radiation therapy. Aortic Atherosclerosis (ICD10-I70.0). Electronically Signed   By: Delanna Ahmadi M.D.   On: 01/20/2022 16:07

## 2022-01-21 NOTE — Assessment & Plan Note (Signed)
We discussed some of the risks, benefits, and alternatives of blood transfusions. The patient is symptomatic from anemia and the hemoglobin level is critically low.  Some of the side-effects to be expected including risks of transfusion reactions, chills, infection, syndrome of volume overload and risk of hospitalization from various reasons and the patient is willing to proceed and went ahead to sign consent today.  

## 2022-01-21 NOTE — Assessment & Plan Note (Signed)
I reviewed test results with the patient Overall, she has mixed response She has overall good disease control in her pelvis However, the lung nodule on the left lung is slightly increased in size She is not symptomatic I recommend we continue on systemic treatment as scheduled I will consult radiation oncologist for palliative radiation therapy to the lung nodule She is in agreement with the plan of care

## 2022-01-21 NOTE — Progress Notes (Signed)
Ok to treat with hgb 7.4 g/dL per Dr Alvy Bimler. Pt to receive 1U RBCs today

## 2022-01-21 NOTE — Assessment & Plan Note (Signed)
Overall, she has excellent pain control She will continue current prescribed pain medicine

## 2022-01-21 NOTE — Patient Instructions (Signed)
[facility]  Discharge Instructions: Thank you for choosing [facility] to provide your oncology and hematology care.   If you have a lab appointment with the [facility], please go directly to the [facility] and check in at the registration area.   Wear comfortable clothing and clothing appropriate for easy access to any Portacath or PICC line.   We strive to give you quality time with your provider. You may need to reschedule your appointment if you arrive late (15 or more minutes).  Arriving late affects you and other patients whose appointments are after yours.  Also, if you miss three or more appointments without notifying the office, you may be dismissed from the clinic at the provider's discretion.      For prescription refill requests, have your pharmacy contact our office and allow 72 hours for refills to be completed.    Today you received the following chemotherapy and/or immunotherapy agents: Keytruda      To help prevent nausea and vomiting after your treatment, we encourage you to take your nausea medication as directed.  BELOW ARE SYMPTOMS THAT SHOULD BE REPORTED IMMEDIATELY: *FEVER GREATER THAN 100.4 F (38 C) OR HIGHER *CHILLS OR SWEATING *NAUSEA AND VOMITING THAT IS NOT CONTROLLED WITH YOUR NAUSEA MEDICATION *UNUSUAL SHORTNESS OF BREATH *UNUSUAL BRUISING OR BLEEDING *URINARY PROBLEMS (pain or burning when urinating, or frequent urination) *BOWEL PROBLEMS (unusual diarrhea, constipation, pain near the anus) TENDERNESS IN MOUTH AND THROAT WITH OR WITHOUT PRESENCE OF ULCERS (sore throat, sores in mouth, or a toothache) UNUSUAL RASH, SWELLING OR PAIN  UNUSUAL VAGINAL DISCHARGE OR ITCHING   Items with * indicate a potential emergency and should be followed up as soon as possible or go to the Emergency Department if any problems should occur.  Please show the CHEMOTHERAPY ALERT CARD or IMMUNOTHERAPY ALERT CARD at check-in to  the Emergency Department and triage nurse.  Should you have questions after your visit or need to cancel or reschedule your appointment, please contact [facility]  Dept: [phone]  and follow the prompts.  Office hours are 8:00 a.m. to 4:30 p.m. Monday - Friday. Please note that voicemails left after 4:00 p.m. may not be returned until the following business day.  We are closed weekends and major holidays. You have access to a nurse at all times for urgent questions. Please call the main number to the clinic Dept: [phone] and follow the prompts.   For any non-urgent questions, you may also contact your provider using MyChart. We now offer e-Visits for anyone 47 and older to request care online for non-urgent symptoms. For details visit [email or web address].   Also download the MyChart app! Go to the app store, search "MyChart", open the app, select [facility], and log in with your MyChart username and password.  Masks are optional in the cancer centers. If you would like for your care team to wear a mask while they are taking care of you, please let them know. For doctor visits, patients may have with them one support person who is at least 70 years old. At this time, visitors are not allowed in the infusion area.  Blood Transfusion, Adult, Care After This sheet gives you information about how to care for yourself after your procedure. Your doctor may also give you more specific instructions. If you have problems or questions, contact your doctor. What can I expect after the procedure? After the procedure, it is common  to have: Bruising and soreness at the IV site. A headache. Follow these instructions at home: Insertion site care     Follow instructions from your doctor about how to take care of your insertion site. This is where an IV tube was put into your vein. Make sure you: Wash your hands with soap and water before and after you change your  bandage (dressing). If you cannot use soap and water, use hand sanitizer. Change your bandage as told by your doctor. Check your insertion site every day for signs of infection. Check for: Redness, swelling, or pain. Bleeding from the site. Warmth. Pus or a bad smell. General instructions Take over-the-counter and prescription medicines only as told by your doctor. Rest as told by your doctor. Go back to your normal activities as told by your doctor. Keep all follow-up visits as told by your doctor. This is important. Contact a doctor if: You have itching or red, swollen areas of skin (hives). You feel worried or nervous (anxious). You feel weak after doing your normal activities. You have redness, swelling, warmth, or pain around the insertion site. You have blood coming from the insertion site, and the blood does not stop with pressure. You have pus or a bad smell coming from the insertion site. Get help right away if: You have signs of a serious reaction. This may be coming from an allergy or the body's defense system (immune system). Signs include: Trouble breathing or shortness of breath. Swelling of the face or feeling warm (flushed). Fever or chills. Head, chest, or back pain. Dark pee (urine) or blood in the pee. Widespread rash. Fast heartbeat. Feeling dizzy or light-headed. You may receive your blood transfusion in an outpatient setting. If so, you will be told whom to contact to report any reactions. These symptoms may be an emergency. Do not wait to see if the symptoms will go away. Get medical help right away. Call your local emergency services (911 in the [place] Do not drive yourself to the hospital. Summary Bruising and soreness at the IV site are common. Check your insertion site every day for signs of infection. Rest as told by your doctor. Go back to your normal activities as told by your doctor. Get help right away if you have signs of a serious reaction. This  information is not intended to replace advice given to you by your health care provider. Make sure you discuss any questions you have with your health care provider. Document Revised: 10/22/2020 Document Reviewed: 12/20/2018 Elsevier Patient Education  [place].

## 2022-01-21 NOTE — Assessment & Plan Note (Signed)
She is not symptomatic I recommend palliative radiation therapy

## 2022-01-22 LAB — BPAM RBC
Blood Product Expiration Date: 202308142359
ISSUE DATE / TIME: 202307141151
Unit Type and Rh: 5100

## 2022-01-22 LAB — TYPE AND SCREEN
ABO/RH(D): O POS
Antibody Screen: NEGATIVE
Unit division: 0

## 2022-01-25 ENCOUNTER — Telehealth: Payer: Self-pay | Admitting: Radiation Oncology

## 2022-01-25 DIAGNOSIS — C541 Malignant neoplasm of endometrium: Secondary | ICD-10-CM | POA: Diagnosis not present

## 2022-01-25 DIAGNOSIS — C7802 Secondary malignant neoplasm of left lung: Secondary | ICD-10-CM | POA: Diagnosis not present

## 2022-01-25 NOTE — Telephone Encounter (Signed)
7/18 @ 11:25 am called patient's phone # no answer/busy.  Left voicemail on pt's niece phone # for patient to call our office.

## 2022-01-28 NOTE — Progress Notes (Signed)
Thoracic Location of Tumor / Histology: pulmonary nodule of the posterior left upper lobe - enlarging on CT chest from 01/19/22  Tobacco/Marijuana/Snuff/ETOH use: no  Past/Anticipated interventions by cardiothoracic surgery, if any: no  Past/Anticipated interventions by medical oncology, if any: currently receiving Keytruda q 3 weeks  Signs/Symptoms Weight changes, if any: {:18581} Respiratory complaints, if any: {:18581} Hemoptysis, if any: {:18581} Pain issues, if any:  {:18581}  SAFETY ISSUES: Prior radiation? Yes  IMRT Uterus; Vagina : 41.5 Gy delivered in 23 Fx with 1.80 Gy/Fx (Planned dosage was for 45.00 GY delivered in 25 Fx with 1.80 Gy/Fx, her final 2 treatments (and boost) was canceled secondary to significant radiation reaction) 08/09/21 through 09/08/21 Pacemaker/ICD? no  Possible current pregnancy?no Is the patient on methotrexate? {:18581}  Current Complaints / other details:  ***

## 2022-01-30 NOTE — Progress Notes (Addendum)
Radiation Oncology         [phone] ________________________________  Outpatient Re-Consultation  Name: Candice Parks MRN: [id number]  Date: 01/31/2022  DOB: 11/28/1950  CC:Pcp, No  [name], MD   REFERRING PHYSICIAN: [name], MD  DIAGNOSIS: The primary encounter diagnosis was Malignant neoplasm of uterus, unspecified site [facility]). A diagnosis of Nodule of left lung was also pertinent to this visit.  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      Interval Since Last Radiation:  4 months and 23 days     Indication for treatment:  Curative       Radiation treatment dates:   08/09/21 through 09/08/21 Site/dose:  IMRT Uterus; Vagina : 41.5 Gy delivered in 23 Fx with 1.80 Gy/Fx (Planned dosage was for 45.00 GY delivered in 25 Fx with 1.80 Gy/Fx, her final 2 treatments (and boost) was canceled secondary to significant radiation reaction) Beams/energy: 6X  HISTORY OF PRESENT ILLNESS::Candice Parks is a 71 y.o. female who is seen today, courtesy of Dr. Alvy Bimler, for for consideration of palliative radiation therapy in management of a LUL pulmonary nodule . To review from her last visit on 10/25/21, the patient had developed pressure in the lower pelvis over the course of several weeks, with exam findings showing significant persistent disease. We discussed potential radiation options including external beam boost field directed at the residual mass vs brachytherapy. Following our discussion, the patient inquired about other potential treatment options and opted to meet with Dr. Alvy Bimler to discuss possible additional types of chemotherapy or possibly immunotherapy prior to making a definitive decision.  Accordingly, the patient met with Dr. Elson Areas on 11/01/21 for further discussion. For her pain, the  patient was given narcotic medication. To better assess her disease (and prior to considering further treatment options), Dr. Alvy Bimler ordered a CT of the abdomen and pelvis on 11/10/21 which demonstrated: a 11 mm left upper lobe pulmonary nodule in the apicoposterior segment abutting the posterior pleural margin concerning for a metastatic lesion; increased adenopathy along the left iliac chain, including an internal iliac node abutting the left sacral plexus; and the lower vaginal and vestibular mass measuring about 7.0 x 5.5 x 5.9 cm. The mass was seen to extend along the left anterior obturator internus muscle, and with some internal gas which may indicate ulceration along the vestibule/perineum.   Following discussion of the risks and benefits, the patient opted to proceed with further treatment consisting of single agent pembrolizumab Beryle Flock) on 11/19/21.   Follow up CT of the chest, abdomen, and pelvis on 01/19/22 showed a mixed response to treatment, demonstrated by a moderate decrease in size of the ulcerated mass involving the vagina,  a significant interval decrease in size of left pelvic sidewall and iliac lymphadenopathy, but interval enlargement of a pulmonary nodule of the posterior left upper lobe. CT also showed new, diffuse urinary bladder wall thickening and adjacent fat stranding, consistent with nonspecific infectious or inflammatory cystitis, most likely related to local radiation therapy.  Dr. Alvy Bimler has advised proceeding with systemic treatment as scheduled in the mean time. Her pain continues to be well managed with narcotic pain medication.    PAST MEDICAL HISTORY:  Past Medical History:  Diagnosis Date   Anemia    taking iron   History of radiation therapy    uterus/vagina 08/09/2021-09/08/2021  Dr Gery Pray   uterine ca 03/2021    PAST SURGICAL HISTORY: Past Surgical History:  Procedure Laterality Date   ABDOMINAL HYSTERECTOMY     IR IMAGING GUIDED PORT  INSERTION  03/22/2021   WISDOM TOOTH EXTRACTION      FAMILY HISTORY:  Family History  Problem Relation Age of Onset   Ovarian cancer Sister    Colon cancer Neg Hx    Breast cancer Neg Hx    Endometrial cancer Neg Hx    Pancreatic cancer Neg Hx    Prostate cancer Neg Hx     SOCIAL HISTORY:  Social History   Tobacco Use   Smoking status: Never    Passive exposure: Past   Smokeless tobacco: Never  Vaping Use   Vaping Use: Never used  Substance Use Topics   Alcohol use: Yes    Comment: Occasional   Drug use: Never    ALLERGIES: No Known Allergies  MEDICATIONS:  Current Outpatient Medications  Medication Sig Dispense Refill   acetaminophen (TYLENOL) 325 MG tablet Take 650 mg by mouth as needed.     magnesium hydroxide (MILK OF MAGNESIA) 400 MG/5ML suspension Take 5 mLs by mouth daily as needed for mild constipation.     morphine (MSIR) 30 MG tablet Take 1 tablet (30 mg total) by mouth every 6 (six) hours as needed for severe pain. 90 tablet 0   polyethylene glycol (MIRALAX / GLYCOLAX) 17 g packet Take 17 g by mouth daily. 30 each 1   furosemide (LASIX) 20 MG tablet Take 1 tablet (20 mg total) by mouth in the morning. 30 tablet 0   lidocaine-prilocaine (EMLA) cream Apply to affected area once (Patient not taking: Reported on 01/31/2022) 30 g 3   ondansetron (ZOFRAN) 8 MG tablet Take 1 tablet (8 mg total) by mouth every 8 (eight) hours as needed. (Patient not taking: Reported on 07/28/2021) 30 tablet 1   prochlorperazine (COMPAZINE) 10 MG tablet Take 1 tablet (10 mg total) by mouth every 6 (six) hours as needed (Nausea or vomiting). (Patient not taking: Reported on 07/28/2021) 30 tablet 1   No current facility-administered medications for this encounter.    REVIEW OF SYSTEMS:  A 10+ POINT REVIEW OF SYSTEMS WAS OBTAINED including neurology, dermatology, psychiatry, cardiac, respiratory, lymph, extremities, GI, GU, musculoskeletal, constitutional, reproductive, HEENT.  She denies  any pain within the chest area significant cough or hemoptysis.  She denies any pain within the pelvis area.  She has noticed increasing problems with swelling in both lower extremities and will be consulting with Dr. Alvy Bimler management of this issue.   PHYSICAL EXAM:  height is $RemoveB'5\' 4"'TXXvmGJm$  (1.626 m) and weight is 146 lb 4 oz (66.3 kg). Her oral temperature is 98.1 F (36.7 C). Her blood pressure is 120/66 and her pulse is 73. Her respiration is 18 and oxygen saturation is 100%.   General: Alert and oriented, in no acute distress HEENT: Head is normocephalic. Extraocular movements are intact. Oropharynx is clear. Neck: Neck is supple, no palpable cervical or supraclavicular lymphadenopathy. Heart: Regular in rate and rhythm with no murmurs, rubs, or gallops. Chest: Clear to auscultation bilaterally, with no rhonchi, wheezes, or rales. Abdomen: Soft, nontender, nondistended, with no rigidity or guarding. Extremities: No cyanosis or edema. Lymphatics: see Neck Exam Skin: No concerning lesions. Musculoskeletal: symmetric strength and muscle tone throughout. Neurologic: Cranial nerves II through XII are grossly intact. No obvious focalities. Speech is fluent. Coordination is intact. Psychiatric: Judgment and insight are intact. Affect  is appropriate. Tense edema noted throughout both lower extremities without any weepage.  More swelling noted along the left side.  No signs of infection.     ECOG = 1  0 - Asymptomatic (Fully active, able to carry on all predisease activities without restriction)  1 - Symptomatic but completely ambulatory (Restricted in physically strenuous activity but ambulatory and able to carry out work of a light or sedentary nature. For example, light housework, office work)  2 - Symptomatic, <50% in bed during the day (Ambulatory and capable of all self care but unable to carry out any work activities. Up and about more than 50% of waking hours)  3 - Symptomatic, >50% in bed,  but not bedbound (Capable of only limited self-care, confined to bed or chair 50% or more of waking hours)  4 - Bedbound (Completely disabled. Cannot carry on any self-care. Totally confined to bed or chair)  5 - Death   Eustace Pen MM, Creech RH, Tormey DC, et al. [phone]). "Toxicity and response criteria of the [facility]". [place] Oncol. 5 (6): 649-55  LABORATORY DATA:  Lab Results  Component Value Date   WBC 8.0 01/19/2022   HGB 7.4 (L) 01/19/2022   HCT 24.5 (L) 01/19/2022   MCV 71.0 (L) 01/19/2022   PLT 664 (H) 01/19/2022   NEUTROABS 6.3 01/19/2022   Lab Results  Component Value Date   NA 135 01/19/2022   K 3.3 (L) 01/19/2022   CL 97 (L) 01/19/2022   CO2 32 01/19/2022   GLUCOSE 124 (H) 01/19/2022   BUN 10 01/19/2022   CREATININE 0.50 01/19/2022   CALCIUM 9.0 01/19/2022      RADIOGRAPHY: CT CHEST ABDOMEN PELVIS W CONTRAST  Result Date: 01/20/2022 CLINICAL DATA:  Endometrioid carcinoma of the cervix, adenocarcinoma of the vagina, restaging, assess treatment response, chemotherapy ongoing, XRT complete * Tracking Code: BO * EXAM: CT CHEST, ABDOMEN, AND PELVIS WITH CONTRAST TECHNIQUE: Multidetector CT imaging of the chest, abdomen and pelvis was performed following the standard protocol during bolus administration of intravenous contrast. RADIATION DOSE REDUCTION: This exam was performed according to the departmental dose-optimization program which includes automated exposure control, adjustment of the mA and/or kV according to patient size and/or use of iterative reconstruction technique. CONTRAST:  153mL OMNIPAQUE IOHEXOL 300 MG/ML SOLN, additional oral enteric contrast COMPARISON:  11/10/2021 FINDINGS: CT CHEST FINDINGS Cardiovascular: Right chest port catheter. Scattered aortic atherosclerosis. Normal heart size. No pericardial effusion. Mediastinum/Nodes: No enlarged mediastinal, hilar, or axillary lymph nodes. Thyroid gland, trachea, and esophagus  demonstrate no significant findings. Lungs/Pleura: Interval enlargement of a pulmonary nodule of the posterior left upper lobe abutting the posterior pleura and major fissure, measuring 1.4 x 1.3 cm, previously 1.0 x 0.9 cm (series 4, image 44). Additional small, benign calcified nodule of the peripheral left lower lobe (series 4, image 108). No pleural effusion or pneumothorax. Musculoskeletal: No chest wall mass or suspicious osseous lesions identified. CT ABDOMEN PELVIS FINDINGS Hepatobiliary: No solid liver abnormality is seen. No gallstones, gallbladder wall thickening, or biliary dilatation. Pancreas: Unremarkable. No pancreatic ductal dilatation or surrounding inflammatory changes. Spleen: Normal in size without significant abnormality. Adrenals/Urinary Tract: Adrenal glands are unremarkable. Kidneys are normal, without renal calculi, solid lesion, or hydronephrosis. New, diffuse urinary bladder wall thickening and adjacent fat stranding (series 2, image 111). Stomach/Bowel: Stomach is within normal limits. Resection and reanastomosis of the mid small bowel in the left hemiabdomen (series 2, image 79). Appendix appears normal. No evidence of bowel  wall thickening, distention, or inflammatory changes. Vascular/Lymphatic: Scattered aortic atherosclerosis. Significant interval decrease in size of left pelvic sidewall and iliac lymphadenopathy, lymph node at the left iliac bifurcation measuring 0.8 x 0.7 cm, previously 2.3 x 1.8 cm (series 2, image 93), left sacral lymph node measuring 1.4 x 1.3 cm, previously 2.2 x 1.9 cm (series 2, image 101). Reproductive: Status post hysterectomy. Generally diminished size of an ulcerated mass involving the vagina when compared to prior examination, this is difficult to accurately measure due to poor resolution of adjacent soft tissues but approximately 6.2 x 3.8 cm, previously 6.6 x 5.4 cm when measured similarly (series 2, image 115). Other: No abdominal wall hernia or  abnormality. Superficial pubic soft tissue stranding, most likely related to prior surgery and radiation (series 2, image 111). No ascites. Musculoskeletal: No acute osseous findings. IMPRESSION: 1. Status post hysterectomy. Generally diminished size of an ulcerated mass involving the vagina when compared to prior examination, difficult to accurately measure due to poor resolution of adjacent soft tissues. 2. Significant interval decrease in size of left pelvic sidewall and iliac lymphadenopathy. 3. Interval enlargement of a pulmonary nodule of the posterior left upper lobe. 4. 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 5. New, diffuse urinary bladder wall thickening and adjacent fat stranding, consistent with nonspecific infectious or inflammatory cystitis, most likely related to local radiation therapy. Aortic Atherosclerosis (ICD10-I70.0). Electronically Signed   By: Delanna Ahmadi M.D.   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      She would be a good candidate for stereotactic body radiation therapy directed at her solitary pulmonary nodule in the left upper lobe.  I discussed the general course of SBRT treatments anticipated side effects and potential toxicities.  Patient appears to understand and wishes to proceed with planned course of treatment.   PLAN: She will proceed with SBRT simulation later today.  Anticipate treatment starting late next week.  She will receive between 3 and 5 SBRT treatments directed at the left upper lobe pulmonary nodule.   35 minutes of total time was spent for this patient encounter, including  preparation, face-to-face counseling with the patient and coordination of care, physical exam, and documentation of the encounter.   ------------------------------------------------  Blair Promise, PhD, MD  This document serves as a record of services personally performed by Gery Pray, MD. It was created on his behalf by Roney Mans, a trained medical scribe. The creation of this record is based on the scribe's personal observations and the provider's statements to them. This document has been checked and approved by the attending provider.

## 2022-01-31 ENCOUNTER — Other Ambulatory Visit: Payer: Self-pay

## 2022-01-31 ENCOUNTER — Ambulatory Visit
Admission: RE | Admit: 2022-01-31 | Discharge: 2022-01-31 | Disposition: A | Discharge status: Home / Self Care | Payer: Medicare PPO | Source: Ambulatory Visit | Attending: Radiation Oncology | Admitting: Radiation Oncology

## 2022-01-31 ENCOUNTER — Ambulatory Visit: Payer: Medicare PPO

## 2022-01-31 VITALS — BP 120/66 | HR 73 | Temp 98.1°F | Resp 18 | Ht 64.0 in | Wt 146.2 lb

## 2022-01-31 DIAGNOSIS — Z79899 Other long term (current) drug therapy: Secondary | ICD-10-CM | POA: Insufficient documentation

## 2022-01-31 DIAGNOSIS — Z8041 Family history of malignant neoplasm of ovary: Secondary | ICD-10-CM | POA: Insufficient documentation

## 2022-01-31 DIAGNOSIS — R2232 Localized swelling, mass and lump, left upper limb: Secondary | ICD-10-CM | POA: Insufficient documentation

## 2022-01-31 DIAGNOSIS — C55 Malignant neoplasm of uterus, part unspecified: Secondary | ICD-10-CM

## 2022-01-31 DIAGNOSIS — Z510 Encounter for antineoplastic radiation therapy: Secondary | ICD-10-CM | POA: Insufficient documentation

## 2022-01-31 DIAGNOSIS — Z923 Personal history of irradiation: Secondary | ICD-10-CM | POA: Diagnosis not present

## 2022-01-31 DIAGNOSIS — R911 Solitary pulmonary nodule: Secondary | ICD-10-CM

## 2022-01-31 DIAGNOSIS — R918 Other nonspecific abnormal finding of lung field: Secondary | ICD-10-CM | POA: Diagnosis not present

## 2022-01-31 DIAGNOSIS — Z90710 Acquired absence of both cervix and uterus: Secondary | ICD-10-CM | POA: Diagnosis not present

## 2022-01-31 DIAGNOSIS — C541 Malignant neoplasm of endometrium: Secondary | ICD-10-CM | POA: Diagnosis not present

## 2022-01-31 DIAGNOSIS — Z90722 Acquired absence of ovaries, bilateral: Secondary | ICD-10-CM | POA: Diagnosis not present

## 2022-01-31 DIAGNOSIS — C7802 Secondary malignant neoplasm of left lung: Secondary | ICD-10-CM | POA: Diagnosis not present

## 2022-01-31 DIAGNOSIS — D649 Anemia, unspecified: Secondary | ICD-10-CM | POA: Insufficient documentation

## 2022-01-31 MED ORDER — FUROSEMIDE 20 MG PO TABS: 20.0000 mg | ORAL_TABLET | Freq: Every morning | ORAL | 0 refills | Status: DC

## 2022-02-01 ENCOUNTER — Other Ambulatory Visit: Payer: Self-pay

## 2022-02-03 ENCOUNTER — Telehealth: Payer: Self-pay | Admitting: Oncology

## 2022-02-03 NOTE — Telephone Encounter (Signed)
Called Candice Parks regarding her request for a power lift chair and wheelchair.  Advised we have called multiple medical supply companies and they do not carry lift chairs.  They recommended for her to purchase one herself and then be reimbursed by her insurance.    Discussed that her insurance may not cover the entire cost - they only reimburse for the motor and some of the tax.  Candice Parks verbalized agreement and understanding.  She is going to find a medical supply company near her to find a wheel chair.  Advised her to let me know which medical supply it is so that we can send in a prescription.  She also said her leg swelling is better with taking the Lasix.  Her legs are not as tight.

## 2022-02-04 ENCOUNTER — Other Ambulatory Visit: Payer: Self-pay

## 2022-02-08 DIAGNOSIS — D539 Nutritional anemia, unspecified: Secondary | ICD-10-CM | POA: Diagnosis not present

## 2022-02-08 DIAGNOSIS — G893 Neoplasm related pain (acute) (chronic): Secondary | ICD-10-CM | POA: Insufficient documentation

## 2022-02-08 DIAGNOSIS — C7802 Secondary malignant neoplasm of left lung: Secondary | ICD-10-CM | POA: Insufficient documentation

## 2022-02-08 DIAGNOSIS — Z510 Encounter for antineoplastic radiation therapy: Secondary | ICD-10-CM | POA: Diagnosis not present

## 2022-02-08 DIAGNOSIS — R911 Solitary pulmonary nodule: Secondary | ICD-10-CM | POA: Insufficient documentation

## 2022-02-08 DIAGNOSIS — Z5112 Encounter for antineoplastic immunotherapy: Secondary | ICD-10-CM | POA: Insufficient documentation

## 2022-02-08 DIAGNOSIS — C541 Malignant neoplasm of endometrium: Secondary | ICD-10-CM | POA: Diagnosis not present

## 2022-02-08 DIAGNOSIS — C549 Malignant neoplasm of corpus uteri, unspecified: Secondary | ICD-10-CM | POA: Insufficient documentation

## 2022-02-09 ENCOUNTER — Other Ambulatory Visit: Payer: Self-pay

## 2022-02-09 ENCOUNTER — Ambulatory Visit
Admission: RE | Admit: 2022-02-09 | Discharge: 2022-02-09 | Disposition: A | Discharge status: Home / Self Care | Payer: Medicare PPO | Source: Ambulatory Visit | Attending: Radiation Oncology | Admitting: Radiation Oncology

## 2022-02-09 DIAGNOSIS — C549 Malignant neoplasm of corpus uteri, unspecified: Secondary | ICD-10-CM | POA: Diagnosis not present

## 2022-02-09 DIAGNOSIS — R911 Solitary pulmonary nodule: Secondary | ICD-10-CM

## 2022-02-09 DIAGNOSIS — G893 Neoplasm related pain (acute) (chronic): Secondary | ICD-10-CM | POA: Diagnosis not present

## 2022-02-09 DIAGNOSIS — C541 Malignant neoplasm of endometrium: Secondary | ICD-10-CM | POA: Diagnosis not present

## 2022-02-09 DIAGNOSIS — C7802 Secondary malignant neoplasm of left lung: Secondary | ICD-10-CM | POA: Diagnosis not present

## 2022-02-09 DIAGNOSIS — D539 Nutritional anemia, unspecified: Secondary | ICD-10-CM | POA: Diagnosis not present

## 2022-02-09 DIAGNOSIS — Z5112 Encounter for antineoplastic immunotherapy: Secondary | ICD-10-CM | POA: Diagnosis not present

## 2022-02-09 DIAGNOSIS — Z510 Encounter for antineoplastic radiation therapy: Secondary | ICD-10-CM | POA: Diagnosis not present

## 2022-02-09 LAB — RAD ONC ARIA SESSION SUMMARY
Course Elapsed Days: 0
Plan Fractions Treated to Date: 1
Plan Prescribed Dose Per Fraction: 10 Gy
Plan Total Fractions Prescribed: 5
Plan Total Prescribed Dose: 50 Gy
Reference Point Dosage Given to Date: 10 Gy
Reference Point Session Dosage Given: 10 Gy
Session Number: 1

## 2022-02-10 ENCOUNTER — Ambulatory Visit: Payer: Medicare PPO | Admitting: Radiation Oncology

## 2022-02-11 ENCOUNTER — Other Ambulatory Visit: Payer: Self-pay

## 2022-02-11 ENCOUNTER — Ambulatory Visit
Admission: RE | Admit: 2022-02-11 | Discharge: 2022-02-11 | Disposition: A | Discharge status: Home / Self Care | Payer: Medicare PPO | Source: Ambulatory Visit | Attending: Radiation Oncology | Admitting: Radiation Oncology

## 2022-02-11 DIAGNOSIS — D539 Nutritional anemia, unspecified: Secondary | ICD-10-CM | POA: Diagnosis not present

## 2022-02-11 DIAGNOSIS — Z5112 Encounter for antineoplastic immunotherapy: Secondary | ICD-10-CM | POA: Diagnosis not present

## 2022-02-11 DIAGNOSIS — G893 Neoplasm related pain (acute) (chronic): Secondary | ICD-10-CM | POA: Diagnosis not present

## 2022-02-11 DIAGNOSIS — Z510 Encounter for antineoplastic radiation therapy: Secondary | ICD-10-CM | POA: Diagnosis not present

## 2022-02-11 DIAGNOSIS — C7802 Secondary malignant neoplasm of left lung: Secondary | ICD-10-CM | POA: Diagnosis not present

## 2022-02-11 DIAGNOSIS — C549 Malignant neoplasm of corpus uteri, unspecified: Secondary | ICD-10-CM | POA: Diagnosis not present

## 2022-02-11 LAB — RAD ONC ARIA SESSION SUMMARY
Course Elapsed Days: 2
Plan Fractions Treated to Date: 2
Plan Prescribed Dose Per Fraction: 10 Gy
Plan Total Fractions Prescribed: 5
Plan Total Prescribed Dose: 50 Gy
Reference Point Dosage Given to Date: 20 Gy
Reference Point Session Dosage Given: 10 Gy
Session Number: 2

## 2022-02-14 ENCOUNTER — Other Ambulatory Visit: Payer: Self-pay

## 2022-02-14 ENCOUNTER — Ambulatory Visit
Admission: RE | Admit: 2022-02-14 | Discharge: 2022-02-14 | Disposition: A | Discharge status: Home / Self Care | Payer: Medicare PPO | Source: Ambulatory Visit | Attending: Radiation Oncology | Admitting: Radiation Oncology

## 2022-02-14 DIAGNOSIS — Z5112 Encounter for antineoplastic immunotherapy: Secondary | ICD-10-CM | POA: Diagnosis not present

## 2022-02-14 DIAGNOSIS — Z510 Encounter for antineoplastic radiation therapy: Secondary | ICD-10-CM | POA: Diagnosis not present

## 2022-02-14 DIAGNOSIS — R911 Solitary pulmonary nodule: Secondary | ICD-10-CM

## 2022-02-14 DIAGNOSIS — C549 Malignant neoplasm of corpus uteri, unspecified: Secondary | ICD-10-CM | POA: Diagnosis not present

## 2022-02-14 DIAGNOSIS — G893 Neoplasm related pain (acute) (chronic): Secondary | ICD-10-CM | POA: Diagnosis not present

## 2022-02-14 DIAGNOSIS — C7802 Secondary malignant neoplasm of left lung: Secondary | ICD-10-CM | POA: Diagnosis not present

## 2022-02-14 DIAGNOSIS — D539 Nutritional anemia, unspecified: Secondary | ICD-10-CM | POA: Diagnosis not present

## 2022-02-14 LAB — RAD ONC ARIA SESSION SUMMARY
Course Elapsed Days: 5
Plan Fractions Treated to Date: 3
Plan Prescribed Dose Per Fraction: 10 Gy
Plan Total Fractions Prescribed: 5
Plan Total Prescribed Dose: 50 Gy
Reference Point Dosage Given to Date: 30 Gy
Reference Point Session Dosage Given: 10 Gy
Session Number: 3

## 2022-02-16 ENCOUNTER — Ambulatory Visit: Payer: Medicare PPO

## 2022-02-16 ENCOUNTER — Ambulatory Visit
Admission: RE | Admit: 2022-02-16 | Discharge: 2022-02-16 | Disposition: A | Discharge status: Home / Self Care | Payer: Medicare PPO | Source: Ambulatory Visit | Attending: Radiation Oncology | Admitting: Radiation Oncology

## 2022-02-16 ENCOUNTER — Other Ambulatory Visit: Payer: Self-pay

## 2022-02-16 DIAGNOSIS — C549 Malignant neoplasm of corpus uteri, unspecified: Secondary | ICD-10-CM | POA: Diagnosis not present

## 2022-02-16 DIAGNOSIS — D539 Nutritional anemia, unspecified: Secondary | ICD-10-CM | POA: Diagnosis not present

## 2022-02-16 DIAGNOSIS — C7802 Secondary malignant neoplasm of left lung: Secondary | ICD-10-CM | POA: Diagnosis not present

## 2022-02-16 DIAGNOSIS — R911 Solitary pulmonary nodule: Secondary | ICD-10-CM

## 2022-02-16 DIAGNOSIS — Z510 Encounter for antineoplastic radiation therapy: Secondary | ICD-10-CM | POA: Diagnosis not present

## 2022-02-16 DIAGNOSIS — G893 Neoplasm related pain (acute) (chronic): Secondary | ICD-10-CM | POA: Diagnosis not present

## 2022-02-16 DIAGNOSIS — Z5112 Encounter for antineoplastic immunotherapy: Secondary | ICD-10-CM | POA: Diagnosis not present

## 2022-02-16 LAB — RAD ONC ARIA SESSION SUMMARY
Course Elapsed Days: 7
Plan Fractions Treated to Date: 4
Plan Prescribed Dose Per Fraction: 10 Gy
Plan Total Fractions Prescribed: 5
Plan Total Prescribed Dose: 50 Gy
Reference Point Dosage Given to Date: 40 Gy
Reference Point Session Dosage Given: 10 Gy
Session Number: 4

## 2022-02-17 ENCOUNTER — Other Ambulatory Visit: Payer: Self-pay

## 2022-02-18 ENCOUNTER — Ambulatory Visit
Admission: RE | Admit: 2022-02-18 | Discharge: 2022-02-18 | Disposition: A | Discharge status: Home / Self Care | Payer: Medicare PPO | Source: Ambulatory Visit | Attending: Radiation Oncology | Admitting: Radiation Oncology

## 2022-02-18 ENCOUNTER — Inpatient Hospital Stay: Payer: Medicare PPO

## 2022-02-18 ENCOUNTER — Other Ambulatory Visit: Payer: Self-pay

## 2022-02-18 ENCOUNTER — Encounter: Payer: Self-pay | Admitting: Hematology and Oncology

## 2022-02-18 ENCOUNTER — Inpatient Hospital Stay (HOSPITAL_BASED_OUTPATIENT_CLINIC_OR_DEPARTMENT_OTHER): Payer: Medicare PPO | Admitting: Hematology and Oncology

## 2022-02-18 ENCOUNTER — Other Ambulatory Visit: Payer: Medicare PPO

## 2022-02-18 ENCOUNTER — Other Ambulatory Visit (HOSPITAL_COMMUNITY): Payer: Self-pay

## 2022-02-18 VITALS — BP 93/76 | HR 58 | Resp 18

## 2022-02-18 DIAGNOSIS — C55 Malignant neoplasm of uterus, part unspecified: Secondary | ICD-10-CM

## 2022-02-18 DIAGNOSIS — D539 Nutritional anemia, unspecified: Secondary | ICD-10-CM

## 2022-02-18 DIAGNOSIS — Z923 Personal history of irradiation: Secondary | ICD-10-CM | POA: Insufficient documentation

## 2022-02-18 DIAGNOSIS — D61818 Other pancytopenia: Secondary | ICD-10-CM | POA: Insufficient documentation

## 2022-02-18 DIAGNOSIS — Z5112 Encounter for antineoplastic immunotherapy: Secondary | ICD-10-CM | POA: Insufficient documentation

## 2022-02-18 DIAGNOSIS — Z510 Encounter for antineoplastic radiation therapy: Secondary | ICD-10-CM | POA: Diagnosis not present

## 2022-02-18 DIAGNOSIS — Z79899 Other long term (current) drug therapy: Secondary | ICD-10-CM | POA: Insufficient documentation

## 2022-02-18 DIAGNOSIS — G893 Neoplasm related pain (acute) (chronic): Secondary | ICD-10-CM | POA: Diagnosis not present

## 2022-02-18 DIAGNOSIS — C549 Malignant neoplasm of corpus uteri, unspecified: Secondary | ICD-10-CM | POA: Diagnosis not present

## 2022-02-18 DIAGNOSIS — C7802 Secondary malignant neoplasm of left lung: Secondary | ICD-10-CM | POA: Diagnosis not present

## 2022-02-18 DIAGNOSIS — C541 Malignant neoplasm of endometrium: Secondary | ICD-10-CM | POA: Diagnosis not present

## 2022-02-18 LAB — CBC WITH DIFFERENTIAL (CANCER CENTER ONLY)
Abs Immature Granulocytes: 0.01 10*3/uL (ref 0.00–0.07)
Basophils Absolute: 0 10*3/uL (ref 0.0–0.1)
Basophils Relative: 1 %
Eosinophils Absolute: 0.1 10*3/uL (ref 0.0–0.5)
Eosinophils Relative: 2 %
HCT: 29.6 % — ABNORMAL LOW (ref 36.0–46.0)
Hemoglobin: 9.3 g/dL — ABNORMAL LOW (ref 12.0–15.0)
Immature Granulocytes: 0 %
Lymphocytes Relative: 18 %
Lymphs Abs: 0.9 10*3/uL (ref 0.7–4.0)
MCH: 23 pg — ABNORMAL LOW (ref 26.0–34.0)
MCHC: 31.4 g/dL (ref 30.0–36.0)
MCV: 73.3 fL — ABNORMAL LOW (ref 80.0–100.0)
Monocytes Absolute: 0.3 10*3/uL (ref 0.1–1.0)
Monocytes Relative: 5 %
Neutro Abs: 3.5 10*3/uL (ref 1.7–7.7)
Neutrophils Relative %: 74 %
Platelet Count: 266 10*3/uL (ref 150–400)
RBC: 4.04 MIL/uL (ref 3.87–5.11)
RDW: 20.9 % — ABNORMAL HIGH (ref 11.5–15.5)
WBC Count: 4.8 10*3/uL (ref 4.0–10.5)
nRBC: 0 % (ref 0.0–0.2)

## 2022-02-18 LAB — RAD ONC ARIA SESSION SUMMARY
Course Elapsed Days: 9
Plan Fractions Treated to Date: 5
Plan Prescribed Dose Per Fraction: 10 Gy
Plan Total Fractions Prescribed: 5
Plan Total Prescribed Dose: 50 Gy
Reference Point Dosage Given to Date: 50 Gy
Reference Point Session Dosage Given: 10 Gy
Session Number: 5

## 2022-02-18 LAB — CMP (CANCER CENTER ONLY)
ALT: 8 U/L (ref 0–44)
AST: 12 U/L — ABNORMAL LOW (ref 15–41)
Albumin: 3.5 g/dL (ref 3.5–5.0)
Alkaline Phosphatase: 85 U/L (ref 38–126)
Anion gap: 5 (ref 5–15)
BUN: 13 mg/dL (ref 8–23)
CO2: 28 mmol/L (ref 22–32)
Calcium: 8.9 mg/dL (ref 8.9–10.3)
Chloride: 104 mmol/L (ref 98–111)
Creatinine: 0.63 mg/dL (ref 0.44–1.00)
GFR, Estimated: >60 mL/min (ref >60)
Glucose, Bld: 93 mg/dL (ref 70–99)
Potassium: 3.9 mmol/L (ref 3.5–5.1)
Sodium: 137 mmol/L (ref 135–145)
Total Bilirubin: 0.3 mg/dL (ref 0.3–1.2)
Total Protein: 7 g/dL (ref 6.5–8.1)

## 2022-02-18 LAB — SAMPLE TO BLOOD BANK

## 2022-02-18 LAB — TSH: TSH: 48.593 u[IU]/mL — ABNORMAL HIGH (ref 0.350–4.500)

## 2022-02-18 MED ORDER — SODIUM CHLORIDE 0.9% FLUSH
10.0000 mL | Freq: Once | INTRAVENOUS | Status: AC
  Administered 2022-02-18: 10 mL

## 2022-02-18 MED ORDER — SODIUM CHLORIDE 0.9% FLUSH
10.0000 mL | INTRAVENOUS | Status: DC | PRN
  Administered 2022-02-18: 10 mL

## 2022-02-18 MED ORDER — SODIUM CHLORIDE 0.9 % IV SOLN: Freq: Once | INTRAVENOUS | Status: AC

## 2022-02-18 MED ORDER — MORPHINE SULFATE 15 MG PO TABS
15.0000 mg | ORAL_TABLET | Freq: Four times a day (QID) | ORAL | 0 refills | Status: DC | PRN
  Filled 2022-02-18: qty 60, 15d supply, fill #0

## 2022-02-18 MED ORDER — SODIUM CHLORIDE 0.9 % IV SOLN
200.0000 mg | Freq: Once | INTRAVENOUS | Status: AC
  Administered 2022-02-18: 200 mg via INTRAVENOUS
  Filled 2022-02-18: qty 200

## 2022-02-18 MED ORDER — HEPARIN SOD (PORK) LOCK FLUSH 100 UNIT/ML IV SOLN
500.0000 [IU] | Freq: Once | INTRAVENOUS | Status: AC | PRN
  Administered 2022-02-18: 500 [IU]

## 2022-02-18 NOTE — Patient Instructions (Signed)
[facility]  Discharge Instructions: Thank you for choosing [facility] to provide your oncology and hematology care.   If you have a lab appointment with the Cancer Center, please go directly to the Cancer Center and check in at the registration area.   Wear comfortable clothing and clothing appropriate for easy access to any Portacath or PICC line.   We strive to give you quality time with your provider. You may need to reschedule your appointment if you arrive late (15 or more minutes).  Arriving late affects you and other patients whose appointments are after yours.  Also, if you miss three or more appointments without notifying the office, you may be dismissed from the clinic at the provider's discretion.      For prescription refill requests, have your pharmacy contact our office and allow 72 hours for refills to be completed.    Today you received the following chemotherapy and/or immunotherapy agents: Keytruda.       To help prevent nausea and vomiting after your treatment, we encourage you to take your nausea medication as directed.  BELOW ARE SYMPTOMS THAT SHOULD BE REPORTED IMMEDIATELY: *FEVER GREATER THAN 100.4 F (38 C) OR HIGHER *CHILLS OR SWEATING *NAUSEA AND VOMITING THAT IS NOT CONTROLLED WITH YOUR NAUSEA MEDICATION *UNUSUAL SHORTNESS OF BREATH *UNUSUAL BRUISING OR BLEEDING *URINARY PROBLEMS (pain or burning when urinating, or frequent urination) *BOWEL PROBLEMS (unusual diarrhea, constipation, pain near the anus) TENDERNESS IN MOUTH AND THROAT WITH OR WITHOUT PRESENCE OF ULCERS (sore throat, sores in mouth, or a toothache) UNUSUAL RASH, SWELLING OR PAIN  UNUSUAL VAGINAL DISCHARGE OR ITCHING   Items with * indicate a potential emergency and should be followed up as soon as possible or go to the Emergency Department if any problems should occur.  Please show the CHEMOTHERAPY ALERT CARD or IMMUNOTHERAPY ALERT CARD at check-in to  the Emergency Department and triage nurse.  Should you have questions after your visit or need to cancel or reschedule your appointment, please contact [facility]  Dept: [phone]  and follow the prompts.  Office hours are 8:00 a.m. to 4:30 p.m. Monday - Friday. Please note that voicemails left after 4:00 p.m. may not be returned until the following business day.  We are closed weekends and major holidays. You have access to a nurse at all times for urgent questions. Please call the main number to the clinic Dept: [phone] and follow the prompts.   For any non-urgent questions, you may also contact your provider using MyChart. We now offer e-Visits for anyone 18 and older to request care online for non-urgent symptoms. For details visit [email or web address].   Also download the MyChart app! Go to the app store, search "MyChart", open the app, select , and log in with your MyChart username and password.  Masks are optional in the cancer centers. If you would like for your care team to wear a mask while they are taking care of you, please let them know. You may have one support person who is at least 70 years old accompany you for your appointments. 

## 2022-02-18 NOTE — Assessment & Plan Note (Signed)
Clinically, she is doing very well She is improving with weight gain, less pelvic pain and improvement of pancytopenia She just completed radiation treatment I recommend her to continue on pembrolizumab I recommend we wait minimum 6 to 8 weeks before repeating imaging study, due around October

## 2022-02-18 NOTE — Assessment & Plan Note (Signed)
She has multifactorial anemia She does not need transfusion support today She will continue vitamin B12 injection every few months

## 2022-02-18 NOTE — Assessment & Plan Note (Signed)
This is much improved I plan to reduce the dose of morphine sulfate

## 2022-02-18 NOTE — Progress Notes (Signed)
[place] OFFICE PROGRESS NOTE  Patient Care Team: Pcp, No as PCP - General  ASSESSMENT & PLAN:  Uterine cancer ([place]) Clinically, she is doing very well She is improving with weight gain, less pelvic pain and improvement of pancytopenia She just completed radiation treatment I recommend her to continue on pembrolizumab I recommend we wait minimum 6 to 8 weeks before repeating imaging study, due around October  Cancer associated pain This is much improved I plan to reduce the dose of morphine sulfate  Deficiency anemia She has multifactorial anemia She does not need transfusion support today She will continue vitamin B12 injection every few months  No orders of the defined types were placed in this encounter.   All questions were answered. The patient knows to call the clinic with any problems, questions or concerns. The total time spent in the appointment was 20 minutes encounter with patients including review of chart and various tests results, discussions about plan of care and coordination of care plan   [name], MD 02/18/2022 12:28 PM  INTERVAL HISTORY: Please see below for problem oriented charting. she returns for treatment follow-up on treatment with pembrolizumab as well as radiation treatment She is doing very well She has gained a lot of weight due to improve appetite She denies pelvic pain or discharge She is still taking a little bit of pain medicine at nighttime No recent bleeding  REVIEW OF SYSTEMS:   Constitutional: Denies fevers, chills or abnormal weight loss Eyes: Denies blurriness of vision Ears, nose, mouth, throat, and face: Denies mucositis or sore throat Respiratory: Denies cough, dyspnea or wheezes Cardiovascular: Denies palpitation, chest discomfort or lower extremity swelling Gastrointestinal:  Denies nausea, heartburn or change in bowel habits Skin: Denies abnormal skin rashes Lymphatics: Denies new lymphadenopathy or easy  bruising Neurological:Denies numbness, tingling or new weaknesses Behavioral/Psych: Mood is stable, no new changes  All other systems were reviewed with the patient and are negative.  I have reviewed the past medical history, past surgical history, social history and family history with the patient and they are unchanged from previous note.  ALLERGIES:  has No Known Allergies.  MEDICATIONS:  Current Outpatient Medications  Medication Sig Dispense Refill   acetaminophen (TYLENOL) 325 MG tablet Take 650 mg by mouth as needed.     furosemide (LASIX) 20 MG tablet Take 1 tablet (20 mg total) by mouth in the morning. 30 tablet 0   lidocaine-prilocaine (EMLA) cream Apply to affected area once (Patient not taking: Reported on 01/31/2022) 30 g 3   magnesium hydroxide (MILK OF MAGNESIA) 400 MG/5ML suspension Take 5 mLs by mouth daily as needed for mild constipation.     morphine (MSIR) 15 MG tablet Take 1 tablet by mouth every 6 hours as needed for severe pain. 60 tablet 0   ondansetron (ZOFRAN) 8 MG tablet Take 1 tablet (8 mg total) by mouth every 8 (eight) hours as needed. (Patient not taking: Reported on 07/28/2021) 30 tablet 1   polyethylene glycol (MIRALAX / GLYCOLAX) 17 g packet Take 17 g by mouth daily. 30 each 1   prochlorperazine (COMPAZINE) 10 MG tablet Take 1 tablet (10 mg total) by mouth every 6 (six) hours as needed (Nausea or vomiting). (Patient not taking: Reported on 07/28/2021) 30 tablet 1   No current facility-administered medications for this visit.   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   Uterine cancer ([place])  01/05/2021 Imaging   Outside CT imaging  Bilateral hydronephrosis related extrinsic compression on the ureters by the enlarged uterus.   Multiple calcified uterine fibroids although peripherally enhancing diffuse decreased attenuation is noted centrally within the uterus highly suspicious for endometrial neoplasm given the current history of vaginal bleeding. Further evaluation is recommended. MRI may be helpful. Additionally tissue sampling would likely be helpful.   Hypodensity in the pancreas at the junction of the head and body of the pancreas. This may be related to volume averaging although the possibility of a cystic lesion could not be totally excluded. MRI is recommended for further evaluation.    01/05/2021 Initial Diagnosis   The patient reported at least 1 year of postmenopausal bleeding.  She does not have a primary care provider or gynecologist.  She had known fibroids and had noted that she needed a hysterectomy but had not gotten around to seeking care for this.  She presented to the [facility] emergency department on January 05, 2021 with new onset right lower quadrant pain and this prompted a CT scan of the abdomen and pelvis to be performed on 01/05/2021.     01/14/2021 Pathology Results   FINAL MICROSCOPIC DIAGNOSIS:   A. CERVIX, ENDOCERVIX, BIOPSY:  -  Endometrioid carcinoma, FIGO grade 1  -  See comment    01/26/2021 Imaging   MRI abdomen  1. No suspicious pancreatic mass identified. 2. Moderate to severe bilateral hydroureteronephrosis, similar to recent CT the abdomen and pelvis, presumably related to  extrinsic ureteral compression from the patient's enlarged uterus, as demonstrated on recent CT the abdomen and pelvis 01/05/2021.   02/01/2021 Surgery   Surgery in [facility]  Date of Service: February 01, 2021 3:13 PM  Preoperative Diagnoses: Endometrial cancer  Postoperative Diagnoses: Endometrial cancer  Procedures: Total abdominal hysterectomy, bilateral salpingo-oophorectomy, left pelvic lymphadenectomy, right para-aortic lymphadenectomy  Surgeon: Everitt Amber, MD  Findings: Tumor appreciated in the cervical canal. Large, broad, multi fibroid uterus with a calcified fibroid filling the posterior cul de sac, an anterior lower uterine segment fibroid, and bilateral fibroids near the level of the cardinal ligament. No evidence of intraperitoneal disease. Normal appearing bilateral tubes and ovaries. Bilateral hydroureter from uterine compression. Tumor excreted from specimen into the pelvis at time of removal. An approximately 2cm enlarged left pelvic lymph node and a 1cm enlarged right aorta caval lymph node were identified.     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) - Right ovary, negative for tumor -  Bilateral fallopian tubes, negative for tumor - Multiple leiomyomata (up to 8.5 cm)   B: Lymph node, aortocaval, excision - Metastatic carcinoma involving 1 lymph node (0.4 cm) (1/1)   C: Lymph node, left pelvic, excision -Metastatic carcinoma involving 1 lymph node (0.3 cm) (1/1)   Diagnosis Comment    The majority of this tumor is composed of well differentiated (overall grade 2, but with areas consistent with grade 1) endometrioid  adenocarcinoma with squamous and mucinous differentiation (approximately 70%); multifocally there is abrupt transition to an undifferentiated malignancy with a high-grade monotonous noncohesive appearance (best seen in slide A2) consistent with undifferentiated carcinoma.  The overall morphologic and immunophenotypic findings are consistent with a dedifferentiated endometrial carcinoma.   Of note, the carcinoma present in the lymph nodes is well differentiated endometrioid carcinoma and the metastatic deposit in the ovary is undifferentiated carcinoma. Carcinoma is present less than 1 mm from uterine corpus serosa (in the lower uterine segment).  The assessment of the paracervical soft tissue margins is challenging due to the disrupted nature of the specimen in this area, however tumor appears present less than 1 mm from paracervical soft tissue.  No definite parametrial invasion is identified.   Immunohistochemistry performed at [facility] (with appropriate controls) demonstrates the following immunoprofile in cells of interest: Well differentiated endometrioid carcinoma: CK AE1/AE3: Positive ER: Positive (>90%, strong staining) P53: Favor wild-type (see comment below)   Dedifferentiated carcinoma: CK AE1/AE3: Very rare cells, predominantly negative ER: Negative P53: Small focus of mutant type staining in a background of wild-type staining (see comment below)   P53 staining comment: In the well differentiated carcinoma component the staining pattern is predominantly wild-type although focally there is slightly increased levels of staining noted.  In the dedifferentiated tumor component, most of the tissue exhibits wild-type pattern staining, however there is a distinct small area that exhibits increased/overexpressed mutant pattern p53 staining.    Immunohistochemistry testing for mismatch repair (MMR) proteins is performed on block A2:   MLH1:  Loss of nuclear expression MSH2:  Intact nuclear  expression MSH6:  Intact nuclear expression PMS2:  Loss of nuclear expression   Loss of nuclear expression of MLH1 and PMS2 can be a result of sporadic microsatellite instability (via hypermethylation of the MLH1 promoter region) or due to Lynch syndrome / [place]. Materials will be submitted to the Molecular Pathology Laboratory [phone]) for [facility] promoter hypermethylation testing. These results will be issued in a separate report.      03/05/2021 Initial Diagnosis   Uterine cancer ([place])   03/05/2021 Cancer Staging   Staging form: Corpus Uteri - Carcinoma and Carcinosarcoma, AJCC 8th Edition - Pathologic stage from 03/05/2021: FIGO Stage IVB (rpT3, pN2a, cM1) - Signed by [name], MD on 11/12/2021 Stage prefix: Recurrence   03/11/2021 Imaging   1. Status post interval hysterectomy and oophorectomy. 2. There is a rim enhancing soft tissue mass centered in the vagina, measuring approximately 4.4 x 3.1 cm. This is new, or significantly enlarged compared to prior examination dated 01/05/2021. Findings are concerning for malignant involvement. Correlate with physical examination. Contrast enhanced MRI may be helpful to further detail soft tissue anatomy of the low pelvis if indicated. 3. There is at least one newly enlarged left iliac lymph node measuring up to 1.4 x 1.3 cm, nonspecific although concerning for nodal metastatic disease. 4. Fluid attenuation lymphocele or seroma adjacent to surgical clip at the level right ovarian vein. 5. Status post interval midline laparotomy. Subcutaneous fluid within the inferior aspect of  the wound. The presence or absence of infection is not established by CT.     03/23/2021 - 07/09/2021 Chemotherapy   Patient is on Treatment Plan : UTERINE Carboplatin AUC 6 / Paclitaxel q21d     03/23/2021 Procedure   Placement of a subcutaneous power-injectable port device. Catheter tip at the superior cavoatrial junction   07/30/2021 Imaging   1. Large ill-defined  partially enhancing and likely centrally necrotic vaginal and paravaginal mass. Recommend correlation with direct visualization. MRI pelvis without and with contrast may be helpful for further evaluation. 2. No findings suspicious for omental or peritoneal surface disease. No adenopathy.   08/09/2021 - 09/08/2021 Radiation Therapy   Indication for treatment:  Curative       Radiation treatment dates:   08/09/21 through 09/08/21 Site/dose:  IMRT Uterus; Vagina : 41.5 Gy delivered in 23 Fx with 1.80 Gy/Fx (Planned dosage was for 45.00 GY delivered in 25 Fx with 1.80 Gy/Fx, her final 2 treatments (and boost) was canceled secondary to significant radiation reaction)   Beams/energy: 6X   11/11/2021 Imaging   1. 11 mm left upper lobe pulmonary nodule in the apicoposterior segment abuts the posterior pleural margin. In this clinical context the appearance is very concerning for a metastatic lesion although we do not have prior cross-sectional imaging to assess stability. Proximity to the fifth rib and adjacent major fissure might make percutaneous biopsy tricky; PET-CT would also be one way of assessing the metabolic activity/malignant potential of this lesion. This may be a good candidate case for discussion at multidisciplinary cancer conference. 2. Increased adenopathy along the left iliac chain, including an internal iliac node which abuts the left sacral plexus. 3. The lower vaginal and vestibular mass measures about 7.0 by 5.5 by 5.9 cm and has some internal gas which may indicate ulceration along the vestibule/perineum. Poor definition of tissue planes between this process in the base of the urinary bladder as well as the anus. The mass extends along the left anterior obturator internus muscle. 4. Other imaging findings of potential clinical significance: Prominent main pulmonary artery, cannot exclude pulmonary arterial hypertension. Aortic Atherosclerosis (ICD10-I70.0). Lower lumbar spondylosis and  degenerative disc disease.   11/19/2021 -  Chemotherapy   Patient is on Treatment Plan : UTERINE Pembrolizumab (200) q21d     01/21/2022 Imaging   1. Status post hysterectomy. Generally diminished size of an ulcerated mass involving the vagina when compared to prior examination, difficult to accurately measure due to poor resolution of adjacent soft tissues. 2. Significant interval decrease in size of left pelvic sidewall and iliac lymphadenopathy. 3. Interval enlargement of a pulmonary nodule of the posterior left upper lobe. 4. 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 5. New, diffuse urinary bladder wall thickening and adjacent fat stranding, consistent with nonspecific infectious or inflammatory cystitis, most likely related to local radiation therapy.     PHYSICAL EXAMINATION: ECOG PERFORMANCE STATUS: 1 - Symptomatic but completely ambulatory  Vitals:   02/18/22 1101  BP: 122/67  Pulse: 63  Resp: 18  Temp: (!) 97.4 F (36.3 C)  SpO2: 100%   Filed Weights   02/18/22 1101  Weight: 164 lb 3.2 oz (74.5 kg)    GENERAL:alert, no distress and comfortable NEURO: alert & oriented x 3 with fluent speech, no focal motor/sensory deficits  LABORATORY DATA:  I have reviewed the data as listed  Component Value Date/Time   NA 137 02/18/2022 1037   K 3.9 02/18/2022 1037   CL 104 02/18/2022 1037   CO2 28 02/18/2022 1037   GLUCOSE 93 02/18/2022 1037   BUN 13 02/18/2022 1037   CREATININE 0.63 02/18/2022 1037   CALCIUM 8.9 02/18/2022 1037   PROT 7.0 02/18/2022 1037   ALBUMIN 3.5 02/18/2022 1037   AST 12 (L) 02/18/2022 1037   ALT 8 02/18/2022 1037   ALKPHOS 85 02/18/2022 1037   BILITOT 0.3 02/18/2022 1037   GFRNONAA >60 02/18/2022 1037    No results found for: "SPEP", "UPEP"  Lab Results  Component Value Date    WBC 4.8 02/18/2022   NEUTROABS 3.5 02/18/2022   HGB 9.3 (L) 02/18/2022   HCT 29.6 (L) 02/18/2022   MCV 73.3 (L) 02/18/2022   PLT 266 02/18/2022      Chemistry      Component Value Date/Time   NA 137 02/18/2022 1037   K 3.9 02/18/2022 1037   CL 104 02/18/2022 1037   CO2 28 02/18/2022 1037   BUN 13 02/18/2022 1037   CREATININE 0.63 02/18/2022 1037      Component Value Date/Time   CALCIUM 8.9 02/18/2022 1037   ALKPHOS 85 02/18/2022 1037   AST 12 (L) 02/18/2022 1037   ALT 8 02/18/2022 1037   BILITOT 0.3 02/18/2022 1037       RADIOGRAPHIC STUDIES: I have personally reviewed the radiological images as listed and agreed with the findings in the report. CT CHEST ABDOMEN PELVIS W CONTRAST  Result Date: 01/20/2022 CLINICAL DATA:  Endometrioid carcinoma of the cervix, adenocarcinoma of the vagina, restaging, assess treatment response, chemotherapy ongoing, XRT complete * Tracking Code: BO * EXAM: CT CHEST, ABDOMEN, AND PELVIS WITH CONTRAST TECHNIQUE: Multidetector CT imaging of the chest, abdomen and pelvis was performed following the standard protocol during bolus administration of intravenous contrast. RADIATION DOSE REDUCTION: This exam was performed according to the departmental dose-optimization program which includes automated exposure control, adjustment of the mA and/or kV according to patient size and/or use of iterative reconstruction technique. CONTRAST:  152mL OMNIPAQUE IOHEXOL 300 MG/ML SOLN, additional oral enteric contrast COMPARISON:  11/10/2021 FINDINGS: CT CHEST FINDINGS Cardiovascular: Right chest port catheter. Scattered aortic atherosclerosis. Normal heart size. No pericardial effusion. Mediastinum/Nodes: No enlarged mediastinal, hilar, or axillary lymph nodes. Thyroid gland, trachea, and esophagus demonstrate no significant findings. Lungs/Pleura: Interval enlargement of a pulmonary nodule of the posterior left upper lobe abutting the posterior pleura and major fissure,  measuring 1.4 x 1.3 cm, previously 1.0 x 0.9 cm (series 4, image 44). Additional small, benign calcified nodule of the peripheral left lower lobe (series 4, image 108). No pleural effusion or pneumothorax. Musculoskeletal: No chest wall mass or suspicious osseous lesions identified. CT ABDOMEN PELVIS FINDINGS Hepatobiliary: No solid liver abnormality is seen. No gallstones, gallbladder wall thickening, or biliary dilatation. Pancreas: Unremarkable. No pancreatic ductal dilatation or surrounding inflammatory changes. Spleen: Normal in size without significant abnormality. Adrenals/Urinary Tract: Adrenal glands are unremarkable. Kidneys are normal, without renal calculi, solid lesion, or hydronephrosis. New, diffuse urinary bladder wall thickening and adjacent fat stranding (series 2, image 111). Stomach/Bowel: Stomach is within normal limits. Resection and reanastomosis of the mid small bowel in the left hemiabdomen (series 2, image 79). Appendix appears normal. No evidence of bowel wall thickening, distention, or inflammatory changes. Vascular/Lymphatic: Scattered aortic atherosclerosis. Significant interval decrease in size of left pelvic sidewall and iliac lymphadenopathy, lymph node at the left iliac bifurcation measuring 0.8 x  0.7 cm, previously 2.3 x 1.8 cm (series 2, image 93), left sacral lymph node measuring 1.4 x 1.3 cm, previously 2.2 x 1.9 cm (series 2, image 101). Reproductive: Status post hysterectomy. Generally diminished size of an ulcerated mass involving the vagina when compared to prior examination, this is difficult to accurately measure due to poor resolution of adjacent soft tissues but approximately 6.2 x 3.8 cm, previously 6.6 x 5.4 cm when measured similarly (series 2, image 115). Other: No abdominal wall hernia or abnormality. Superficial pubic soft tissue stranding, most likely related to prior surgery and radiation (series 2, image 111). No ascites. Musculoskeletal: No acute osseous  findings. IMPRESSION: 1. Status post hysterectomy. Generally diminished size of an ulcerated mass involving the vagina when compared to prior examination, difficult to accurately measure due to poor resolution of adjacent soft tissues. 2. Significant interval decrease in size of left pelvic sidewall and iliac lymphadenopathy. 3. Interval enlargement of a pulmonary nodule of the posterior left upper lobe. 4. 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 5. New, diffuse urinary bladder wall thickening and adjacent fat stranding, consistent with nonspecific infectious or inflammatory cystitis, most likely related to local radiation therapy. Aortic Atherosclerosis (ICD10-I70.0). Electronically Signed   By: Delanna Ahmadi M.D.   On: 01/20/2022 16:07

## 2022-02-19 LAB — T4: T4, Total: 0.9 ug/dL — ABNORMAL LOW (ref 4.5–12.0)

## 2022-02-21 ENCOUNTER — Other Ambulatory Visit: Payer: Self-pay

## 2022-02-21 ENCOUNTER — Telehealth: Payer: Self-pay

## 2022-02-21 MED ORDER — LEVOTHYROXINE SODIUM 50 MCG PO TABS: 50.0000 ug | ORAL_TABLET | Freq: Every day | ORAL | 0 refills | Status: DC

## 2022-02-21 NOTE — Telephone Encounter (Signed)
Called and left below message. Ask her to call the office back. Rx sent to her pharmacy in [place], [place].

## 2022-02-21 NOTE — Telephone Encounter (Signed)
-----   Message from [name], MD sent at 02/21/2022  8:37 AM EDT ----- Tell her she needs to start taking synthroid Call in 50 mcg daily for 30 tabs no refills

## 2022-02-22 ENCOUNTER — Other Ambulatory Visit: Payer: Self-pay | Admitting: Hematology and Oncology

## 2022-02-25 ENCOUNTER — Encounter: Payer: Self-pay | Admitting: Hematology and Oncology

## 2022-02-27 ENCOUNTER — Inpatient Hospital Stay (HOSPITAL_COMMUNITY)
Admission: EM | Admit: 2022-02-27 | Discharge: 2022-03-04 | DRG: 603 | Disposition: A | Discharge status: Home / Self Care | Payer: Medicare PPO | Attending: Internal Medicine | Admitting: Internal Medicine

## 2022-02-27 ENCOUNTER — Emergency Department (HOSPITAL_COMMUNITY): Payer: Medicare PPO

## 2022-02-27 ENCOUNTER — Encounter (HOSPITAL_COMMUNITY): Payer: Self-pay | Admitting: Emergency Medicine

## 2022-02-27 DIAGNOSIS — Z90722 Acquired absence of ovaries, bilateral: Secondary | ICD-10-CM

## 2022-02-27 DIAGNOSIS — Z9079 Acquired absence of other genital organ(s): Secondary | ICD-10-CM | POA: Diagnosis not present

## 2022-02-27 DIAGNOSIS — Z6829 Body mass index (BMI) 29.0-29.9, adult: Secondary | ICD-10-CM | POA: Diagnosis not present

## 2022-02-27 DIAGNOSIS — E663 Overweight: Secondary | ICD-10-CM | POA: Diagnosis present

## 2022-02-27 DIAGNOSIS — L0390 Cellulitis, unspecified: Principal | ICD-10-CM

## 2022-02-27 DIAGNOSIS — E876 Hypokalemia: Secondary | ICD-10-CM | POA: Diagnosis present

## 2022-02-27 DIAGNOSIS — L03115 Cellulitis of right lower limb: Principal | ICD-10-CM | POA: Diagnosis present

## 2022-02-27 DIAGNOSIS — Z79890 Hormone replacement therapy (postmenopausal): Secondary | ICD-10-CM | POA: Diagnosis not present

## 2022-02-27 DIAGNOSIS — R0602 Shortness of breath: Secondary | ICD-10-CM | POA: Diagnosis not present

## 2022-02-27 DIAGNOSIS — E039 Hypothyroidism, unspecified: Secondary | ICD-10-CM | POA: Diagnosis present

## 2022-02-27 DIAGNOSIS — D539 Nutritional anemia, unspecified: Secondary | ICD-10-CM | POA: Diagnosis present

## 2022-02-27 DIAGNOSIS — Z7722 Contact with and (suspected) exposure to environmental tobacco smoke (acute) (chronic): Secondary | ICD-10-CM | POA: Diagnosis present

## 2022-02-27 DIAGNOSIS — Z923 Personal history of irradiation: Secondary | ICD-10-CM

## 2022-02-27 DIAGNOSIS — R2689 Other abnormalities of gait and mobility: Secondary | ICD-10-CM | POA: Diagnosis not present

## 2022-02-27 DIAGNOSIS — Z90710 Acquired absence of both cervix and uterus: Secondary | ICD-10-CM | POA: Diagnosis not present

## 2022-02-27 DIAGNOSIS — D509 Iron deficiency anemia, unspecified: Secondary | ICD-10-CM | POA: Diagnosis present

## 2022-02-27 DIAGNOSIS — R600 Localized edema: Secondary | ICD-10-CM | POA: Diagnosis not present

## 2022-02-27 DIAGNOSIS — D519 Vitamin B12 deficiency anemia, unspecified: Secondary | ICD-10-CM | POA: Diagnosis present

## 2022-02-27 DIAGNOSIS — D849 Immunodeficiency, unspecified: Secondary | ICD-10-CM | POA: Diagnosis present

## 2022-02-27 DIAGNOSIS — Z79899 Other long term (current) drug therapy: Secondary | ICD-10-CM

## 2022-02-27 DIAGNOSIS — C55 Malignant neoplasm of uterus, part unspecified: Secondary | ICD-10-CM | POA: Diagnosis present

## 2022-02-27 DIAGNOSIS — R52 Pain, unspecified: Secondary | ICD-10-CM | POA: Diagnosis not present

## 2022-02-27 DIAGNOSIS — M7989 Other specified soft tissue disorders: Secondary | ICD-10-CM | POA: Diagnosis not present

## 2022-02-27 DIAGNOSIS — R079 Chest pain, unspecified: Secondary | ICD-10-CM | POA: Diagnosis not present

## 2022-02-27 LAB — COMPREHENSIVE METABOLIC PANEL
ALT: 12 U/L (ref 0–44)
AST: 15 U/L (ref 15–41)
Albumin: 2.6 g/dL — ABNORMAL LOW (ref 3.5–5.0)
Alkaline Phosphatase: 99 U/L (ref 38–126)
Anion gap: 8 (ref 5–15)
BUN: 8 mg/dL (ref 8–23)
CO2: 26 mmol/L (ref 22–32)
Calcium: 8.4 mg/dL — ABNORMAL LOW (ref 8.9–10.3)
Chloride: 101 mmol/L (ref 98–111)
Creatinine, Ser: 0.85 mg/dL (ref 0.44–1.00)
GFR, Estimated: >60 mL/min (ref >60)
Glucose, Bld: 148 mg/dL — ABNORMAL HIGH (ref 70–99)
Potassium: 3.3 mmol/L — ABNORMAL LOW (ref 3.5–5.1)
Sodium: 135 mmol/L (ref 135–145)
Total Bilirubin: 0.4 mg/dL (ref 0.3–1.2)
Total Protein: 6.7 g/dL (ref 6.5–8.1)

## 2022-02-27 LAB — CBC WITH DIFFERENTIAL/PLATELET
Abs Immature Granulocytes: 0.13 10*3/uL — ABNORMAL HIGH (ref 0.00–0.07)
Basophils Absolute: 0 10*3/uL (ref 0.0–0.1)
Basophils Relative: 0 %
Eosinophils Absolute: 0.1 10*3/uL (ref 0.0–0.5)
Eosinophils Relative: 1 %
HCT: 29.7 % — ABNORMAL LOW (ref 36.0–46.0)
Hemoglobin: 9 g/dL — ABNORMAL LOW (ref 12.0–15.0)
Immature Granulocytes: 1 %
Lymphocytes Relative: 5 %
Lymphs Abs: 0.8 10*3/uL (ref 0.7–4.0)
MCH: 22.6 pg — ABNORMAL LOW (ref 26.0–34.0)
MCHC: 30.3 g/dL (ref 30.0–36.0)
MCV: 74.6 fL — ABNORMAL LOW (ref 80.0–100.0)
Monocytes Absolute: 0.7 10*3/uL (ref 0.1–1.0)
Monocytes Relative: 4 %
Neutro Abs: 14.3 10*3/uL — ABNORMAL HIGH (ref 1.7–7.7)
Neutrophils Relative %: 89 %
Platelets: 324 10*3/uL (ref 150–400)
RBC: 3.98 MIL/uL (ref 3.87–5.11)
RDW: 18.7 % — ABNORMAL HIGH (ref 11.5–15.5)
WBC: 16 10*3/uL — ABNORMAL HIGH (ref 4.0–10.5)
nRBC: 0 % (ref 0.0–0.2)

## 2022-02-27 LAB — LACTIC ACID, PLASMA
Lactic Acid, Venous: 1.6 mmol/L (ref 0.5–1.9)
Lactic Acid, Venous: 0.8 mmol/L (ref 0.5–1.9)

## 2022-02-27 MED ORDER — IOHEXOL 300 MG/ML  SOLN
70.0000 mL | Freq: Once | INTRAMUSCULAR | Status: AC | PRN
  Administered 2022-02-27: 70 mL via INTRAVENOUS

## 2022-02-27 MED ORDER — ACETAMINOPHEN 650 MG RE SUPP: 650.0000 mg | Freq: Four times a day (QID) | RECTAL | Status: DC | PRN

## 2022-02-27 MED ORDER — ACETAMINOPHEN 325 MG PO TABS
650.0000 mg | ORAL_TABLET | Freq: Four times a day (QID) | ORAL | Status: DC | PRN
  Filled 2022-02-27: qty 2

## 2022-02-27 MED ORDER — MORPHINE SULFATE (PF) 2 MG/ML IV SOLN
1.0000 mg | INTRAVENOUS | Status: AC | PRN
  Administered 2022-02-28 – 2022-03-01 (×3): 1 mg via INTRAVENOUS
  Filled 2022-02-27 (×3): qty 1

## 2022-02-27 MED ORDER — ONDANSETRON HCL 4 MG PO TABS: 4.0000 mg | ORAL_TABLET | Freq: Four times a day (QID) | ORAL | Status: DC | PRN

## 2022-02-27 MED ORDER — POTASSIUM CHLORIDE 20 MEQ PO PACK
40.0000 meq | PACK | Freq: Once | ORAL | Status: AC
  Administered 2022-02-28: 40 meq via ORAL
  Filled 2022-02-27: qty 2

## 2022-02-27 MED ORDER — PIPERACILLIN-TAZOBACTAM 3.375 G IVPB 30 MIN
3.3750 g | Freq: Once | INTRAVENOUS | Status: AC
  Administered 2022-02-27: 3.375 g via INTRAVENOUS
  Filled 2022-02-27: qty 50

## 2022-02-27 MED ORDER — VANCOMYCIN HCL 500 MG/100ML IV SOLN
500.0000 mg | INTRAVENOUS | Status: AC
  Administered 2022-02-28: 500 mg via INTRAVENOUS
  Filled 2022-02-27: qty 100

## 2022-02-27 MED ORDER — LEVOTHYROXINE SODIUM 50 MCG PO TABS
50.0000 ug | ORAL_TABLET | Freq: Every day | ORAL | Status: DC
  Administered 2022-02-28 – 2022-03-04 (×5): 50 ug via ORAL
  Filled 2022-02-27 (×6): qty 1

## 2022-02-27 MED ORDER — HYDROCODONE-ACETAMINOPHEN 5-325 MG PO TABS
1.0000 | ORAL_TABLET | ORAL | Status: DC | PRN
  Administered 2022-03-01 – 2022-03-03 (×5): 2 via ORAL
  Filled 2022-02-27 (×5): qty 2

## 2022-02-27 MED ORDER — MORPHINE SULFATE (PF) 4 MG/ML IV SOLN
6.0000 mg | Freq: Once | INTRAVENOUS | Status: AC
  Administered 2022-02-27: 6 mg via INTRAVENOUS
  Filled 2022-02-27: qty 2

## 2022-02-27 MED ORDER — SODIUM CHLORIDE 0.9 % IV SOLN
2.0000 g | Freq: Three times a day (TID) | INTRAVENOUS | Status: DC
  Administered 2022-02-28 – 2022-03-03 (×11): 2 g via INTRAVENOUS
  Filled 2022-02-27 (×11): qty 12.5

## 2022-02-27 MED ORDER — ENOXAPARIN SODIUM 40 MG/0.4ML IJ SOSY
40.0000 mg | PREFILLED_SYRINGE | INTRAMUSCULAR | Status: DC
  Administered 2022-02-28: 40 mg via SUBCUTANEOUS
  Filled 2022-02-27: qty 0.4

## 2022-02-27 MED ORDER — VANCOMYCIN HCL 1250 MG/250ML IV SOLN
1250.0000 mg | INTRAVENOUS | Status: DC
  Administered 2022-02-28 – 2022-03-02 (×3): 1250 mg via INTRAVENOUS
  Filled 2022-02-27 (×4): qty 250

## 2022-02-27 MED ORDER — SODIUM CHLORIDE 0.9 % IV SOLN: INTRAVENOUS | Status: DC

## 2022-02-27 MED ORDER — VANCOMYCIN HCL IN DEXTROSE 1-5 GM/200ML-% IV SOLN
1000.0000 mg | Freq: Once | INTRAVENOUS | Status: AC
  Administered 2022-02-27: 1000 mg via INTRAVENOUS
  Filled 2022-02-27: qty 200

## 2022-02-27 MED ORDER — ONDANSETRON HCL 4 MG/2ML IJ SOLN: 4.0000 mg | Freq: Four times a day (QID) | INTRAMUSCULAR | Status: DC | PRN

## 2022-02-27 NOTE — H&P (Signed)
History and Physical    Candice Parks IDP:[id number] DOB: [birth year] DOA: 02/27/2022  PCP: Pcp, No  Patient coming from: Home  I have personally briefly reviewed patient's old medical records in [place]  Chief Complaint: Right leg swelling and blister  HPI: Candice Parks is a 71 y.o. female with medical history significant for uterine cancer (s/p TAH and b/l SOO) on Keytruda, iron and B12 deficiency anemia, hypothyroidism who presented to the ED for evaluation of right lower extremity swelling.  Patient reports progressive swelling of her right lower extremity for the last 2 weeks.  Around the time the swelling began she noticed what she thought was a mosquito bite on her anterior upper shin area.  Over time she has noticed erythema from above the ankle to two thirds up the shin.  This morning she noticed a small yellow fluid-filled blister halfway up the anterior part of her shin.  This has increased in size since this morning but blister has remained intact.  She has not had any active discharge.  She is having associated pain and somewhat limited mobility due to swelling.  She denies any subjective fevers, chills, diaphoresis, chest pain, dyspnea, abdominal pain, nausea, vomiting.  ED Course  Labs/Imaging on admission: I have personally reviewed following labs and imaging studies.  Initial vitals showed BP 119/66, pulse 85, RR 16, temp 100.3 F, SPO2 98% on room air.  Labs show WBC 16.0, hemoglobin 9.0, platelets 324,000, sodium 135, potassium 3.3, bicarb 26, BUN 8, creatinine 0.5, serum glucose 148, LFTs within normal limits, lactic acid 1.6 > 0.8.  Blood cultures in process.  Portable chest x-ray negative for focal consolidation, edema, effusion.  CT right lower extremity with contrast shows diffuse subcutaneous edema and fat stranding without evidence of abscess.  Blister along the anterior aspect of the mid tibia noted.  Diffuse intramuscular edema without  evidence of abnormal enhancement or subcutaneous air noted.  Patient was given IV vancomycin and Zosyn.  The hospitalist service was consulted to admit for further evaluation and management.  Review of Systems: All systems reviewed and are negative except as documented in history of present illness above.   Past Medical History:  Diagnosis Date   Anemia    taking iron   History of radiation therapy    uterus/vagina 08/09/2021-09/08/2021  Dr Gery Pray   uterine ca 03/2021    Past Surgical History:  Procedure Laterality Date   ABDOMINAL HYSTERECTOMY     IR IMAGING GUIDED PORT INSERTION  03/22/2021   WISDOM TOOTH EXTRACTION      Social History:  reports that she has never smoked. She has been exposed to tobacco smoke. She has never used smokeless tobacco. She reports current alcohol use. She reports that she does not use drugs.  No Known Allergies  Family History  Problem Relation Age of Onset   Ovarian cancer Sister    Colon cancer Neg Hx    Breast cancer Neg Hx    Endometrial cancer Neg Hx    Pancreatic cancer Neg Hx    Prostate cancer Neg Hx      Prior to Admission medications   Medication Sig Start Date End Date Taking? Authorizing Provider  levothyroxine (SYNTHROID) 50 MCG tablet Take 1 tablet (50 mcg total) by mouth daily before breakfast. 02/21/22   [name], MD  acetaminophen (TYLENOL) 325 MG tablet Take 650 mg by mouth as needed. 02/05/21   [provider]  furosemide (LASIX) 20 MG tablet TAKE  1 TABLET (20 MG TOTAL) BY MOUTH IN THE MORNING 02/25/22   [name], MD  lidocaine-prilocaine (EMLA) cream Apply to affected area once Patient not taking: Reported on 01/31/2022 03/05/21   [name], MD  magnesium hydroxide (MILK OF MAGNESIA) 400 MG/5ML suspension Take 5 mLs by mouth daily as needed for mild constipation.    [provider]  morphine (MSIR) 15 MG tablet Take 1 tablet by mouth every 6 hours as needed for severe pain. 02/18/22   [name],  MD  ondansetron (ZOFRAN) 8 MG tablet Take 1 tablet (8 mg total) by mouth every 8 (eight) hours as needed. Patient not taking: Reported on 07/28/2021 03/05/21   [name], MD  polyethylene glycol (MIRALAX / GLYCOLAX) 17 g packet Take 17 g by mouth daily. 11/12/21   [name], MD  prochlorperazine (COMPAZINE) 10 MG tablet Take 1 tablet (10 mg total) by mouth every 6 (six) hours as needed (Nausea or vomiting). Patient not taking: Reported on 07/28/2021 03/05/21   [name], MD    Physical Exam: Vitals:   02/27/22 2101 02/27/22 2152 02/27/22 2200 02/27/22 2230  BP: (!) 102/58 115/64 (!) 106/59 102/62  Pulse: 76 77 76 76  Resp: '17 17 17 17  '$ Temp:  98.6 F (37 C)    TempSrc:  Oral    SpO2: 100% 98% 98% 100%  Weight:      Height:       Constitutional: NAD, calm, comfortable Eyes: EOMI, lids and conjunctivae normal ENMT: Mucous membranes are moist. Posterior pharynx clear of any exudate or lesions.Normal dentition.  Neck: normal, supple, no masses. Respiratory: clear to auscultation bilaterally, no wheezing, no crackles. Normal respiratory effort. No accessory muscle use.  Cardiovascular: Regular rate and rhythm, no murmurs / rubs / gallops. No extremity edema. 2+ pedal pulses. Abdomen: no tenderness, no masses palpated.  Musculoskeletal: Swollen right lower extremity.  No clubbing / cyanosis. Good ROM, no contractures. Normal muscle tone.  Skin: Swollen RLE with erythema from ankle about two thirds up to the knee, yellow fluid-filled blistering anterior shin and abrasion just below the knee as pictured below Neurologic: Sensation intact. Strength 5/5 in all 4.  Psychiatric: Alert and oriented x 3.    EKG: Not performed.  Assessment/Plan Principal Problem:   Cellulitis of right lower extremity Active Problems:   Uterine cancer (HCC)   Deficiency anemia   Hypothyroidism   Candice Parks is a 71 y.o. female with medical history significant for uterine cancer (s/p TAH and b/l  SOO) on Keytruda, iron and B12 deficiency anemia, hypothyroidism who is admitted with cellulitis of the right lower extremity.  Assessment and Plan: * Cellulitis of right lower extremity Progressive over 2 weeks with purulent bulla at the anterior shin.  Has not been on antibiotics.  She is immunosuppressed.  WBC 16 on admit. -Continue IV vancomycin and cefepime -CT negative for abscess, abnormal enhancement, or subcutaneous air -Obtain RLE venous US to rule out DVT  Hypothyroidism Markedly elevated TSH 48.593 on 02/18/2022.  Recently started on Synthroid 50 mcg, continue.  Repeat TSH with PCP in approximately 6 weeks.  Deficiency anemia Due to iron and B12 deficiencies.  Hemoglobin stable at 9.0.  Follows with heme-onc for iron infusions and B12 shots.  Uterine cancer [facility]) Follows with oncology, Dr. Alvy Bimler.  On active treatment with Keytruda.  DVT prophylaxis: enoxaparin (LOVENOX) injection 40 mg Start: 02/28/22 1000 Code Status: Full code, confirmed with patient on admission Family Communication: Discussed with patient, she  has discussed with family Disposition Plan: From home and likely discharge to home pending clinical progress Consults called: None Severity of Illness: The appropriate patient status for this patient is OBSERVATION. Observation status is judged to be reasonable and necessary in order to provide the required intensity of service to ensure the patient's safety. The patient's presenting symptoms, physical exam findings, and initial radiographic and laboratory data in the context of their medical condition is felt to place them at decreased risk for further clinical deterioration. Furthermore, it is anticipated that the patient will be medically stable for discharge from the hospital within 2 midnights of admission.   Zada Finders MD [facility]  If 7PM-7AM, please contact night-coverage [email or web address]  02/27/2022, 11:23 PM

## 2022-02-27 NOTE — Hospital Course (Signed)
Candice Parks is a 71 y.o. female with medical history significant for uterine cancer (s/p TAH and b/l SOO) on Keytruda, iron and B12 deficiency anemia, hypothyroidism who is admitted with cellulitis of the right lower extremity.

## 2022-02-27 NOTE — Assessment & Plan Note (Signed)
Follows with oncology, Dr. Alvy Bimler.  On active treatment with Keytruda.

## 2022-02-27 NOTE — Assessment & Plan Note (Addendum)
Markedly elevated TSH 48.593 on 02/18/2022.  Recently started on Synthroid 50 mcg, continue.  Repeat TSH with PCP in approximately 6 weeks.

## 2022-02-27 NOTE — ED Provider Notes (Signed)
[facility] DEPT Provider Note   CSN: [id number] Arrival date & time: 02/27/22  1732     History  Chief Complaint  Patient presents with   Leg Swelling    Candice Parks is a 71 y.o. female.  71 year old female presents with 1 week history of right lower extremity swelling.  Patient notes that she has had subjective myalgias.  No emesis.  Is able to ambulate.  Started noticing a purulent blister along her right mid anterior tibia today.  Has not been on antibiotics.  States that the area of redness and erythema has been rapidly spreading today.  Denies any shortness of breath.  Has noted increasing edema to the area      Home Medications Prior to Admission medications   Medication Sig Start Date End Date Taking? Authorizing Provider  levothyroxine (SYNTHROID) 50 MCG tablet Take 1 tablet (50 mcg total) by mouth daily before breakfast. 02/21/22   [name], MD  acetaminophen (TYLENOL) 325 MG tablet Take 650 mg by mouth as needed. 02/05/21   [provider]  furosemide (LASIX) 20 MG tablet TAKE 1 TABLET (20 MG TOTAL) BY MOUTH IN THE MORNING 02/25/22   [name], MD  lidocaine-prilocaine (EMLA) cream Apply to affected area once Patient not taking: Reported on 01/31/2022 03/05/21   [name], MD  magnesium hydroxide (MILK OF MAGNESIA) 400 MG/5ML suspension Take 5 mLs by mouth daily as needed for mild constipation.    [provider]  morphine (MSIR) 15 MG tablet Take 1 tablet by mouth every 6 hours as needed for severe pain. 02/18/22   [name], MD  ondansetron (ZOFRAN) 8 MG tablet Take 1 tablet (8 mg total) by mouth every 8 (eight) hours as needed. Patient not taking: Reported on 07/28/2021 03/05/21   [name], MD  polyethylene glycol (MIRALAX / GLYCOLAX) 17 g packet Take 17 g by mouth daily. 11/12/21   [name], MD  prochlorperazine (COMPAZINE) 10 MG tablet Take 1 tablet (10 mg total) by mouth every 6 (six) hours as needed  (Nausea or vomiting). Patient not taking: Reported on 07/28/2021 03/05/21   [name], MD      Allergies    Patient has no known allergies.    Review of Systems   Review of Systems  All other systems reviewed and are negative.   Physical Exam Updated Vital Signs BP 119/66 (BP Location: Right Arm)   Pulse 85   Temp 100.3 F (37.9 C) (Oral)   Resp 16   Ht 1.6 m ('5\' 3"'$ )   Wt 75 kg   SpO2 98%   BMI 29.29 kg/m  Physical Exam Vitals and nursing note reviewed.  Constitutional:      General: She is not in acute distress.    Appearance: Normal appearance. She is well-developed. She is not toxic-appearing.  HENT:     Head: Normocephalic and atraumatic.  Eyes:     General: Lids are normal.     Conjunctiva/sclera: Conjunctivae normal.     Pupils: Pupils are equal, round, and reactive to light.  Neck:     Thyroid: No thyroid mass.     Trachea: No tracheal deviation.  Cardiovascular:     Rate and Rhythm: Normal rate and regular rhythm.     Heart sounds: Normal heart sounds. No murmur heard.    No gallop.  Pulmonary:     Effort: Pulmonary effort is normal. No respiratory distress.     Breath sounds: Normal breath sounds.  No stridor. No decreased breath sounds, wheezing, rhonchi or rales.  Abdominal:     General: There is no distension.     Palpations: Abdomen is soft.     Tenderness: There is no abdominal tenderness. There is no rebound.  Musculoskeletal:        General: No tenderness. Normal range of motion.     Cervical back: Normal range of motion and neck supple.       Legs:  Skin:    General: Skin is warm and dry.     Findings: No abrasion or rash.  Neurological:     Mental Status: She is alert and oriented to person, place, and time. Mental status is at baseline.     GCS: GCS eye subscore is 4. GCS verbal subscore is 5. GCS motor subscore is 6.     Cranial Nerves: No cranial nerve deficit.     Sensory: No sensory deficit.     Motor: Motor function is intact.   Psychiatric:        Attention and Perception: Attention normal.        Speech: Speech normal.        Behavior: Behavior normal.    ED Results / Procedures / Treatments   Labs (all labs ordered are listed, but only abnormal results are displayed) Labs Reviewed  CULTURE, BLOOD (ROUTINE X 2)  CULTURE, BLOOD (ROUTINE X 2)  CBC WITH DIFFERENTIAL/PLATELET  LACTIC ACID, PLASMA  LACTIC ACID, PLASMA  COMPREHENSIVE METABOLIC PANEL    EKG None  Radiology No results found.  Procedures Procedures    Medications Ordered in ED Medications  0.9 %  sodium chloride infusion (has no administration in time range)  vancomycin (VANCOCIN) IVPB 1000 mg/200 mL premix (has no administration in time range)  piperacillin-tazobactam (ZOSYN) IVPB 3.375 g (has no administration in time range)    ED Course/ Medical Decision Making/ A&P                           Medical Decision Making Amount and/or Complexity of Data Reviewed Labs: ordered. Radiology: ordered.  Risk Prescription drug management.   Patient presents with right lower extremity swelling.  Also has pustule noted to.  Concern for possible deep infection and CT of lower extremity performed and per my review interpretation showed no evidence of necrotizing fasciitis.  She does have significant leukocytosis.  Lactate however is normal.  She was given empiric antibiotic therapy with vancomycin and Zosyn.  Patient will require admission for observation.  Will consult hospitalist team        Final Clinical Impression(s) / ED Diagnoses Final diagnoses:  None    Rx / DC Orders ED Discharge Orders     None         Lacretia Leigh, MD 02/27/22 2238

## 2022-02-27 NOTE — ED Triage Notes (Signed)
Pt reports a possible bug bite to right upper shin 2 weeks ago. Reports worsening leg swelling. Pt has blister to right mid shin. CHF pt. Endorses bilateral feet numbness.

## 2022-02-27 NOTE — Assessment & Plan Note (Signed)
Progressive over 2 weeks with purulent bulla at the anterior shin.  Has not been on antibiotics.  She is immunosuppressed.  WBC 16 on admit. -Continue IV vancomycin and cefepime -CT negative for abscess, abnormal enhancement, or subcutaneous air -Obtain RLE venous US to rule out DVT

## 2022-02-27 NOTE — Progress Notes (Signed)
Pharmacy Antibiotic Note  Candice Parks is a 71 y.o. female admitted on 02/27/2022 with worsening RLE cellulitis.  Pharmacy has been consulted for Vancomycin + Cefepime dosing.  Plan: Cefepime 2gm IV q8h Vancomycin '1250mg'$  IV q24h to target AUC 400-550.  Estimated AUC =496. Check Vancomycin levels at steady state. Monitor renal function and cx data   Height: '5\' 3"'$  (160 cm) Weight: 75 kg (165 lb 5.5 oz) IBW/kg (Calculated) : 52.4  Temp (24hrs), Avg:99.5 F (37.5 C), Min:98.6 F (37 C), Max:100.3 F (37.9 C)  Recent Labs  Lab 02/27/22 1813 02/27/22 1840 02/27/22 2045  WBC 16.0*  --   --   CREATININE  --  0.85  --   LATICACIDVEN  --  1.6 0.8    Estimated Creatinine Clearance: 59.7 mL/min (by C-G formula based on SCr of 0.85 mg/dL).    No Known Allergies  Antimicrobials this admission: 8/20 Zosyn x1 8/20 Vancomycin >>  8/21 Cefepime >>   Dose adjustments this admission:  Microbiology results: 8/20 BCx  Thank you for allowing pharmacy to be a part of this patient's care.  Netta Cedars PharmD 02/27/2022 11:19 PM

## 2022-02-27 NOTE — Assessment & Plan Note (Signed)
Due to iron and B12 deficiencies.  Hemoglobin stable at 9.0.  Follows with heme-onc for iron infusions and B12 shots.

## 2022-02-28 ENCOUNTER — Encounter (HOSPITAL_COMMUNITY): Payer: Self-pay | Admitting: Internal Medicine

## 2022-02-28 ENCOUNTER — Other Ambulatory Visit: Payer: Self-pay

## 2022-02-28 ENCOUNTER — Observation Stay (HOSPITAL_COMMUNITY): Payer: Medicare PPO

## 2022-02-28 DIAGNOSIS — C55 Malignant neoplasm of uterus, part unspecified: Secondary | ICD-10-CM | POA: Diagnosis present

## 2022-02-28 DIAGNOSIS — L03115 Cellulitis of right lower limb: Secondary | ICD-10-CM | POA: Diagnosis present

## 2022-02-28 DIAGNOSIS — L0390 Cellulitis, unspecified: Secondary | ICD-10-CM | POA: Diagnosis not present

## 2022-02-28 DIAGNOSIS — R52 Pain, unspecified: Secondary | ICD-10-CM | POA: Diagnosis not present

## 2022-02-28 DIAGNOSIS — M7989 Other specified soft tissue disorders: Secondary | ICD-10-CM

## 2022-02-28 DIAGNOSIS — D509 Iron deficiency anemia, unspecified: Secondary | ICD-10-CM | POA: Diagnosis present

## 2022-02-28 DIAGNOSIS — E039 Hypothyroidism, unspecified: Secondary | ICD-10-CM

## 2022-02-28 DIAGNOSIS — Z923 Personal history of irradiation: Secondary | ICD-10-CM | POA: Diagnosis not present

## 2022-02-28 DIAGNOSIS — Z9079 Acquired absence of other genital organ(s): Secondary | ICD-10-CM | POA: Diagnosis not present

## 2022-02-28 DIAGNOSIS — E663 Overweight: Secondary | ICD-10-CM | POA: Diagnosis present

## 2022-02-28 DIAGNOSIS — Z7722 Contact with and (suspected) exposure to environmental tobacco smoke (acute) (chronic): Secondary | ICD-10-CM | POA: Diagnosis present

## 2022-02-28 DIAGNOSIS — Z6829 Body mass index (BMI) 29.0-29.9, adult: Secondary | ICD-10-CM | POA: Diagnosis not present

## 2022-02-28 DIAGNOSIS — Z79899 Other long term (current) drug therapy: Secondary | ICD-10-CM | POA: Diagnosis not present

## 2022-02-28 DIAGNOSIS — D519 Vitamin B12 deficiency anemia, unspecified: Secondary | ICD-10-CM | POA: Diagnosis present

## 2022-02-28 DIAGNOSIS — D849 Immunodeficiency, unspecified: Secondary | ICD-10-CM | POA: Diagnosis present

## 2022-02-28 DIAGNOSIS — Z90710 Acquired absence of both cervix and uterus: Secondary | ICD-10-CM | POA: Diagnosis not present

## 2022-02-28 DIAGNOSIS — Z90722 Acquired absence of ovaries, bilateral: Secondary | ICD-10-CM | POA: Diagnosis not present

## 2022-02-28 DIAGNOSIS — E876 Hypokalemia: Secondary | ICD-10-CM | POA: Diagnosis present

## 2022-02-28 DIAGNOSIS — Z79890 Hormone replacement therapy (postmenopausal): Secondary | ICD-10-CM | POA: Diagnosis not present

## 2022-02-28 LAB — CBC
HCT: 27.9 % — ABNORMAL LOW (ref 36.0–46.0)
Hemoglobin: 8.6 g/dL — ABNORMAL LOW (ref 12.0–15.0)
MCH: 22.9 pg — ABNORMAL LOW (ref 26.0–34.0)
MCHC: 30.8 g/dL (ref 30.0–36.0)
MCV: 74.4 fL — ABNORMAL LOW (ref 80.0–100.0)
Platelets: 309 10*3/uL (ref 150–400)
RBC: 3.75 MIL/uL — ABNORMAL LOW (ref 3.87–5.11)
RDW: 18.6 % — ABNORMAL HIGH (ref 11.5–15.5)
WBC: 15 10*3/uL — ABNORMAL HIGH (ref 4.0–10.5)
nRBC: 0 % (ref 0.0–0.2)

## 2022-02-28 LAB — BASIC METABOLIC PANEL
Anion gap: 4 — ABNORMAL LOW (ref 5–15)
BUN: 7 mg/dL — ABNORMAL LOW (ref 8–23)
CO2: 26 mmol/L (ref 22–32)
Calcium: 8.1 mg/dL — ABNORMAL LOW (ref 8.9–10.3)
Chloride: 105 mmol/L (ref 98–111)
Creatinine, Ser: 0.7 mg/dL (ref 0.44–1.00)
GFR, Estimated: >60 mL/min (ref >60)
Glucose, Bld: 102 mg/dL — ABNORMAL HIGH (ref 70–99)
Potassium: 3.5 mmol/L (ref 3.5–5.1)
Sodium: 135 mmol/L (ref 135–145)

## 2022-02-28 LAB — HIV ANTIBODY (ROUTINE TESTING W REFLEX): HIV Screen 4th Generation wRfx: NONREACTIVE

## 2022-02-28 MED ORDER — ENSURE ENLIVE PO LIQD
237.0000 mL | Freq: Three times a day (TID) | ORAL | Status: DC
  Administered 2022-02-28 – 2022-03-04 (×12): 237 mL via ORAL
  Filled 2022-02-28 (×3): qty 237

## 2022-02-28 MED ORDER — POTASSIUM CHLORIDE CRYS ER 20 MEQ PO TBCR
40.0000 meq | EXTENDED_RELEASE_TABLET | Freq: Once | ORAL | Status: AC
  Administered 2022-02-28: 40 meq via ORAL
  Filled 2022-02-28: qty 2

## 2022-02-28 NOTE — Consult Note (Signed)
Reason for Consult:right leg swelling Referring Physician: Dr. Kathyrn Sheriff Chanler Parks is an 71 y.o. female.  HPI: 71 y/o female without significant PMH c/o R leg swelling for the last week.  She shows me photos of her leg with an area of subcutaneous fluid at the anterior tibia.  She came to the ER yesterday and was started on abx.  She is awaiting a room assignment.  She denies any trauma to the right leg.  No recent illnesses.  She is not diabetc and doesn't smoke.  She reports that the area of fluctuance was quite large but denies any drainage.  Past Medical History:  Diagnosis Date   Anemia    taking iron   History of radiation therapy    uterus/vagina 08/09/2021-09/08/2021  Dr Gery Pray   uterine ca 03/2021    Past Surgical History:  Procedure Laterality Date   ABDOMINAL HYSTERECTOMY     IR IMAGING GUIDED PORT INSERTION  03/22/2021   WISDOM TOOTH EXTRACTION      Family History  Problem Relation Age of Onset   Ovarian cancer Sister    Colon cancer Neg Hx    Breast cancer Neg Hx    Endometrial cancer Neg Hx    Pancreatic cancer Neg Hx    Prostate cancer Neg Hx     Social History:  reports that she has never smoked. She has been exposed to tobacco smoke. She has never used smokeless tobacco. She reports current alcohol use. She reports that she does not use drugs.  Allergies: No Known Allergies  Medications: I have reviewed the patient's current medications.  Results for orders placed or performed during the hospital encounter of 02/27/22 (from the past 48 hour(s))  CBC with Differential/Platelet     Status: Abnormal   Collection Time: 02/27/22  6:13 PM  Result Value Ref Range   WBC 16.0 (H) 4.0 - 10.5 K/uL   RBC 3.98 3.87 - 5.11 MIL/uL   Hemoglobin 9.0 (L) 12.0 - 15.0 g/dL   HCT 29.7 (L) 36.0 - 46.0 %   MCV 74.6 (L) 80.0 - 100.0 fL   MCH 22.6 (L) 26.0 - 34.0 pg   MCHC 30.3 30.0 - 36.0 g/dL   RDW 18.7 (H) 11.5 - 15.5 %   Platelets 324 150 - 400 K/uL   nRBC  0.0 0.0 - 0.2 %   Neutrophils Relative % 89 %   Neutro Abs 14.3 (H) 1.7 - 7.7 K/uL   Lymphocytes Relative 5 %   Lymphs Abs 0.8 0.7 - 4.0 K/uL   Monocytes Relative 4 %   Monocytes Absolute 0.7 0.1 - 1.0 K/uL   Eosinophils Relative 1 %   Eosinophils Absolute 0.1 0.0 - 0.5 K/uL   Basophils Relative 0 %   Basophils Absolute 0.0 0.0 - 0.1 K/uL   Immature Granulocytes 1 %   Abs Immature Granulocytes 0.13 (H) 0.00 - 0.07 K/uL    Comment: Performed at [facility], [place] [address] [place], [place] [address]  Lactic acid, plasma     Status: None   Collection Time: 02/27/22  6:40 PM  Result Value Ref Range   Lactic Acid, Venous 1.6 0.5 - 1.9 mmol/L    Comment: Performed at [facility], [place] [address] [place], [place] [address]  Comprehensive metabolic panel     Status: Abnormal   Collection Time: 02/27/22  6:40 PM  Result Value Ref Range   Sodium 135 135 - 145 mmol/L  Potassium 3.3 (L) 3.5 - 5.1 mmol/L   Chloride 101 98 - 111 mmol/L   CO2 26 22 - 32 mmol/L   Glucose, Bld 148 (H) 70 - 99 mg/dL    Comment: Glucose reference range applies only to samples taken after fasting for at least 8 hours.   BUN 8 8 - 23 mg/dL   Creatinine, Ser 0.85 0.44 - 1.00 mg/dL   Calcium 8.4 (L) 8.9 - 10.3 mg/dL   Total Protein 6.7 6.5 - 8.1 g/dL   Albumin 2.6 (L) 3.5 - 5.0 g/dL   AST 15 15 - 41 U/L   ALT 12 0 - 44 U/L   Alkaline Phosphatase 99 38 - 126 U/L   Total Bilirubin 0.4 0.3 - 1.2 mg/dL   GFR, Estimated >60 >60 mL/min    Comment: (NOTE) Calculated using the CKD-EPI Creatinine Equation (2021)    Anion gap 8 5 - 15    Comment: Performed at [facility], [place] [address], [place], [place] [address]  Lactic acid, plasma     Status: None   Collection Time: 02/27/22  8:45 PM  Result Value Ref Range   Lactic Acid, Venous 0.8 0.5 - 1.9 mmol/L    Comment: Performed at [facility], [place] [address] [place], [place] [address]   Basic metabolic panel     Status: Abnormal   Collection Time: 02/28/22  5:00 AM  Result Value Ref Range   Sodium 135 135 - 145 mmol/L   Potassium 3.5 3.5 - 5.1 mmol/L   Chloride 105 98 - 111 mmol/L   CO2 26 22 - 32 mmol/L   Glucose, Bld 102 (H) 70 - 99 mg/dL    Comment: Glucose reference range applies only to samples taken after fasting for at least 8 hours.   BUN 7 (L) 8 - 23 mg/dL   Creatinine, Ser 0.70 0.44 - 1.00 mg/dL   Calcium 8.1 (L) 8.9 - 10.3 mg/dL   GFR, Estimated >60 >60 mL/min    Comment: (NOTE) Calculated using the CKD-EPI Creatinine Equation (2021)    Anion gap 4 (L) 5 - 15    Comment: Performed at [facility], [place] [address] [place], [place] [address]  CBC     Status: Abnormal   Collection Time: 02/28/22  5:00 AM  Result Value Ref Range   WBC 15.0 (H) 4.0 - 10.5 K/uL   RBC 3.75 (L) 3.87 - 5.11 MIL/uL   Hemoglobin 8.6 (L) 12.0 - 15.0 g/dL    Comment: Reticulocyte Hemoglobin testing may be clinically indicated, consider ordering this additional test [id number]    HCT 27.9 (L) 36.0 - 46.0 %   MCV 74.4 (L) 80.0 - 100.0 fL   MCH 22.9 (L) 26.0 - 34.0 pg   MCHC 30.8 30.0 - 36.0 g/dL   RDW 18.6 (H) 11.5 - 15.5 %   Platelets 309 150 - 400 K/uL   nRBC 0.0 0.0 - 0.2 %    Comment: Performed at [facility], [place] [address], [place],  [address]    VAS [place] LOWER EXTREMITY VENOUS (DVT)  Result Date: 02/28/2022  Lower Venous DVT Study Patient Name:  Candice Parks  Date of Exam:   02/28/2022 Medical Rec #: [id number]            Accession #:    [id number] Date of Birth: 1951-01-22            Patient Gender: F Patient Age:  71 years Exam Location:  [facility] Procedure:      VAS [place] LOWER EXTREMITY VENOUS (DVT) Referring Phys: VISHAL PATEL --------------------------------------------------------------------------------  Indications: Pain, swelling, warmth (cellulitis).  Limitations: Body habitus and poor ultrasound/tissue  interface. Comparison Study: No previous exams Performing Technologist: Jody Hill RVT, RDMS  Examination Guidelines: A complete evaluation includes B-mode imaging, spectral Doppler, color Doppler, and power Doppler as needed of all accessible portions of each vessel. Bilateral testing is considered an integral part of a complete examination. Limited examinations for reoccurring indications may be performed as noted. The reflux portion of the exam is performed with the patient in reverse Trendelenburg.  +--------+---------------+---------+-----------+----------+--------------------+ RIGHT   CompressibilityPhasicitySpontaneityPropertiesThrombus Aging       +--------+---------------+---------+-----------+----------+--------------------+ CFV     Full           Yes      Yes                                       +--------+---------------+---------+-----------+----------+--------------------+ SFJ     Full                                                              +--------+---------------+---------+-----------+----------+--------------------+ FV Prox Full           Yes      Yes                                       +--------+---------------+---------+-----------+----------+--------------------+ FV Mid  Full           Yes      Yes                                       +--------+---------------+---------+-----------+----------+--------------------+ FV      Full           Yes      Yes                                       Distal                                                                    +--------+---------------+---------+-----------+----------+--------------------+ PFV     Full                                                              +--------+---------------+---------+-----------+----------+--------------------+ POP     Full           Yes      Yes                                        +--------+---------------+---------+-----------+----------+--------------------+  PTV                    Yes      Yes                  patent by                                                                 color/doppler        +--------+---------------+---------+-----------+----------+--------------------+ PERO                                                 Not well visualized  +--------+---------------+---------+-----------+----------+--------------------+   +----+---------------+---------+-----------+----------+--------------+ LEFTCompressibilityPhasicitySpontaneityPropertiesThrombus Aging +----+---------------+---------+-----------+----------+--------------+ CFV Full           Yes      Yes                                 +----+---------------+---------+-----------+----------+--------------+    Summary: RIGHT: - There is no evidence of deep vein thrombosis in the lower extremity.  - Ultrasound characteristics of enlarged lymph nodes are noted in the groin. Diffuse subcutaneous edema  LEFT: - No evidence of common femoral vein obstruction. - Ultrasound characteristics of enlarged lymph nodes noted in the groin.  *See table(s) above for measurements and observations.    Preliminary    CT EXTREMITY LOWER RIGHT W CONTRAST  Result Date: 02/27/2022 CLINICAL DATA:  Soft tissue mass, lower leg, deep. Possible bug bite to right upper shin 2 weeks ago. Worsening leg swelling. EXAM: CT OF THE LOWER RIGHT EXTREMITY WITH CONTRAST TECHNIQUE: Multidetector CT imaging of the lower right extremity was performed according to the standard protocol following intravenous contrast administration. RADIATION DOSE REDUCTION: This exam was performed according to the departmental dose-optimization program which includes automated exposure control, adjustment of the mA and/or kV according to patient size and/or use of iterative reconstruction technique. CONTRAST:  59m OMNIPAQUE IOHEXOL 300 MG/ML   SOLN COMPARISON:  None Available. FINDINGS: Bones/Joint/Cartilage No acute fracture or dislocation. Moderate-to-severe tricompartmental degenerative changes, most pronounced in the medial compartment. A subchondral defect is noted in the talar dome laterally, possible osteochondral defect. Degenerative changes are present at the ankle. Mild calcaneal spurring. No joint effusion at the knee. Ligaments Suboptimally assessed by CT. Muscles and Tendons No abnormal enhancement or abscess. Mild intramuscular edema is noted. Soft tissues Diffuse subcutaneous soft tissue swelling and edema are noted. No subcutaneous air. There is a 2.5 cm blister along the anterior aspect of the mid tibia. No abscess is identified. Focal fluid collections are noted in the popliteal fossa, possible Baker's cysts. Calcifications are noted in the Baker's cyst, possible loose bodies. IMPRESSION: 1. Diffuse subcutaneous edema and fat stranding. No rim enhancing fluid collection is seen to suggest abscess. 2. Blister along the anterior aspect of the mid tibia. 3. Diffuse intramuscular edema without evidence of abnormal enhancement or subcutaneous air. 4. Moderate-to-severe degenerative changes at the knee. 5. Fluid collections in the popliteal fossa containing calcifications, possible Baker's cysts with loose bodies. Electronically Signed   By: LRegan RakersD.  On: 02/27/2022 22:30   DG Chest Port 1 View  Result Date: 02/27/2022 CLINICAL DATA:  Shortness of breath, pain and swelling right lower leg EXAM: PORTABLE CHEST 1 VIEW COMPARISON:  None Available. FINDINGS: Transverse diameter of heart is increased. There are no signs of pulmonary edema or focal pulmonary consolidation. There is no pleural effusion or pneumothorax. Tip of right IJ chest port is seen in superior vena cava. IMPRESSION: Cardiomegaly. There are no signs of pulmonary edema or focal pulmonary consolidation. Electronically Signed   By: Elmer Picker M.D.   On:  02/27/2022 19:23    ROS:  no recent f/c/n/v/wt loss.  10 system review is o/w negative PE:  Blood pressure 105/64, pulse 85, temperature 98.7 F (37.1 C), temperature source Oral, resp. rate 16, height '5\' 3"'$  (1.6 m), weight 75 kg, SpO2 100 %. 32 wd woman in nad.  A and O x 4.  Normal mood and affect.  EOMI.  Resp unlabored.  R leg swollen and erythematous.  At the anterior leg there is the cutaneous remnants of a large blister.  The bed sheets around this area are soaked.  No purulence.  Intact sens to LT at the right foot is intac.t  no lymphaden9opathy or lymphjangiitis.  5/5 strength in PF and DF of the ankle and toes.  Brisk cap refill at the toes.  Assessment/Plan: R leg pre tibial blister and cellulitis - the superficial blister has spontaneously ruptured.  The skin beneath is intact and there is no evidence of abscess.  I concur with IV abx for cellulitis.  No indication for surgery at this time.  OK to give blood thinners as indicated.  She is safe to ambulate as tolerated.  Dry dressing to the area as needed.  We'll plan to see her tomorrow  Wylene Simmer 02/28/2022, 1:04 PM

## 2022-02-28 NOTE — Progress Notes (Addendum)
[facility] PROGRESS NOTE   Candice Parks ZSW:[id number] DOB: [birth year] DOA: 02/27/2022  PCP: Pcp, No  Brief History/Interval Summary: 71 y.o. female with medical history significant for uterine cancer (s/p TAH and b/l SOO) on Keytruda, iron and B12 deficiency anemia, hypothyroidism who presented to the ED for evaluation of right lower extremity swelling.  She was found to have cellulitis.  CT did not raise concern for deep abscess.  She does have a blister on the anterior aspect which appears to be purulent.  Consultants: None yet  Procedures: None    Subjective/Interval History: Patient complains of pain in the right leg.  There is also some swelling.  She mentions that the swelling is worsened in the last 24 hours.  Denies any nausea vomiting.  Denies any fevers at home.    Assessment/Plan:  Cellulitis involving the right lower extremity Apparently this has been progressive over the last 2 weeks.  She has a purulent bulla over the anterior shin which appears to have increased in size.  Noted to be afebrile this morning. WBC was 16,000 yesterday and noted to be 15,000 today. Patient started on vancomycin and cefepime. CT of the lower extremity was negative for any abscess.  She has good pedal pulses. She does have a purulent appearing blister over the anterior aspect of the leg.  We will see if it can be drained. Follow-up on blood cultures.  Lactic acid level was normal. Follow-up on right lower extremity venous Doppler study.  Hypothyroidism Found to have a markedly elevated TSH of 48.59 on August 11.  Recently started on Synthroid 50 mcg daily which is being continued.  Will need to be followed up by PCP with repeat thyroid function tests in 3 to 4 weeks.  History of uterine cancer Followed by Dr. Simeon Craft such.  On active treatment with Keytruda.  Anemia secondary to iron and B12 deficiency Hemoglobin did drop slightly but stable for the most part.  No evidence  for overt bleeding.  She gets iron infusions and B12 injections at the cancer center.  DVT Prophylaxis: Lovenox Code Status: Full code Family Communication: Discussed with patient Disposition Plan: Hopefully return home in improved.  Status is: Observation The patient will require care spanning > 2 midnights and should be moved to inpatient because: Right lower extremity cellulitis requiring IV antibiotics      Medications: Scheduled:  enoxaparin (LOVENOX) injection  40 mg Subcutaneous Q24H   feeding supplement  237 mL Oral TID with meals   levothyroxine  50 mcg Oral Q0600   Continuous:  sodium chloride Stopped (02/28/22 3220)   ceFEPime (MAXIPIME) IV Stopped (02/28/22 2542)   vancomycin     HCW:[id number] **OR** acetaminophen, HYDROcodone-acetaminophen, morphine injection, ondansetron **OR** ondansetron (ZOFRAN) IV  Antibiotics: Anti-infectives (From admission, onward)    Start     Dose/Rate Route Frequency Ordered Stop   02/28/22 2000  vancomycin (VANCOREADY) IVPB 1250 mg/250 mL        1,250 mg 166.7 mL/hr over 90 Minutes Intravenous Every 24 hours 02/27/22 2330     02/28/22 0200  ceFEPIme (MAXIPIME) 2 g in sodium chloride 0.9 % 100 mL IVPB        2 g 200 mL/hr over 30 Minutes Intravenous Every 8 hours 02/27/22 2330     02/27/22 2345  vancomycin (VANCOREADY) IVPB 500 mg/100 mL        500 mg 100 mL/hr over 60 Minutes Intravenous NOW 02/27/22 2330 02/28/22 0613   02/27/22 1830  vancomycin (  VANCOCIN) IVPB 1000 mg/200 mL premix        1,000 mg 200 mL/hr over 60 Minutes Intravenous  Once 02/27/22 1823 02/28/22 0613   02/27/22 1830  piperacillin-tazobactam (ZOSYN) IVPB 3.375 g        3.375 g 100 mL/hr over 30 Minutes Intravenous  Once 02/27/22 1823 02/27/22 2206       Objective:  Vital Signs  Vitals:   02/28/22 0505 02/28/22 0700 02/28/22 0730 02/28/22 0847  BP:  132/64 104/64   Pulse:  67 72   Resp:  16 15   Temp: 98.7 F (37.1 C)   98.9 F (37.2 C)   TempSrc: Oral   Oral  SpO2:  95% 100%   Weight:      Height:        Intake/Output Summary (Last 24 hours) at 02/28/2022 0907 Last data filed at 02/28/2022 8295 Gross per 24 hour  Intake 631.53 ml  Output --  Net 631.53 ml   Filed Weights   02/27/22 1800  Weight: 75 kg    General appearance: Awake alert.  In no distress Resp: Clear to auscultation bilaterally.  Normal effort Cardio: S1-S2 is normal regular.  No S3-S4.  No rubs murmurs or bruit GI: Abdomen is soft.  Nontender nondistended.  Bowel sounds are present normal.  No masses organomegaly Extremities: Swollen right lower extremity with erythema warmth.  Mild tenderness.  Good pedal pulses.  There is a purulent blister over the shin. Neurologic: Alert and oriented x3.  No focal neurological deficits.    Lab Results:  Data Reviewed: I have personally reviewed following labs and reports of the imaging studies  CBC: Recent Labs  Lab 02/27/22 1813 02/28/22 0500  WBC 16.0* 15.0*  NEUTROABS 14.3*  --   HGB 9.0* 8.6*  HCT 29.7* 27.9*  MCV 74.6* 74.4*  PLT 324 621    Basic Metabolic Panel: Recent Labs  Lab 02/27/22 1840 02/28/22 0500  NA 135 135  K 3.3* 3.5  CL 101 105  CO2 26 26  GLUCOSE 148* 102*  BUN 8 7*  CREATININE 0.85 0.70  CALCIUM 8.4* 8.1*    GFR: Estimated Creatinine Clearance: 63.4 mL/min (by C-G formula based on SCr of 0.7 mg/dL).  Liver Function Tests: Recent Labs  Lab 02/27/22 1840  AST 15  ALT 12  ALKPHOS 99  BILITOT 0.4  PROT 6.7  ALBUMIN 2.6*     Radiology Studies: CT EXTREMITY LOWER RIGHT W CONTRAST  Result Date: 02/27/2022 CLINICAL DATA:  Soft tissue mass, lower leg, deep. Possible bug bite to right upper shin 2 weeks ago. Worsening leg swelling. EXAM: CT OF THE LOWER RIGHT EXTREMITY WITH CONTRAST TECHNIQUE: Multidetector CT imaging of the lower right extremity was performed according to the standard protocol following intravenous contrast administration. RADIATION DOSE  REDUCTION: This exam was performed according to the departmental dose-optimization program which includes automated exposure control, adjustment of the mA and/or kV according to patient size and/or use of iterative reconstruction technique. CONTRAST:  7m OMNIPAQUE IOHEXOL 300 MG/ML  SOLN COMPARISON:  None Available. FINDINGS: Bones/Joint/Cartilage No acute fracture or dislocation. Moderate-to-severe tricompartmental degenerative changes, most pronounced in the medial compartment. A subchondral defect is noted in the talar dome laterally, possible osteochondral defect. Degenerative changes are present at the ankle. Mild calcaneal spurring. No joint effusion at the knee. Ligaments Suboptimally assessed by CT. Muscles and Tendons No abnormal enhancement or abscess. Mild intramuscular edema is noted. Soft tissues Diffuse subcutaneous soft tissue swelling and edema are  noted. No subcutaneous air. There is a 2.5 cm blister along the anterior aspect of the mid tibia. No abscess is identified. Focal fluid collections are noted in the popliteal fossa, possible Baker's cysts. Calcifications are noted in the Baker's cyst, possible loose bodies. IMPRESSION: 1. Diffuse subcutaneous edema and fat stranding. No rim enhancing fluid collection is seen to suggest abscess. 2. Blister along the anterior aspect of the mid tibia. 3. Diffuse intramuscular edema without evidence of abnormal enhancement or subcutaneous air. 4. Moderate-to-severe degenerative changes at the knee. 5. Fluid collections in the popliteal fossa containing calcifications, possible Baker's cysts with loose bodies. Electronically Signed   By: Brett Fairy M.D.   On: 02/27/2022 22:30   DG Chest Port 1 View  Result Date: 02/27/2022 CLINICAL DATA:  Shortness of breath, pain and swelling right lower leg EXAM: PORTABLE CHEST 1 VIEW COMPARISON:  None Available. FINDINGS: Transverse diameter of heart is increased. There are no signs of pulmonary edema or focal  pulmonary consolidation. There is no pleural effusion or pneumothorax. Tip of right IJ chest port is seen in superior vena cava. IMPRESSION: Cardiomegaly. There are no signs of pulmonary edema or focal pulmonary consolidation. Electronically Signed   By: Elmer Picker M.D.   On: 02/27/2022 19:23       LOS: 0 days   [facility] Pager on [email or web address]  02/28/2022, 9:07 AM

## 2022-02-28 NOTE — ED Notes (Signed)
Pt taken to bathroom.

## 2022-02-28 NOTE — Progress Notes (Signed)
RLE venous duplex has been completed.     Results can be found under chart review under CV PROC. 02/28/2022 11:00 AM Naileah Karg RVT, RDMS

## 2022-03-01 DIAGNOSIS — E039 Hypothyroidism, unspecified: Secondary | ICD-10-CM | POA: Diagnosis not present

## 2022-03-01 DIAGNOSIS — L03115 Cellulitis of right lower limb: Secondary | ICD-10-CM | POA: Diagnosis not present

## 2022-03-01 DIAGNOSIS — C55 Malignant neoplasm of uterus, part unspecified: Secondary | ICD-10-CM | POA: Diagnosis not present

## 2022-03-01 LAB — CBC
HCT: 29 % — ABNORMAL LOW (ref 36.0–46.0)
Hemoglobin: 8.9 g/dL — ABNORMAL LOW (ref 12.0–15.0)
MCH: 22.9 pg — ABNORMAL LOW (ref 26.0–34.0)
MCHC: 30.7 g/dL (ref 30.0–36.0)
MCV: 74.7 fL — ABNORMAL LOW (ref 80.0–100.0)
Platelets: 340 10*3/uL (ref 150–400)
RBC: 3.88 MIL/uL (ref 3.87–5.11)
RDW: 18.8 % — ABNORMAL HIGH (ref 11.5–15.5)
WBC: 9.8 10*3/uL (ref 4.0–10.5)
nRBC: 0 % (ref 0.0–0.2)

## 2022-03-01 LAB — BASIC METABOLIC PANEL
Anion gap: 5 (ref 5–15)
BUN: 8 mg/dL (ref 8–23)
CO2: 25 mmol/L (ref 22–32)
Calcium: 8.6 mg/dL — ABNORMAL LOW (ref 8.9–10.3)
Chloride: 106 mmol/L (ref 98–111)
Creatinine, Ser: 0.72 mg/dL (ref 0.44–1.00)
GFR, Estimated: >60 mL/min (ref >60)
Glucose, Bld: 92 mg/dL (ref 70–99)
Potassium: 4 mmol/L (ref 3.5–5.1)
Sodium: 136 mmol/L (ref 135–145)

## 2022-03-01 LAB — MAGNESIUM: Magnesium: 1.7 mg/dL (ref 1.7–2.4)

## 2022-03-01 MED ORDER — ENOXAPARIN SODIUM 40 MG/0.4ML IJ SOSY
40.0000 mg | PREFILLED_SYRINGE | INTRAMUSCULAR | Status: DC
  Administered 2022-03-01 – 2022-03-03 (×3): 40 mg via SUBCUTANEOUS
  Filled 2022-03-01 (×3): qty 0.4

## 2022-03-01 MED ORDER — LIP MEDEX EX OINT
TOPICAL_OINTMENT | CUTANEOUS | Status: DC | PRN
  Administered 2022-03-01: 1 via TOPICAL
  Filled 2022-03-01 (×2): qty 7

## 2022-03-01 MED ORDER — CHLORHEXIDINE GLUCONATE CLOTH 2 % EX PADS
6.0000 | MEDICATED_PAD | Freq: Every day | CUTANEOUS | Status: DC
  Administered 2022-03-01 – 2022-03-03 (×3): 6 via TOPICAL

## 2022-03-01 MED ORDER — MORPHINE SULFATE (PF) 2 MG/ML IV SOLN
1.0000 mg | Freq: Once | INTRAVENOUS | Status: AC
  Administered 2022-03-01: 1 mg via INTRAVENOUS
  Filled 2022-03-01: qty 1

## 2022-03-01 MED ORDER — SODIUM CHLORIDE 0.9% FLUSH
10.0000 mL | Freq: Two times a day (BID) | INTRAVENOUS | Status: DC
  Administered 2022-03-01 – 2022-03-04 (×6): 10 mL

## 2022-03-01 MED ORDER — POLYETHYLENE GLYCOL 3350 17 G PO PACK
17.0000 g | PACK | Freq: Every day | ORAL | Status: DC
  Administered 2022-03-01 – 2022-03-04 (×4): 17 g via ORAL
  Filled 2022-03-01 (×5): qty 1

## 2022-03-01 NOTE — Plan of Care (Signed)

## 2022-03-01 NOTE — Progress Notes (Signed)
       CROSS COVER NOTE  NAME: Candice Parks MRN: [id number] DOB : 09-13-1950    Date of Service   03/01/22  HPI/Events of Note   Patient request received for Heating pad for comfort.  2245: M(r)s Balan is reporting 10/10 leg pain refractory to ordered Norco, her leg pain has been ongoing and is secondary to cellulitis.  Interventions   Plan: Aquathermia  1 mg Morphine x1    This document was prepared using Dragon voice recognition software and may include unintentional dictation errors.  Neomia Glass DNP, MHA, FNP-BC Nurse Practitioner [facility] Pager [phone]

## 2022-03-01 NOTE — Progress Notes (Signed)
[facility] PROGRESS NOTE   Candice Parks VOZ:[id number] DOB: [birth year] DOA: 02/27/2022  PCP: Pcp, No  Brief History/Interval Summary: 71 y.o. female with medical history significant for uterine cancer (s/p TAH and b/l SOO) on Keytruda, iron and B12 deficiency anemia, hypothyroidism who presented to the ED for evaluation of right lower extremity swelling.  She was found to have cellulitis.  CT did not raise concern for deep abscess.  She does have a blister on the anterior aspect which appears to be purulent.  Consultants: Orthopedics  Procedures: None    Subjective/Interval History: Patient mentions that she is feeling much better this morning compared to yesterday.  The blister over the right leg "popped" on its own.  Pain has improved.  Denies any nausea vomiting.      Assessment/Plan:  Cellulitis involving the right lower extremity Apparently this has been progressive over 2 weeks.  She had a purulent bulla over the anterior shin which increased in size.   Patient was started on vancomycin and cefepime.   CT of the lower extremity was negative for any abscess.  However she did have this blister which was quite tense and appeared to be purulent.  Orthopedic surgery (Dr. Doran Parks) was consulted to help with debridement but before they could evaluate the patient the blister popped on its own. Patient symptomatically is feeling better this morning.  WBC has improved further.  Remains afebrile.   Continue current antibiotics for now.  Follow-up on blood cultures are negative so far.  Lactic acid level was normal.  Right lower extremity Doppler study negative for DVT.    Hypothyroidism Found to have a markedly elevated TSH of 48.59 on August 11.  Recently started on Synthroid 50 mcg daily which is being continued.  Will need to be followed up by PCP with repeat thyroid function tests in 3 to 4 weeks.  History of uterine cancer Followed by Dr. Alvy Parks.  On active treatment  with Keytruda.  Anemia secondary to iron and B12 deficiency Hemoglobin did drop slightly but stable for the most part.  No evidence for overt bleeding.  She gets iron infusions and B12 injections at the cancer center.  DVT Prophylaxis: Lovenox can be reinitiated Code Status: Full code Family Communication: Discussed with patient Disposition Plan: Hopefully return home in improved.  Status is: Inpatient Remains inpatient appropriate because: Right lower extremity cellulitis requiring IV antibiotics     Medications: Scheduled:  Chlorhexidine Gluconate Cloth  6 each Topical Daily   feeding supplement  237 mL Oral TID with meals   levothyroxine  50 mcg Oral Q0600   polyethylene glycol  17 g Oral Daily   sodium chloride flush  10-40 mL Intracatheter Q12H   Continuous:  sodium chloride Stopped (02/28/22 4259)   ceFEPime (MAXIPIME) IV 2 g (03/01/22 5638)   vancomycin Stopped (02/28/22 2230)   VFI:EPPIRJJOACZYS **OR** acetaminophen, HYDROcodone-acetaminophen, lip balm, morphine injection, ondansetron **OR** ondansetron (ZOFRAN) IV  Antibiotics: Anti-infectives (From admission, onward)    Start     Dose/Rate Route Frequency Ordered Stop   02/28/22 2000  vancomycin (VANCOREADY) IVPB 1250 mg/250 mL        1,250 mg 166.7 mL/hr over 90 Minutes Intravenous Every 24 hours 02/27/22 2330     02/28/22 0200  ceFEPIme (MAXIPIME) 2 g in sodium chloride 0.9 % 100 mL IVPB        2 g 200 mL/hr over 30 Minutes Intravenous Every 8 hours 02/27/22 2330     02/27/22 2345  vancomycin (VANCOREADY) IVPB 500 mg/100 mL        500 mg 100 mL/hr over 60 Minutes Intravenous NOW 02/27/22 2330 02/28/22 0613   02/27/22 1830  vancomycin (VANCOCIN) IVPB 1000 mg/200 mL premix        1,000 mg 200 mL/hr over 60 Minutes Intravenous  Once 02/27/22 1823 02/28/22 0613   02/27/22 1830  piperacillin-tazobactam (ZOSYN) IVPB 3.375 g        3.375 g 100 mL/hr over 30 Minutes Intravenous  Once 02/27/22 1823 02/27/22 2206        Objective:  Vital Signs  Vitals:   02/28/22 1528 02/28/22 1944 02/28/22 2325 03/01/22 0345  BP: 113/64 108/84 100/61 (!) 94/54  Pulse: 74 83 74 68  Resp: '18 16 16 16  '$ Temp: 98.5 F (36.9 C) 99 F (37.2 C) 99.5 F (37.5 C) 98.3 F (36.8 C)  TempSrc: Oral Oral  Oral  SpO2: 100% 100% 95% 97%  Weight:      Height:        Intake/Output Summary (Last 24 hours) at 03/01/2022 0949 Last data filed at 03/01/2022 0300 Gross per 24 hour  Intake 649.19 ml  Output --  Net 649.19 ml    Filed Weights   02/27/22 1800  Weight: 75 kg    General appearance: Awake alert.  In no distress Resp: Clear to auscultation bilaterally.  Normal effort Cardio: S1-S2 is normal regular.  No S3-S4.  No rubs murmurs or bruit GI: Abdomen is soft.  Nontender nondistended.  Bowel sounds are present normal.  No masses organomegaly Extremities: Right leg continues to be swollen.  The blister has collapsed.  Tenderness has improved.  Less warmth.  Good pedal pulses. Neurologic: Alert and oriented x3.  No focal neurological deficits.     Lab Results:  Data Reviewed: I have personally reviewed following labs and reports of the imaging studies  CBC: Recent Labs  Lab 02/27/22 1813 02/28/22 0500 03/01/22 0532  WBC 16.0* 15.0* 9.8  NEUTROABS 14.3*  --   --   HGB 9.0* 8.6* 8.9*  HCT 29.7* 27.9* 29.0*  MCV 74.6* 74.4* 74.7*  PLT 324 309 340     Basic Metabolic Panel: Recent Labs  Lab 02/27/22 1840 02/28/22 0500 03/01/22 0532  NA 135 135 136  K 3.3* 3.5 4.0  CL 101 105 106  CO2 '26 26 25  '$ GLUCOSE 148* 102* 92  BUN 8 7* 8  CREATININE 0.85 0.70 0.72  CALCIUM 8.4* 8.1* 8.6*  MG  --   --  1.7     GFR: Estimated Creatinine Clearance: 63.4 mL/min (by C-G formula based on SCr of 0.72 mg/dL).  Liver Function Tests: Recent Labs  Lab 02/27/22 1840  AST 15  ALT 12  ALKPHOS 99  BILITOT 0.4  PROT 6.7  ALBUMIN 2.6*      Radiology Studies: VAS [place] LOWER EXTREMITY VENOUS  (DVT)  Result Date: 02/28/2022  Lower Venous DVT Study Patient Name:  Candice Parks  Date of Exam:   02/28/2022 Medical Rec #: [id number]            Accession #:    [id number] Date of Birth: [birth year]            Patient Gender: F Patient Age:   71 years Exam Location:  [facility] Procedure:      VAS [place] LOWER EXTREMITY VENOUS (DVT) Referring Phys: Roxanne Mins PATEL --------------------------------------------------------------------------------  Indications: Pain, swelling, warmth (cellulitis).  Limitations: Body habitus and poor ultrasound/tissue interface.  Comparison Study: No previous exams Performing Technologist: Jody Hill RVT, RDMS  Examination Guidelines: A complete evaluation includes B-mode imaging, spectral Doppler, color Doppler, and power Doppler as needed of all accessible portions of each vessel. Bilateral testing is considered an integral part of a complete examination. Limited examinations for reoccurring indications may be performed as noted. The reflux portion of the exam is performed with the patient in reverse Trendelenburg.  +--------+---------------+---------+-----------+----------+--------------------+ RIGHT   CompressibilityPhasicitySpontaneityPropertiesThrombus Aging       +--------+---------------+---------+-----------+----------+--------------------+ CFV     Full           Yes      Yes                                       +--------+---------------+---------+-----------+----------+--------------------+ SFJ     Full                                                              +--------+---------------+---------+-----------+----------+--------------------+ FV Prox Full           Yes      Yes                                       +--------+---------------+---------+-----------+----------+--------------------+ FV Mid  Full           Yes      Yes                                        +--------+---------------+---------+-----------+----------+--------------------+ FV      Full           Yes      Yes                                       Distal                                                                    +--------+---------------+---------+-----------+----------+--------------------+ PFV     Full                                                              +--------+---------------+---------+-----------+----------+--------------------+ POP     Full           Yes      Yes                                       +--------+---------------+---------+-----------+----------+--------------------+ PTV  Yes      Yes                  patent by                                                                 color/doppler        +--------+---------------+---------+-----------+----------+--------------------+ PERO                                                 Not well visualized  +--------+---------------+---------+-----------+----------+--------------------+   +----+---------------+---------+-----------+----------+--------------+ LEFTCompressibilityPhasicitySpontaneityPropertiesThrombus Aging +----+---------------+---------+-----------+----------+--------------+ CFV Full           Yes      Yes                                 +----+---------------+---------+-----------+----------+--------------+    Summary: RIGHT: - There is no evidence of deep vein thrombosis in the lower extremity.  - Ultrasound characteristics of enlarged lymph nodes are noted in the groin. Diffuse subcutaneous edema  LEFT: - No evidence of common femoral vein obstruction. - Ultrasound characteristics of enlarged lymph nodes noted in the groin.  *See table(s) above for measurements and observations.    Preliminary    CT EXTREMITY LOWER RIGHT W CONTRAST  Result Date: 02/27/2022 CLINICAL DATA:  Soft tissue mass, lower leg, deep. Possible bug bite to  right upper shin 2 weeks ago. Worsening leg swelling. EXAM: CT OF THE LOWER RIGHT EXTREMITY WITH CONTRAST TECHNIQUE: Multidetector CT imaging of the lower right extremity was performed according to the standard protocol following intravenous contrast administration. RADIATION DOSE REDUCTION: This exam was performed according to the departmental dose-optimization program which includes automated exposure control, adjustment of the mA and/or kV according to patient size and/or use of iterative reconstruction technique. CONTRAST:  66m OMNIPAQUE IOHEXOL 300 MG/ML  SOLN COMPARISON:  None Available. FINDINGS: Bones/Joint/Cartilage No acute fracture or dislocation. Moderate-to-severe tricompartmental degenerative changes, most pronounced in the medial compartment. A subchondral defect is noted in the talar dome laterally, possible osteochondral defect. Degenerative changes are present at the ankle. Mild calcaneal spurring. No joint effusion at the knee. Ligaments Suboptimally assessed by CT. Muscles and Tendons No abnormal enhancement or abscess. Mild intramuscular edema is noted. Soft tissues Diffuse subcutaneous soft tissue swelling and edema are noted. No subcutaneous air. There is a 2.5 cm blister along the anterior aspect of the mid tibia. No abscess is identified. Focal fluid collections are noted in the popliteal fossa, possible Baker's cysts. Calcifications are noted in the Baker's cyst, possible loose bodies. IMPRESSION: 1. Diffuse subcutaneous edema and fat stranding. No rim enhancing fluid collection is seen to suggest abscess. 2. Blister along the anterior aspect of the mid tibia. 3. Diffuse intramuscular edema without evidence of abnormal enhancement or subcutaneous air. 4. Moderate-to-severe degenerative changes at the knee. 5. Fluid collections in the popliteal fossa containing calcifications, possible Baker's cysts with loose bodies. Electronically Signed   By: LBrett FairyM.D.   On: 02/27/2022 22:30    DG Chest Port 1 View  Result Date: 02/27/2022 CLINICAL DATA:  Shortness  of breath, pain and swelling right lower leg EXAM: PORTABLE CHEST 1 VIEW COMPARISON:  None Available. FINDINGS: Transverse diameter of heart is increased. There are no signs of pulmonary edema or focal pulmonary consolidation. There is no pleural effusion or pneumothorax. Tip of right IJ chest port is seen in superior vena cava. IMPRESSION: Cardiomegaly. There are no signs of pulmonary edema or focal pulmonary consolidation. Electronically Signed   By: Elmer Picker M.D.   On: 02/27/2022 19:23       LOS: 1 day   [facility] Pager on [email or web address]  03/01/2022, 9:49 AM

## 2022-03-01 NOTE — Progress Notes (Signed)
Subjective: HD 2 for right leg cellulitis.  Pt feels much better today.  She is tolerating a regular diet and reports decreased pain and swelling of the right leg.  She's on vanc and cefepime.   Objective: Vital signs in last 24 hours: Temp:  [98.3 F (36.8 C)-99.5 F (37.5 C)] 98.3 F (36.8 C) (08/22 0345) Pulse Rate:  [68-85] 68 (08/22 0345) Resp:  [14-18] 16 (08/22 0345) BP: (94-113)/(54-84) 94/54 (08/22 0345) SpO2:  [95 %-100 %] 97 % (08/22 0345)  Intake/Output from previous day: 08/21 0701 - 08/22 0700 In: 649.2 [IV Piggyback:649.2] Out: -  Intake/Output this shift: No intake/output data recorded.  Recent Labs    02/27/22 1813 02/28/22 0500 03/01/22 0532  HGB 9.0* 8.6* 8.9*   Recent Labs    02/28/22 0500 03/01/22 0532  WBC 15.0* 9.8  RBC 3.75* 3.88  HCT 27.9* 29.0*  PLT 309 340   Recent Labs    02/28/22 0500 03/01/22 0532  NA 135 136  K 3.5 4.0  CL 105 106  CO2 26 25  BUN 7* 8  CREATININE 0.70 0.72  GLUCOSE 102* 92  CALCIUM 8.1* 8.6*   No results for input(s): "LABPT", "INR" in the last 72 hours.  PE:  right leg with superficial skin skin slough where the blister popped yesterday.  Less swelling as compared to yesterday.  No significant erythema or tenderness today.    Assessment/Plan: Right leg cellulitis - the patient is responding to IV abx.  There is no indication for surgical treatment.  IV abx per ID.  F/u with PCP a couple of weeks after discharge.  I'll sign off.  Please re consult if there are questions or concerns.  [place] 03/01/2022, 9:26 AM

## 2022-03-02 DIAGNOSIS — L03115 Cellulitis of right lower limb: Secondary | ICD-10-CM | POA: Diagnosis not present

## 2022-03-02 LAB — BASIC METABOLIC PANEL
Anion gap: 6 (ref 5–15)
BUN: 12 mg/dL (ref 8–23)
CO2: 24 mmol/L (ref 22–32)
Calcium: 8 mg/dL — ABNORMAL LOW (ref 8.9–10.3)
Chloride: 108 mmol/L (ref 98–111)
Creatinine, Ser: 0.58 mg/dL (ref 0.44–1.00)
GFR, Estimated: >60 mL/min (ref >60)
Glucose, Bld: 106 mg/dL — ABNORMAL HIGH (ref 70–99)
Potassium: 3.8 mmol/L (ref 3.5–5.1)
Sodium: 138 mmol/L (ref 135–145)

## 2022-03-02 MED ORDER — FUROSEMIDE 20 MG PO TABS
20.0000 mg | ORAL_TABLET | Freq: Every day | ORAL | Status: DC
  Administered 2022-03-02 – 2022-03-04 (×3): 20 mg via ORAL
  Filled 2022-03-02 (×3): qty 1

## 2022-03-02 NOTE — Progress Notes (Signed)
[facility] PROGRESS NOTE   Candice Parks VCB:[id number] DOB: 1950/09/04 DOA: 02/27/2022  PCP: Pcp, No  Brief History/Interval Summary: 71 y.o. female with medical history significant for uterine cancer (s/p TAH and b/l SOO) on Keytruda, iron and B12 deficiency anemia, hypothyroidism who presented to the ED for evaluation of right lower extremity swelling.  She was found to have cellulitis.  CT did not raise concern for deep abscess.   Consultants: Orthopedics  Procedures: None    Subjective/Interval History: Up walking, pain still present     Assessment/Plan:  Cellulitis involving the right lower extremity Apparently this has been progressive over 2 weeks.  She had a purulent bulla over the anterior shin which increased in size.   Patient was started on vancomycin and cefepime.   CT of the lower extremity was negative for any abscess.  However she did have this blister which was quite tense and appeared to be purulent.  Orthopedic surgery (Dr. Doran Durand) was consulted to help with debridement but before they could evaluate the patient the blister popped on its own. Patient symptomatically is feeling better this morning.  WBC has improved further.  Remains afebrile.   Continue current antibiotics for now.  Follow-up on blood cultures are negative so far.  Lactic acid level was normal.  Right lower extremity Doppler study negative for DVT.   -de-escalate to Po abx tomorrow  Hypothyroidism Found to have a markedly elevated TSH of 48.59 on August 11.  Recently started on Synthroid 50 mcg daily which is being continued.  Will need to be followed up by PCP with repeat thyroid function tests in 3 to 4 weeks.  History of uterine cancer Followed by Dr. Alvy Bimler.  On active treatment with Keytruda.  Anemia secondary to iron and B12 deficiency Hemoglobin did drop slightly but stable for the most part.  No evidence for overt bleeding.  She gets iron infusions and B12 injections  at the cancer center.  DVT Prophylaxis: Lovenox  Code Status: Full code Family Communication: Discussed with patient Disposition Plan: Hopefully return home in 24-48 hours  Status is: Inpatient Remains inpatient appropriate because: Right lower extremity cellulitis requiring IV antibiotics     Medications: Scheduled:  Chlorhexidine Gluconate Cloth  6 each Topical Daily   enoxaparin (LOVENOX) injection  40 mg Subcutaneous Q24H   feeding supplement  237 mL Oral TID with meals   levothyroxine  50 mcg Oral Q0600   polyethylene glycol  17 g Oral Daily   sodium chloride flush  10-40 mL Intracatheter Q12H   Continuous:  sodium chloride Stopped (02/28/22 3846)   ceFEPime (MAXIPIME) IV 2 g (03/02/22 1056)   vancomycin 1,250 mg (03/01/22 2053)   KZL:[id number] **OR** acetaminophen, HYDROcodone-acetaminophen, lip balm, ondansetron **OR** ondansetron (ZOFRAN) IV     Objective:  Vital Signs  Vitals:   03/01/22 0345 03/01/22 1544 03/01/22 1914 03/02/22 0320  BP: (!) 94/54 111/64 100/62 (!) 99/58  Pulse: 68 61 71 65  Resp: '16 16 16 16  '$ Temp: 98.3 F (36.8 C) 98.3 F (36.8 C) 97.7 F (36.5 C) 97.6 F (36.4 C)  TempSrc: Oral  Oral Oral  SpO2: 97%  98% 100%  Weight:      Height:        Intake/Output Summary (Last 24 hours) at 03/02/2022 1249 Last data filed at 03/02/2022 0400 Gross per 24 hour  Intake 450 ml  Output --  Net 450 ml   Filed Weights   02/27/22 1800  Weight: 75 kg  General: Appearance:     Overweight female in no acute distress     Lungs:     respirations unlabored  Heart:    Normal heart rate. Normal rhythm. No murmurs, rubs, or gallops.   MS:   All extremities are intact.     Neurologic:   Awake, alert, oriented x 3. No apparent focal neurological           defect.        Lab Results:  Data Reviewed: I have personally reviewed following labs and reports of the imaging studies  CBC: Recent Labs  Lab 02/27/22 1813 02/28/22 0500  03/01/22 0532  WBC 16.0* 15.0* 9.8  NEUTROABS 14.3*  --   --   HGB 9.0* 8.6* 8.9*  HCT 29.7* 27.9* 29.0*  MCV 74.6* 74.4* 74.7*  PLT 324 309 412    Basic Metabolic Panel: Recent Labs  Lab 02/27/22 1840 02/28/22 0500 03/01/22 0532 03/02/22 0300  NA 135 135 136 138  K 3.3* 3.5 4.0 3.8  CL 101 105 106 108  CO2 '26 26 25 24  '$ GLUCOSE 148* 102* 92 106*  BUN 8 7* 8 12  CREATININE 0.85 0.70 0.72 0.58  CALCIUM 8.4* 8.1* 8.6* 8.0*  MG  --   --  1.7  --     GFR: Estimated Creatinine Clearance: 63.4 mL/min (by C-G formula based on SCr of 0.58 mg/dL).  Liver Function Tests: Recent Labs  Lab 02/27/22 1840  AST 15  ALT 12  ALKPHOS 99  BILITOT 0.4  PROT 6.7  ALBUMIN 2.6*     Radiology Studies: No results found.     LOS: 2 days   Geradine Girt  [facility] Pager on [email or web address]  03/02/2022, 12:49 PM

## 2022-03-02 NOTE — Evaluation (Signed)
Physical Therapy Evaluation Patient Details Name: Candice Parks MRN: [id number] DOB: 03-02-1951 Today's Date: 03/02/2022  History of Present Illness  71 y.o. female with medical history significant for uterine cancer (s/p TAH and b/l SOO) on Keytruda, iron and B12 deficiency anemia, hypothyroidism who presented to the ED for evaluation of right lower extremity swelling.  She was found to have cellulitis.  Clinical Impression  Pt admitted with above diagnosis. Pt ambulated 350' with RW, no loss of balance. She is mobilizing well enough to DC home.  Pt currently with functional limitations due to the deficits listed below (see PT Problem List). Pt will benefit from skilled PT to increase their independence and safety with mobility to allow discharge to the venue listed below.          Recommendations for follow up therapy are one component of a multi-disciplinary discharge planning process, led by the attending physician.  Recommendations may be updated based on patient status, additional functional criteria and insurance authorization.  Follow Up Recommendations No PT follow up      Assistance Recommended at Discharge Set up Supervision/Assistance  Patient can return home with the following  Assist for transportation;Assistance with cooking/housework    Equipment Recommendations Rolling walker (2 wheels) (pt requesting RW)  Recommendations for Other Services       Functional Status Assessment Patient has had a recent decline in their functional status and demonstrates the ability to make significant improvements in function in a reasonable and predictable amount of time.     Precautions / Restrictions Precautions Precautions: None Precaution Comments: open draining wound R lower shin Restrictions Weight Bearing Restrictions: No      Mobility  Bed Mobility Overal bed mobility: Modified Independent                  Transfers Overall transfer level: Modified  independent Equipment used: Rolling walker (2 wheels)                    Ambulation/Gait Ambulation/Gait assistance: Supervision Gait Distance (Feet): 350 Feet Assistive device: Rolling walker (2 wheels) Gait Pattern/deviations: Step-through pattern Gait velocity: WFL     General Gait Details: steady, no loss of balance, VCs to roll rather than lift RW  Stairs            Wheelchair Mobility    Modified Rankin (Stroke Patients Only)       Balance Overall balance assessment: Modified Independent                                           Pertinent Vitals/Pain Pain Assessment Pain Score: 5  Pain Location: R leg with walking Pain Descriptors / Indicators: Pressure Pain Intervention(s): Limited activity within patient's tolerance, Monitored during session, Repositioned    Home Living Family/patient expects to be discharged to:: Private residence Living Arrangements: Alone Available Help at Discharge: Family;Available PRN/intermittently;Friend(s) Type of Home: House Home Access: Level entry       Home Layout: One level Home Equipment: Rollator (4 wheels) Additional Comments: pt reports friends & family can assist prn    Prior Function Prior Level of Function : Independent/Modified Independent             Mobility Comments: walks with rollator       Hand Dominance   Dominant Hand: Right    Extremity/Trunk Assessment   Upper Extremity Assessment  Upper Extremity Assessment: Defer to OT evaluation RUE Deficits / Details: WFL ROm 5/5 strength RUE Sensation: WNL RUE Coordination: WNL LUE Deficits / Details: WFL ROM, 5/5 strength LUE Sensation: WNL LUE Coordination: WNL    Lower Extremity Assessment Lower Extremity Assessment: RLE deficits/detail RLE Deficits / Details: open draining wound R lower shin, pt able to fully extend knee, did not perform MMT 2* wound/pain RLE: Unable to fully assess due to pain RLE  Coordination: WNL    Cervical / Trunk Assessment Cervical / Trunk Assessment: Normal  Communication   Communication: No difficulties  Cognition Arousal/Alertness: Awake/alert Behavior During Therapy: WFL for tasks assessed/performed Overall Cognitive Status: Within Functional Limits for tasks assessed                                          General Comments      Exercises     Assessment/Plan    PT Assessment Patient needs continued PT services  PT Problem List Decreased mobility;Decreased activity tolerance       PT Treatment Interventions Gait training;Therapeutic exercise;Functional mobility training    PT Goals (Current goals can be found in the Care Plan section)  Acute Rehab PT Goals Patient Stated Goal: gardening PT Goal Formulation: With patient Time For Goal Achievement: 03/16/22 Potential to Achieve Goals: Good    Frequency Min 3X/week     Co-evaluation               AM-PAC PT "6 Clicks" Mobility  Outcome Measure Help needed turning from your back to your side while in a flat bed without using bedrails?: None Help needed moving from lying on your back to sitting on the side of a flat bed without using bedrails?: None Help needed moving to and from a bed to a chair (including a wheelchair)?: None Help needed standing up from a chair using your arms (e.g., wheelchair or bedside chair)?: None Help needed to walk in hospital room?: None Help needed climbing 3-5 steps with a railing? : A Little 6 Click Score: 23    End of Session Equipment Utilized During Treatment: Gait belt Activity Tolerance: Patient tolerated treatment well Patient left: in chair;with call bell/phone within reach Nurse Communication: Mobility status PT Visit Diagnosis: Difficulty in walking, not elsewhere classified (R26.2);Pain Pain - Right/Left: Right Pain - part of body: Leg    Time: 5465-0354 PT Time Calculation (min) (ACUTE ONLY): 22 min   Charges:    PT Evaluation $PT Eval Moderate Complexity: 1 Mod         Philomena Doheny PT 03/02/2022  Acute Rehabilitation Services  Office [phone] t

## 2022-03-02 NOTE — Evaluation (Signed)
Occupational Therapy Evaluation Patient Details Name: Candice Parks MRN: [id number] DOB: 11-08-1950 Today's Date: 03/02/2022   History of Present Illness 71 y.o. female with medical history significant for uterine cancer (s/p TAH and b/l SOO) on Keytruda, iron and B12 deficiency anemia, hypothyroidism who presented to the ED for evaluation of right lower extremity swelling.  She was found to have cellulitis.   Clinical Impression   Ms. Candice Parks is a 71 year old woman who presents with pain and edema in right lower extremity. On evaluation she is modified independent with ambulation and ADLs. Despite the pain patient is motivated to stay mobile and independent. She reports needing a shower chair and therapist educated on where to purchase. No further OT needs at this time.      Recommendations for follow up therapy are one component of a multi-disciplinary discharge planning process, led by the attending physician.  Recommendations may be updated based on patient status, additional functional criteria and insurance authorization.   Follow Up Recommendations  No OT follow up    Assistance Recommended at Discharge None  Patient can return home with the following      Functional Status Assessment  Patient has had a recent decline in their functional status and demonstrates the ability to make significant improvements in function in a reasonable and predictable amount of time.  Equipment Recommendations  Tub/shower seat    Recommendations for Other Services       Precautions / Restrictions Precautions Precautions: None Restrictions Weight Bearing Restrictions: No      Mobility Bed Mobility Overal bed mobility: Modified Independent                  Transfers Overall transfer level: Modified independent                        Balance Overall balance assessment: Mild deficits observed, not formally tested                                          ADL either performed or assessed with clinical judgement   ADL Overall ADL's : Modified independent                                       General ADL Comments: using walker or IV pole to ambulate.     Vision Patient Visual Report: No change from baseline       Perception     Praxis      Pertinent Vitals/Pain Pain Assessment Pain Assessment: 0-10 Pain Score: 3  Pain Location: R leg Pain Descriptors / Indicators: Tightness Pain Intervention(s): Monitored during session, Premedicated before session     Hand Dominance Right   Extremity/Trunk Assessment Upper Extremity Assessment Upper Extremity Assessment: RUE deficits/detail;LUE deficits/detail RUE Deficits / Details: WFL ROm 5/5 strength RUE Sensation: WNL RUE Coordination: WNL LUE Deficits / Details: WFL ROM, 5/5 strength LUE Sensation: WNL LUE Coordination: WNL   Lower Extremity Assessment Lower Extremity Assessment: Defer to PT evaluation   Cervical / Trunk Assessment Cervical / Trunk Assessment: Normal   Communication Communication Communication: No difficulties   Cognition Arousal/Alertness: Awake/alert Behavior During Therapy: WFL for tasks assessed/performed Overall Cognitive Status: Within Functional Limits for tasks assessed  General Comments       Exercises     Shoulder Instructions      Home Living Family/patient expects to be discharged to:: Private residence Living Arrangements: Alone Available Help at Discharge: Family;Available PRN/intermittently Type of Home: House Home Access: Level entry     Home Layout: One level     Bathroom Shower/Tub: Occupational psychologist: Handicapped height     Home Equipment: Rollator (4 wheels)          Prior Functioning/Environment Prior Level of Function : Independent/Modified Independent                        OT Problem List: Pain       OT Treatment/Interventions:      OT Goals(Current goals can be found in the care plan section) Acute Rehab OT Goals OT Goal Formulation: All assessment and education complete, DC therapy  OT Frequency:      Co-evaluation              AM-PAC OT "6 Clicks" Daily Activity     Outcome Measure Help from another person eating meals?: None Help from another person taking care of personal grooming?: None Help from another person toileting, which includes using toliet, bedpan, or urinal?: None Help from another person bathing (including washing, rinsing, drying)?: None Help from another person to put on and taking off regular upper body clothing?: None Help from another person to put on and taking off regular lower body clothing?: None 6 Click Score: 24   End of Session Equipment Utilized During Treatment: Rolling walker (2 wheels) Nurse Communication: Other (comment) (okay to see)  Activity Tolerance: Patient tolerated treatment well Patient left: Other (comment) (in bathroom)  OT Visit Diagnosis: Pain                Time: 0981-1914 OT Time Calculation (min): 12 min Charges:  OT General Charges $OT Visit: 1 Visit OT Evaluation $OT Eval Low Complexity: 1 Low  Tamsyn Owusu, OTR/L [place]  Office [phone] Pager: [place] 03/02/2022, 9:17 AM

## 2022-03-02 NOTE — Consult Note (Addendum)
[facility] Nurse Consult Note: Reason for Consult: Consult requested for right leg.  Pt was evaluated by ortho team on 8/22 for cellulitis.  [facility] team requested to provide topical treatment order recommendations. Pt is on systemic antibiotics to promote healing. Wound type: Right upper calf with dry black intact scab from previous wound which occurred prior to the cellulitis; 3X3cm, no open wound, drainage, or fluctuance. Measurement: Right anterior lower calf with loose blistered peeling skin and generalized edema.  Removed loose outer skin with yellow gelatinous layer underneath, using a moist washcloth, revealing 80% red, 20% yellow moist full thickness wound, 10X10X.2cm, mod amt yellow drainage, no odor or fluctuance. Pt tolerated with minimal amt discomfort.  Dressing procedure/placement/frequency: Topical treatment orders provided for bedside nurses to perform as follows to absorb drainage and provide light compression:  1. Apply dressing to right leg Q day as follows: Xeroform gauze ti right calf wound Q day, then cover with ABD pad.  Apply kerlex, beginning just behind toes, to below knee, then ace wrap in the same manner.  2. Foam dressing to right upper calf, change Q 3 days or PRN soiling. Please re-consult if further assistance is needed.  Thank-you,  Julien Girt MSN, [place], [place], [place], [place]

## 2022-03-02 NOTE — TOC Transition Note (Signed)
Transition of Care [facility]) - CM/SW Discharge Note   Patient Details  Name: Candice Parks MRN: [id number] Date of Birth: 05/13/1951  Transition of [facility] CM/SW Contact:  Vassie Moselle, LCSW Phone Number: 03/02/2022, 3:00 PM   Clinical Narrative:    Met with pt and confirmed need for rolling walker. RW has been ordered through [employer] and will be delivered to pt's room. Pt also recommended for shower seat however, will have to pay privately for this as insurance will not cover it. [employer] made aware of interest in shower seat and will be able to provide this DME if pt is able to privately pay. No further TOC needs identified at this time. Please consult TOC should further needs arise.   Final next level of care: Home/Self Care Barriers to Discharge: No Barriers Identified   Patient Goals and CMS Choice Patient states their goals for this hospitalization and ongoing recovery are:: To return home   Choice offered to / list presented to : Patient  Discharge Placement                       Discharge Plan and Services In-house Referral: Clinical Social Work Discharge Planning Services: NA Post Acute Care Choice: Durable Medical Equipment          DME Arranged: Gilford Rile rolling DME Agency: [employer] Date DME Agency Contacted: 03/02/22 Time DME Agency Contacted: 1500 Representative spoke with at DME Agency: [place] ([place]) Interventions     Readmission Risk Interventions    03/02/2022    2:59 PM  Readmission Risk Prevention Plan  Post Dischage Appt Complete  Medication Screening Complete  Transportation Screening Complete

## 2022-03-02 NOTE — Progress Notes (Signed)
Pharmacy Antibiotic Note  Candice Parks is a 71 y.o. female admitted on 02/27/2022 with worsening RLE cellulitis.  Pharmacy has been consulted for Vancomycin + Cefepime dosing.  Day 4 antibiotics WBC improved to WNL Afebrile No Cx data Blister burst on its own, no further management per ortho  Plan: Continue cefepime 2gm IV q8h Continue vancomycin '1250mg'$  IV q24h to target AUC 400-550.  Estimated AUC =496. Monitor renal function and cx data  Recommend switch IV abxs to PO Augmentin/Doxy for remainder of course to complete 7-10 days total  Height: '5\' 3"'$  (160 cm) Weight: 75 kg (165 lb 5.5 oz) IBW/kg (Calculated) : 52.4  Temp (24hrs), Avg:97.9 F (36.6 C), Min:97.6 F (36.4 C), Max:98.3 F (36.8 C)  Recent Labs  Lab 02/27/22 1813 02/27/22 1840 02/27/22 2045 02/28/22 0500 03/01/22 0532 03/02/22 0300  WBC 16.0*  --   --  15.0* 9.8  --   CREATININE  --  0.85  --  0.70 0.72 0.58  LATICACIDVEN  --  1.6 0.8  --   --   --      Estimated Creatinine Clearance: 63.4 mL/min (by C-G formula based on SCr of 0.58 mg/dL).    No Known Allergies  Antimicrobials this admission: 8/20 Zosyn x1 8/20 Vancomycin >>  8/21 Cefepime >>   Dose adjustments this admission:  Microbiology results: 8/20 BCx: ngtd  Thank you for allowing pharmacy to be a part of this patient's care.  Adrian Saran, PharmD, BCPS Secure Chat if ?s 03/02/2022 8:36 AM

## 2022-03-03 DIAGNOSIS — L03115 Cellulitis of right lower limb: Secondary | ICD-10-CM | POA: Diagnosis not present

## 2022-03-03 MED ORDER — DOXYCYCLINE HYCLATE 100 MG PO TABS
100.0000 mg | ORAL_TABLET | Freq: Two times a day (BID) | ORAL | Status: DC
  Administered 2022-03-03 – 2022-03-04 (×2): 100 mg via ORAL
  Filled 2022-03-03 (×2): qty 1

## 2022-03-03 MED ORDER — AMOXICILLIN-POT CLAVULANATE 875-125 MG PO TABS
1.0000 | ORAL_TABLET | Freq: Two times a day (BID) | ORAL | Status: DC
  Administered 2022-03-03 – 2022-03-04 (×2): 1 via ORAL
  Filled 2022-03-03 (×2): qty 1

## 2022-03-03 NOTE — Progress Notes (Signed)
Mobility Specialist - Progress Note   03/03/22 0919  Mobility  Activity Ambulated with assistance in hallway  Level of Assistance Standby assist, set-up cues, supervision of patient - no hands on  Assistive Device Four wheel walker  Distance Ambulated (ft) 1000 ft  Activity Response Tolerated well  $Mobility charge 1 Mobility   Pt received in chair and agreed to mobilize. No c/o dizziness or SOB. Felt "pressure" when ambulating in RLE. Pt left in chair with all needs met and call bell within reach.   Roderick Pee Mobility Specialist

## 2022-03-03 NOTE — Progress Notes (Signed)
Mobility Specialist - Progress Note   03/03/22 1300  Mobility  Activity Ambulated with assistance in hallway  Level of Assistance Modified independent, requires aide device or extra time  Assistive Device Front wheel walker  Distance Ambulated (ft) 1000 ft  Activity Response Tolerated well  $Mobility charge 1 Mobility   Pt received in chair and agreed to mobilize. No c/o pain or discomfort, claimed to "forget about her leg because of our great walk". Pt left in bed with call bell within reach and all necessities taken care of.   Roderick Pee Mobility Specialist

## 2022-03-03 NOTE — Progress Notes (Signed)
[facility] PROGRESS NOTE   Candice Parks HER:[id number] DOB: [birth year] DOA: 02/27/2022  PCP: Pcp, No  Brief History/Interval Summary: 71 y.o. female with medical history significant for uterine cancer (s/p TAH and b/l SOO) on Keytruda, iron and B12 deficiency anemia, hypothyroidism who presented to the ED for evaluation of right lower extremity swelling.  She was found to have cellulitis.  CT did not raise concern for deep abscess.  Slowly improving  Consultants: Orthopedics  Procedures: None    Subjective/Interval History: Feeling better    Assessment/Plan:  Cellulitis involving the right lower extremity Apparently this has been progressive over 2 weeks.  She had a purulent bulla over the anterior shin which increased in size.   Patient was started on vancomycin and cefepime.   CT of the lower extremity was negative for any abscess.  However she did have this blister which was quite tense and appeared to be purulent.  Orthopedic surgery (Dr. Doran Durand) was consulted to help with debridement but before they could evaluate the patient the blister popped on its own. Patient symptomatically is feeling better this morning.  WBC has improved further.  Remains afebrile.   -blood cultures are negative so far.  Lactic acid level was normal.  Right lower extremity Doppler study negative for DVT.   -de-escalate to Po abx and hope for d/c home in AM  Hypothyroidism Found to have a markedly elevated TSH of 48.59 on August 11.  Recently started on Synthroid 50 mcg daily which is being continued.  Will need to be followed up by PCP with repeat thyroid function tests in 3 to 4 weeks.  History of uterine cancer Followed by Dr. Alvy Bimler.  On active treatment with Keytruda.  Anemia secondary to iron and B12 deficiency Hemoglobin did drop slightly but stable for the most part.  No evidence for overt bleeding.  She gets iron infusions and B12 injections at the cancer center.  DVT  Prophylaxis: Lovenox  Code Status: Full code Family Communication: Discussed with patient Disposition Plan: home in AM     Medications: Scheduled:  amoxicillin-clavulanate  1 tablet Oral BID WC   Chlorhexidine Gluconate Cloth  6 each Topical Daily   doxycycline  100 mg Oral Q12H   enoxaparin (LOVENOX) injection  40 mg Subcutaneous Q24H   feeding supplement  237 mL Oral TID with meals   furosemide  20 mg Oral Daily   levothyroxine  50 mcg Oral Q0600   polyethylene glycol  17 g Oral Daily   sodium chloride flush  10-40 mL Intracatheter Q12H   Continuous:  sodium chloride Stopped (02/28/22 8563)   JSH:FWYOVZCHYIFOY **OR** acetaminophen, HYDROcodone-acetaminophen, lip balm, ondansetron **OR** ondansetron (ZOFRAN) IV     Objective:  Vital Signs  Vitals:   03/02/22 1630 03/02/22 1957 03/03/22 0459 03/03/22 1000  BP: 104/66 (!) 100/55 108/61 112/63  Pulse: 71 72 71   Resp: '20 16 16   '$ Temp: 97.8 F (36.6 C) 98 F (36.7 C) 97.7 F (36.5 C)   TempSrc:  Oral Oral   SpO2: 100% 100% 100%   Weight:      Height:        Intake/Output Summary (Last 24 hours) at 03/03/2022 1126 Last data filed at 03/03/2022 0801 Gross per 24 hour  Intake 757.56 ml  Output --  Net 757.56 ml   Filed Weights   02/27/22 1800  Weight: 75 kg     General: Appearance:     Overweight female in no acute distress  Lungs:     respirations unlabored  Heart:    Normal heart rate. Normal rhythm. No murmurs, rubs, or gallops.   MS:   All extremities are intact.    Neurologic:   Awake, alert, oriented x 3. No apparent focal neurological           defect.        Lab Results:  Data Reviewed: I have personally reviewed following labs and reports of the imaging studies  CBC: Recent Labs  Lab 02/27/22 1813 02/28/22 0500 03/01/22 0532  WBC 16.0* 15.0* 9.8  NEUTROABS 14.3*  --   --   HGB 9.0* 8.6* 8.9*  HCT 29.7* 27.9* 29.0*  MCV 74.6* 74.4* 74.7*  PLT 324 309 671    Basic Metabolic  Panel: Recent Labs  Lab 02/27/22 1840 02/28/22 0500 03/01/22 0532 03/02/22 0300  NA 135 135 136 138  K 3.3* 3.5 4.0 3.8  CL 101 105 106 108  CO2 '26 26 25 24  '$ GLUCOSE 148* 102* 92 106*  BUN 8 7* 8 12  CREATININE 0.85 0.70 0.72 0.58  CALCIUM 8.4* 8.1* 8.6* 8.0*  MG  --   --  1.7  --     GFR: Estimated Creatinine Clearance: 63.4 mL/min (by C-G formula based on SCr of 0.58 mg/dL).  Liver Function Tests: Recent Labs  Lab 02/27/22 1840  AST 15  ALT 12  ALKPHOS 99  BILITOT 0.4  PROT 6.7  ALBUMIN 2.6*     Radiology Studies: No results found.     LOS: 3 days   Geradine Girt  [facility] Pager on [email or web address]  03/03/2022, 11:26 AM

## 2022-03-03 NOTE — Care Management Important Message (Signed)
Important Message  Patient Details IM Letter given to the Patient. Name: Candice Parks MRN: [id number] Date of Birth: Jun 30, 1951   Medicare Important Message Given:  Yes     Kerin Salen 03/03/2022, 9:13 AM

## 2022-03-04 DIAGNOSIS — L03115 Cellulitis of right lower limb: Secondary | ICD-10-CM | POA: Diagnosis not present

## 2022-03-04 MED ORDER — HEPARIN SOD (PORK) LOCK FLUSH 100 UNIT/ML IV SOLN
500.0000 [IU] | INTRAVENOUS | Status: AC | PRN
  Administered 2022-03-04: 500 [IU]
  Filled 2022-03-04: qty 5

## 2022-03-04 MED ORDER — AMOXICILLIN-POT CLAVULANATE 875-125 MG PO TABS: 1.0000 | ORAL_TABLET | Freq: Two times a day (BID) | ORAL | 0 refills | Status: AC

## 2022-03-04 MED ORDER — DOXYCYCLINE HYCLATE 100 MG PO TABS: 100.0000 mg | ORAL_TABLET | Freq: Two times a day (BID) | ORAL | 0 refills | Status: DC

## 2022-03-04 NOTE — Progress Notes (Signed)
AVS reviewed with pt; dressing changed, education on dressing changes provided.  Pt stated she intends to find a PCP to f/u with asap.  Medications reviewed.  Port deaccessed by IV team, clean and dry.  No questions or concerns at time of departure.  Transported by friends via private vehicle.  Sent with all belongings, including new FWW and wound care supplies.

## 2022-03-04 NOTE — Discharge Instructions (Signed)
Establish with PCP

## 2022-03-04 NOTE — Discharge Summary (Signed)
Physician Discharge Summary  Caera Enwright ZWC:[id number] DOB: 07-25-1950 DOA: 02/27/2022  PCP: Pcp, No  Admit date: 02/27/2022 Discharge date: 03/04/2022  Admitted From: home Discharge disposition: home   Recommendations for Outpatient Follow-Up:   Wound care Follow up with PCP To ensure resolution of wound repeat thyroid function tests in 3 to 4 weeks.   Discharge Diagnosis:   Principal Problem:   Cellulitis of right lower extremity Active Problems:   Uterine cancer (HCC)   Deficiency anemia   Hypothyroidism    Discharge Condition: Improved.  Diet recommendation:  Regular.  Wound care: None.  Code status: Full.   History of Present Illness:   Candice Parks is a 71 y.o. female with medical history significant for uterine cancer (s/p TAH and b/l SOO) on Keytruda, iron and B12 deficiency anemia, hypothyroidism who presented to the ED for evaluation of right lower extremity swelling.   Patient reports progressive swelling of her right lower extremity for the last 2 weeks.  Around the time the swelling began she noticed what she thought was a mosquito bite on her anterior upper shin area.  Over time she has noticed erythema from above the ankle to two thirds up the shin.  This morning she noticed a small yellow fluid-filled blister halfway up the anterior part of her shin.  This has increased in size since this morning but blister has remained intact.  She has not had any active discharge.  She is having associated pain and somewhat limited mobility due to swelling.   She denies any subjective fevers, chills, diaphoresis, chest pain, dyspnea, abdominal pain, nausea, vomiting.   Hospital Course by Problem:   Cellulitis involving the right lower extremity Apparently this has been progressive over 2 weeks.  She had a purulent bulla over the anterior shin which increased in size.   Patient was started on vancomycin and cefepime.   CT of the lower  extremity was negative for any abscess.  However she did have this blister which was quite tense and appeared to be purulent.  Orthopedic surgery (Dr. Doran Durand) was consulted to help with debridement but before they could evaluate the patient the blister popped on its own. Patient symptomatically is feeling better this morning.  WBC has improved further.  Remains afebrile.   -blood cultures are negative so far.  Lactic acid level was normal.  Right lower extremity Doppler study negative for DVT.   -de-escalate to Po abx   Hypothyroidism Found to have a markedly elevated TSH of 48.59 on August 11.  Recently started on Synthroid 50 mcg daily which is being continued.  Will need to be followed up by PCP with repeat thyroid function tests in 3 to 4 weeks.   History of uterine cancer Followed by Dr. Alvy Bimler.  On active treatment with Keytruda.   Anemia secondary to iron and B12 deficiency Hemoglobin did drop slightly but stable for the most part.  No evidence for overt bleeding.  She gets iron infusions and B12 injections at the cancer center    Medical Consultants:    ortho  Discharge Exam:   Vitals:   03/04/22 0602 03/04/22 0752  BP: 126/72 118/67  Pulse: 68 74  Resp: 19 20  Temp: 98.1 F (36.7 C) 97.8 F (36.6 C)  SpO2: 100% 100%   Vitals:   03/03/22 1248 03/03/22 2137 03/04/22 0602 03/04/22 0752  BP: 125/75 125/75 126/72 118/67  Pulse: 79 73 68 74  Resp: 18 16 19  20  Temp: 98.3 F (36.8 C) 98.1 F (36.7 C) 98.1 F (36.7 C) 97.8 F (36.6 C)  TempSrc: Oral Oral Oral Oral  SpO2: 100% 100% 100% 100%  Weight:      Height:        General exam: Appears calm and comfortable. Pain improved and wants to go home  The results of significant diagnostics from this hospitalization (including imaging, microbiology, ancillary and laboratory) are listed below for reference.     Procedures and Diagnostic Studies:   VAS [place] LOWER EXTREMITY VENOUS (DVT)  Result Date: 03/01/2022   Lower Venous DVT Study Patient Name:  YALONDA SAMPLE  Date of Exam:   02/28/2022 Medical Rec #: [id number]            Accession #:    [id number] Date of Birth: Dec 09, 1950            Patient Gender: F Patient Age:   43 years Exam Location:  [facility] Procedure:      VAS [place] LOWER EXTREMITY VENOUS (DVT) Referring Phys: Roxanne Mins PATEL --------------------------------------------------------------------------------  Indications: Pain, swelling, warmth (cellulitis).  Limitations: Body habitus and poor ultrasound/tissue interface. Comparison Study: No previous exams Performing Technologist: Jody Hill RVT, RDMS  Examination Guidelines: A complete evaluation includes B-mode imaging, spectral Doppler, color Doppler, and power Doppler as needed of all accessible portions of each vessel. Bilateral testing is considered an integral part of a complete examination. Limited examinations for reoccurring indications may be performed as noted. The reflux portion of the exam is performed with the patient in reverse Trendelenburg.  +--------+---------------+---------+-----------+----------+--------------------+ RIGHT   CompressibilityPhasicitySpontaneityPropertiesThrombus Aging       +--------+---------------+---------+-----------+----------+--------------------+ CFV     Full           Yes      Yes                                       +--------+---------------+---------+-----------+----------+--------------------+ SFJ     Full                                                              +--------+---------------+---------+-----------+----------+--------------------+ FV Prox Full           Yes      Yes                                       +--------+---------------+---------+-----------+----------+--------------------+ FV Mid  Full           Yes      Yes                                       +--------+---------------+---------+-----------+----------+--------------------+ FV      Full            Yes      Yes                                       Distal                                                                    +--------+---------------+---------+-----------+----------+--------------------+  PFV     Full                                                              +--------+---------------+---------+-----------+----------+--------------------+ POP     Full           Yes      Yes                                       +--------+---------------+---------+-----------+----------+--------------------+ PTV                    Yes      Yes                  patent by                                                                 color/doppler        +--------+---------------+---------+-----------+----------+--------------------+ PERO                                                 Not well visualized  +--------+---------------+---------+-----------+----------+--------------------+   +----+---------------+---------+-----------+----------+--------------+ LEFTCompressibilityPhasicitySpontaneityPropertiesThrombus Aging +----+---------------+---------+-----------+----------+--------------+ CFV Full           Yes      Yes                                 +----+---------------+---------+-----------+----------+--------------+    Summary: RIGHT: - There is no evidence of deep vein thrombosis in the lower extremity.  - Ultrasound characteristics of enlarged lymph nodes are noted in the groin. Diffuse subcutaneous edema  LEFT: - No evidence of common femoral vein obstruction. - Ultrasound characteristics of enlarged lymph nodes noted in the groin.  *See table(s) above for measurements and observations. Electronically signed by Servando Snare MD on 03/01/2022 at 2:11:08 PM.    Final    CT EXTREMITY LOWER RIGHT W CONTRAST  Result Date: 02/27/2022 CLINICAL DATA:  Soft tissue mass, lower leg, deep. Possible bug bite to right upper shin 2 weeks ago. Worsening  leg swelling. EXAM: CT OF THE LOWER RIGHT EXTREMITY WITH CONTRAST TECHNIQUE: Multidetector CT imaging of the lower right extremity was performed according to the standard protocol following intravenous contrast administration. RADIATION DOSE REDUCTION: This exam was performed according to the departmental dose-optimization program which includes automated exposure control, adjustment of the mA and/or kV according to patient size and/or use of iterative reconstruction technique. CONTRAST:  55m OMNIPAQUE IOHEXOL 300 MG/ML  SOLN COMPARISON:  None Available. FINDINGS: Bones/Joint/Cartilage No acute fracture or dislocation. Moderate-to-severe tricompartmental degenerative changes, most pronounced in the medial compartment. A subchondral defect is noted in the talar dome laterally, possible osteochondral defect. Degenerative changes are present at the ankle. Mild calcaneal spurring. No joint effusion at the knee. Ligaments Suboptimally assessed  by CT. Muscles and Tendons No abnormal enhancement or abscess. Mild intramuscular edema is noted. Soft tissues Diffuse subcutaneous soft tissue swelling and edema are noted. No subcutaneous air. There is a 2.5 cm blister along the anterior aspect of the mid tibia. No abscess is identified. Focal fluid collections are noted in the popliteal fossa, possible Baker's cysts. Calcifications are noted in the Baker's cyst, possible loose bodies. IMPRESSION: 1. Diffuse subcutaneous edema and fat stranding. No rim enhancing fluid collection is seen to suggest abscess. 2. Blister along the anterior aspect of the mid tibia. 3. Diffuse intramuscular edema without evidence of abnormal enhancement or subcutaneous air. 4. Moderate-to-severe degenerative changes at the knee. 5. Fluid collections in the popliteal fossa containing calcifications, possible Baker's cysts with loose bodies. Electronically Signed   By: Brett Fairy M.D.   On: 02/27/2022 22:30   DG Chest Port 1 View  Result Date:  02/27/2022 CLINICAL DATA:  Shortness of breath, pain and swelling right lower leg EXAM: PORTABLE CHEST 1 VIEW COMPARISON:  None Available. FINDINGS: Transverse diameter of heart is increased. There are no signs of pulmonary edema or focal pulmonary consolidation. There is no pleural effusion or pneumothorax. Tip of right IJ chest port is seen in superior vena cava. IMPRESSION: Cardiomegaly. There are no signs of pulmonary edema or focal pulmonary consolidation. Electronically Signed   By: Elmer Picker M.D.   On: 02/27/2022 19:23     Labs:   Basic Metabolic Panel: Recent Labs  Lab 02/27/22 1840 02/28/22 0500 03/01/22 0532 03/02/22 0300  NA 135 135 136 138  K 3.3* 3.5 4.0 3.8  CL 101 105 106 108  CO2 '26 26 25 24  '$ GLUCOSE 148* 102* 92 106*  BUN 8 7* 8 12  CREATININE 0.85 0.70 0.72 0.58  CALCIUM 8.4* 8.1* 8.6* 8.0*  MG  --   --  1.7  --    GFR Estimated Creatinine Clearance: 63.4 mL/min (by C-G formula based on SCr of 0.58 mg/dL). Liver Function Tests: Recent Labs  Lab 02/27/22 1840  AST 15  ALT 12  ALKPHOS 99  BILITOT 0.4  PROT 6.7  ALBUMIN 2.6*   No results for input(s): "LIPASE", "AMYLASE" in the last 168 hours. No results for input(s): "AMMONIA" in the last 168 hours. Coagulation profile No results for input(s): "INR", "PROTIME" in the last 168 hours.  CBC: Recent Labs  Lab 02/27/22 1813 02/28/22 0500 03/01/22 0532  WBC 16.0* 15.0* 9.8  NEUTROABS 14.3*  --   --   HGB 9.0* 8.6* 8.9*  HCT 29.7* 27.9* 29.0*  MCV 74.6* 74.4* 74.7*  PLT 324 309 340   Cardiac Enzymes: No results for input(s): "CKTOTAL", "CKMB", "CKMBINDEX", "TROPONINI" in the last 168 hours. BNP: Invalid input(s): "POCBNP" CBG: No results for input(s): "GLUCAP" in the last 168 hours. D-Dimer No results for input(s): "DDIMER" in the last 72 hours. Hgb A1c No results for input(s): "HGBA1C" in the last 72 hours. Lipid Profile No results for input(s): "CHOL", "HDL", "LDLCALC", "TRIG",  "CHOLHDL", "LDLDIRECT" in the last 72 hours. Thyroid function studies No results for input(s): "TSH", "T4TOTAL", "T3FREE", "THYROIDAB" in the last 72 hours.  Invalid input(s): "FREET3" Anemia work up No results for input(s): "VITAMINB12", "FOLATE", "FERRITIN", "TIBC", "IRON", "RETICCTPCT" in the last 72 hours. Microbiology Recent Results (from the past 240 hour(s))  Culture, blood (Routine X 2) w Reflex to ID Panel     Status: None (Preliminary result)   Collection Time: 02/27/22  6:13 PM   Specimen: BLOOD  Result Value Ref Range Status   Specimen Description   Final    BLOOD LEFT ANTECUBITAL Performed at [facility] [address], [place], [place] [address]    Special Requests   Final    BOTTLES DRAWN AEROBIC AND ANAEROBIC Blood Culture adequate volume Performed at [facility] [address], [place] [address]    Culture   Final    NO GROWTH 4 DAYS Performed at [facility], [place] [address] [place], [place] [address]    Report Status PENDING  Incomplete  Culture, blood (Routine X 2) w Reflex to ID Panel     Status: None (Preliminary result)   Collection Time: 02/27/22  6:18 PM   Specimen: BLOOD  Result Value Ref Range Status   Specimen Description   Final    BLOOD PORTA CATH Performed at [facility] [address] [place], [place] [address]    Special Requests   Final    BOTTLES DRAWN AEROBIC AND ANAEROBIC Blood Culture results may not be optimal due to an excessive volume of blood received in culture bottles Performed at [facility] [address], [place], [place] [address]    Culture   Final    NO GROWTH 4 DAYS Performed at [facility], [place] [address], [place], [place] [address]    Report Status PENDING  Incomplete     Discharge Instructions:   Discharge Instructions     Diet general   Complete by: As directed    Discharge wound care:   Complete by: As  directed    Apply dressing to right leg Q day as follows: Xeroform gauze to right calf wound Q day, then cover with ABD pad.  Apply kerlex, beginning just behind toes, to below knee, then ace wrap in the same manner.  Foam dressing to right upper calf, change Q 3 days or PRN soiling   Increase activity slowly   Complete by: As directed       Allergies as of 03/04/2022   No Known Allergies      Medication List     STOP taking these medications    lidocaine-prilocaine cream Commonly known as: EMLA       TAKE these medications    acetaminophen 325 MG tablet Commonly known as: TYLENOL Take 650 mg by mouth at bedtime as needed (pain).   amoxicillin-clavulanate 875-125 MG tablet Commonly known as: AUGMENTIN Take 1 tablet by mouth 2 (two) times daily with a meal for 4 days.   doxycycline 100 MG tablet Commonly known as: VIBRA-TABS Take 1 tablet (100 mg total) by mouth every 12 (twelve) hours.   furosemide 20 MG tablet Commonly known as: LASIX TAKE 1 TABLET (20 MG TOTAL) BY MOUTH IN THE MORNING What changed: when to take this   levothyroxine 50 MCG tablet Commonly known as: Synthroid Take 1 tablet (50 mcg total) by mouth daily before breakfast.   magnesium hydroxide 400 MG/5ML suspension Commonly known as: MILK OF MAGNESIA Take 30 mLs by mouth daily.   morphine 15 MG tablet Commonly known as: MSIR Take 1 tablet by mouth every 6 hours as needed for severe pain. What changed: when to take this   polyethylene glycol 17 g packet Commonly known as: MIRALAX / GLYCOLAX Take 17 g by mouth daily. What changed:  when to take this reasons to take this  Durable Medical Equipment  (From admission, onward)           Start     Ordered   03/02/22 1241  For home use only DME Walker rolling  Once       Question Answer Comment  Walker: With [place] Wheels   Patient needs a walker to treat with the following condition Difficulty in walking, not  elsewhere classified      03/02/22 1240              Discharge Care Instructions  (From admission, onward)           Start     Ordered   03/04/22 0000  Discharge wound care:       Comments: Apply dressing to right leg Q day as follows: Xeroform gauze to right calf wound Q day, then cover with ABD pad.  Apply kerlex, beginning just behind toes, to below knee, then ace wrap in the same manner.  Foam dressing to right upper calf, change Q 3 days or PRN soiling   03/04/22 0832              Time coordinating discharge: 45 min  Signed:  Geradine Girt DO  [facility] 03/04/2022, 8:32 AM

## 2022-03-05 LAB — CULTURE, BLOOD (ROUTINE X 2)
Culture: NO GROWTH
Culture: NO GROWTH
Special Requests: ADEQUATE

## 2022-03-11 ENCOUNTER — Encounter: Payer: Self-pay | Admitting: Hematology and Oncology

## 2022-03-11 ENCOUNTER — Inpatient Hospital Stay (HOSPITAL_BASED_OUTPATIENT_CLINIC_OR_DEPARTMENT_OTHER): Payer: Medicare PPO | Admitting: Hematology and Oncology

## 2022-03-11 ENCOUNTER — Other Ambulatory Visit: Payer: Self-pay | Admitting: Hematology and Oncology

## 2022-03-11 ENCOUNTER — Inpatient Hospital Stay: Payer: Medicare PPO

## 2022-03-11 ENCOUNTER — Inpatient Hospital Stay: Payer: Medicare PPO | Attending: Gynecologic Oncology

## 2022-03-11 ENCOUNTER — Other Ambulatory Visit: Payer: Self-pay

## 2022-03-11 ENCOUNTER — Other Ambulatory Visit (HOSPITAL_COMMUNITY): Payer: Self-pay

## 2022-03-11 VITALS — BP 117/75 | HR 78 | Resp 16

## 2022-03-11 DIAGNOSIS — L03115 Cellulitis of right lower limb: Secondary | ICD-10-CM | POA: Diagnosis not present

## 2022-03-11 DIAGNOSIS — G893 Neoplasm related pain (acute) (chronic): Secondary | ICD-10-CM

## 2022-03-11 DIAGNOSIS — C55 Malignant neoplasm of uterus, part unspecified: Secondary | ICD-10-CM

## 2022-03-11 DIAGNOSIS — D539 Nutritional anemia, unspecified: Secondary | ICD-10-CM

## 2022-03-11 DIAGNOSIS — Z5112 Encounter for antineoplastic immunotherapy: Secondary | ICD-10-CM | POA: Diagnosis not present

## 2022-03-11 DIAGNOSIS — Z90722 Acquired absence of ovaries, bilateral: Secondary | ICD-10-CM | POA: Insufficient documentation

## 2022-03-11 DIAGNOSIS — Z90710 Acquired absence of both cervix and uterus: Secondary | ICD-10-CM | POA: Diagnosis not present

## 2022-03-11 DIAGNOSIS — D61818 Other pancytopenia: Secondary | ICD-10-CM | POA: Insufficient documentation

## 2022-03-11 DIAGNOSIS — Z79899 Other long term (current) drug therapy: Secondary | ICD-10-CM | POA: Insufficient documentation

## 2022-03-11 DIAGNOSIS — Z9079 Acquired absence of other genital organ(s): Secondary | ICD-10-CM | POA: Insufficient documentation

## 2022-03-11 DIAGNOSIS — Z923 Personal history of irradiation: Secondary | ICD-10-CM | POA: Insufficient documentation

## 2022-03-11 LAB — CMP (CANCER CENTER ONLY)
ALT: 14 U/L (ref 0–44)
AST: 15 U/L (ref 15–41)
Albumin: 3.5 g/dL (ref 3.5–5.0)
Alkaline Phosphatase: 91 U/L (ref 38–126)
Anion gap: 5 (ref 5–15)
BUN: 11 mg/dL (ref 8–23)
CO2: 28 mmol/L (ref 22–32)
Calcium: 9 mg/dL (ref 8.9–10.3)
Chloride: 105 mmol/L (ref 98–111)
Creatinine: 0.59 mg/dL (ref 0.44–1.00)
GFR, Estimated: >60 mL/min (ref >60)
Glucose, Bld: 110 mg/dL — ABNORMAL HIGH (ref 70–99)
Potassium: 3.6 mmol/L (ref 3.5–5.1)
Sodium: 138 mmol/L (ref 135–145)
Total Bilirubin: 0.3 mg/dL (ref 0.3–1.2)
Total Protein: 7.7 g/dL (ref 6.5–8.1)

## 2022-03-11 LAB — TSH: TSH: 49.366 u[IU]/mL — ABNORMAL HIGH (ref 0.350–4.500)

## 2022-03-11 LAB — SAMPLE TO BLOOD BANK

## 2022-03-11 LAB — CBC WITH DIFFERENTIAL (CANCER CENTER ONLY)
Abs Immature Granulocytes: 0.03 10*3/uL (ref 0.00–0.07)
Basophils Absolute: 0 10*3/uL (ref 0.0–0.1)
Basophils Relative: 1 %
Eosinophils Absolute: 0.1 10*3/uL (ref 0.0–0.5)
Eosinophils Relative: 2 %
HCT: 29.7 % — ABNORMAL LOW (ref 36.0–46.0)
Hemoglobin: 9.3 g/dL — ABNORMAL LOW (ref 12.0–15.0)
Immature Granulocytes: 1 %
Lymphocytes Relative: 21 %
Lymphs Abs: 1 10*3/uL (ref 0.7–4.0)
MCH: 22.7 pg — ABNORMAL LOW (ref 26.0–34.0)
MCHC: 31.3 g/dL (ref 30.0–36.0)
MCV: 72.4 fL — ABNORMAL LOW (ref 80.0–100.0)
Monocytes Absolute: 0.3 10*3/uL (ref 0.1–1.0)
Monocytes Relative: 7 %
Neutro Abs: 3.2 10*3/uL (ref 1.7–7.7)
Neutrophils Relative %: 68 %
Platelet Count: 469 10*3/uL — ABNORMAL HIGH (ref 150–400)
RBC: 4.1 MIL/uL (ref 3.87–5.11)
RDW: 19.9 % — ABNORMAL HIGH (ref 11.5–15.5)
WBC Count: 4.7 10*3/uL (ref 4.0–10.5)
nRBC: 0 % (ref 0.0–0.2)

## 2022-03-11 MED ORDER — SODIUM CHLORIDE 0.9% FLUSH
10.0000 mL | INTRAVENOUS | Status: DC | PRN
  Administered 2022-03-11: 10 mL

## 2022-03-11 MED ORDER — LEVOTHYROXINE SODIUM 75 MCG PO TABS
75.0000 ug | ORAL_TABLET | Freq: Every day | ORAL | 1 refills | Status: DC
  Filled 2022-03-11: qty 30, 30d supply, fill #0

## 2022-03-11 MED ORDER — HEPARIN SOD (PORK) LOCK FLUSH 100 UNIT/ML IV SOLN
500.0000 [IU] | Freq: Once | INTRAVENOUS | Status: AC | PRN
  Administered 2022-03-11: 500 [IU]

## 2022-03-11 MED ORDER — SODIUM CHLORIDE 0.9 % IV SOLN
200.0000 mg | Freq: Once | INTRAVENOUS | Status: AC
  Administered 2022-03-11: 200 mg via INTRAVENOUS
  Filled 2022-03-11: qty 200

## 2022-03-11 MED ORDER — SODIUM CHLORIDE 0.9% FLUSH
10.0000 mL | Freq: Once | INTRAVENOUS | Status: AC
  Administered 2022-03-11: 10 mL

## 2022-03-11 MED ORDER — SODIUM CHLORIDE 0.9 % IV SOLN: Freq: Once | INTRAVENOUS | Status: AC

## 2022-03-11 NOTE — Assessment & Plan Note (Signed)
This has resolved I am optimistic that she is responding well to treatment She has pain medicine to take as needed

## 2022-03-11 NOTE — Patient Instructions (Signed)
[facility] MEDICAL ONCOLOGY  Discharge Instructions: Thank you for choosing [facility] to provide your oncology and hematology care.   If you have a lab appointment with the Cancer Center, please go directly to the Cancer Center and check in at the registration area.   Wear comfortable clothing and clothing appropriate for easy access to any Portacath or PICC line.   We strive to give you quality time with your provider. You may need to reschedule your appointment if you arrive late (15 or more minutes).  Arriving late affects you and other patients whose appointments are after yours.  Also, if you miss three or more appointments without notifying the office, you may be dismissed from the clinic at the provider's discretion.      For prescription refill requests, have your pharmacy contact our office and allow 72 hours for refills to be completed.    Today you received the following chemotherapy and/or immunotherapy agents: Keytruda.       To help prevent nausea and vomiting after your treatment, we encourage you to take your nausea medication as directed.  BELOW ARE SYMPTOMS THAT SHOULD BE REPORTED IMMEDIATELY: *FEVER GREATER THAN 100.4 F (38 C) OR HIGHER *CHILLS OR SWEATING *NAUSEA AND VOMITING THAT IS NOT CONTROLLED WITH YOUR NAUSEA MEDICATION *UNUSUAL SHORTNESS OF BREATH *UNUSUAL BRUISING OR BLEEDING *URINARY PROBLEMS (pain or burning when urinating, or frequent urination) *BOWEL PROBLEMS (unusual diarrhea, constipation, pain near the anus) TENDERNESS IN MOUTH AND THROAT WITH OR WITHOUT PRESENCE OF ULCERS (sore throat, sores in mouth, or a toothache) UNUSUAL RASH, SWELLING OR PAIN  UNUSUAL VAGINAL DISCHARGE OR ITCHING   Items with * indicate a potential emergency and should be followed up as soon as possible or go to the Emergency Department if any problems should occur.  Please show the CHEMOTHERAPY ALERT CARD or IMMUNOTHERAPY ALERT CARD at check-in to  the Emergency Department and triage nurse.  Should you have questions after your visit or need to cancel or reschedule your appointment, please contact [facility]ICAL [facility]COL[facility]  Dept: [phone]  and follow the prompts.  Office hours are 8:00 a.m. to 4:30 p.m. Monday - Friday. Please note that voicemails left after 4:00 p.m. may not be returned until the following business day.  We are closed weekends and major holidays. You have access to a nurse at all times for urgent questions. Please call the main number to the clinic Dept: [phone] and follow the prompts.   For any non-urgent questions, you may also contact your provider using MyChart. We now offer e-Visits for anyone 18 and older to request care online for non-urgent symptoms. For details visit [email or web address].   Also download the MyChart app! Go to the app store, search "MyChart", open the app, select [facility], and log in with your MyChart username and password.  Masks are optional in the cancer centers. If you would like for your care team to wear a mask while they are taking care of you, please let them know. You may have one support person who is at least 70 years old accompany you for your appointments. 

## 2022-03-11 NOTE — Progress Notes (Signed)
[facility] OFFICE PROGRESS NOTE  Patient Care Team: Pcp, No as PCP - General  ASSESSMENT & PLAN:  Uterine cancer (HCC) Clinically, she is doing very well She is improving with weight gain, less pelvic pain and improvement of pancytopenia She just completed radiation treatment I recommend her to continue on pembrolizumab I recommend we wait minimum 6 to 8 weeks before repeating imaging study, due around October  Deficiency anemia She has multifactorial anemia She does not need transfusion support today She will continue vitamin B12 injection every few months  Cancer associated pain This has resolved I am optimistic that she is responding well to treatment She has pain medicine to take as needed  Cellulitis of right lower extremity She has significant right lower extremity edema When she was hospitalized, she underwent significant evaluation and imaging which rule out DVT There is healthy granulation tissue but there is an open wound with weeping I placed clean gauze over it and recommend the patient to cover up her wound when she leaves her house but to leave with a dry at home I will reassess her wound again in a few weeks She does not need long-term antibiotic coverage  No orders of the defined types were placed in this encounter.   All questions were answered. The patient knows to call the clinic with any problems, questions or concerns. The total time spent in the appointment was 40 minutes encounter with patients including review of chart and various tests results, discussions about plan of care and coordination of care plan   Artis Delay, MD 03/11/2022 11:56 AM  INTERVAL HISTORY: Please see below for problem oriented charting. she returns for treatment follow-up on maintenance pembrolizumab for treatment of uterine cancer She was recently hospitalized for right lower extremity cellulitis The hospital notes and investigations were reviewed DVT was  excluded She completed a course of antibiotics with improvement of her cellulitis Overall, she is better Her appetite has improved She denies recent bleeding from her wound  REVIEW OF SYSTEMS:   Constitutional: Denies fevers, chills or abnormal weight loss Eyes: Denies blurriness of vision Ears, nose, mouth, throat, and face: Denies mucositis or sore throat Respiratory: Denies cough, dyspnea or wheezes Cardiovascular: Denies palpitation, chest discomfort  Gastrointestinal:  Denies nausea, heartburn or change in bowel habits Lymphatics: Denies new lymphadenopathy or easy bruising Neurological:Denies numbness, tingling or new weaknesses Behavioral/Psych: Mood is stable, no new changes  All other systems were reviewed with the patient and are negative.  I have reviewed the past medical history, past surgical history, social history and family history with the patient and they are unchanged from previous note.  ALLERGIES:  has No Known Allergies.  MEDICATIONS:  Current Outpatient Medications  Medication Sig Dispense Refill   levothyroxine (SYNTHROID) 50 MCG tablet Take 1 tablet (50 mcg total) by mouth daily before breakfast. (Patient not taking: Reported on 02/28/2022) 30 tablet 0   acetaminophen (TYLENOL) 325 MG tablet Take 650 mg by mouth at bedtime as needed (pain).     furosemide (LASIX) 20 MG tablet TAKE 1 TABLET (20 MG TOTAL) BY MOUTH IN THE MORNING (Patient taking differently: Take 20 mg by mouth daily.) 30 tablet 0   magnesium hydroxide (MILK OF MAGNESIA) 400 MG/5ML suspension Take 30 mLs by mouth daily.     polyethylene glycol (MIRALAX / GLYCOLAX) 17 g packet Take 17 g by mouth daily. (Patient taking differently: Take 17 g by mouth daily as needed for mild constipation.) 30 each 1  No current facility-administered medications for this visit.   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   Uterine cancer ([place])  01/05/2021 Imaging   Outside CT imaging  Bilateral hydronephrosis related extrinsic compression on the ureters by the enlarged uterus.   Multiple calcified uterine fibroids although peripherally enhancing diffuse decreased attenuation is noted centrally within the uterus highly suspicious for endometrial neoplasm given the current history of vaginal bleeding. Further evaluation is recommended. MRI may be helpful. Additionally tissue sampling would likely be helpful.   Hypodensity in the pancreas at the junction of the head and body of the pancreas. This may be related to volume averaging although the possibility of a cystic lesion could not be totally excluded. MRI is recommended for further evaluation.    01/05/2021 Initial Diagnosis   The patient reported at least 1 year of postmenopausal bleeding.  She does not have a primary care provider or gynecologist.  She had known fibroids and had noted that she needed a hysterectomy but had not gotten around to seeking care for this.  She presented to the [facility] emergency department on January 05, 2021 with new onset right lower quadrant pain and this prompted a CT scan of the abdomen and pelvis to be performed on 01/05/2021.     01/14/2021 Pathology Results   FINAL MICROSCOPIC DIAGNOSIS:   A. CERVIX, ENDOCERVIX, BIOPSY:  -   Endometrioid carcinoma, FIGO grade 1  -  See comment    01/26/2021 Imaging   MRI abdomen  1. No suspicious pancreatic mass identified. 2. Moderate to severe bilateral hydroureteronephrosis, similar to recent CT the abdomen and pelvis, presumably related to extrinsic ureteral compression from the patient's enlarged uterus, as demonstrated on recent CT the abdomen and pelvis 01/05/2021.   02/01/2021 Surgery   Surgery in [facility]  Date of Service: February 01, 2021 3:13 PM  Preoperative Diagnoses: Endometrial cancer  Postoperative Diagnoses: Endometrial cancer  Procedures: Total abdominal hysterectomy, bilateral salpingo-oophorectomy, left pelvic lymphadenectomy, right para-aortic lymphadenectomy  Surgeon: Everitt Amber, MD  Findings: Tumor appreciated in the cervical canal. Large, broad, multi fibroid uterus with a calcified fibroid filling the posterior cul de sac, an anterior lower uterine segment fibroid, and bilateral fibroids near the level of the cardinal ligament. No evidence of intraperitoneal disease. Normal appearing bilateral tubes and ovaries. Bilateral hydroureter from uterine compression. Tumor excreted from specimen into the pelvis at time of removal. An approximately 2cm enlarged left pelvic lymph node and a 1cm enlarged right aorta caval lymph node were identified.     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 (0.4 cm) - Right ovary, negative for tumor - Bilateral fallopian tubes, negative for tumor - Multiple leiomyomata (up to 8.5 cm)   B: Lymph node, aortocaval, excision - Metastatic carcinoma involving 1 lymph node (0.4 cm) (1/1)   C:  Lymph node, left pelvic, excision -Metastatic carcinoma involving 1 lymph node (0.3 cm) (1/1)   Diagnosis Comment    The majority of this tumor is composed of well differentiated (overall grade 2, but with areas consistent with grade 1) endometrioid adenocarcinoma with squamous and mucinous differentiation (approximately 70%); multifocally there is abrupt transition to an undifferentiated malignancy with a high-grade monotonous noncohesive appearance (best seen in slide A2) consistent with undifferentiated carcinoma.  The overall morphologic and immunophenotypic findings are consistent with a dedifferentiated endometrial carcinoma.   Of note, the carcinoma present in the lymph nodes is well differentiated endometrioid carcinoma and the metastatic deposit in the ovary is undifferentiated carcinoma. Carcinoma is present less than 1 mm from uterine corpus serosa (in the lower uterine segment).  The assessment of the paracervical soft tissue margins is challenging due to the disrupted nature of the specimen in this area, however tumor appears present less than 1 mm from paracervical soft tissue.  No definite parametrial invasion is identified.   Immunohistochemistry performed at [facility] (with appropriate controls) demonstrates the following immunoprofile in cells of interest: Well differentiated endometrioid carcinoma: CK AE1/AE3: Positive ER: Positive (>90%, strong staining) P53: Favor wild-type (see comment below)   Dedifferentiated carcinoma: CK AE1/AE3: Very rare cells, predominantly negative ER: Negative P53: Small focus of mutant type staining in a background of wild-type staining (see comment below)   P53 staining comment: In the well differentiated carcinoma component the staining pattern is predominantly wild-type although focally there is slightly increased levels of staining noted.  In the dedifferentiated tumor component, most of the tissue exhibits wild-type pattern staining, however there  is a distinct small area that exhibits increased/overexpressed mutant pattern p53 staining.    Immunohistochemistry testing for mismatch repair (MMR) proteins is performed on block A2:   MLH1:  Loss of nuclear expression MSH2:  Intact nuclear expression MSH6:  Intact nuclear expression PMS2:  Loss of nuclear expression   Loss of nuclear expression of MLH1 and PMS2 can be a result of sporadic microsatellite instability (via hypermethylation of the MLH1 promoter region) or due to Lynch syndrome / [place]. Materials will be submitted to the Molecular Pathology Laboratory [phone]) for [facility] promoter hypermethylation testing. These results will be issued in a separate report.      03/05/2021 Initial Diagnosis   Uterine cancer ([place])   03/05/2021 Cancer Staging   Staging form: Corpus Uteri - Carcinoma and Carcinosarcoma, AJCC 8th Edition - Pathologic stage from 03/05/2021: FIGO Stage IVB (rpT3, pN2a, cM1) - Signed by [name], MD on 11/12/2021 Stage prefix: Recurrence   03/11/2021 Imaging   1. Status post interval hysterectomy and oophorectomy. 2. There is a rim enhancing soft tissue mass centered in the vagina, measuring approximately 4.4 x 3.1 cm. This is new, or significantly enlarged compared to prior examination dated 01/05/2021. Findings are concerning for malignant involvement. Correlate with physical examination. Contrast enhanced MRI may be helpful to further detail soft tissue anatomy of the low pelvis if indicated. 3. There is at least one newly enlarged left iliac lymph node measuring up to 1.4 x 1.3 cm, nonspecific although concerning for nodal metastatic disease. 4.  Fluid attenuation lymphocele or seroma adjacent to surgical clip at the level right ovarian vein. 5. Status post interval midline laparotomy. Subcutaneous fluid within the inferior aspect of the wound. The presence or absence of infection is not established by CT.     03/23/2021 - 07/09/2021 Chemotherapy   Patient is on  Treatment Plan : UTERINE Carboplatin AUC 6 / Paclitaxel q21d     03/23/2021 Procedure   Placement of a subcutaneous power-injectable port device. Catheter tip at the superior cavoatrial junction   07/30/2021 Imaging   1. Large ill-defined partially enhancing and likely centrally necrotic vaginal and paravaginal mass. Recommend correlation with direct visualization. MRI pelvis without and with contrast may be helpful for further evaluation. 2. No findings suspicious for omental or peritoneal surface disease. No adenopathy.   08/09/2021 - 09/08/2021 Radiation Therapy   Indication for treatment:  Curative       Radiation treatment dates:   08/09/21 through 09/08/21 Site/dose:  IMRT Uterus; Vagina : 41.5 Gy delivered in 23 Fx with 1.80 Gy/Fx (Planned dosage was for 45.00 GY delivered in 25 Fx with 1.80 Gy/Fx, her final 2 treatments (and boost) was canceled secondary to significant radiation reaction)   Beams/energy: 6X   11/11/2021 Imaging   1. 11 mm left upper lobe pulmonary nodule in the apicoposterior segment abuts the posterior pleural margin. In this clinical context the appearance is very concerning for a metastatic lesion although we do not have prior cross-sectional imaging to assess stability. Proximity to the fifth rib and adjacent major fissure might make percutaneous biopsy tricky; PET-CT would also be one way of assessing the metabolic activity/malignant potential of this lesion. This may be a good candidate case for discussion at multidisciplinary cancer conference. 2. Increased adenopathy along the left iliac chain, including an internal iliac node which abuts the left sacral plexus. 3. The lower vaginal and vestibular mass measures about 7.0 by 5.5 by 5.9 cm and has some internal gas which may indicate ulceration along the vestibule/perineum. Poor definition of tissue planes between this process in the base of the urinary bladder as well as the anus. The mass extends along the left anterior  obturator internus muscle. 4. Other imaging findings of potential clinical significance: Prominent main pulmonary artery, cannot exclude pulmonary arterial hypertension. Aortic Atherosclerosis (ICD10-I70.0). Lower lumbar spondylosis and degenerative disc disease.   11/19/2021 -  Chemotherapy   Patient is on Treatment Plan : UTERINE Pembrolizumab (200) q21d     01/21/2022 Imaging   1. Status post hysterectomy. Generally diminished size of an ulcerated mass involving the vagina when compared to prior examination, difficult to accurately measure due to poor resolution of adjacent soft tissues. 2. Significant interval decrease in size of left pelvic sidewall and iliac lymphadenopathy. 3. Interval enlargement of a pulmonary nodule of the posterior left upper lobe. 4. 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 5. New, diffuse urinary bladder wall thickening and adjacent fat stranding, consistent with nonspecific infectious or inflammatory cystitis, most likely related to local radiation therapy.     PHYSICAL EXAMINATION: ECOG PERFORMANCE STATUS: 2 - Symptomatic, <50% confined to bed  Vitals:   03/11/22 1020  BP: 128/74  Pulse: 73  Resp: 18  Temp: 97.8 F (36.6 C)  SpO2: 100%   Filed Weights   03/11/22 1020  Weight: 160 lb 3.2 oz (72.7 kg)    GENERAL:alert, no  distress and comfortable SKIN: Noted weeping wound on the right lower extremity but with healthy granulation tissue EYES: normal, Conjunctiva are pink and non-injected, sclera clear OROPHARYNX:no exudate, no erythema and lips, buccal mucosa, and tongue normal  NECK: supple, thyroid normal size, non-tender, without nodularity LYMPH:  no palpable lymphadenopathy in the cervical, axillary or inguinal LUNGS: clear to auscultation and percussion with normal breathing  effort HEART: regular rate & rhythm and no murmurs with moderate bilateral lower extremity edema ABDOMEN:abdomen soft, non-tender and normal bowel sounds Musculoskeletal:no cyanosis of digits and no clubbing  NEURO: alert & oriented x 3 with fluent speech, no focal motor/sensory deficits  LABORATORY DATA:  I have reviewed the data as listed    Component Value Date/Time   NA 138 03/11/2022 0930   K 3.6 03/11/2022 0930   CL 105 03/11/2022 0930   CO2 28 03/11/2022 0930   GLUCOSE 110 (H) 03/11/2022 0930   BUN 11 03/11/2022 0930   CREATININE 0.59 03/11/2022 0930   CALCIUM 9.0 03/11/2022 0930   PROT 7.7 03/11/2022 0930   ALBUMIN 3.5 03/11/2022 0930   AST 15 03/11/2022 0930   ALT 14 03/11/2022 0930   ALKPHOS 91 03/11/2022 0930   BILITOT 0.3 03/11/2022 0930   GFRNONAA >60 03/11/2022 0930    No results found for: "SPEP", "UPEP"  Lab Results  Component Value Date   WBC 4.7 03/11/2022   NEUTROABS 3.2 03/11/2022   HGB 9.3 (L) 03/11/2022   HCT 29.7 (L) 03/11/2022   MCV 72.4 (L) 03/11/2022   PLT 469 (H) 03/11/2022      Chemistry      Component Value Date/Time   NA 138 03/11/2022 0930   K 3.6 03/11/2022 0930   CL 105 03/11/2022 0930   CO2 28 03/11/2022 0930   BUN 11 03/11/2022 0930   CREATININE 0.59 03/11/2022 0930      Component Value Date/Time   CALCIUM 9.0 03/11/2022 0930   ALKPHOS 91 03/11/2022 0930   AST 15 03/11/2022 0930   ALT 14 03/11/2022 0930   BILITOT 0.3 03/11/2022 0930       RADIOGRAPHIC STUDIES: I have personally reviewed the radiological images as listed and agreed with the findings in the report. VAS [place] LOWER EXTREMITY VENOUS (DVT)  Result Date: 03/01/2022  Lower Venous DVT Study Patient Name:  SHALIYAH TAITE  Date of Exam:   02/28/2022 Medical Rec #: [id number]            Accession #:    [id number] Date of Birth: February 29, 1951            Patient Gender: F Patient Age:   71 years Exam Location:  [facility] Procedure:      VAS [place] LOWER EXTREMITY  VENOUS (DVT) Referring Phys: Roxanne Mins PATEL --------------------------------------------------------------------------------  Indications: Pain, swelling, warmth (cellulitis).  Limitations: Body habitus and poor ultrasound/tissue interface. Comparison Study: No previous exams Performing Technologist: Jody Hill RVT, RDMS  Examination Guidelines: A complete evaluation includes B-mode imaging, spectral Doppler, color Doppler, and power Doppler as needed of all accessible portions of each vessel. Bilateral testing is considered an integral part of a complete examination. Limited examinations for reoccurring indications may be performed as noted. The reflux portion of the exam is performed with the patient in reverse Trendelenburg.  +--------+---------------+---------+-----------+----------+--------------------+ RIGHT   CompressibilityPhasicitySpontaneityPropertiesThrombus Aging       +--------+---------------+---------+-----------+----------+--------------------+ CFV     Full           Yes      Yes                                       +--------+---------------+---------+-----------+----------+--------------------+  SFJ     Full                                                              +--------+---------------+---------+-----------+----------+--------------------+ FV Prox Full           Yes      Yes                                       +--------+---------------+---------+-----------+----------+--------------------+ FV Mid  Full           Yes      Yes                                       +--------+---------------+---------+-----------+----------+--------------------+ FV      Full           Yes      Yes                                       Distal                                                                    +--------+---------------+---------+-----------+----------+--------------------+ PFV     Full                                                               +--------+---------------+---------+-----------+----------+--------------------+ POP     Full           Yes      Yes                                       +--------+---------------+---------+-----------+----------+--------------------+ PTV                    Yes      Yes                  patent by                                                                 color/doppler        +--------+---------------+---------+-----------+----------+--------------------+ PERO  Not well visualized  +--------+---------------+---------+-----------+----------+--------------------+   +----+---------------+---------+-----------+----------+--------------+ LEFTCompressibilityPhasicitySpontaneityPropertiesThrombus Aging +----+---------------+---------+-----------+----------+--------------+ CFV Full           Yes      Yes                                 +----+---------------+---------+-----------+----------+--------------+    Summary: RIGHT: - There is no evidence of deep vein thrombosis in the lower extremity.  - Ultrasound characteristics of enlarged lymph nodes are noted in the groin. Diffuse subcutaneous edema  LEFT: - No evidence of common femoral vein obstruction. - Ultrasound characteristics of enlarged lymph nodes noted in the groin.  *See table(s) above for measurements and observations. Electronically signed by Servando Snare MD on 03/01/2022 at 2:11:08 PM.    Final    CT EXTREMITY LOWER RIGHT W CONTRAST  Result Date: 02/27/2022 CLINICAL DATA:  Soft tissue mass, lower leg, deep. Possible bug bite to right upper shin 2 weeks ago. Worsening leg swelling. EXAM: CT OF THE LOWER RIGHT EXTREMITY WITH CONTRAST TECHNIQUE: Multidetector CT imaging of the lower right extremity was performed according to the standard protocol following intravenous contrast administration. RADIATION DOSE REDUCTION: This exam was performed according to the  departmental dose-optimization program which includes automated exposure control, adjustment of the mA and/or kV according to patient size and/or use of iterative reconstruction technique. CONTRAST:  15mL OMNIPAQUE IOHEXOL 300 MG/ML  SOLN COMPARISON:  None Available. FINDINGS: Bones/Joint/Cartilage No acute fracture or dislocation. Moderate-to-severe tricompartmental degenerative changes, most pronounced in the medial compartment. A subchondral defect is noted in the talar dome laterally, possible osteochondral defect. Degenerative changes are present at the ankle. Mild calcaneal spurring. No joint effusion at the knee. Ligaments Suboptimally assessed by CT. Muscles and Tendons No abnormal enhancement or abscess. Mild intramuscular edema is noted. Soft tissues Diffuse subcutaneous soft tissue swelling and edema are noted. No subcutaneous air. There is a 2.5 cm blister along the anterior aspect of the mid tibia. No abscess is identified. Focal fluid collections are noted in the popliteal fossa, possible Baker's cysts. Calcifications are noted in the Baker's cyst, possible loose bodies. IMPRESSION: 1. Diffuse subcutaneous edema and fat stranding. No rim enhancing fluid collection is seen to suggest abscess. 2. Blister along the anterior aspect of the mid tibia. 3. Diffuse intramuscular edema without evidence of abnormal enhancement or subcutaneous air. 4. Moderate-to-severe degenerative changes at the knee. 5. Fluid collections in the popliteal fossa containing calcifications, possible Baker's cysts with loose bodies. Electronically Signed   By: Brett Fairy M.D.   On: 02/27/2022 22:30   DG Chest Port 1 View  Result Date: 02/27/2022 CLINICAL DATA:  Shortness of breath, pain and swelling right lower leg EXAM: PORTABLE CHEST 1 VIEW COMPARISON:  None Available. FINDINGS: Transverse diameter of heart is increased. There are no signs of pulmonary edema or focal pulmonary consolidation. There is no pleural effusion or  pneumothorax. Tip of right IJ chest port is seen in superior vena cava. IMPRESSION: Cardiomegaly. There are no signs of pulmonary edema or focal pulmonary consolidation. Electronically Signed   By: Elmer Picker M.D.   On: 02/27/2022 19:23

## 2022-03-11 NOTE — Assessment & Plan Note (Signed)
Clinically, she is doing very well She is improving with weight gain, less pelvic pain and improvement of pancytopenia She just completed radiation treatment I recommend her to continue on pembrolizumab I recommend we wait minimum 6 to 8 weeks before repeating imaging study, due around October

## 2022-03-11 NOTE — Assessment & Plan Note (Signed)
She has multifactorial anemia She does not need transfusion support today She will continue vitamin B12 injection every few months

## 2022-03-11 NOTE — Assessment & Plan Note (Signed)
She has significant right lower extremity edema When she was hospitalized, she underwent significant evaluation and imaging which rule out DVT There is healthy granulation tissue but there is an open wound with weeping I placed clean gauze over it and recommend the patient to cover up her wound when she leaves her house but to leave with a dry at home I will reassess her wound again in a few weeks She does not need long-term antibiotic coverage

## 2022-03-12 ENCOUNTER — Other Ambulatory Visit: Payer: Self-pay

## 2022-03-12 LAB — T4: T4, Total: 2.4 ug/dL — ABNORMAL LOW (ref 4.5–12.0)

## 2022-03-15 ENCOUNTER — Other Ambulatory Visit: Payer: Self-pay | Admitting: Hematology and Oncology

## 2022-03-15 DIAGNOSIS — C55 Malignant neoplasm of uterus, part unspecified: Secondary | ICD-10-CM

## 2022-03-15 NOTE — Progress Notes (Signed)
ON PATHWAY [facility]IMEN - Uterine  No Change  Continue With Treatment as Ordered.  Original Decision Date/Time: 11/12/2021 13:12     A cycle is every 21 days:     Pembrolizumab   **Always confirm dose/schedule in your pharmacy ordering system**  Patient Characteristics: Endometrioid, Recurrent/Progressive Disease, Second Line, Systemic Recurrence, Relapse < 12 Months From Prior Therapy, MSI-H/dMMR or TMB-H Histology: Endometrioid Therapeutic Status: Recurrent or Progressive Disease Line of Therapy: Second Line Time to Recurrence: Relapse < 12 Months From Prior Therapy Microsatellite/Mismatch Repair Status: MSI-H/dMMR Tumor Mutational Burden (TMB): Unknown Intent of Therapy: Non-Curative / Palliative Intent, Discussed with Patient

## 2022-03-23 ENCOUNTER — Other Ambulatory Visit: Payer: Self-pay

## 2022-03-24 ENCOUNTER — Ambulatory Visit: Payer: Self-pay | Admitting: Radiation Oncology

## 2022-03-26 ENCOUNTER — Other Ambulatory Visit: Payer: Self-pay

## 2022-03-26 ENCOUNTER — Other Ambulatory Visit: Payer: Self-pay | Admitting: Hematology and Oncology

## 2022-03-31 ENCOUNTER — Telehealth: Payer: Self-pay

## 2022-03-31 NOTE — Telephone Encounter (Signed)
Attempted to call her back and unable to leave a message. She called the answering service last evening stating that she missed a call.

## 2022-04-01 ENCOUNTER — Encounter: Payer: Self-pay | Admitting: Hematology and Oncology

## 2022-04-01 ENCOUNTER — Other Ambulatory Visit: Payer: Self-pay

## 2022-04-01 ENCOUNTER — Other Ambulatory Visit: Payer: Self-pay | Admitting: Hematology and Oncology

## 2022-04-01 ENCOUNTER — Telehealth: Payer: Self-pay

## 2022-04-01 ENCOUNTER — Inpatient Hospital Stay: Payer: Medicare PPO

## 2022-04-01 ENCOUNTER — Inpatient Hospital Stay (HOSPITAL_BASED_OUTPATIENT_CLINIC_OR_DEPARTMENT_OTHER): Payer: Medicare PPO | Admitting: Hematology and Oncology

## 2022-04-01 VITALS — BP 154/83 | HR 54 | Temp 97.4°F | Resp 16 | Ht 63.0 in | Wt 166.6 lb

## 2022-04-01 VITALS — BP 125/61 | HR 72 | Temp 98.1°F | Resp 18

## 2022-04-01 DIAGNOSIS — D539 Nutritional anemia, unspecified: Secondary | ICD-10-CM | POA: Diagnosis not present

## 2022-04-01 DIAGNOSIS — L03115 Cellulitis of right lower limb: Secondary | ICD-10-CM | POA: Diagnosis not present

## 2022-04-01 DIAGNOSIS — C55 Malignant neoplasm of uterus, part unspecified: Secondary | ICD-10-CM | POA: Diagnosis not present

## 2022-04-01 DIAGNOSIS — Z923 Personal history of irradiation: Secondary | ICD-10-CM | POA: Diagnosis not present

## 2022-04-01 DIAGNOSIS — E039 Hypothyroidism, unspecified: Secondary | ICD-10-CM | POA: Diagnosis not present

## 2022-04-01 DIAGNOSIS — G893 Neoplasm related pain (acute) (chronic): Secondary | ICD-10-CM | POA: Diagnosis not present

## 2022-04-01 DIAGNOSIS — Z5112 Encounter for antineoplastic immunotherapy: Secondary | ICD-10-CM | POA: Diagnosis not present

## 2022-04-01 DIAGNOSIS — Z79899 Other long term (current) drug therapy: Secondary | ICD-10-CM | POA: Diagnosis not present

## 2022-04-01 DIAGNOSIS — D61818 Other pancytopenia: Secondary | ICD-10-CM | POA: Diagnosis not present

## 2022-04-01 DIAGNOSIS — Z90710 Acquired absence of both cervix and uterus: Secondary | ICD-10-CM | POA: Diagnosis not present

## 2022-04-01 LAB — CBC WITH DIFFERENTIAL (CANCER CENTER ONLY)
Abs Immature Granulocytes: 0 10*3/uL (ref 0.00–0.07)
Basophils Absolute: 0.1 10*3/uL (ref 0.0–0.1)
Basophils Relative: 2 %
Eosinophils Absolute: 0.2 10*3/uL (ref 0.0–0.5)
Eosinophils Relative: 6 %
HCT: 31.4 % — ABNORMAL LOW (ref 36.0–46.0)
Hemoglobin: 9.8 g/dL — ABNORMAL LOW (ref 12.0–15.0)
Immature Granulocytes: 0 %
Lymphocytes Relative: 34 %
Lymphs Abs: 1.1 10*3/uL (ref 0.7–4.0)
MCH: 23.4 pg — ABNORMAL LOW (ref 26.0–34.0)
MCHC: 31.2 g/dL (ref 30.0–36.0)
MCV: 74.9 fL — ABNORMAL LOW (ref 80.0–100.0)
Monocytes Absolute: 0.2 10*3/uL (ref 0.1–1.0)
Monocytes Relative: 7 %
Neutro Abs: 1.7 10*3/uL (ref 1.7–7.7)
Neutrophils Relative %: 51 %
Platelet Count: 280 10*3/uL (ref 150–400)
RBC: 4.19 MIL/uL (ref 3.87–5.11)
RDW: 20.1 % — ABNORMAL HIGH (ref 11.5–15.5)
WBC Count: 3.2 10*3/uL — ABNORMAL LOW (ref 4.0–10.5)
nRBC: 0 % (ref 0.0–0.2)

## 2022-04-01 LAB — CMP (CANCER CENTER ONLY)
ALT: 9 U/L (ref 0–44)
AST: 13 U/L — ABNORMAL LOW (ref 15–41)
Albumin: 3.5 g/dL (ref 3.5–5.0)
Alkaline Phosphatase: 92 U/L (ref 38–126)
Anion gap: 4 — ABNORMAL LOW (ref 5–15)
BUN: 11 mg/dL (ref 8–23)
CO2: 28 mmol/L (ref 22–32)
Calcium: 9 mg/dL (ref 8.9–10.3)
Chloride: 106 mmol/L (ref 98–111)
Creatinine: 0.69 mg/dL (ref 0.44–1.00)
GFR, Estimated: >60 mL/min (ref >60)
Glucose, Bld: 83 mg/dL (ref 70–99)
Potassium: 3.8 mmol/L (ref 3.5–5.1)
Sodium: 138 mmol/L (ref 135–145)
Total Bilirubin: 0.4 mg/dL (ref 0.3–1.2)
Total Protein: 7.5 g/dL (ref 6.5–8.1)

## 2022-04-01 LAB — IRON AND IRON BINDING CAPACITY (CC-WL,HP ONLY)
Iron: 36 ug/dL (ref 28–170)
Saturation Ratios: 13 % (ref 10.4–31.8)
TIBC: 284 ug/dL (ref 250–450)
UIBC: 248 ug/dL (ref 148–442)

## 2022-04-01 LAB — VITAMIN B12: Vitamin B-12: 247 pg/mL (ref 180–914)

## 2022-04-01 LAB — FERRITIN: Ferritin: 169 ng/mL (ref 11–307)

## 2022-04-01 LAB — TSH: TSH: 54.307 u[IU]/mL — ABNORMAL HIGH (ref 0.350–4.500)

## 2022-04-01 MED ORDER — LEVOTHYROXINE SODIUM 125 MCG PO TABS: 125.0000 ug | ORAL_TABLET | Freq: Every day | ORAL | 3 refills | Status: DC

## 2022-04-01 MED ORDER — SODIUM CHLORIDE 0.9 % IV SOLN: Freq: Once | INTRAVENOUS | Status: AC

## 2022-04-01 MED ORDER — HEPARIN SOD (PORK) LOCK FLUSH 100 UNIT/ML IV SOLN
500.0000 [IU] | Freq: Once | INTRAVENOUS | Status: AC | PRN
  Administered 2022-04-01: 500 [IU]

## 2022-04-01 MED ORDER — SODIUM CHLORIDE 0.9 % IV SOLN
200.0000 mg | Freq: Once | INTRAVENOUS | Status: AC
  Administered 2022-04-01: 200 mg via INTRAVENOUS
  Filled 2022-04-01: qty 8

## 2022-04-01 MED ORDER — SODIUM CHLORIDE 0.9% FLUSH
10.0000 mL | Freq: Once | INTRAVENOUS | Status: AC
  Administered 2022-04-01: 10 mL

## 2022-04-01 MED ORDER — SODIUM CHLORIDE 0.9% FLUSH
10.0000 mL | INTRAVENOUS | Status: DC | PRN
  Administered 2022-04-01: 10 mL

## 2022-04-01 NOTE — Patient Instructions (Signed)
[facility]ICAL [facility]COLOG[facility]  Discharge Instructions: Thank you for choosing [facility] to provide your oncology and hematology care.   If you have a lab appointment with the Cancer Center, please go directly to the Cancer Center and check in at the registration area.   Wear comfortable clothing and clothing appropriate for easy access to any Portacath or PICC line.   We strive to give you quality time with your provider. You may need to reschedule your appointment if you arrive late (15 or more minutes).  Arriving late affects you and other patients whose appointments are after yours.  Also, if you miss three or more appointments without notifying the office, you may be dismissed from the clinic at the provider's discretion.      For prescription refill requests, have your pharmacy contact our office and allow 72 hours for refills to be completed.    Today you received the following chemotherapy and/or immunotherapy agents: Keytruda.       To help prevent nausea and vomiting after your treatment, we encourage you to take your nausea medication as directed.  BELOW ARE SYMPTOMS THAT SHOULD BE REPORTED IMMEDIATELY: *FEVER GREATER THAN 100.4 F (38 C) OR HIGHER *CHILLS OR SWEATING *NAUSEA AND VOMITING THAT IS NOT CONTROLLED WITH YOUR NAUSEA MEDICATION *UNUSUAL SHORTNESS OF BREATH *UNUSUAL BRUISING OR BLEEDING *URINARY PROBLEMS (pain or burning when urinating, or frequent urination) *BOWEL PROBLEMS (unusual diarrhea, constipation, pain near the anus) TENDERNESS IN MOUTH AND THROAT WITH OR WITHOUT PRESENCE OF ULCERS (sore throat, sores in mouth, or a toothache) UNUSUAL RASH, SWELLING OR PAIN  UNUSUAL VAGINAL DISCHARGE OR ITCHING   Items with * indicate a potential emergency and should be followed up as soon as possible or go to the Emergency Department if any problems should occur.  Please show the CHEMOTHERAPY ALERT CARD or IMMUNOTHERAPY ALERT CARD at check-in to  the Emergency Department and triage nurse.  Should you have questions after your visit or need to cancel or reschedule your appointment, please contact [facility]  Dept: [phone]  and follow the prompts.  Office hours are 8:00 a.m. to 4:30 p.m. Monday - Friday. Please note that voicemails left after 4:00 p.m. may not be returned until the following business day.  We are closed weekends and major holidays. You have access to a nurse at all times for urgent questions. Please call the main number to the clinic Dept: [phone] and follow the prompts.   For any non-urgent questions, you may also contact your provider using MyChart. We now offer e-Visits for anyone 18 and older to request care online for non-urgent symptoms. For details visit [email or web address].   Also download the MyChart app! Go to the app store, search "MyChart", open the app, select [facility], and log in with your MyChart username and password.  Masks are optional in the cancer centers. If you would like for your care team to wear a mask while they are taking care of you, please let them know. You may have one support person who is at least 70 years old accompany you for your appointments. 

## 2022-04-01 NOTE — Assessment & Plan Note (Signed)
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

## 2022-04-01 NOTE — Assessment & Plan Note (Signed)
Clinically, she is doing very well She is improving with weight gain, less pelvic pain and improvement of pancytopenia She just completed radiation treatment I recommend her to continue on pembrolizumab I recommend repeat CT imaging next month for objective assessment of response to therapy

## 2022-04-01 NOTE — Telephone Encounter (Signed)
Called her per Dr. Alvy Bimler, TSH is very high. I just increased the dose to Synthroid to 125 mcg, and [facility] [place]. Reminded her to take 30 mins before breakfast in the morning. She verbalized understanding and said that she ran out of Rx 1 week ago. Given above message to Dr. Alvy Bimler.

## 2022-04-01 NOTE — Progress Notes (Signed)
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    Answer:   [facility]    Order Specific Question:   Radiology Contrast Protocol - do NOT remove file path    Answer:   [email or web address]    All questions were answered. The patient knows to call the clinic with any problems, questions or concerns. The total time spent in the appointment was 40 minutes encounter with patients including review of chart and various tests results, discussions about plan of care and coordination of care plan   Candice Lark, MD 04/01/2022 2:56 PM  INTERVAL HISTORY: Please see below for problem oriented charting. she returns for treatment follow-up on maintenance pembrolizumab Her leg wound is healing well She denies recent weight loss She has ran out of her Synthroid recently No recent bleeding She denies pelvic pain  REVIEW OF SYSTEMS:   Constitutional: Denies fevers,  chills or abnormal weight loss Eyes: Denies blurriness of vision Ears, nose, mouth, throat, and face: Denies mucositis or sore throat Respiratory: Denies cough, dyspnea or wheezes Cardiovascular: Denies palpitation, chest discomfort Gastrointestinal:  Denies nausea, heartburn or change in bowel habits Skin: Denies abnormal skin rashes Lymphatics: Denies new lymphadenopathy or easy bruising Neurological:Denies numbness, tingling or new weaknesses Behavioral/Psych: Mood is stable, no new changes  All other systems were reviewed with the patient and are negative.  I have reviewed the past medical history, past surgical history, social history and family history with the patient and they are unchanged from previous note.  ALLERGIES:  has No Known Allergies.  MEDICATIONS:  Current Outpatient Medications  Medication Sig Dispense Refill   acetaminophen (TYLENOL) 325 MG tablet Take 650 mg by mouth at bedtime as needed (pain).     furosemide (LASIX) 20 MG tablet Take 1 tablet (20 mg total) by mouth daily. 30 tablet 0   levothyroxine (SYNTHROID) 125 MCG tablet Take 1 tablet (125 mcg total) by mouth daily before breakfast. 30 tablet 3   magnesium hydroxide (MILK OF MAGNESIA) 400 MG/5ML suspension Take 30 mLs by mouth daily.     polyethylene glycol (MIRALAX / GLYCOLAX) 17 g packet Take 17 g by mouth daily. (Patient taking differently: Take 17 g by mouth daily as needed for mild constipation.) 30 each 1   No current facility-administered medications for this visit.   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   Uterine cancer ([place])  01/05/2021 Imaging   Outside CT imaging  Bilateral hydronephrosis related extrinsic compression on the ureters by the enlarged uterus.   Multiple calcified uterine fibroids although peripherally enhancing diffuse decreased attenuation is noted centrally within the uterus highly suspicious for endometrial neoplasm given the current history of vaginal bleeding. Further evaluation is recommended. MRI may be helpful. Additionally tissue sampling would likely be helpful.   Hypodensity in the pancreas at the junction of the head and body of the pancreas. This may be related to volume averaging although the possibility of a cystic lesion could not be totally excluded. MRI is recommended for further evaluation.    01/05/2021 Initial Diagnosis   The patient reported at least 1 year of postmenopausal bleeding.  She does not have a primary care provider or gynecologist.  She had known fibroids and had noted that she needed a hysterectomy but had not gotten around to seeking care for this.  [facility] on January 05, 2021 with new onset right lower quadrant pain and this prompted a CT scan of the abdomen and pelvis to be performed on 01/05/2021.     01/14/2021 Pathology Results   FINAL MICROSCOPIC DIAGNOSIS:   A. CERVIX, ENDOCERVIX, BIOPSY:  -  Endometrioid carcinoma, FIGO grade 1  -  See comment    01/26/2021 Imaging   MRI abdomen  1. No suspicious pancreatic mass identified. 2. Moderate to severe bilateral hydroureteronephrosis, similar to recent CT the abdomen and pelvis, presumably related to extrinsic ureteral compression from the patient's enlarged uterus, as demonstrated on recent CT the abdomen and pelvis 01/05/2021.   02/01/2021 Surgery   Surgery in [facility]  Date of Service: February 01, 2021 3:13 PM  Preoperative  Diagnoses: Endometrial cancer  Postoperative Diagnoses: Endometrial cancer  Procedures: Total abdominal hysterectomy, bilateral salpingo-oophorectomy, left pelvic lymphadenectomy, right para-aortic lymphadenectomy  Surgeon: Everitt Amber, MD  Findings: Tumor appreciated in the cervical canal. Large, broad, multi fibroid uterus with a calcified fibroid filling the posterior cul de sac, an anterior lower uterine segment fibroid, and bilateral fibroids near the level of the cardinal ligament. No evidence of intraperitoneal disease. Normal appearing bilateral tubes and ovaries. Bilateral hydroureter from uterine compression. Tumor excreted from specimen into the pelvis at time of removal. An approximately 2cm enlarged left pelvic lymph node and a 1cm enlarged right aorta caval lymph node were identified.     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) - Right ovary, negative for tumor - Bilateral fallopian tubes, negative for tumor - Multiple leiomyomata (up to 8.5 cm)   B: Lymph node, aortocaval, excision - Metastatic carcinoma involving 1 lymph node (0.4 cm) (1/1)   C: Lymph node, left pelvic, excision -Metastatic carcinoma involving 1 lymph node (0.3 cm) (1/1)   Diagnosis Comment    The majority of this tumor  is composed of well differentiated (overall grade 2, but with areas consistent with grade 1) endometrioid adenocarcinoma with squamous and mucinous differentiation (approximately 70%); multifocally there is abrupt transition to an undifferentiated malignancy with a high-grade monotonous noncohesive appearance (best seen in slide A2)  consistent with undifferentiated carcinoma.  The overall morphologic and immunophenotypic findings are consistent with a dedifferentiated endometrial carcinoma.   Of note, the carcinoma present in the lymph nodes is well differentiated endometrioid carcinoma and the metastatic deposit in the ovary is undifferentiated carcinoma. Carcinoma is present less than 1 mm from uterine corpus serosa (in the lower uterine segment).  The assessment of the paracervical soft tissue margins is challenging due to the disrupted nature of the specimen in this area, however tumor appears present less than 1 mm from paracervical soft tissue.  No definite parametrial invasion is identified.   Immunohistochemistry performed at [facility] (with appropriate controls) demonstrates the following immunoprofile in cells of interest: Well differentiated endometrioid carcinoma: CK AE1/AE3: Positive ER: Positive (>90%, strong staining) P53: Favor wild-type (see comment below)   Dedifferentiated carcinoma: CK AE1/AE3: Very rare cells, predominantly negative ER: Negative P53: Small focus of mutant type staining in a background of wild-type staining (see comment below)   P53 staining comment: In the well differentiated carcinoma component the staining pattern is predominantly wild-type although focally there is slightly increased levels of staining noted.  In the dedifferentiated tumor component, most of the tissue exhibits wild-type pattern staining, however there is a distinct small area that exhibits increased/overexpressed mutant pattern p53 staining.    Immunohistochemistry testing for mismatch repair (MMR) proteins is performed on block A2:   MLH1:  Loss of nuclear expression MSH2:  Intact nuclear expression MSH6:  Intact nuclear expression PMS2:  Loss of nuclear expression   Loss of nuclear expression of MLH1 and PMS2 can be a result of sporadic microsatellite instability (via hypermethylation of the MLH1 promoter region) or  due to Lynch syndrome / Bennett. Materials will be submitted to the Molecular Pathology Laboratory [phone]) for [facility] promoter hypermethylation testing. These results will be issued in a separate report.      03/05/2021 Initial Diagnosis   Uterine cancer ([place])   03/05/2021 Cancer Staging   Staging form: Corpus Uteri - Carcinoma and Carcinosarcoma, AJCC 8th Edition - Pathologic stage from 03/05/2021: FIGO Stage IVB (rpT3, pN2a, cM1) - Signed by Candice Lark, MD on 11/12/2021 Stage prefix: Recurrence   03/11/2021 Imaging   1. Status post interval hysterectomy and oophorectomy. 2. There is a rim enhancing soft tissue mass centered in the vagina, measuring approximately 4.4 x 3.1 cm. This is new, or significantly enlarged compared to prior examination dated 01/05/2021. Findings are concerning for malignant involvement. Correlate with physical examination. Contrast enhanced MRI may be helpful to further detail soft tissue anatomy of the low pelvis if indicated. 3. There is at least one newly enlarged left iliac lymph node measuring up to 1.4 x 1.3 cm, nonspecific although concerning for nodal metastatic disease. 4. Fluid attenuation lymphocele or seroma adjacent to surgical clip at the level right ovarian vein. 5. Status post interval midline laparotomy. Subcutaneous fluid within the inferior aspect of the wound. The presence or absence of infection is not established by CT.     03/23/2021 - 07/09/2021 Chemotherapy   Patient is on Treatment Plan : UTERINE Carboplatin AUC 6 / Paclitaxel q21d     03/23/2021 Procedure   Placement of a subcutaneous power-injectable port device. Catheter tip at the superior cavoatrial junction  07/30/2021 Imaging   1. Large ill-defined partially enhancing and likely centrally necrotic vaginal and paravaginal mass. Recommend correlation with direct visualization. MRI pelvis without and with contrast may be helpful for further evaluation. 2. No findings suspicious for  omental or peritoneal surface disease. No adenopathy.   08/09/2021 - 09/08/2021 Radiation Therapy   Indication for treatment:  Curative       Radiation treatment dates:   08/09/21 through 09/08/21 Site/dose:  IMRT Uterus; Vagina : 41.5 Gy delivered in 23 Fx with 1.80 Gy/Fx (Planned dosage was for 45.00 GY delivered in 25 Fx with 1.80 Gy/Fx, her final 2 treatments (and boost) was canceled secondary to significant radiation reaction)   Beams/energy: 6X   11/11/2021 Imaging   1. 11 mm left upper lobe pulmonary nodule in the apicoposterior segment abuts the posterior pleural margin. In this clinical context the appearance is very concerning for a metastatic lesion although we do not have prior cross-sectional imaging to assess stability. Proximity to the fifth rib and adjacent major fissure might make percutaneous biopsy tricky; PET-CT would also be one way of assessing the metabolic activity/malignant potential of this lesion. This may be a good candidate case for discussion at multidisciplinary cancer conference. 2. Increased adenopathy along the left iliac chain, including an internal iliac node which abuts the left sacral plexus. 3. The lower vaginal and vestibular mass measures about 7.0 by 5.5 by 5.9 cm and has some internal gas which may indicate ulceration along the vestibule/perineum. Poor definition of tissue planes between this process in the base of the urinary bladder as well as the anus. The mass extends along the left anterior obturator internus muscle. 4. Other imaging findings of potential clinical significance: Prominent main pulmonary artery, cannot exclude pulmonary arterial hypertension. Aortic Atherosclerosis (ICD10-I70.0). Lower lumbar spondylosis and degenerative disc disease.   11/19/2021 - 03/11/2022 Chemotherapy   Patient is on Treatment Plan : UTERINE Pembrolizumab (200) q21d     11/19/2021 -  Chemotherapy   Patient is on Treatment Plan : UTERINE Pembrolizumab (200) q21d      01/21/2022 Imaging   1. Status post hysterectomy. Generally diminished size of an ulcerated mass involving the vagina when compared to prior examination, difficult to accurately measure due to poor resolution of adjacent soft tissues. 2. Significant interval decrease in size of left pelvic sidewall and iliac lymphadenopathy. 3. Interval enlargement of a pulmonary nodule of the posterior left upper lobe. 4. 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 5. New, diffuse urinary bladder wall thickening and adjacent fat stranding, consistent with nonspecific infectious or inflammatory cystitis, most likely related to local radiation therapy.     PHYSICAL EXAMINATION: ECOG PERFORMANCE STATUS: 2 - Symptomatic, <50% confined to bed  Vitals:   04/01/22 1106  BP: (!) 154/83  Pulse: (!) 54  Resp: 16  Temp: (!) 97.4 F (36.3 C)   Filed Weights   04/01/22 1106  Weight: 166 lb 9.6 oz (75.6 kg)    GENERAL:alert, no distress and comfortable SKIN: skin color, texture, turgor are normal, no rashes or significant lesions EYES: normal, Conjunctiva are pink and non-injected, sclera clear OROPHARYNX:no exudate, no erythema and lips, buccal mucosa, and tongue normal  NECK: supple, thyroid normal size, non-tender, without nodularity LYMPH:  no palpable lymphadenopathy in the cervical, axillary or inguinal LUNGS: clear to auscultation and percussion with normal breathing effort HEART: regular rate &  rhythm and no murmurs with moderate bilateral lower extremity edema ABDOMEN:abdomen soft, non-tender and normal bowel sounds Musculoskeletal:no cyanosis of digits and no clubbing  NEURO: alert & oriented x 3 with fluent speech, no focal motor/sensory deficits  LABORATORY DATA:  I have reviewed the data as listed    Component Value Date/Time   NA  138 04/01/2022 1025   K 3.8 04/01/2022 1025   CL 106 04/01/2022 1025   CO2 28 04/01/2022 1025   GLUCOSE 83 04/01/2022 1025   BUN 11 04/01/2022 1025   CREATININE 0.69 04/01/2022 1025   CALCIUM 9.0 04/01/2022 1025   PROT 7.5 04/01/2022 1025   ALBUMIN 3.5 04/01/2022 1025   AST 13 (L) 04/01/2022 1025   ALT 9 04/01/2022 1025   ALKPHOS 92 04/01/2022 1025   BILITOT 0.4 04/01/2022 1025   GFRNONAA >60 04/01/2022 1025    No results found for: "SPEP", "UPEP"  Lab Results  Component Value Date   WBC 3.2 (L) 04/01/2022   NEUTROABS 1.7 04/01/2022   HGB 9.8 (L) 04/01/2022   HCT 31.4 (L) 04/01/2022   MCV 74.9 (L) 04/01/2022   PLT 280 04/01/2022      Chemistry      Component Value Date/Time   NA 138 04/01/2022 1025   K 3.8 04/01/2022 1025   CL 106 04/01/2022 1025   CO2 28 04/01/2022 1025   BUN 11 04/01/2022 1025   CREATININE 0.69 04/01/2022 1025      Component Value Date/Time   CALCIUM 9.0 04/01/2022 1025   ALKPHOS 92 04/01/2022 1025   AST 13 (L) 04/01/2022 1025   ALT 9 04/01/2022 1025   BILITOT 0.4 04/01/2022 1025

## 2022-04-01 NOTE — Assessment & Plan Note (Signed)
This is improved with adequate nutrition Observe

## 2022-04-01 NOTE — Assessment & Plan Note (Signed)
She has multifactorial anemia She does not need transfusion support today She will continue vitamin B12 injection every few months and I plan to repeat B12 and iron studies today

## 2022-04-02 ENCOUNTER — Other Ambulatory Visit: Payer: Self-pay

## 2022-04-02 LAB — T4: T4, Total: 1 ug/dL — ABNORMAL LOW (ref 4.5–12.0)

## 2022-04-10 ENCOUNTER — Other Ambulatory Visit: Payer: Self-pay

## 2022-04-11 ENCOUNTER — Other Ambulatory Visit: Payer: Self-pay | Admitting: Hematology and Oncology

## 2022-04-20 ENCOUNTER — Ambulatory Visit (HOSPITAL_COMMUNITY)
Admission: RE | Admit: 2022-04-20 | Discharge: 2022-04-20 | Disposition: A | Discharge status: Home / Self Care | Payer: Medicare PPO | Source: Ambulatory Visit | Attending: Hematology and Oncology | Admitting: Hematology and Oncology

## 2022-04-20 ENCOUNTER — Inpatient Hospital Stay: Payer: Medicare PPO | Attending: Gynecologic Oncology

## 2022-04-20 ENCOUNTER — Other Ambulatory Visit: Payer: Self-pay

## 2022-04-20 ENCOUNTER — Other Ambulatory Visit: Payer: Medicare PPO

## 2022-04-20 DIAGNOSIS — C55 Malignant neoplasm of uterus, part unspecified: Secondary | ICD-10-CM | POA: Insufficient documentation

## 2022-04-20 DIAGNOSIS — K769 Liver disease, unspecified: Secondary | ICD-10-CM | POA: Diagnosis not present

## 2022-04-20 DIAGNOSIS — J8410 Pulmonary fibrosis, unspecified: Secondary | ICD-10-CM | POA: Diagnosis not present

## 2022-04-20 DIAGNOSIS — I700 Atherosclerosis of aorta: Secondary | ICD-10-CM | POA: Diagnosis not present

## 2022-04-20 DIAGNOSIS — R590 Localized enlarged lymph nodes: Secondary | ICD-10-CM | POA: Diagnosis not present

## 2022-04-20 DIAGNOSIS — Z8541 Personal history of malignant neoplasm of cervix uteri: Secondary | ICD-10-CM | POA: Diagnosis not present

## 2022-04-20 DIAGNOSIS — R918 Other nonspecific abnormal finding of lung field: Secondary | ICD-10-CM | POA: Diagnosis not present

## 2022-04-20 LAB — CMP (CANCER CENTER ONLY)
ALT: 13 U/L (ref 0–44)
AST: 12 U/L — ABNORMAL LOW (ref 15–41)
Albumin: 3.9 g/dL (ref 3.5–5.0)
Alkaline Phosphatase: 107 U/L (ref 38–126)
Anion gap: 5 (ref 5–15)
BUN: 19 mg/dL (ref 8–23)
CO2: 26 mmol/L (ref 22–32)
Calcium: 9.1 mg/dL (ref 8.9–10.3)
Chloride: 107 mmol/L (ref 98–111)
Creatinine: 0.64 mg/dL (ref 0.44–1.00)
GFR, Estimated: >60 mL/min (ref >60)
Glucose, Bld: 85 mg/dL (ref 70–99)
Potassium: 4 mmol/L (ref 3.5–5.1)
Sodium: 138 mmol/L (ref 135–145)
Total Bilirubin: 0.4 mg/dL (ref 0.3–1.2)
Total Protein: 8.3 g/dL — ABNORMAL HIGH (ref 6.5–8.1)

## 2022-04-20 LAB — CBC WITH DIFFERENTIAL (CANCER CENTER ONLY)
Abs Immature Granulocytes: 0.01 10*3/uL (ref 0.00–0.07)
Basophils Absolute: 0.1 10*3/uL (ref 0.0–0.1)
Basophils Relative: 2 %
Eosinophils Absolute: 0.2 10*3/uL (ref 0.0–0.5)
Eosinophils Relative: 4 %
HCT: 33.6 % — ABNORMAL LOW (ref 36.0–46.0)
Hemoglobin: 10.6 g/dL — ABNORMAL LOW (ref 12.0–15.0)
Immature Granulocytes: 0 %
Lymphocytes Relative: 25 %
Lymphs Abs: 1.1 10*3/uL (ref 0.7–4.0)
MCH: 23.8 pg — ABNORMAL LOW (ref 26.0–34.0)
MCHC: 31.5 g/dL (ref 30.0–36.0)
MCV: 75.5 fL — ABNORMAL LOW (ref 80.0–100.0)
Monocytes Absolute: 0.4 10*3/uL (ref 0.1–1.0)
Monocytes Relative: 9 %
Neutro Abs: 2.5 10*3/uL (ref 1.7–7.7)
Neutrophils Relative %: 60 %
Platelet Count: 270 10*3/uL (ref 150–400)
RBC: 4.45 MIL/uL (ref 3.87–5.11)
RDW: 19.1 % — ABNORMAL HIGH (ref 11.5–15.5)
WBC Count: 4.2 10*3/uL (ref 4.0–10.5)
nRBC: 0 % (ref 0.0–0.2)

## 2022-04-20 MED ORDER — IOHEXOL 9 MG/ML PO SOLN
ORAL | Status: AC
  Administered 2022-04-20: 1000 mL
  Filled 2022-04-20: qty 1000

## 2022-04-20 MED ORDER — HEPARIN SOD (PORK) LOCK FLUSH 100 UNIT/ML IV SOLN
500.0000 [IU] | Freq: Once | INTRAVENOUS | Status: AC
  Administered 2022-04-20: 500 [IU] via INTRAVENOUS

## 2022-04-20 MED ORDER — HEPARIN SOD (PORK) LOCK FLUSH 100 UNIT/ML IV SOLN
INTRAVENOUS | Status: AC
  Filled 2022-04-20: qty 5

## 2022-04-20 MED ORDER — SODIUM CHLORIDE 0.9% FLUSH
10.0000 mL | Freq: Once | INTRAVENOUS | Status: AC
  Administered 2022-04-20: 10 mL

## 2022-04-20 MED ORDER — SODIUM CHLORIDE (PF) 0.9 % IJ SOLN
INTRAMUSCULAR | Status: AC
  Filled 2022-04-20: qty 50

## 2022-04-20 MED ORDER — IOHEXOL 300 MG/ML  SOLN
100.0000 mL | Freq: Once | INTRAMUSCULAR | Status: AC | PRN
  Administered 2022-04-20: 100 mL via INTRAVENOUS

## 2022-04-20 MED ORDER — IOHEXOL 9 MG/ML PO SOLN: 500.0000 mL | ORAL | Status: AC

## 2022-04-22 ENCOUNTER — Telehealth: Payer: Self-pay

## 2022-04-22 ENCOUNTER — Inpatient Hospital Stay: Payer: Medicare PPO | Admitting: Hematology and Oncology

## 2022-04-22 ENCOUNTER — Inpatient Hospital Stay: Payer: Medicare PPO

## 2022-04-22 ENCOUNTER — Encounter: Payer: Self-pay | Admitting: Hematology and Oncology

## 2022-04-22 NOTE — Telephone Encounter (Signed)
Attempted to call regarding being late for appts today. Unable to leave a message due to voicemail being full.

## 2022-05-05 ENCOUNTER — Other Ambulatory Visit: Payer: Self-pay

## 2022-05-10 ENCOUNTER — Other Ambulatory Visit: Payer: Self-pay

## 2022-05-17 ENCOUNTER — Other Ambulatory Visit: Payer: Self-pay

## 2022-05-19 ENCOUNTER — Other Ambulatory Visit: Payer: Medicare PPO

## 2022-05-19 ENCOUNTER — Ambulatory Visit: Payer: Medicare PPO | Admitting: Hematology and Oncology

## 2022-05-19 ENCOUNTER — Ambulatory Visit: Payer: Medicare PPO

## 2022-05-19 ENCOUNTER — Other Ambulatory Visit: Payer: Self-pay | Admitting: Hematology and Oncology

## 2022-05-20 ENCOUNTER — Telehealth: Payer: Self-pay

## 2022-05-20 NOTE — Telephone Encounter (Signed)
Returned her call regarding missing appts. Left a message asking her to call the office back. Sent a scheduling message to reschedule yesterdays missed appts.

## 2022-05-23 ENCOUNTER — Telehealth: Payer: Self-pay

## 2022-05-23 NOTE — Telephone Encounter (Signed)
Returned her call regarding when her next appts are scheduled. Left appt details for 11/16 appts and ask her to call the office for questions.

## 2022-05-26 ENCOUNTER — Other Ambulatory Visit: Payer: Medicare PPO

## 2022-05-26 ENCOUNTER — Ambulatory Visit: Payer: Medicare PPO

## 2022-05-26 ENCOUNTER — Ambulatory Visit: Payer: Medicare PPO | Admitting: Hematology and Oncology

## 2022-05-30 ENCOUNTER — Inpatient Hospital Stay: Payer: Medicare PPO

## 2022-05-30 ENCOUNTER — Other Ambulatory Visit: Payer: Self-pay

## 2022-05-30 ENCOUNTER — Other Ambulatory Visit: Payer: Self-pay | Admitting: Hematology and Oncology

## 2022-05-30 ENCOUNTER — Inpatient Hospital Stay: Payer: Medicare PPO | Attending: Gynecologic Oncology | Admitting: Hematology and Oncology

## 2022-05-30 ENCOUNTER — Encounter: Payer: Self-pay | Admitting: Hematology and Oncology

## 2022-05-30 VITALS — BP 133/85 | HR 67 | Temp 98.0°F | Resp 18 | Ht 63.0 in | Wt 190.0 lb

## 2022-05-30 DIAGNOSIS — C55 Malignant neoplasm of uterus, part unspecified: Secondary | ICD-10-CM

## 2022-05-30 DIAGNOSIS — C7800 Secondary malignant neoplasm of unspecified lung: Secondary | ICD-10-CM | POA: Diagnosis not present

## 2022-05-30 DIAGNOSIS — Z5112 Encounter for antineoplastic immunotherapy: Secondary | ICD-10-CM | POA: Diagnosis not present

## 2022-05-30 DIAGNOSIS — K5909 Other constipation: Secondary | ICD-10-CM | POA: Insufficient documentation

## 2022-05-30 DIAGNOSIS — D539 Nutritional anemia, unspecified: Secondary | ICD-10-CM | POA: Insufficient documentation

## 2022-05-30 DIAGNOSIS — E039 Hypothyroidism, unspecified: Secondary | ICD-10-CM | POA: Insufficient documentation

## 2022-05-30 DIAGNOSIS — L03115 Cellulitis of right lower limb: Secondary | ICD-10-CM

## 2022-05-30 LAB — CMP (CANCER CENTER ONLY)
ALT: 29 U/L (ref 0–44)
AST: 34 U/L (ref 15–41)
Albumin: 4 g/dL (ref 3.5–5.0)
Alkaline Phosphatase: 87 U/L (ref 38–126)
Anion gap: 6 (ref 5–15)
BUN: 21 mg/dL (ref 8–23)
CO2: 26 mmol/L (ref 22–32)
Calcium: 9 mg/dL (ref 8.9–10.3)
Chloride: 108 mmol/L (ref 98–111)
Creatinine: 0.91 mg/dL (ref 0.44–1.00)
GFR, Estimated: >60 mL/min (ref >60)
Glucose, Bld: 89 mg/dL (ref 70–99)
Potassium: 3.9 mmol/L (ref 3.5–5.1)
Sodium: 140 mmol/L (ref 135–145)
Total Bilirubin: 0.4 mg/dL (ref 0.3–1.2)
Total Protein: 8.3 g/dL — ABNORMAL HIGH (ref 6.5–8.1)

## 2022-05-30 LAB — CBC WITH DIFFERENTIAL (CANCER CENTER ONLY)
Abs Immature Granulocytes: 0.01 10*3/uL (ref 0.00–0.07)
Basophils Absolute: 0.1 10*3/uL (ref 0.0–0.1)
Basophils Relative: 1 %
Eosinophils Absolute: 0.4 10*3/uL (ref 0.0–0.5)
Eosinophils Relative: 9 %
HCT: 35.2 % — ABNORMAL LOW (ref 36.0–46.0)
Hemoglobin: 11.1 g/dL — ABNORMAL LOW (ref 12.0–15.0)
Immature Granulocytes: 0 %
Lymphocytes Relative: 21 %
Lymphs Abs: 0.8 10*3/uL (ref 0.7–4.0)
MCH: 23.9 pg — ABNORMAL LOW (ref 26.0–34.0)
MCHC: 31.5 g/dL (ref 30.0–36.0)
MCV: 75.7 fL — ABNORMAL LOW (ref 80.0–100.0)
Monocytes Absolute: 0.3 10*3/uL (ref 0.1–1.0)
Monocytes Relative: 7 %
Neutro Abs: 2.3 10*3/uL (ref 1.7–7.7)
Neutrophils Relative %: 62 %
Platelet Count: 227 10*3/uL (ref 150–400)
RBC: 4.65 MIL/uL (ref 3.87–5.11)
RDW: 16.7 % — ABNORMAL HIGH (ref 11.5–15.5)
WBC Count: 3.8 10*3/uL — ABNORMAL LOW (ref 4.0–10.5)
nRBC: 0 % (ref 0.0–0.2)

## 2022-05-30 LAB — TSH: TSH: 58.353 u[IU]/mL — ABNORMAL HIGH (ref 0.350–4.500)

## 2022-05-30 MED ORDER — LEVOTHYROXINE SODIUM 125 MCG PO TABS: 125.0000 ug | ORAL_TABLET | Freq: Every day | ORAL | 3 refills | Status: DC

## 2022-05-30 MED ORDER — SODIUM CHLORIDE 0.9 % IV SOLN
200.0000 mg | Freq: Once | INTRAVENOUS | Status: AC
  Administered 2022-05-30: 200 mg via INTRAVENOUS
  Filled 2022-05-30: qty 200

## 2022-05-30 MED ORDER — SODIUM CHLORIDE 0.9 % IV SOLN: Freq: Once | INTRAVENOUS | Status: AC

## 2022-05-30 MED ORDER — HEPARIN SOD (PORK) LOCK FLUSH 100 UNIT/ML IV SOLN
500.0000 [IU] | Freq: Once | INTRAVENOUS | Status: AC | PRN
  Administered 2022-05-30: 500 [IU]

## 2022-05-30 MED ORDER — SODIUM CHLORIDE 0.9% FLUSH
10.0000 mL | INTRAVENOUS | Status: DC | PRN
  Administered 2022-05-30: 10 mL

## 2022-05-30 MED ORDER — SODIUM CHLORIDE 0.9% FLUSH: 10.0000 mL | Freq: Once | INTRAVENOUS | Status: DC

## 2022-05-30 NOTE — Patient Instructions (Signed)
[facility]ICAL [facility]COL[facility]  Discharge Instructions: Thank you for choosing [facility] to provide your oncology and hematology care.   If you have a lab appointment with the Cancer Center, please go directly to the Cancer Center and check in at the registration area.   Wear comfortable clothing and clothing appropriate for easy access to any Portacath or PICC line.   We strive to give you quality time with your provider. You may need to reschedule your appointment if you arrive late (15 or more minutes).  Arriving late affects you and other patients whose appointments are after yours.  Also, if you miss three or more appointments without notifying the office, you may be dismissed from the clinic at the provider's discretion.      For prescription refill requests, have your pharmacy contact our office and allow 72 hours for refills to be completed.    Today you received the following chemotherapy and/or immunotherapy agents: Keytruda.       To help prevent nausea and vomiting after your treatment, we encourage you to take your nausea medication as directed.  BELOW ARE SYMPTOMS THAT SHOULD BE REPORTED IMMEDIATELY: *FEVER GREATER THAN 100.4 F (38 C) OR HIGHER *CHILLS OR SWEATING *NAUSEA AND VOMITING THAT IS NOT CONTROLLED WITH YOUR NAUSEA MEDICATION *UNUSUAL SHORTNESS OF BREATH *UNUSUAL BRUISING OR BLEEDING *URINARY PROBLEMS (pain or burning when urinating, or frequent urination) *BOWEL PROBLEMS (unusual diarrhea, constipation, pain near the anus) TENDERNESS IN MOUTH AND THROAT WITH OR WITHOUT PRESENCE OF ULCERS (sore throat, sores in mouth, or a toothache) UNUSUAL RASH, SWELLING OR PAIN  UNUSUAL VAGINAL DISCHARGE OR ITCHING   Items with * indicate a potential emergency and should be followed up as soon as possible or go to the Emergency Department if any problems should occur.  Please show the CHEMOTHERAPY ALERT CARD or IMMUNOTHERAPY ALERT CARD at check-in to  the Emergency Department and triage nurse.  Should you have questions after your visit or need to cancel or reschedule your appointment, please contact [facility]  Dept: [phone]  and follow the prompts.  Office hours are 8:00 a.m. to 4:30 p.m. Monday - Friday. Please note that voicemails left after 4:00 p.m. may not be returned until the following business day.  We are closed weekends and major holidays. You have access to a nurse at all times for urgent questions. Please call the main number to the clinic Dept: [phone] and follow the prompts.   For any non-urgent questions, you may also contact your provider using MyChart. We now offer e-Visits for anyone 18 and older to request care online for non-urgent symptoms. For details visit [email or web address].   Also download the MyChart app! Go to the app store, search "MyChart", open the app, select [facility], and log in with your MyChart username and password.  Masks are optional in the cancer centers. If you would like for your care team to wear a mask while they are taking care of you, please let them know. You may have one support Dianah Pruett who is at least 70 years old accompany you for your appointments. 

## 2022-05-30 NOTE — Progress Notes (Signed)
[facility] OFFICE PROGRESS NOTE  Patient Care Team: Pcp, No as PCP - General  ASSESSMENT & PLAN:  Uterine cancer ([place]) The patient had multiple no-shows She thought she only has come back once a month and thought she only has missed 1 appointment I updated her contact list I reviewed her recent CT imaging from last month which show good response to therapy except for a lung nodule and abnormal pelvic lymphadenopathy which I suspect is related to recent cellulitis of her lower extremity We will resume pembrolizumab today I plan to repeat imaging study again middle January  Hypothyroidism She ran out of prescription and has not been taking it for over a week I reinforced the importance of the patient taking her thyroid medication long-term and warned her about the danger of stopping We will recheck her thyroid function today and I refilled her prescription  Cellulitis of right lower extremity This has resolved We will continue conservative approach and close monitoring  Deficiency anemia She has multifactorial anemia and pancytopenia She will continue vitamin B12 injection every few months   Orders Placed This Encounter  Procedures   TSH    Standing Status:   Future    Number of Occurrences:   1    Standing Expiration Date:   05/31/2023   CBC with Differential ([facility] Only)    Standing Status:   Future    Standing Expiration Date:   06/25/2023   CMP ([facility] only)    Standing Status:   Future    Standing Expiration Date:   06/25/2023   T4    Standing Status:   Future    Standing Expiration Date:   06/25/2023   TSH    Standing Status:   Future    Standing Expiration Date:   06/25/2023   CBC with Differential (Cancer Center Only)    Standing Status:   Future    Standing Expiration Date:   07/16/2023   CMP ([facility] only)    Standing Status:   Future    Standing Expiration Date:   07/16/2023   T4    Standing Status:   Future    Standing  Expiration Date:   07/16/2023   TSH    Standing Status:   Future    Standing Expiration Date:   07/16/2023    All questions were answered. The patient knows to call the clinic with any problems, questions or concerns. The total time spent in the appointment was 40 minutes encounter with patients including review of chart and various tests results, discussions about plan of care and coordination of care plan   [name], MD 05/30/2022 9:17 AM  INTERVAL HISTORY: Please see below for problem oriented charting. she returns for treatment follow-up on maintenance pembrolizumab She has missed several appointments since October She complains of radiation burn in her genital area Her skin cellulitis is improved The patient has run out of her thyroid medicine over a week ago She has chronic constipation and has gained a lot of weight  REVIEW OF SYSTEMS:   Constitutional: Denies fevers, chills or abnormal weight loss Eyes: Denies blurriness of vision Ears, nose, mouth, throat, and face: Denies mucositis or sore throat Respiratory: Denies cough, dyspnea or wheezes Cardiovascular: Denies palpitation, chest discomfort Lymphatics: Denies new lymphadenopathy or easy bruising Neurological:Denies numbness, tingling or new weaknesses Behavioral/Psych: Mood is stable, no new changes  All other systems were reviewed with the patient and are negative.  I have reviewed  the past medical history, past surgical history, social history and family history with the patient and they are unchanged from previous note.  ALLERGIES:  has No Known Allergies.  MEDICATIONS:  Current Outpatient Medications  Medication Sig Dispense Refill   acetaminophen (TYLENOL) 325 MG tablet Take 650 mg by mouth at bedtime as needed (pain).     levothyroxine (SYNTHROID) 125 MCG tablet Take 1 tablet (125 mcg total) by mouth daily before breakfast. 30 tablet 3   magnesium hydroxide (MILK OF MAGNESIA) 400 MG/5ML suspension Take 30 mLs  by mouth daily.     polyethylene glycol (MIRALAX / GLYCOLAX) 17 g packet Take 17 g by mouth daily. (Patient taking differently: Take 17 g by mouth daily as needed for mild constipation.) 30 each 1   No current facility-administered medications for this visit.   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   Uterine cancer ([place])  01/05/2021 Imaging   Outside CT imaging  Bilateral hydronephrosis related extrinsic compression on the ureters by the enlarged uterus.   Multiple calcified uterine fibroids although peripherally enhancing diffuse decreased attenuation is noted centrally within the uterus highly suspicious for endometrial neoplasm given the current history of vaginal bleeding. Further evaluation is recommended. MRI may be helpful. Additionally tissue sampling would likely be helpful.   Hypodensity in the pancreas at the junction of the head and body of the pancreas. This may be related to volume averaging although the possibility of a cystic lesion could not be totally excluded. MRI is recommended for further evaluation.    01/05/2021 Initial Diagnosis   The patient reported at least 1 year of postmenopausal bleeding.  She does not have a primary care provider or gynecologist.  She had known  fibroids and had noted that she needed a hysterectomy but had not gotten around to seeking care for this.  She presented to the [facility] department on January 05, 2021 with new onset right lower quadrant pain and this prompted a CT scan of the abdomen and pelvis to be performed on 01/05/2021.     01/14/2021 Pathology Results   FINAL MICROSCOPIC DIAGNOSIS:   A. CERVIX, ENDOCERVIX, BIOPSY:  -  Endometrioid carcinoma, FIGO grade 1  -  See comment    01/26/2021 Imaging   MRI abdomen  1. No suspicious pancreatic mass identified. 2. Moderate to severe bilateral hydroureteronephrosis, similar to recent CT the abdomen and pelvis, presumably related to extrinsic ureteral compression from the patient's enlarged uterus, as demonstrated on recent CT the abdomen and pelvis 01/05/2021.   02/01/2021 Surgery   Surgery in [facility]  Date of Service: February 01, 2021 3:13 PM  Preoperative Diagnoses: Endometrial cancer  Postoperative Diagnoses: Endometrial cancer  Procedures: Total abdominal hysterectomy, bilateral salpingo-oophorectomy, left pelvic lymphadenectomy,  right para-aortic lymphadenectomy  Surgeon: Everitt Amber, MD  Findings: Tumor appreciated in the cervical canal. Large, broad, multi fibroid uterus with a calcified fibroid filling the posterior cul de sac, an anterior lower uterine segment fibroid, and bilateral fibroids near the level of the cardinal ligament. No evidence of intraperitoneal disease. Normal appearing bilateral tubes and ovaries. Bilateral hydroureter from uterine compression. Tumor excreted from specimen into the pelvis at time of removal. An approximately 2cm enlarged left pelvic lymph node and a 1cm enlarged right aorta caval lymph node were identified.     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 (0.4 cm) - Right ovary, negative for tumor - Bilateral fallopian tubes, negative for tumor - Multiple leiomyomata (up to 8.5 cm)   B: Lymph node, aortocaval, excision - Metastatic carcinoma involving 1 lymph node (0.4 cm) (1/1)   C: Lymph node, left pelvic, excision -Metastatic carcinoma involving 1 lymph node (0.3 cm) (1/1)   Diagnosis Comment    The majority of this tumor is composed of well differentiated (overall grade 2, but with areas consistent with grade 1) endometrioid adenocarcinoma with squamous and mucinous differentiation (approximately 70%); multifocally there is abrupt transition to an undifferentiated malignancy with a high-grade monotonous noncohesive appearance (best seen in slide A2) consistent with undifferentiated carcinoma.  The overall morphologic and immunophenotypic findings are consistent with a dedifferentiated endometrial carcinoma.   Of note, the carcinoma present in the lymph nodes is well differentiated endometrioid carcinoma and the metastatic deposit in the ovary is undifferentiated carcinoma. Carcinoma is present less than 1 mm from uterine corpus serosa (in the lower uterine segment).  The assessment of the paracervical soft tissue margins is challenging due to the disrupted nature of the specimen in this area, however tumor appears present less than 1 mm from paracervical soft tissue.  No definite parametrial invasion is identified.   Immunohistochemistry performed at [facility] (with appropriate controls) demonstrates the following immunoprofile in cells of interest: Well differentiated endometrioid carcinoma: CK AE1/AE3: Positive ER: Positive (>90%, strong staining) P53: Favor wild-type (see comment below)   Dedifferentiated carcinoma: CK AE1/AE3: Very rare cells, predominantly negative ER:  Negative P53: Small focus of mutant type staining in a background of wild-type staining (see comment below)   P53 staining comment: In the well differentiated carcinoma component the staining pattern is predominantly wild-type although focally there is slightly increased levels of staining noted.  In the dedifferentiated tumor component, most of the tissue exhibits wild-type pattern staining, however there is a distinct small area that exhibits increased/overexpressed mutant pattern p53 staining.    Immunohistochemistry testing for mismatch repair (MMR) proteins is performed on block A2:   MLH1:  Loss of nuclear expression MSH2:  Intact nuclear expression MSH6:  Intact nuclear expression PMS2:  Loss of nuclear expression   Loss of nuclear expression of MLH1 and PMS2 can be a result of sporadic microsatellite instability (via hypermethylation of the MLH1 promoter region) or due to Lynch syndrome / [place]. Materials will be submitted to the Molecular Pathology Laboratory [phone]) for [facility] promoter hypermethylation testing. These results will be issued in a separate report.      03/05/2021 Initial Diagnosis   Uterine cancer ([place])   03/05/2021 Cancer Staging  Staging form: Corpus Uteri - Carcinoma and Carcinosarcoma, AJCC 8th Edition - Pathologic stage from 03/05/2021: FIGO Stage IVB (rpT3, pN2a, cM1) - Signed by [name], MD on 11/12/2021 Stage prefix: Recurrence   03/11/2021 Imaging   1. Status post interval hysterectomy and oophorectomy. 2. There is a rim enhancing soft tissue mass centered in the vagina, measuring approximately 4.4 x 3.1 cm. This is new, or significantly enlarged compared to prior examination dated 01/05/2021. Findings are concerning for malignant involvement. Correlate with physical examination. Contrast enhanced MRI may be helpful to further detail soft tissue anatomy of the low pelvis if indicated. 3. There is at least one newly enlarged left iliac lymph node  measuring up to 1.4 x 1.3 cm, nonspecific although concerning for nodal metastatic disease. 4. Fluid attenuation lymphocele or seroma adjacent to surgical clip at the level right ovarian vein. 5. Status post interval midline laparotomy. Subcutaneous fluid within the inferior aspect of the wound. The presence or absence of infection is not established by CT.     03/23/2021 - 07/09/2021 Chemotherapy   Patient is on Treatment Plan : UTERINE Carboplatin AUC 6 / Paclitaxel q21d     03/23/2021 Procedure   Placement of a subcutaneous power-injectable port device. Catheter tip at the superior cavoatrial junction   07/30/2021 Imaging   1. Large ill-defined partially enhancing and likely centrally necrotic vaginal and paravaginal mass. Recommend correlation with direct visualization. MRI pelvis without and with contrast may be helpful for further evaluation. 2. No findings suspicious for omental or peritoneal surface disease. No adenopathy.   08/09/2021 - 09/08/2021 Radiation Therapy   Indication for treatment:  Curative       Radiation treatment dates:   08/09/21 through 09/08/21 Site/dose:  IMRT Uterus; Vagina : 41.5 Gy delivered in 23 Fx with 1.80 Gy/Fx (Planned dosage was for 45.00 GY delivered in 25 Fx with 1.80 Gy/Fx, her final 2 treatments (and boost) was canceled secondary to significant radiation reaction)   Beams/energy: 6X   11/11/2021 Imaging   1. 11 mm left upper lobe pulmonary nodule in the apicoposterior segment abuts the posterior pleural margin. In this clinical context the appearance is very concerning for a metastatic lesion although we do not have prior cross-sectional imaging to assess stability. Proximity to the fifth rib and adjacent major fissure might make percutaneous biopsy tricky; PET-CT would also be one way of assessing the metabolic activity/malignant potential of this lesion. This may be a good candidate case for discussion at multidisciplinary cancer conference. 2. Increased  adenopathy along the left iliac chain, including an internal iliac node which abuts the left sacral plexus. 3. The lower vaginal and vestibular mass measures about 7.0 by 5.5 by 5.9 cm and has some internal gas which may indicate ulceration along the vestibule/perineum. Poor definition of tissue planes between this process in the base of the urinary bladder as well as the anus. The mass extends along the left anterior obturator internus muscle. 4. Other imaging findings of potential clinical significance: Prominent main pulmonary artery, cannot exclude pulmonary arterial hypertension. Aortic Atherosclerosis (ICD10-I70.0). Lower lumbar spondylosis and degenerative disc disease.   11/19/2021 - 03/11/2022 Chemotherapy   Patient is on Treatment Plan : UTERINE Pembrolizumab (200) q21d     11/19/2021 -  Chemotherapy   Patient is on Treatment Plan : UTERINE Pembrolizumab (200) q21d     01/21/2022 Imaging   1. Status post hysterectomy. Generally diminished size of an ulcerated mass involving the vagina when compared to prior examination, difficult to  accurately measure due to poor resolution of adjacent soft tissues. 2. Significant interval decrease in size of left pelvic sidewall and iliac lymphadenopathy. 3. Interval enlargement of a pulmonary nodule of the posterior left upper lobe. 4. 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 5. New, diffuse urinary bladder wall thickening and adjacent fat stranding, consistent with nonspecific infectious or inflammatory cystitis, most likely related to local radiation therapy.   04/22/2022 Imaging   1. Resolution of previously noted soft tissue mass associated with the apex of the vagina. 2. Regression of previously noted left upper lobe pulmonary nodule. However, there are multiple new satellite nodules in  the adjacent lung parenchyma in the upper left lung, largest of which measures 1.2 x 0.6 cm in the superior segment of the left lower lobe. Close attention on follow-up studies is recommended. 3. Interval development of mildly enlarged bilateral inguinal lymph nodes. These could be reactive, however, the possibility of locoregional nodal metastases is not excluded, and close attention on follow-up imaging is recommended to ensure regression of these findings. 4. Aortic atherosclerosis, in addition to left anterior descending coronary artery disease. Assessment for potential risk factor modification, dietary therapy or pharmacologic therapy may be warranted, if clinically indicated.  5. Additional incidental findings, as above.     PHYSICAL EXAMINATION: ECOG PERFORMANCE STATUS: 1 - Symptomatic but completely ambulatory  Vitals:   05/30/22 0827  BP: 133/85  Pulse: 67  Resp: 18  Temp: 98 F (36.7 C)  SpO2: 100%   Filed Weights   05/30/22 0827  Weight: 190 lb (86.2 kg)    GENERAL:alert, no distress and comfortable NEURO: alert & oriented x 3 with fluent speech, no focal motor/sensory deficits  LABORATORY DATA:  I have reviewed the data as listed    Component Value Date/Time   NA 140 05/30/2022 0741   K 3.9 05/30/2022 0741   CL 108 05/30/2022 0741   CO2 26 05/30/2022 0741   GLUCOSE 89 05/30/2022 0741   BUN 21 05/30/2022 0741   CREATININE 0.91 05/30/2022 0741   CALCIUM 9.0 05/30/2022 0741   PROT 8.3 (H) 05/30/2022 0741   ALBUMIN 4.0 05/30/2022 0741   AST 34 05/30/2022 0741   ALT 29 05/30/2022 0741   ALKPHOS 87 05/30/2022 0741   BILITOT 0.4 05/30/2022 0741   GFRNONAA >60 05/30/2022 0741    No results found for: "SPEP", "UPEP"  Lab Results  Component Value Date   WBC 3.8 (L) 05/30/2022   NEUTROABS 2.3 05/30/2022   HGB 11.1 (L) 05/30/2022   HCT 35.2 (L) 05/30/2022   MCV 75.7 (L) 05/30/2022   PLT 227 05/30/2022      Chemistry      Component Value Date/Time   NA  140 05/30/2022 0741   K 3.9 05/30/2022 0741   CL 108 05/30/2022 0741   CO2 26 05/30/2022 0741   BUN 21 05/30/2022 0741   CREATININE 0.91 05/30/2022 0741      Component Value Date/Time   CALCIUM 9.0 05/30/2022 0741   ALKPHOS 87 05/30/2022 0741   AST 34 05/30/2022 0741   ALT 29 05/30/2022 0741   BILITOT 0.4 05/30/2022 0741       RADIOGRAPHIC STUDIES: I have reviewed recent CT imaging with the patient I have personally reviewed the radiological images as listed and agreed with the findings in the report.

## 2022-05-30 NOTE — Assessment & Plan Note (Signed)
She has multifactorial anemia and pancytopenia She will continue vitamin B12 injection every few months

## 2022-05-30 NOTE — Assessment & Plan Note (Signed)
The patient had multiple no-shows She thought she only has come back once a month and thought she only has missed 1 appointment I updated her contact list I reviewed her recent CT imaging from last month which show good response to therapy except for a lung nodule and abnormal pelvic lymphadenopathy which I suspect is related to recent cellulitis of her lower extremity We will resume pembrolizumab today I plan to repeat imaging study again middle January

## 2022-05-30 NOTE — Assessment & Plan Note (Signed)
This has resolved We will continue conservative approach and close monitoring

## 2022-05-30 NOTE — Assessment & Plan Note (Signed)
She ran out of prescription and has not been taking it for over a week I reinforced the importance of the patient taking her thyroid medication long-term and warned her about the danger of stopping We will recheck her thyroid function today and I refilled her prescription

## 2022-05-31 ENCOUNTER — Other Ambulatory Visit: Payer: Self-pay

## 2022-06-06 IMAGING — CT CT ABD-PELV W/ CM
2 of 5 series · 16 of 46 positions shown, 18 images · IV contrast (APPLIED)
Comparison: CT scan 03/11/2021

CLINICAL DATA: Restaging uterine/cervical cancer.

EXAM:
CT ABDOMEN AND PELVIS WITH CONTRAST
TECHNIQUE: Multidetector CT imaging of the abdomen and pelvis was performed
using the standard protocol following bolus administration of
intravenous contrast.

[Series 2: abd pel w · axial · 0.81mm/px · z∈[-495,-95]mm · 13 of 90 slices shown, 15 images]
[im 5/90  soft-tissue]
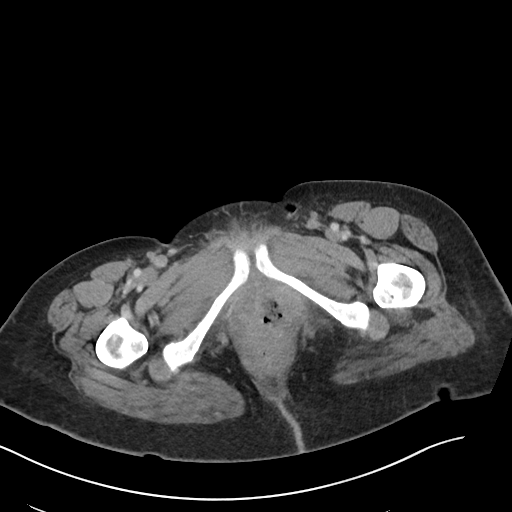
[im 5/90  bone]
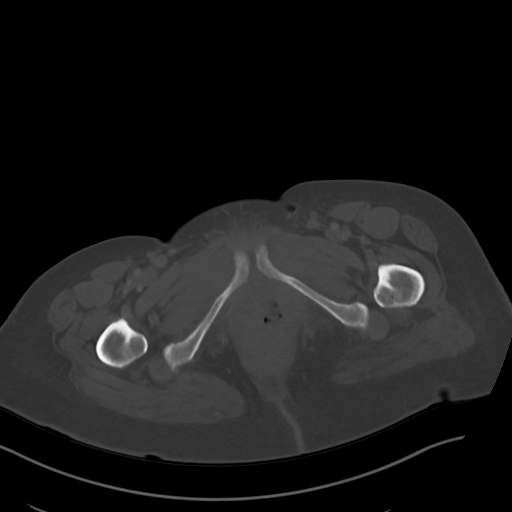
[im 10/90  soft-tissue]
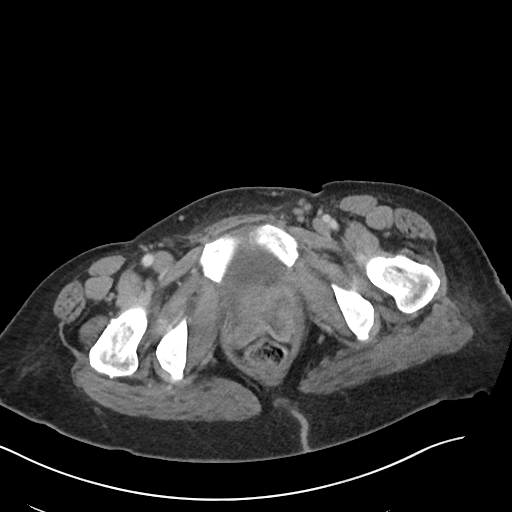
[im 20/90  soft-tissue]
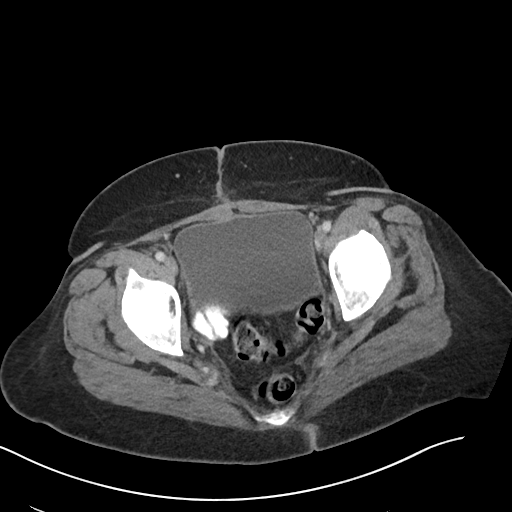
[im 25/90  soft-tissue]
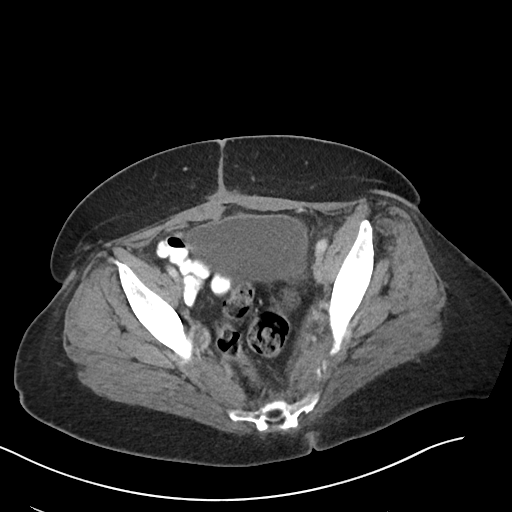
[im 30/90  soft-tissue]
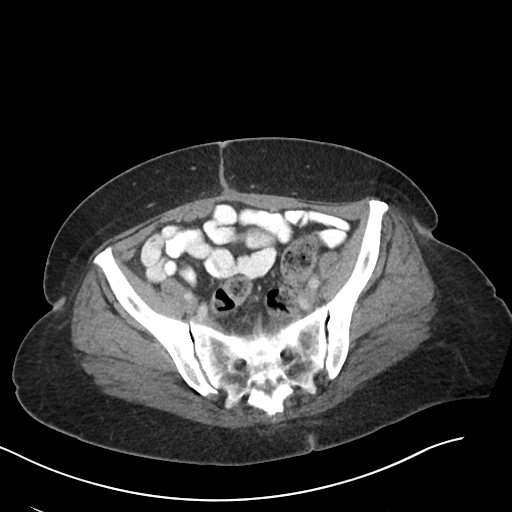
[im 40/90  soft-tissue]
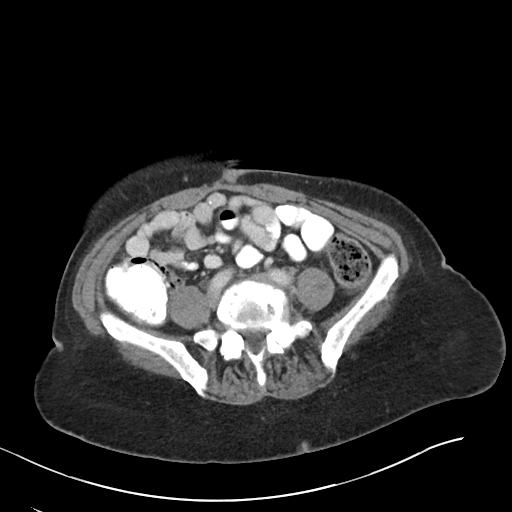
[im 45/90  soft-tissue]
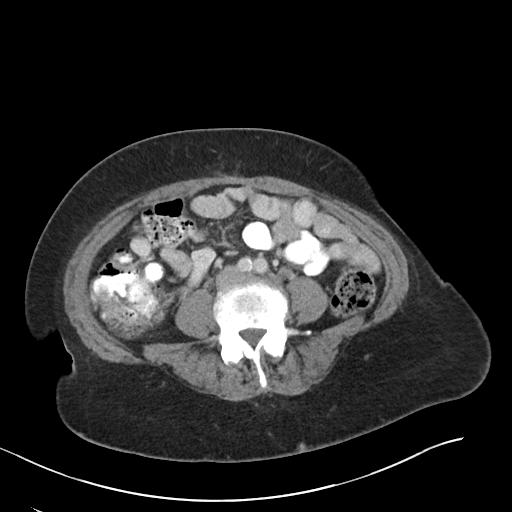
[im 50/90  soft-tissue]
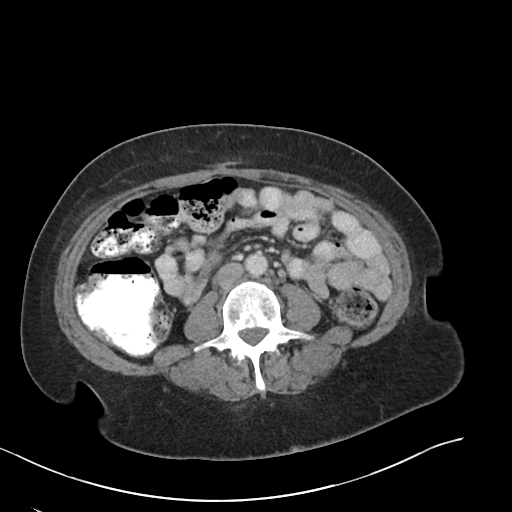
[im 60/90  soft-tissue]
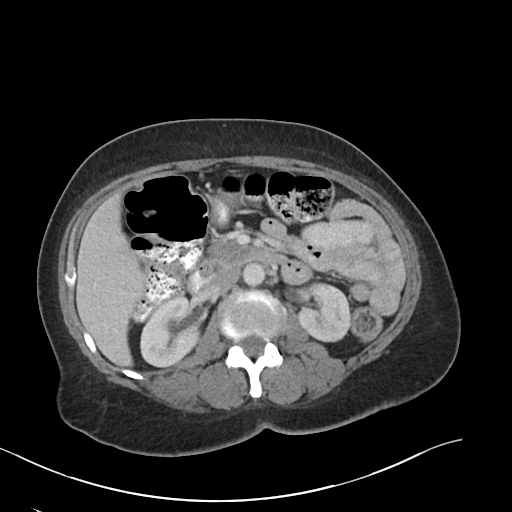
[im 60/90  bone]
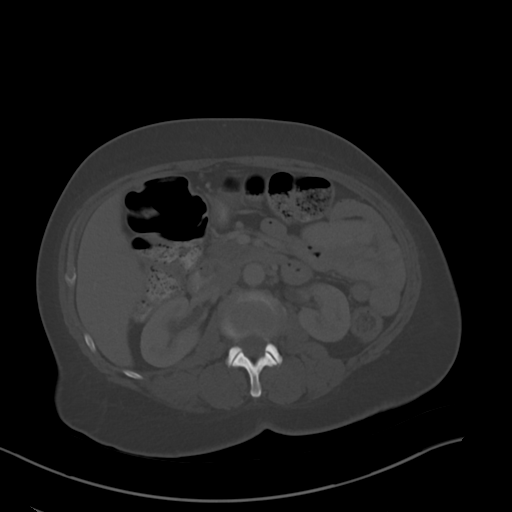
[im 65/90  soft-tissue]
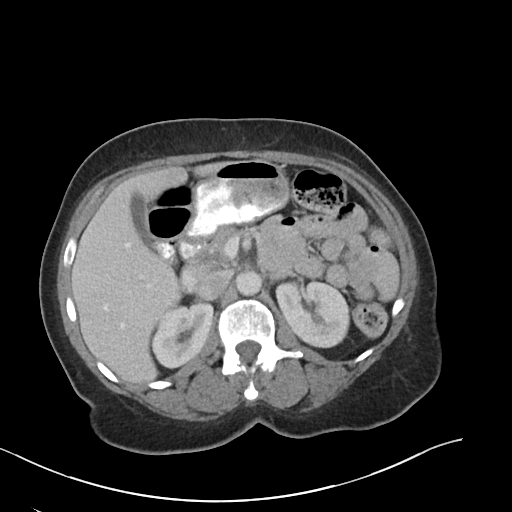
[im 70/90  soft-tissue]
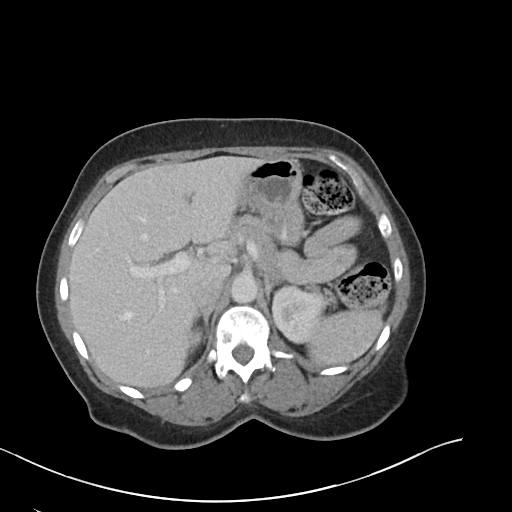
[im 80/90  soft-tissue]
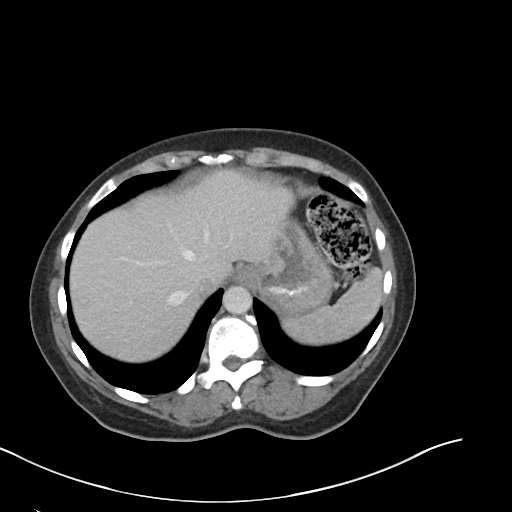
[im 85/90  soft-tissue]
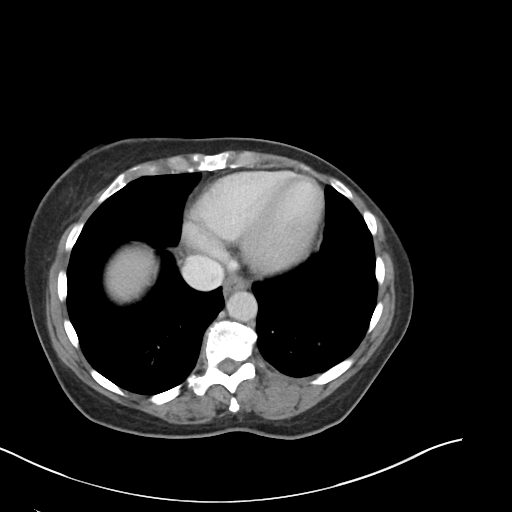

[Series 5: coronal · coronal · 0.87mm/px · 3 of 96 slices shown]
[im 32/96  soft-tissue]
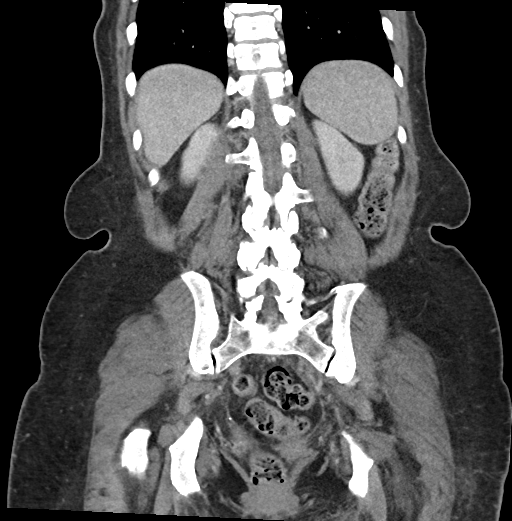
[im 43/96  soft-tissue]
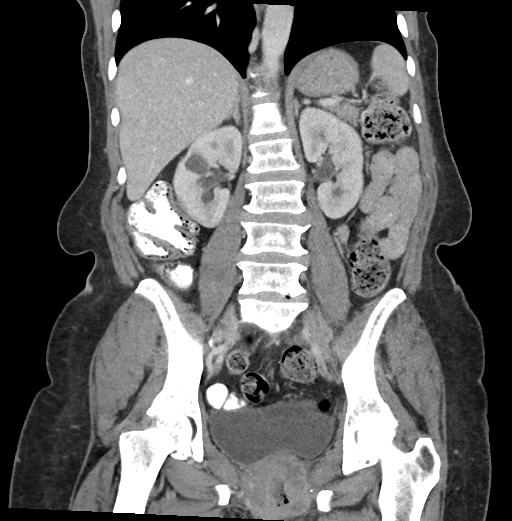
[im 53/96  soft-tissue]
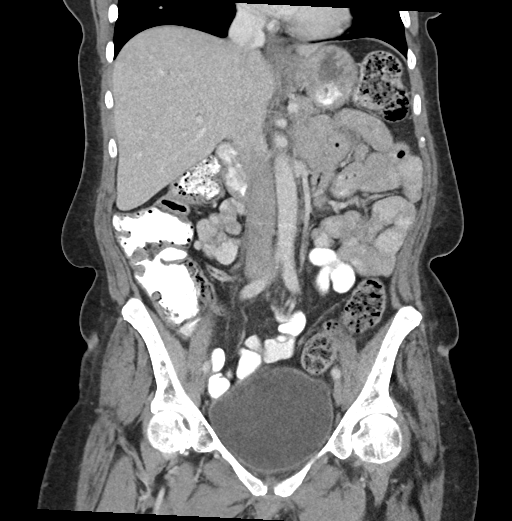

[16 of 46 positions shown; findings below may reference images not displayed]

RADIATION DOSE REDUCTION: This exam was performed according to the
departmental dose-optimization program which includes automated
exposure control, adjustment of the mA and/or kV according to
patient size and/or use of iterative reconstruction technique.

CONTRAST:  80mL OMNIPAQUE IOHEXOL 300 MG/ML  SOLN
FINDINGS: Lower chest: The lung bases are clear of acute process. No pleural
effusion or pulmonary lesions. The heart is normal in size. No
pericardial effusion. The distal esophagus and aorta are
unremarkable.

Hepatobiliary: No hepatic lesions or intrahepatic biliary
dilatation. The gallbladder is unremarkable. No common bile duct
dilatation.

Pancreas: No mass, inflammation or ductal dilatation.

Spleen: Normal size. No focal lesions.

Adrenals/Urinary Tract: Adrenal glands and kidneys are unremarkable
and stable. Stable right renal cyst. No renal lesions or
hydronephrosis. The bladder is unremarkable.

Stomach/Bowel: The stomach, duodenum, small bowel and colon are
unremarkable. The terminal ileum and appendix are normal.

Vascular/Lymphatic: The aorta is normal in caliber. No dissection.
The branch vessels are patent. The major venous structures are
patent. No mesenteric or retroperitoneal mass or adenopathy. Small
scattered lymph nodes are noted. No findings suspicious for omental
or peritoneal surface disease.

Reproductive: Status post hysterectomy.

Other: Large ill-defined partially enhancing and possibly centrally
necrotic vaginal and paravaginal mass. On the coronal images this
area measures approximately 6.1 x 5.8 cm. Recommend correlation with
direct visualization. MRI pelvis without and with contrast may be
helpful for further evaluation.

No pelvic adenopathy. Small scattered inguinal nodes. The largest on
the left side measures 10 mm on image 83/2.

Musculoskeletal: No significant bony findings.
IMPRESSION: 1. Large ill-defined partially enhancing and likely centrally
necrotic vaginal and paravaginal mass. Recommend correlation with
direct visualization. MRI pelvis without and with contrast may be
helpful for further evaluation.
2. No findings suspicious for omental or peritoneal surface disease.
No adenopathy.

## 2022-06-09 ENCOUNTER — Other Ambulatory Visit: Payer: Self-pay

## 2022-06-10 ENCOUNTER — Ambulatory Visit: Payer: Medicare PPO | Admitting: Hematology and Oncology

## 2022-06-10 ENCOUNTER — Other Ambulatory Visit: Payer: Medicare PPO

## 2022-06-10 ENCOUNTER — Ambulatory Visit: Payer: Medicare PPO

## 2022-06-24 ENCOUNTER — Inpatient Hospital Stay: Payer: Medicare PPO | Attending: Gynecologic Oncology

## 2022-06-24 ENCOUNTER — Encounter: Payer: Self-pay | Admitting: Hematology and Oncology

## 2022-06-24 ENCOUNTER — Inpatient Hospital Stay: Payer: Medicare PPO

## 2022-06-24 ENCOUNTER — Inpatient Hospital Stay (HOSPITAL_BASED_OUTPATIENT_CLINIC_OR_DEPARTMENT_OTHER): Payer: Medicare PPO | Admitting: Hematology and Oncology

## 2022-06-24 VITALS — BP 121/62 | HR 69 | Temp 97.6°F | Resp 18 | Ht 63.0 in | Wt 189.6 lb

## 2022-06-24 DIAGNOSIS — C7800 Secondary malignant neoplasm of unspecified lung: Secondary | ICD-10-CM | POA: Insufficient documentation

## 2022-06-24 DIAGNOSIS — Z79899 Other long term (current) drug therapy: Secondary | ICD-10-CM | POA: Diagnosis not present

## 2022-06-24 DIAGNOSIS — D539 Nutritional anemia, unspecified: Secondary | ICD-10-CM

## 2022-06-24 DIAGNOSIS — Z5112 Encounter for antineoplastic immunotherapy: Secondary | ICD-10-CM | POA: Insufficient documentation

## 2022-06-24 DIAGNOSIS — E039 Hypothyroidism, unspecified: Secondary | ICD-10-CM

## 2022-06-24 DIAGNOSIS — C55 Malignant neoplasm of uterus, part unspecified: Secondary | ICD-10-CM | POA: Diagnosis not present

## 2022-06-24 LAB — CMP (CANCER CENTER ONLY)
ALT: 13 U/L (ref 0–44)
AST: 15 U/L (ref 15–41)
Albumin: 3.9 g/dL (ref 3.5–5.0)
Alkaline Phosphatase: 103 U/L (ref 38–126)
Anion gap: 6 (ref 5–15)
BUN: 22 mg/dL (ref 8–23)
CO2: 26 mmol/L (ref 22–32)
Calcium: 9.3 mg/dL (ref 8.9–10.3)
Chloride: 109 mmol/L (ref 98–111)
Creatinine: 0.84 mg/dL (ref 0.44–1.00)
GFR, Estimated: >60 mL/min (ref >60)
Glucose, Bld: 87 mg/dL (ref 70–99)
Potassium: 3.9 mmol/L (ref 3.5–5.1)
Sodium: 141 mmol/L (ref 135–145)
Total Bilirubin: 0.4 mg/dL (ref 0.3–1.2)
Total Protein: 7.5 g/dL (ref 6.5–8.1)

## 2022-06-24 LAB — CBC WITH DIFFERENTIAL (CANCER CENTER ONLY)
Abs Immature Granulocytes: 0.01 10*3/uL (ref 0.00–0.07)
Basophils Absolute: 0.1 10*3/uL (ref 0.0–0.1)
Basophils Relative: 1 %
Eosinophils Absolute: 0.8 10*3/uL — ABNORMAL HIGH (ref 0.0–0.5)
Eosinophils Relative: 16 %
HCT: 31.4 % — ABNORMAL LOW (ref 36.0–46.0)
Hemoglobin: 10.1 g/dL — ABNORMAL LOW (ref 12.0–15.0)
Immature Granulocytes: 0 %
Lymphocytes Relative: 17 %
Lymphs Abs: 0.9 10*3/uL (ref 0.7–4.0)
MCH: 24.2 pg — ABNORMAL LOW (ref 26.0–34.0)
MCHC: 32.2 g/dL (ref 30.0–36.0)
MCV: 75.3 fL — ABNORMAL LOW (ref 80.0–100.0)
Monocytes Absolute: 0.4 10*3/uL (ref 0.1–1.0)
Monocytes Relative: 9 %
Neutro Abs: 2.9 10*3/uL (ref 1.7–7.7)
Neutrophils Relative %: 57 %
Platelet Count: 218 10*3/uL (ref 150–400)
RBC: 4.17 MIL/uL (ref 3.87–5.11)
RDW: 16.8 % — ABNORMAL HIGH (ref 11.5–15.5)
WBC Count: 5.1 10*3/uL (ref 4.0–10.5)
nRBC: 0 % (ref 0.0–0.2)

## 2022-06-24 LAB — TSH: TSH: 15.387 u[IU]/mL — ABNORMAL HIGH (ref 0.350–4.500)

## 2022-06-24 MED ORDER — SODIUM CHLORIDE 0.9% FLUSH
10.0000 mL | Freq: Once | INTRAVENOUS | Status: AC
  Administered 2022-06-24: 10 mL

## 2022-06-24 MED ORDER — SODIUM CHLORIDE 0.9% FLUSH
10.0000 mL | INTRAVENOUS | Status: DC | PRN
  Administered 2022-06-24: 10 mL

## 2022-06-24 MED ORDER — HEPARIN SOD (PORK) LOCK FLUSH 100 UNIT/ML IV SOLN
500.0000 [IU] | Freq: Once | INTRAVENOUS | Status: AC | PRN
  Administered 2022-06-24: 500 [IU]

## 2022-06-24 MED ORDER — SODIUM CHLORIDE 0.9 % IV SOLN: Freq: Once | INTRAVENOUS | Status: AC

## 2022-06-24 MED ORDER — SODIUM CHLORIDE 0.9 % IV SOLN
200.0000 mg | Freq: Once | INTRAVENOUS | Status: AC
  Administered 2022-06-24: 200 mg via INTRAVENOUS
  Filled 2022-06-24: qty 200

## 2022-06-24 NOTE — Patient Instructions (Signed)
[facility]ICAL [facility]COL[facility]  Discharge Instructions: Thank you for choosing [facility] to provide your oncology and hematology care.   If you have a lab appointment with the Cancer Center, please go directly to the Cancer Center and check in at the registration area.   Wear comfortable clothing and clothing appropriate for easy access to any Portacath or PICC line.   We strive to give you quality time with your provider. You may need to reschedule your appointment if you arrive late (15 or more minutes).  Arriving late affects you and other patients whose appointments are after yours.  Also, if you miss three or more appointments without notifying the office, you may be dismissed from the clinic at the provider's discretion.      For prescription refill requests, have your pharmacy contact our office and allow 72 hours for refills to be completed.    Today you received the following chemotherapy and/or immunotherapy agents: Keytruda.       To help prevent nausea and vomiting after your treatment, we encourage you to take your nausea medication as directed.  BELOW ARE SYMPTOMS THAT SHOULD BE REPORTED IMMEDIATELY: *FEVER GREATER THAN 100.4 F (38 C) OR HIGHER *CHILLS OR SWEATING *NAUSEA AND VOMITING THAT IS NOT CONTROLLED WITH YOUR NAUSEA MEDICATION *UNUSUAL SHORTNESS OF BREATH *UNUSUAL BRUISING OR BLEEDING *URINARY PROBLEMS (pain or burning when urinating, or frequent urination) *BOWEL PROBLEMS (unusual diarrhea, constipation, pain near the anus) TENDERNESS IN MOUTH AND THROAT WITH OR WITHOUT PRESENCE OF ULCERS (sore throat, sores in mouth, or a toothache) UNUSUAL RASH, SWELLING OR PAIN  UNUSUAL VAGINAL DISCHARGE OR ITCHING   Items with * indicate a potential emergency and should be followed up as soon as possible or go to the Emergency Department if any problems should occur.  Please show the CHEMOTHERAPY ALERT CARD or IMMUNOTHERAPY ALERT CARD at check-in to  the Emergency Department and triage nurse.  Should you have questions after your visit or need to cancel or reschedule your appointment, please contact [facility]  Dept: [phone]  and follow the prompts.  Office hours are 8:00 a.m. to 4:30 p.m. Monday - Friday. Please note that voicemails left after 4:00 p.m. may not be returned until the following business day.  We are closed weekends and major holidays. You have access to a nurse at all times for urgent questions. Please call the main number to the clinic Dept: [phone] and follow the prompts.   For any non-urgent questions, you may also contact your provider using MyChart. We now offer e-Visits for anyone 18 and older to request care online for non-urgent symptoms. For details visit [email or web address].   Also download the MyChart app! Go to the app store, search "MyChart", open the app, select [facility], and log in with your MyChart username and password.  Masks are optional in the cancer centers. If you would like for your care team to wear a mask while they are taking care of you, please let them know. You may have one support Amadi Yoshino who is at least 70 years old accompany you for your appointments. 

## 2022-06-24 NOTE — Assessment & Plan Note (Signed)
She has multifactorial anemia and pancytopenia She will continue vitamin B12 injection every few months

## 2022-06-24 NOTE — Progress Notes (Signed)
[facility] OFFICE PROGRESS NOTE  Patient Care Team: Pcp, No as PCP - General  ASSESSMENT & PLAN:  Uterine cancer ([place]) Her recent CT imaging from last month which show good response to therapy except for a lung nodule and abnormal pelvic lymphadenopathy which I suspect is related to recent cellulitis of her lower extremity We will resume pembrolizumab today I plan to repeat imaging study again middle January  Hypothyroidism We will recheck her thyroid function today and I will adjust the dose of Synthroid as needed  Deficiency anemia She has multifactorial anemia and pancytopenia She will continue vitamin B12 injection every few months   No orders of the defined types were placed in this encounter.   All questions were answered. The patient knows to call the clinic with any problems, questions or concerns. The total time spent in the appointment was 20 minutes encounter with patients including review of chart and various tests results, discussions about plan of care and coordination of care plan   [name], MD 06/24/2022 3:41 PM  INTERVAL HISTORY: Please see below for problem oriented charting. she returns for treatment follow-up on maintenance pembrolizumab Her cellulitis has resolved She is doing well She denies pelvic pain  REVIEW OF SYSTEMS:   Constitutional: Denies fevers, chills or abnormal weight loss Eyes: Denies blurriness of vision Ears, nose, mouth, throat, and face: Denies mucositis or sore throat Respiratory: Denies cough, dyspnea or wheezes Cardiovascular: Denies palpitation, chest discomfort or lower extremity swelling Gastrointestinal:  Denies nausea, heartburn or change in bowel habits Skin: Denies abnormal skin rashes Lymphatics: Denies new lymphadenopathy or easy bruising Neurological:Denies numbness, tingling or new weaknesses Behavioral/Psych: Mood is stable, no new changes  All other systems were reviewed with the patient and are  negative.  I have reviewed the past medical history, past surgical history, social history and family history with the patient and they are unchanged from previous note.  ALLERGIES:  has No Known Allergies.  MEDICATIONS:  Current Outpatient Medications  Medication Sig Dispense Refill   acetaminophen (TYLENOL) 325 MG tablet Take 650 mg by mouth at bedtime as needed (pain).     levothyroxine (SYNTHROID) 125 MCG tablet Take 1 tablet (125 mcg total) by mouth daily before breakfast. 30 tablet 3   magnesium hydroxide (MILK OF MAGNESIA) 400 MG/5ML suspension Take 30 mLs by mouth daily.     polyethylene glycol (MIRALAX / GLYCOLAX) 17 g packet Take 17 g by mouth daily. (Patient taking differently: Take 17 g by mouth daily as needed for mild constipation.) 30 each 1   No current facility-administered medications for this visit.   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   Uterine cancer ([place])  01/05/2021 Imaging   Outside CT imaging  Bilateral hydronephrosis related extrinsic compression on the ureters by the enlarged uterus.   Multiple calcified uterine fibroids although peripherally enhancing diffuse decreased attenuation is noted centrally within the uterus highly suspicious for endometrial neoplasm given the current history of vaginal bleeding. Further evaluation  is recommended. MRI may be helpful. Additionally tissue sampling would likely be helpful.   Hypodensity in the pancreas  at the junction of the head and body of the pancreas. This may be related to volume averaging although the possibility of a cystic lesion could not be totally excluded. MRI is recommended for further evaluation.    01/05/2021 Initial Diagnosis   The patient reported at least 1 year of postmenopausal bleeding.  She does not have a primary care provider or gynecologist.  She had known fibroids and had noted that she needed a hysterectomy but had not gotten around to seeking care for this.  She presented to the [facility] emergency department on January 05, 2021 with new onset right lower quadrant pain and this prompted a CT scan of the abdomen and pelvis to be performed on 01/05/2021.     01/14/2021 Pathology Results   FINAL MICROSCOPIC DIAGNOSIS:   A. CERVIX, ENDOCERVIX, BIOPSY:  -  Endometrioid carcinoma, FIGO grade 1  -  See comment    01/26/2021 Imaging   MRI abdomen  1. No suspicious pancreatic mass identified. 2. Moderate to severe bilateral hydroureteronephrosis, similar to recent CT the abdomen and pelvis, presumably related to extrinsic ureteral compression from the patient's enlarged uterus, as demonstrated on recent CT the abdomen and pelvis 01/05/2021.   02/01/2021 Surgery   Surgery in [facility]  Date of Service: February 01, 2021 3:13 PM  Preoperative Diagnoses: Endometrial cancer  Postoperative Diagnoses: Endometrial cancer  Procedures: Total abdominal hysterectomy, bilateral salpingo-oophorectomy, left pelvic lymphadenectomy, right para-aortic lymphadenectomy  Surgeon: Everitt Amber, MD  Findings: Tumor appreciated in the cervical canal. Large, broad, multi fibroid uterus with a calcified fibroid filling the posterior cul de sac, an anterior lower uterine segment fibroid, and bilateral fibroids near the level of the cardinal ligament. No evidence of intraperitoneal disease. Normal appearing bilateral tubes and ovaries. Bilateral hydroureter from uterine compression. Tumor excreted from specimen into  the pelvis at time of removal. An approximately 2cm enlarged left pelvic lymph node and a 1cm enlarged right aorta caval lymph node were identified.     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) - Right ovary, negative for tumor - Bilateral fallopian tubes, negative for tumor - Multiple leiomyomata (up to 8.5 cm)   B: Lymph node, aortocaval, excision - Metastatic carcinoma involving 1 lymph node (0.4 cm) (1/1)   C: Lymph node, left pelvic, excision -Metastatic carcinoma involving 1 lymph node (0.3 cm) (1/1)   Diagnosis Comment    The majority of this tumor is composed of well differentiated (overall grade 2, but with areas consistent with grade 1) endometrioid adenocarcinoma with squamous and mucinous differentiation (approximately 70%); multifocally there is abrupt transition to an undifferentiated malignancy with a high-grade monotonous noncohesive appearance (best seen in slide A2) consistent with undifferentiated carcinoma.  The overall morphologic and immunophenotypic findings are consistent with a dedifferentiated endometrial carcinoma.   Of note, the carcinoma present in the lymph nodes is well differentiated endometrioid carcinoma and the metastatic deposit in the ovary is undifferentiated carcinoma. Carcinoma is present less than 1 mm from uterine corpus serosa (in the lower uterine segment).  The assessment of the paracervical soft tissue margins is challenging due  to the disrupted nature of the specimen in this area, however tumor appears present less than 1 mm from paracervical soft tissue.  No definite parametrial invasion is  identified.   Immunohistochemistry performed at [facility] (with appropriate controls) demonstrates the following immunoprofile in cells of interest: Well differentiated endometrioid carcinoma: CK AE1/AE3: Positive ER: Positive (>90%, strong staining) P53: Favor wild-type (see comment below)   Dedifferentiated carcinoma: CK AE1/AE3: Very rare cells, predominantly negative ER: Negative P53: Small focus of mutant type staining in a background of wild-type staining (see comment below)   P53 staining comment: In the well differentiated carcinoma component the staining pattern is predominantly wild-type although focally there is slightly increased levels of staining noted.  In the dedifferentiated tumor component, most of the tissue exhibits wild-type pattern staining, however there is a distinct small area that exhibits increased/overexpressed mutant pattern p53 staining.    Immunohistochemistry testing for mismatch repair (MMR) proteins is performed on block A2:   MLH1:  Loss of nuclear expression MSH2:  Intact nuclear expression MSH6:  Intact nuclear expression PMS2:  Loss of nuclear expression   Loss of nuclear expression of MLH1 and PMS2 can be a result of sporadic microsatellite instability (via hypermethylation of the MLH1 promoter region) or due to Lynch syndrome / [place]. Materials will be submitted to the Molecular Pathology Laboratory [id number]) for [facility] promoter hypermethylation testing. These results will be issued in a separate report.      03/05/2021 Initial Diagnosis   Uterine cancer ([place])   03/05/2021 Cancer Staging   Staging form: Corpus Uteri - Carcinoma and Carcinosarcoma, AJCC 8th Edition - Pathologic stage from 03/05/2021: FIGO Stage IVB (rpT3, pN2a, cM1) - Signed by [name], MD on 11/12/2021 Stage prefix: Recurrence   03/11/2021 Imaging   1. Status post interval hysterectomy and oophorectomy. 2. There is a rim enhancing soft tissue mass centered in the vagina, measuring  approximately 4.4 x 3.1 cm. This is new, or significantly enlarged compared to prior examination dated 01/05/2021. Findings are concerning for malignant involvement. Correlate with physical examination. Contrast enhanced MRI may be helpful to further detail soft tissue anatomy of the low pelvis if indicated. 3. There is at least one newly enlarged left iliac lymph node measuring up to 1.4 x 1.3 cm, nonspecific although concerning for nodal metastatic disease. 4. Fluid attenuation lymphocele or seroma adjacent to surgical clip at the level right ovarian vein. 5. Status post interval midline laparotomy. Subcutaneous fluid within the inferior aspect of the wound. The presence or absence of infection is not established by CT.     03/23/2021 - 07/09/2021 Chemotherapy   Patient is on Treatment Plan : UTERINE Carboplatin AUC 6 / Paclitaxel q21d     03/23/2021 Procedure   Placement of a subcutaneous power-injectable port device. Catheter tip at the superior cavoatrial junction   07/30/2021 Imaging   1. Large ill-defined partially enhancing and likely centrally necrotic vaginal and paravaginal mass. Recommend correlation with direct visualization. MRI pelvis without and with contrast may be helpful for further evaluation. 2. No findings suspicious for omental or peritoneal surface disease. No adenopathy.   08/09/2021 - 09/08/2021 Radiation Therapy   Indication for treatment:  Curative       Radiation treatment dates:   08/09/21 through 09/08/21 Site/dose:  IMRT Uterus; Vagina : 41.5 Gy delivered in 23 Fx with 1.80 Gy/Fx (Planned dosage was for 45.00 GY delivered in 25 Fx with 1.80 Gy/Fx, her final 2 treatments (and boost) was canceled secondary to significant radiation reaction)  Beams/energy: 6X   11/11/2021 Imaging   1. 11 mm left upper lobe pulmonary nodule in the apicoposterior segment abuts the posterior pleural margin. In this clinical context the appearance is very concerning for a metastatic lesion  although we do not have prior cross-sectional imaging to assess stability. Proximity to the fifth rib and adjacent major fissure might make percutaneous biopsy tricky; PET-CT would also be one way of assessing the metabolic activity/malignant potential of this lesion. This may be a good candidate case for discussion at multidisciplinary cancer conference. 2. Increased adenopathy along the left iliac chain, including an internal iliac node which abuts the left sacral plexus. 3. The lower vaginal and vestibular mass measures about 7.0 by 5.5 by 5.9 cm and has some internal gas which may indicate ulceration along the vestibule/perineum. Poor definition of tissue planes between this process in the base of the urinary bladder as well as the anus. The mass extends along the left anterior obturator internus muscle. 4. Other imaging findings of potential clinical significance: Prominent main pulmonary artery, cannot exclude pulmonary arterial hypertension. Aortic Atherosclerosis (ICD10-I70.0). Lower lumbar spondylosis and degenerative disc disease.   11/19/2021 - 03/11/2022 Chemotherapy   Patient is on Treatment Plan : UTERINE Pembrolizumab (200) q21d     11/19/2021 -  Chemotherapy   Patient is on Treatment Plan : UTERINE Pembrolizumab (200) q21d     01/21/2022 Imaging   1. Status post hysterectomy. Generally diminished size of an ulcerated mass involving the vagina when compared to prior examination, difficult to accurately measure due to poor resolution of adjacent soft tissues. 2. Significant interval decrease in size of left pelvic sidewall and iliac lymphadenopathy. 3. Interval enlargement of a pulmonary nodule of the posterior left upper lobe. 4. 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 5. New, diffuse urinary bladder wall thickening and  adjacent fat stranding, consistent with nonspecific infectious or inflammatory cystitis, most likely related to local radiation therapy.   04/22/2022 Imaging   1. Resolution of previously noted soft tissue mass associated with the apex of the vagina. 2. Regression of previously noted left upper lobe pulmonary nodule. However, there are multiple new satellite nodules in the adjacent lung parenchyma in the upper left lung, largest of which measures 1.2 x 0.6 cm in the superior segment of the left lower lobe. Close attention on follow-up studies is recommended. 3. Interval development of mildly enlarged bilateral inguinal lymph nodes. These could be reactive, however, the possibility of locoregional nodal metastases is not excluded, and close attention on follow-up imaging is recommended to ensure regression of these findings. 4. Aortic atherosclerosis, in addition to left anterior descending coronary artery disease. Assessment for potential risk factor modification, dietary therapy or pharmacologic therapy may be warranted, if clinically indicated.  5. Additional incidental findings, as above.     PHYSICAL EXAMINATION: ECOG PERFORMANCE STATUS: 1 - Symptomatic but completely ambulatory  Vitals:   06/24/22 1156  BP: 121/62  Pulse: 69  Resp: 18  Temp: 97.6 F (36.4 C)  SpO2: 100%   Filed Weights   06/24/22 1156  Weight: 189 lb 9.6 oz (86 kg)    GENERAL:alert, no distress and comfortable SKIN: skin color, texture, turgor are normal, no rashes or significant lesions EYES: normal, Conjunctiva are pink and non-injected, sclera clear OROPHARYNX:no exudate, no erythema and lips, buccal mucosa, and tongue normal  NECK: supple, thyroid normal  size, non-tender, without nodularity LYMPH:  no palpable lymphadenopathy in the cervical, axillary or inguinal LUNGS: clear to auscultation and percussion with normal breathing effort HEART: regular rate & rhythm and no murmurs and no lower extremity  edema ABDOMEN:abdomen soft, non-tender and normal bowel sounds Musculoskeletal:no cyanosis of digits and no clubbing  NEURO: alert & oriented x 3 with fluent speech, no focal motor/sensory deficits  LABORATORY DATA:  I have reviewed the data as listed    Component Value Date/Time   NA 141 06/24/2022 1140   K 3.9 06/24/2022 1140   CL 109 06/24/2022 1140   CO2 26 06/24/2022 1140   GLUCOSE 87 06/24/2022 1140   BUN 22 06/24/2022 1140   CREATININE 0.84 06/24/2022 1140   CALCIUM 9.3 06/24/2022 1140   PROT 7.5 06/24/2022 1140   ALBUMIN 3.9 06/24/2022 1140   AST 15 06/24/2022 1140   ALT 13 06/24/2022 1140   ALKPHOS 103 06/24/2022 1140   BILITOT 0.4 06/24/2022 1140   GFRNONAA >60 06/24/2022 1140    No results found for: "SPEP", "UPEP"  Lab Results  Component Value Date   WBC 5.1 06/24/2022   NEUTROABS 2.9 06/24/2022   HGB 10.1 (L) 06/24/2022   HCT 31.4 (L) 06/24/2022   MCV 75.3 (L) 06/24/2022   PLT 218 06/24/2022      Chemistry      Component Value Date/Time   NA 141 06/24/2022 1140   K 3.9 06/24/2022 1140   CL 109 06/24/2022 1140   CO2 26 06/24/2022 1140   BUN 22 06/24/2022 1140   CREATININE 0.84 06/24/2022 1140      Component Value Date/Time   CALCIUM 9.3 06/24/2022 1140   ALKPHOS 103 06/24/2022 1140   AST 15 06/24/2022 1140   ALT 13 06/24/2022 1140   BILITOT 0.4 06/24/2022 1140

## 2022-06-24 NOTE — Assessment & Plan Note (Signed)
Her recent CT imaging from last month which show good response to therapy except for a lung nodule and abnormal pelvic lymphadenopathy which I suspect is related to recent cellulitis of her lower extremity We will resume pembrolizumab today I plan to repeat imaging study again middle January

## 2022-06-24 NOTE — Assessment & Plan Note (Signed)
We will recheck her thyroid function today and I will adjust the dose of Synthroid as needed

## 2022-06-25 LAB — T4: T4, Total: 7.3 ug/dL (ref 4.5–12.0)

## 2022-07-07 ENCOUNTER — Other Ambulatory Visit: Payer: Self-pay

## 2022-07-15 ENCOUNTER — Encounter: Payer: Self-pay | Admitting: Hematology and Oncology

## 2022-07-15 ENCOUNTER — Inpatient Hospital Stay: Payer: Medicare PPO | Attending: Gynecologic Oncology

## 2022-07-15 ENCOUNTER — Inpatient Hospital Stay: Payer: Medicare PPO | Admitting: Hematology and Oncology

## 2022-07-15 ENCOUNTER — Inpatient Hospital Stay: Payer: Medicare PPO

## 2022-07-26 ENCOUNTER — Telehealth: Payer: Self-pay

## 2022-07-26 NOTE — Telephone Encounter (Signed)
Returned her call. She is calling to see when her next appt is scheduled. Told her she missed 1/5 appt. She is not sure why she missed the appts. Sent scheduling message to reschedule appts.  At her next appt she wants to show Dr. Alvy Bimler a nodule area to her genital area. Just FYI

## 2022-07-27 ENCOUNTER — Other Ambulatory Visit: Payer: Self-pay

## 2022-07-29 ENCOUNTER — Other Ambulatory Visit: Payer: Self-pay

## 2022-08-12 ENCOUNTER — Telehealth: Payer: Self-pay

## 2022-08-12 ENCOUNTER — Other Ambulatory Visit: Payer: Self-pay

## 2022-08-12 ENCOUNTER — Inpatient Hospital Stay (HOSPITAL_BASED_OUTPATIENT_CLINIC_OR_DEPARTMENT_OTHER): Payer: Medicare PPO | Admitting: Hematology and Oncology

## 2022-08-12 ENCOUNTER — Other Ambulatory Visit: Payer: Self-pay | Admitting: Hematology and Oncology

## 2022-08-12 ENCOUNTER — Inpatient Hospital Stay: Payer: Medicare PPO | Attending: Gynecologic Oncology

## 2022-08-12 ENCOUNTER — Encounter: Payer: Self-pay | Admitting: Hematology and Oncology

## 2022-08-12 ENCOUNTER — Inpatient Hospital Stay: Payer: Medicare PPO

## 2022-08-12 VITALS — BP 116/54 | HR 73 | Temp 97.9°F | Resp 18

## 2022-08-12 VITALS — BP 131/74 | HR 64 | Temp 97.4°F | Resp 18 | Ht 63.0 in | Wt 203.4 lb

## 2022-08-12 DIAGNOSIS — E039 Hypothyroidism, unspecified: Secondary | ICD-10-CM | POA: Insufficient documentation

## 2022-08-12 DIAGNOSIS — C55 Malignant neoplasm of uterus, part unspecified: Secondary | ICD-10-CM | POA: Insufficient documentation

## 2022-08-12 DIAGNOSIS — D539 Nutritional anemia, unspecified: Secondary | ICD-10-CM | POA: Insufficient documentation

## 2022-08-12 DIAGNOSIS — Z5112 Encounter for antineoplastic immunotherapy: Secondary | ICD-10-CM | POA: Insufficient documentation

## 2022-08-12 DIAGNOSIS — Z79899 Other long term (current) drug therapy: Secondary | ICD-10-CM | POA: Insufficient documentation

## 2022-08-12 DIAGNOSIS — C7800 Secondary malignant neoplasm of unspecified lung: Secondary | ICD-10-CM | POA: Diagnosis not present

## 2022-08-12 LAB — CMP (CANCER CENTER ONLY)
ALT: 12 U/L (ref 0–44)
AST: 12 U/L — ABNORMAL LOW (ref 15–41)
Albumin: 3.7 g/dL (ref 3.5–5.0)
Alkaline Phosphatase: 93 U/L (ref 38–126)
Anion gap: 4 — ABNORMAL LOW (ref 5–15)
BUN: 19 mg/dL (ref 8–23)
CO2: 27 mmol/L (ref 22–32)
Calcium: 9.2 mg/dL (ref 8.9–10.3)
Chloride: 110 mmol/L (ref 98–111)
Creatinine: 0.74 mg/dL (ref 0.44–1.00)
GFR, Estimated: >60 mL/min (ref >60)
Glucose, Bld: 72 mg/dL (ref 70–99)
Potassium: 4 mmol/L (ref 3.5–5.1)
Sodium: 141 mmol/L (ref 135–145)
Total Bilirubin: 0.3 mg/dL (ref 0.3–1.2)
Total Protein: 6.8 g/dL (ref 6.5–8.1)

## 2022-08-12 LAB — CBC WITH DIFFERENTIAL (CANCER CENTER ONLY)
Abs Immature Granulocytes: 0.01 10*3/uL (ref 0.00–0.07)
Basophils Absolute: 0.1 10*3/uL (ref 0.0–0.1)
Basophils Relative: 2 %
Eosinophils Absolute: 0.2 10*3/uL (ref 0.0–0.5)
Eosinophils Relative: 7 %
HCT: 33.1 % — ABNORMAL LOW (ref 36.0–46.0)
Hemoglobin: 10.5 g/dL — ABNORMAL LOW (ref 12.0–15.0)
Immature Granulocytes: 0 %
Lymphocytes Relative: 33 %
Lymphs Abs: 1 10*3/uL (ref 0.7–4.0)
MCH: 23.4 pg — ABNORMAL LOW (ref 26.0–34.0)
MCHC: 31.7 g/dL (ref 30.0–36.0)
MCV: 73.7 fL — ABNORMAL LOW (ref 80.0–100.0)
Monocytes Absolute: 0.3 10*3/uL (ref 0.1–1.0)
Monocytes Relative: 10 %
Neutro Abs: 1.5 10*3/uL — ABNORMAL LOW (ref 1.7–7.7)
Neutrophils Relative %: 48 %
Platelet Count: 219 10*3/uL (ref 150–400)
RBC: 4.49 MIL/uL (ref 3.87–5.11)
RDW: 17 % — ABNORMAL HIGH (ref 11.5–15.5)
WBC Count: 3.1 10*3/uL — ABNORMAL LOW (ref 4.0–10.5)
nRBC: 0 % (ref 0.0–0.2)

## 2022-08-12 LAB — VITAMIN B12: Vitamin B-12: 181 pg/mL (ref 180–914)

## 2022-08-12 LAB — TSH: TSH: 2.543 u[IU]/mL (ref 0.350–4.500)

## 2022-08-12 MED ORDER — SODIUM CHLORIDE 0.9% FLUSH: 10.0000 mL | INTRAVENOUS | Status: DC | PRN

## 2022-08-12 MED ORDER — LEVOTHYROXINE SODIUM 125 MCG PO TABS: 125.0000 ug | ORAL_TABLET | Freq: Every day | ORAL | 3 refills | Status: DC

## 2022-08-12 MED ORDER — SODIUM CHLORIDE 0.9 % IV SOLN
200.0000 mg | Freq: Once | INTRAVENOUS | Status: AC
  Administered 2022-08-12: 200 mg via INTRAVENOUS
  Filled 2022-08-12: qty 8

## 2022-08-12 MED ORDER — SODIUM CHLORIDE 0.9 % IV SOLN: Freq: Once | INTRAVENOUS | Status: AC

## 2022-08-12 MED ORDER — SODIUM CHLORIDE 0.9% FLUSH
10.0000 mL | Freq: Once | INTRAVENOUS | Status: AC
  Administered 2022-08-12: 10 mL

## 2022-08-12 MED ORDER — HEPARIN SOD (PORK) LOCK FLUSH 100 UNIT/ML IV SOLN: 500.0000 [IU] | Freq: Once | INTRAVENOUS | Status: DC | PRN

## 2022-08-12 NOTE — Assessment & Plan Note (Signed)
Overall, she is not symptomatic She has no pelvic pain She will proceed with treatment today I recommend repeat CT imaging before her next treatment

## 2022-08-12 NOTE — Patient Instructions (Signed)
[facility]  Discharge Instructions: Thank you for choosing [facility] to provide your oncology and hematology care.   If you have a lab appointment with the [facility], please go directly to the [facility] and check in at the registration area.   Wear comfortable clothing and clothing appropriate for easy access to any Portacath or PICC line.   We strive to give you quality time with your provider. You may need to reschedule your appointment if you arrive late (15 or more minutes).  Arriving late affects you and other patients whose appointments are after yours.  Also, if you miss three or more appointments without notifying the office, you may be dismissed from the clinic at the provider's discretion.      For prescription refill requests, have your pharmacy contact our office and allow 72 hours for refills to be completed.    Today you received the following chemotherapy and/or immunotherapy agents: Keytruda      To help prevent nausea and vomiting after your treatment, we encourage you to take your nausea medication as directed.  BELOW ARE SYMPTOMS THAT SHOULD BE REPORTED IMMEDIATELY: *FEVER GREATER THAN 100.4 F (38 C) OR HIGHER *CHILLS OR SWEATING *NAUSEA AND VOMITING THAT IS NOT CONTROLLED WITH YOUR NAUSEA MEDICATION *UNUSUAL SHORTNESS OF BREATH *UNUSUAL BRUISING OR BLEEDING *URINARY PROBLEMS (pain or burning when urinating, or frequent urination) *BOWEL PROBLEMS (unusual diarrhea, constipation, pain near the anus) TENDERNESS IN MOUTH AND THROAT WITH OR WITHOUT PRESENCE OF ULCERS (sore throat, sores in mouth, or a toothache) UNUSUAL RASH, SWELLING OR PAIN  UNUSUAL VAGINAL DISCHARGE OR ITCHING   Items with * indicate a potential emergency and should be followed up as soon as possible or go to the Emergency Department if any problems should occur.  Please show the CHEMOTHERAPY ALERT CARD or IMMUNOTHERAPY ALERT CARD at  check-in to the Emergency Department and triage nurse.  Should you have questions after your visit or need to cancel or reschedule your appointment, please contact [facility]  Dept: [phone]  and follow the prompts.  Office hours are 8:00 a.m. to 4:30 p.m. Monday - Friday. Please note that voicemails left after 4:00 p.m. may not be returned until the following business day.  We are closed weekends and major holidays. You have access to a nurse at all times for urgent questions. Please call the main number to the clinic Dept: [phone] and follow the prompts.   For any non-urgent questions, you may also contact your provider using MyChart. We now offer e-Visits for anyone 14 and older to request care online for non-urgent symptoms. For details visit [email or web address].   Also download the MyChart app! Go to the app store, search "MyChart", open the app, select [facility], and log in with your MyChart username and password.

## 2022-08-12 NOTE — Assessment & Plan Note (Signed)
She is getting a little pancytopenic I will check vitamin B12 level

## 2022-08-12 NOTE — Telephone Encounter (Signed)
Called and LVMs with each of Pt's contacts listed. Asked for them to reach out to Pt to have her call us, gave call back number.  Person who answered Arlie Solomons number as listed states he no longer has this number and does not have his updated information.  Number listed for [facility] no longer in service.

## 2022-08-12 NOTE — Progress Notes (Signed)
[facility] OFFICE PROGRESS NOTE  Patient Care Team: Pcp, No as PCP - General  ASSESSMENT & PLAN:  Uterine cancer ([place]) Overall, she is not symptomatic She has no pelvic pain She will proceed with treatment today I recommend repeat CT imaging before her next treatment  Deficiency anemia She is getting a little pancytopenic I will check vitamin B12 level  Hypothyroidism She has gained some weight TSH is pending I will call her with test results and adjust the dose of Synthroid as needed  Orders Placed This Encounter  Procedures   CT CHEST ABDOMEN PELVIS W CONTRAST    Standing Status:   Future    Standing Expiration Date:   08/13/2023    Order Specific Question:   Preferred imaging location?    Answer:   [facility]    Order Specific Question:   Radiology Contrast Protocol - do NOT remove file path    Answer:   [email or web address]   CBC with Differential (Cancer Center Only)    Standing Status:   Future    Standing Expiration Date:   09/03/2023   CMP ([facility] only)    Standing Status:   Future    Standing Expiration Date:   09/03/2023   T4    Standing Status:   Future    Standing Expiration Date:   09/03/2023   TSH    Standing Status:   Future    Standing Expiration Date:   09/03/2023   Vitamin B12    Standing Status:   Future    Number of Occurrences:   1    Standing Expiration Date:   08/13/2023    All questions were answered. The patient knows to call the clinic with any problems, questions or concerns. The total time spent in the appointment was 20 minutes encounter with patients including review of chart and various tests results, discussions about plan of care and coordination of care plan   [name], MD 08/12/2022 11:49 AM  INTERVAL HISTORY: Please see below for problem oriented charting. she returns for treatment follow-up on treatment with pembrolizumab She missed her appointment recently She  is doing well She is gaining weight She is not symptomatic from anemia  REVIEW OF SYSTEMS:   Constitutional: Denies fevers, chills or abnormal weight loss Eyes: Denies blurriness of vision Ears, nose, mouth, throat, and face: Denies mucositis or sore throat Respiratory: Denies cough, dyspnea or wheezes Cardiovascular: Denies palpitation, chest discomfort or lower extremity swelling Gastrointestinal:  Denies nausea, heartburn or change in bowel habits Skin: Denies abnormal skin rashes Lymphatics: Denies new lymphadenopathy or easy bruising Neurological:Denies numbness, tingling or new weaknesses Behavioral/Psych: Mood is stable, no new changes  All other systems were reviewed with the patient and are negative.  I have reviewed the past medical history, past surgical history, social history and family history with the patient and they are unchanged from previous note.  ALLERGIES:  has No Known Allergies.  MEDICATIONS:  Current Outpatient Medications  Medication Sig Dispense Refill   acetaminophen (TYLENOL) 325 MG tablet Take 650 mg by mouth at bedtime as needed (pain).     levothyroxine (SYNTHROID) 125 MCG tablet Take 1 tablet (125 mcg total) by mouth daily before breakfast. 30 tablet 3   magnesium hydroxide (MILK OF MAGNESIA) 400 MG/5ML suspension Take 30 mLs by mouth daily.     polyethylene glycol (MIRALAX / GLYCOLAX) 17 g packet Take 17 g by mouth daily. (Patient taking differently: Take 17 g by mouth  daily as needed for mild constipation.) 30 each 1   No current facility-administered medications for this visit.   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   Uterine cancer ([place])  01/05/2021 Imaging   Outside CT imaging  Bilateral hydronephrosis related extrinsic compression on the ureters by the enlarged uterus.   Multiple calcified uterine fibroids although peripherally enhancing diffuse decreased attenuation is noted centrally within the uterus highly suspicious for endometrial neoplasm given the current history of vaginal bleeding. Further evaluation is recommended. MRI may be helpful. Additionally tissue sampling would likely be helpful.   Hypodensity in the pancreas at the junction of the head and body of the pancreas. This may be related to volume averaging although the possibility of a cystic lesion could not be totally excluded. MRI is recommended for further evaluation.    01/05/2021 Initial Diagnosis   The patient reported at least 1 year of postmenopausal bleeding.  She does not have a primary care provider or gynecologist.  She had known fibroids and had noted that she needed a hysterectomy but had not gotten around to seeking care for this.  She presented to the [facility] emergency department on January 05, 2021 with new onset right lower quadrant pain and this prompted a CT scan of the abdomen and pelvis to be performed on 01/05/2021.     01/14/2021 Pathology Results   FINAL MICROSCOPIC DIAGNOSIS:   A. CERVIX, ENDOCERVIX, BIOPSY:  -  Endometrioid carcinoma, FIGO grade 1  -  See comment    01/26/2021 Imaging   MRI abdomen  1. No suspicious pancreatic mass identified. 2. Moderate to severe bilateral hydroureteronephrosis, similar to recent CT the abdomen and pelvis, presumably related to extrinsic ureteral compression from the patient's enlarged uterus, as demonstrated on recent CT the  abdomen and pelvis 01/05/2021.   02/01/2021 Surgery   Surgery in [facility]  Date of Service: February 01, 2021 3:13 PM  Preoperative Diagnoses: Endometrial cancer  Postoperative Diagnoses: Endometrial cancer  Procedures: Total abdominal hysterectomy, bilateral salpingo-oophorectomy, left pelvic lymphadenectomy, right para-aortic lymphadenectomy  Surgeon: Everitt Amber, MD  Findings: Tumor appreciated in the cervical canal. Large, broad, multi fibroid uterus with a calcified fibroid filling the posterior cul de sac, an anterior lower uterine segment fibroid, and bilateral fibroids near the level of the cardinal ligament. No evidence of intraperitoneal disease. Normal appearing bilateral tubes and ovaries. Bilateral hydroureter from uterine compression. Tumor excreted from specimen into the pelvis at time of removal. An approximately 2cm enlarged left pelvic lymph node and a 1cm enlarged right aorta caval lymph node were identified.     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 (0.4 cm) - Right ovary, negative for tumor - Bilateral fallopian tubes, negative for tumor - Multiple leiomyomata (up to 8.5 cm)   B: Lymph node, aortocaval, excision - Metastatic carcinoma involving 1 lymph node (0.4 cm) (1/1)   C: Lymph node, left pelvic, excision -Metastatic carcinoma involving 1 lymph node (0.3 cm) (1/1)   Diagnosis Comment    The majority of this tumor is composed of well differentiated (overall grade 2, but with areas consistent with grade 1) endometrioid adenocarcinoma with squamous and mucinous differentiation (approximately 70%); multifocally there is abrupt  transition to an undifferentiated malignancy with a high-grade monotonous noncohesive appearance (best seen in slide A2) consistent with undifferentiated carcinoma.  The overall morphologic and immunophenotypic findings are consistent with a dedifferentiated endometrial carcinoma.   Of note, the carcinoma present in the lymph nodes is well differentiated endometrioid carcinoma and the metastatic deposit in the ovary is undifferentiated carcinoma. Carcinoma is present less than 1 mm from uterine corpus serosa (in the lower uterine segment).  The assessment of the paracervical soft tissue margins is challenging due to the disrupted nature of the specimen in this area, however tumor appears present less than 1 mm from paracervical soft tissue.  No definite parametrial invasion is identified.   Immunohistochemistry performed at [facility] (with appropriate controls) demonstrates the following immunoprofile in cells of interest: Well differentiated endometrioid carcinoma: CK AE1/AE3: Positive ER: Positive (>90%, strong staining) P53: Favor wild-type (see comment below)   Dedifferentiated carcinoma: CK AE1/AE3: Very rare cells, predominantly negative ER: Negative P53: Small focus of mutant type staining in a background of wild-type staining (see comment below)   P53 staining comment: In the well differentiated carcinoma component the staining pattern is predominantly wild-type although focally there is slightly increased levels of staining noted.  In the dedifferentiated tumor component, most of the tissue exhibits wild-type pattern staining, however there is a distinct small area that exhibits increased/overexpressed mutant pattern p53 staining.    Immunohistochemistry testing for mismatch repair (MMR) proteins is performed on block A2:   MLH1:  Loss of nuclear expression MSH2:  Intact nuclear expression MSH6:  Intact nuclear expression PMS2:  Loss of nuclear expression   Loss of nuclear expression of  MLH1 and PMS2 can be a result of sporadic microsatellite instability (via hypermethylation of the MLH1 promoter region) or due to Lynch syndrome / [place]. Materials will be submitted to the Molecular Pathology Laboratory [phone]) for [facility] promoter hypermethylation testing. These results will be issued in a separate report.      03/05/2021 Initial Diagnosis   Uterine cancer ([place])   03/05/2021 Cancer Staging   Staging form: Corpus Uteri - Carcinoma and Carcinosarcoma, AJCC 8th Edition - Pathologic stage from 03/05/2021: FIGO Stage IVB (rpT3, pN2a, cM1) - Signed by [name], MD on 11/12/2021 Stage prefix: Recurrence   03/11/2021 Imaging   1. Status post interval hysterectomy and oophorectomy. 2. There is a rim enhancing soft tissue mass centered in the vagina, measuring approximately 4.4 x 3.1 cm. This is new, or significantly enlarged compared to prior examination dated 01/05/2021. Findings are concerning for malignant involvement. Correlate with physical examination. Contrast enhanced MRI may be helpful to further detail soft tissue anatomy of the low pelvis if indicated. 3. There is at least one newly enlarged left iliac lymph node measuring up to 1.4 x 1.3 cm, nonspecific although concerning for nodal metastatic disease. 4. Fluid attenuation lymphocele or seroma adjacent to surgical clip at the level right ovarian vein. 5.  Status post interval midline laparotomy. Subcutaneous fluid within the inferior aspect of the wound. The presence or absence of infection is not established by CT.     03/23/2021 - 07/09/2021 Chemotherapy   Patient is on Treatment Plan : UTERINE Carboplatin AUC 6 / Paclitaxel q21d     03/23/2021 Procedure   Placement of a subcutaneous power-injectable port device. Catheter tip at the superior cavoatrial junction   07/30/2021 Imaging   1. Large ill-defined partially enhancing and likely centrally necrotic vaginal and paravaginal mass. Recommend correlation with direct  visualization. MRI pelvis without and with contrast may be helpful for further evaluation. 2. No findings suspicious for omental or peritoneal surface disease. No adenopathy.   08/09/2021 - 09/08/2021 Radiation Therapy   Indication for treatment:  Curative       Radiation treatment dates:   08/09/21 through 09/08/21 Site/dose:  IMRT Uterus; Vagina : 41.5 Gy delivered in 23 Fx with 1.80 Gy/Fx (Planned dosage was for 45.00 GY delivered in 25 Fx with 1.80 Gy/Fx, her final 2 treatments (and boost) was canceled secondary to significant radiation reaction)   Beams/energy: 6X   11/11/2021 Imaging   1. 11 mm left upper lobe pulmonary nodule in the apicoposterior segment abuts the posterior pleural margin. In this clinical context the appearance is very concerning for a metastatic lesion although we do not have prior cross-sectional imaging to assess stability. Proximity to the fifth rib and adjacent major fissure might make percutaneous biopsy tricky; PET-CT would also be one way of assessing the metabolic activity/malignant potential of this lesion. This may be a good candidate case for discussion at multidisciplinary cancer conference. 2. Increased adenopathy along the left iliac chain, including an internal iliac node which abuts the left sacral plexus. 3. The lower vaginal and vestibular mass measures about 7.0 by 5.5 by 5.9 cm and has some internal gas which may indicate ulceration along the vestibule/perineum. Poor definition of tissue planes between this process in the base of the urinary bladder as well as the anus. The mass extends along the left anterior obturator internus muscle. 4. Other imaging findings of potential clinical significance: Prominent main pulmonary artery, cannot exclude pulmonary arterial hypertension. Aortic Atherosclerosis (ICD10-I70.0). Lower lumbar spondylosis and degenerative disc disease.   11/19/2021 - 03/11/2022 Chemotherapy   Patient is on Treatment Plan : UTERINE  Pembrolizumab (200) q21d     11/19/2021 -  Chemotherapy   Patient is on Treatment Plan : UTERINE Pembrolizumab (200) q21d     01/21/2022 Imaging   1. Status post hysterectomy. Generally diminished size of an ulcerated mass involving the vagina when compared to prior examination, difficult to accurately measure due to poor resolution of adjacent soft tissues. 2. Significant interval decrease in size of left pelvic sidewall and iliac lymphadenopathy. 3. Interval enlargement of a pulmonary nodule of the posterior left upper lobe. 4. 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 5. New, diffuse urinary bladder wall thickening and adjacent fat stranding, consistent with nonspecific infectious or inflammatory cystitis, most likely related to local radiation therapy.   04/22/2022 Imaging   1. Resolution of previously noted soft tissue mass associated with the apex of the vagina. 2. Regression of previously noted left upper lobe pulmonary nodule. However, there are multiple new satellite nodules in the adjacent lung parenchyma in the upper left lung, largest of which measures 1.2 x 0.6 cm in  the superior segment of the left lower lobe. Close attention on follow-up studies is recommended. 3. Interval development of mildly enlarged bilateral inguinal lymph nodes. These could be reactive, however, the possibility of locoregional nodal metastases is not excluded, and close attention on follow-up imaging is recommended to ensure regression of these findings. 4. Aortic atherosclerosis, in addition to left anterior descending coronary artery disease. Assessment for potential risk factor modification, dietary therapy or pharmacologic therapy may be warranted, if clinically indicated.  5. Additional incidental findings, as above.     PHYSICAL  EXAMINATION: ECOG PERFORMANCE STATUS: 0 - Asymptomatic  Vitals:   08/12/22 0827  BP: 131/74  Pulse: 64  Resp: 18  Temp: (!) 97.4 F (36.3 C)  SpO2: 100%   Filed Weights   08/12/22 0827  Weight: 203 lb 6.4 oz (92.3 kg)    GENERAL:alert, no distress and comfortable SKIN: skin color, texture, turgor are normal, no rashes or significant lesions EYES: normal, Conjunctiva are pink and non-injected, sclera clear OROPHARYNX:no exudate, no erythema and lips, buccal mucosa, and tongue normal  NECK: supple, thyroid normal size, non-tender, without nodularity LYMPH:  no palpable lymphadenopathy in the cervical, axillary or inguinal LUNGS: clear to auscultation and percussion with normal breathing effort HEART: regular rate & rhythm and no murmurs and no lower extremity edema ABDOMEN:abdomen soft, non-tender and normal bowel sounds Musculoskeletal:no cyanosis of digits and no clubbing  NEURO: alert & oriented x 3 with fluent speech, no focal motor/sensory deficits  LABORATORY DATA:  I have reviewed the data as listed    Component Value Date/Time   NA 141 08/12/2022 0803   K 4.0 08/12/2022 0803   CL 110 08/12/2022 0803   CO2 27 08/12/2022 0803   GLUCOSE 72 08/12/2022 0803   BUN 19 08/12/2022 0803   CREATININE 0.74 08/12/2022 0803   CALCIUM 9.2 08/12/2022 0803   PROT 6.8 08/12/2022 0803   ALBUMIN 3.7 08/12/2022 0803   AST 12 (L) 08/12/2022 0803   ALT 12 08/12/2022 0803   ALKPHOS 93 08/12/2022 0803   BILITOT 0.3 08/12/2022 0803   GFRNONAA >60 08/12/2022 0803    No results found for: "SPEP", "UPEP"  Lab Results  Component Value Date   WBC 3.1 (L) 08/12/2022   NEUTROABS 1.5 (L) 08/12/2022   HGB 10.5 (L) 08/12/2022   HCT 33.1 (L) 08/12/2022   MCV 73.7 (L) 08/12/2022   PLT 219 08/12/2022      Chemistry      Component Value Date/Time   NA 141 08/12/2022 0803   K 4.0 08/12/2022 0803   CL 110 08/12/2022 0803   CO2 27 08/12/2022 0803   BUN 19 08/12/2022 0803   CREATININE  0.74 08/12/2022 0803      Component Value Date/Time   CALCIUM 9.2 08/12/2022 0803   ALKPHOS 93 08/12/2022 0803   AST 12 (L) 08/12/2022 0803   ALT 12 08/12/2022 0803   BILITOT 0.3 08/12/2022 0803

## 2022-08-12 NOTE — Telephone Encounter (Signed)
Attempted to call with below message. Unable to leave message d/t voicemail being full.

## 2022-08-12 NOTE — Assessment & Plan Note (Signed)
She has gained some weight TSH is pending I will call her with test results and adjust the dose of Synthroid as needed 

## 2022-08-12 NOTE — Telephone Encounter (Signed)
-----   Message from [name], MD sent at 08/12/2022  2:19 PM EST ----- Regarding: TSH Her TSH is now normal I refilled her synthroid to her [facility] [place]

## 2022-08-12 NOTE — Telephone Encounter (Signed)
Attempted to call Pt again regarding TSH results and synthroid rx refill to [facility] in [place]. Unable to relay message as voicemail is full. No call backs received from Pt's emergency contacts.

## 2022-08-14 ENCOUNTER — Other Ambulatory Visit: Payer: Self-pay

## 2022-08-14 LAB — T4: T4, Total: 8.8 ug/dL (ref 4.5–12.0)

## 2022-08-15 ENCOUNTER — Encounter: Payer: Self-pay | Admitting: Hematology and Oncology

## 2022-08-15 ENCOUNTER — Telehealth: Payer: Self-pay

## 2022-08-15 NOTE — Progress Notes (Signed)
Letter mailed to Pt regarding rx refill and our failed attempts to contact Pt d/t full voicemail.

## 2022-08-15 NOTE — Telephone Encounter (Signed)
Pt called regarding missed calls on 2/2. Discussed with Pt that TSH is normal and synthroid rx is available for pickup. Pt verbalized understanding. Pt states she cleared out her voicemail and will call today to schedule CT prior to next tx. No further questions at this time.

## 2022-08-26 ENCOUNTER — Other Ambulatory Visit: Payer: Self-pay

## 2022-08-29 ENCOUNTER — Other Ambulatory Visit: Payer: Self-pay | Admitting: Hematology and Oncology

## 2022-08-29 ENCOUNTER — Encounter (HOSPITAL_COMMUNITY): Payer: Self-pay

## 2022-08-29 ENCOUNTER — Ambulatory Visit (HOSPITAL_COMMUNITY)
Admission: RE | Admit: 2022-08-29 | Discharge: 2022-08-29 | Disposition: A | Discharge status: Home / Self Care | Payer: Medicare PPO | Source: Ambulatory Visit | Attending: Hematology and Oncology | Admitting: Hematology and Oncology

## 2022-08-29 DIAGNOSIS — R918 Other nonspecific abnormal finding of lung field: Secondary | ICD-10-CM | POA: Diagnosis not present

## 2022-08-29 DIAGNOSIS — C55 Malignant neoplasm of uterus, part unspecified: Secondary | ICD-10-CM

## 2022-08-29 DIAGNOSIS — I700 Atherosclerosis of aorta: Secondary | ICD-10-CM | POA: Diagnosis not present

## 2022-08-29 DIAGNOSIS — Z8541 Personal history of malignant neoplasm of cervix uteri: Secondary | ICD-10-CM | POA: Diagnosis not present

## 2022-08-29 DIAGNOSIS — R590 Localized enlarged lymph nodes: Secondary | ICD-10-CM | POA: Diagnosis not present

## 2022-08-29 DIAGNOSIS — N3289 Other specified disorders of bladder: Secondary | ICD-10-CM | POA: Diagnosis not present

## 2022-08-29 DIAGNOSIS — K769 Liver disease, unspecified: Secondary | ICD-10-CM | POA: Diagnosis not present

## 2022-08-29 DIAGNOSIS — K7689 Other specified diseases of liver: Secondary | ICD-10-CM | POA: Diagnosis not present

## 2022-08-29 MED ORDER — HEPARIN SOD (PORK) LOCK FLUSH 100 UNIT/ML IV SOLN
INTRAVENOUS | Status: AC
  Filled 2022-08-29: qty 5

## 2022-08-29 MED ORDER — SODIUM CHLORIDE (PF) 0.9 % IJ SOLN
INTRAMUSCULAR | Status: AC
  Filled 2022-08-29: qty 50

## 2022-08-29 MED ORDER — IOHEXOL 300 MG/ML  SOLN
100.0000 mL | Freq: Once | INTRAMUSCULAR | Status: AC | PRN
  Administered 2022-08-29: 100 mL via INTRAVENOUS

## 2022-09-02 ENCOUNTER — Encounter: Payer: Self-pay | Admitting: Hematology and Oncology

## 2022-09-02 ENCOUNTER — Inpatient Hospital Stay: Payer: Medicare PPO

## 2022-09-02 ENCOUNTER — Inpatient Hospital Stay (HOSPITAL_BASED_OUTPATIENT_CLINIC_OR_DEPARTMENT_OTHER): Payer: Medicare PPO | Admitting: Hematology and Oncology

## 2022-09-02 ENCOUNTER — Other Ambulatory Visit: Payer: Self-pay

## 2022-09-02 VITALS — BP 115/59 | HR 66 | Temp 98.0°F | Resp 18 | Ht 63.0 in | Wt 203.8 lb

## 2022-09-02 DIAGNOSIS — Z79899 Other long term (current) drug therapy: Secondary | ICD-10-CM | POA: Diagnosis not present

## 2022-09-02 DIAGNOSIS — E039 Hypothyroidism, unspecified: Secondary | ICD-10-CM

## 2022-09-02 DIAGNOSIS — C55 Malignant neoplasm of uterus, part unspecified: Secondary | ICD-10-CM | POA: Diagnosis not present

## 2022-09-02 DIAGNOSIS — N390 Urinary tract infection, site not specified: Secondary | ICD-10-CM | POA: Diagnosis not present

## 2022-09-02 DIAGNOSIS — C7800 Secondary malignant neoplasm of unspecified lung: Secondary | ICD-10-CM | POA: Diagnosis not present

## 2022-09-02 DIAGNOSIS — D539 Nutritional anemia, unspecified: Secondary | ICD-10-CM

## 2022-09-02 DIAGNOSIS — Z5112 Encounter for antineoplastic immunotherapy: Secondary | ICD-10-CM | POA: Diagnosis not present

## 2022-09-02 LAB — CBC WITH DIFFERENTIAL (CANCER CENTER ONLY)
Abs Immature Granulocytes: 0.02 10*3/uL (ref 0.00–0.07)
Basophils Absolute: 0 10*3/uL (ref 0.0–0.1)
Basophils Relative: 1 %
Eosinophils Absolute: 0.1 10*3/uL (ref 0.0–0.5)
Eosinophils Relative: 3 %
HCT: 32.7 % — ABNORMAL LOW (ref 36.0–46.0)
Hemoglobin: 10.4 g/dL — ABNORMAL LOW (ref 12.0–15.0)
Immature Granulocytes: 0 %
Lymphocytes Relative: 25 %
Lymphs Abs: 1.2 10*3/uL (ref 0.7–4.0)
MCH: 23.6 pg — ABNORMAL LOW (ref 26.0–34.0)
MCHC: 31.8 g/dL (ref 30.0–36.0)
MCV: 74.1 fL — ABNORMAL LOW (ref 80.0–100.0)
Monocytes Absolute: 0.3 10*3/uL (ref 0.1–1.0)
Monocytes Relative: 7 %
Neutro Abs: 2.9 10*3/uL (ref 1.7–7.7)
Neutrophils Relative %: 64 %
Platelet Count: 387 10*3/uL (ref 150–400)
RBC: 4.41 MIL/uL (ref 3.87–5.11)
RDW: 16.4 % — ABNORMAL HIGH (ref 11.5–15.5)
WBC Count: 4.6 10*3/uL (ref 4.0–10.5)
nRBC: 0 % (ref 0.0–0.2)

## 2022-09-02 LAB — URINALYSIS, COMPLETE (UACMP) WITH MICROSCOPIC
Bilirubin Urine: NEGATIVE
Glucose, UA: NEGATIVE mg/dL
Hgb urine dipstick: NEGATIVE
Ketones, ur: NEGATIVE mg/dL
Nitrite: NEGATIVE
Protein, ur: 100 mg/dL — AB
Specific Gravity, Urine: 1.018 (ref 1.005–1.030)
WBC, UA: >50 WBC/hpf (ref 0–5)
pH: 5 (ref 5.0–8.0)

## 2022-09-02 LAB — TSH: TSH: 26.896 u[IU]/mL — ABNORMAL HIGH (ref 0.350–4.500)

## 2022-09-02 LAB — CMP (CANCER CENTER ONLY)
ALT: 26 U/L (ref 0–44)
AST: 12 U/L — ABNORMAL LOW (ref 15–41)
Albumin: 3.7 g/dL (ref 3.5–5.0)
Alkaline Phosphatase: 141 U/L — ABNORMAL HIGH (ref 38–126)
Anion gap: 6 (ref 5–15)
BUN: 18 mg/dL (ref 8–23)
CO2: 26 mmol/L (ref 22–32)
Calcium: 8.5 mg/dL — ABNORMAL LOW (ref 8.9–10.3)
Chloride: 107 mmol/L (ref 98–111)
Creatinine: 0.77 mg/dL (ref 0.44–1.00)
GFR, Estimated: >60 mL/min (ref >60)
Glucose, Bld: 109 mg/dL — ABNORMAL HIGH (ref 70–99)
Potassium: 4.2 mmol/L (ref 3.5–5.1)
Sodium: 139 mmol/L (ref 135–145)
Total Bilirubin: 0.2 mg/dL — ABNORMAL LOW (ref 0.3–1.2)
Total Protein: 7.5 g/dL (ref 6.5–8.1)

## 2022-09-02 MED ORDER — HEPARIN SOD (PORK) LOCK FLUSH 100 UNIT/ML IV SOLN
500.0000 [IU] | Freq: Once | INTRAVENOUS | Status: AC | PRN
  Administered 2022-09-02: 500 [IU]

## 2022-09-02 MED ORDER — CYANOCOBALAMIN 1000 MCG/ML IJ SOLN
1000.0000 ug | Freq: Once | INTRAMUSCULAR | Status: AC
  Administered 2022-09-02: 1000 ug via INTRAMUSCULAR
  Filled 2022-09-02: qty 1

## 2022-09-02 MED ORDER — SODIUM CHLORIDE 0.9 % IV SOLN: Freq: Once | INTRAVENOUS | Status: AC

## 2022-09-02 MED ORDER — SODIUM CHLORIDE 0.9% FLUSH
10.0000 mL | INTRAVENOUS | Status: DC | PRN
  Administered 2022-09-02: 10 mL

## 2022-09-02 MED ORDER — SODIUM CHLORIDE 0.9 % IV SOLN
200.0000 mg | Freq: Once | INTRAVENOUS | Status: AC
  Administered 2022-09-02: 200 mg via INTRAVENOUS
  Filled 2022-09-02: qty 8

## 2022-09-02 MED ORDER — SODIUM CHLORIDE 0.9% FLUSH
10.0000 mL | Freq: Once | INTRAVENOUS | Status: AC
  Administered 2022-09-02: 10 mL

## 2022-09-02 NOTE — Assessment & Plan Note (Signed)
CT imaging study showed possible abnormalities on her bladder I have ordered urinalysis and urine culture We will call her with test results next week

## 2022-09-02 NOTE — Assessment & Plan Note (Signed)
She has gained some weight TSH is pending I will call her with test results and adjust the dose of Synthroid as needed

## 2022-09-02 NOTE — Assessment & Plan Note (Signed)
Recent blood work suggested B12 deficiency She will get B12 injection along with chemotherapy with every cycle

## 2022-09-02 NOTE — Patient Instructions (Signed)
[facility]  Discharge Instructions: Thank you for choosing [facility] to provide your oncology and hematology care.   If you have a lab appointment with the Cancer Center, please go directly to the Cancer Center and check in at the registration area.   Wear comfortable clothing and clothing appropriate for easy access to any Portacath or PICC line.   We strive to give you quality time with your provider. You may need to reschedule your appointment if you arrive late (15 or more minutes).  Arriving late affects you and other patients whose appointments are after yours.  Also, if you miss three or more appointments without notifying the office, you may be dismissed from the clinic at the provider's discretion.      For prescription refill requests, have your pharmacy contact our office and allow 72 hours for refills to be completed.    Today you received the following chemotherapy and/or immunotherapy agents Keytruda      To help prevent nausea and vomiting after your treatment, we encourage you to take your nausea medication as directed.  BELOW ARE SYMPTOMS THAT SHOULD BE REPORTED IMMEDIATELY: *FEVER GREATER THAN 100.4 F (38 C) OR HIGHER *CHILLS OR SWEATING *NAUSEA AND VOMITING THAT IS NOT CONTROLLED WITH YOUR NAUSEA MEDICATION *UNUSUAL SHORTNESS OF BREATH *UNUSUAL BRUISING OR BLEEDING *URINARY PROBLEMS (pain or burning when urinating, or frequent urination) *BOWEL PROBLEMS (unusual diarrhea, constipation, pain near the anus) TENDERNESS IN MOUTH AND THROAT WITH OR WITHOUT PRESENCE OF ULCERS (sore throat, sores in mouth, or a toothache) UNUSUAL RASH, SWELLING OR PAIN  UNUSUAL VAGINAL DISCHARGE OR ITCHING   Items with * indicate a potential emergency and should be followed up as soon as possible or go to the Emergency Department if any problems should occur.  Please show the CHEMOTHERAPY ALERT CARD or IMMUNOTHERAPY ALERT CARD at  check-in to the Emergency Department and triage nurse.  Should you have questions after your visit or need to cancel or reschedule your appointment, please contact [facility]  Dept: [phone]  and follow the prompts.  Office hours are 8:00 a.m. to 4:30 p.m. Monday - Friday. Please note that voicemails left after 4:00 p.m. may not be returned until the following business day.  We are closed weekends and major holidays. You have access to a nurse at all times for urgent questions. Please call the main number to the clinic Dept: [phone] and follow the prompts.   For any non-urgent questions, you may also contact your provider using MyChart. We now offer e-Visits for anyone 18 and older to request care online for non-urgent symptoms. For details visit [email or web address].   Also download the MyChart app! Go to the app store, search "MyChart", open the app, select , and log in with your MyChart username and password.   

## 2022-09-02 NOTE — Assessment & Plan Note (Signed)
Overall, she is not symptomatic She has no pelvic pain She will proceed with treatment today I reviewed CT imaging with the patient which show excellent response to therapy I am not concerned about the abnormal changes in her lungs Her next imaging study will be due in June

## 2022-09-02 NOTE — Progress Notes (Signed)
[place] OFFICE PROGRESS NOTE  Patient Care Team: Pcp, No as PCP - General  ASSESSMENT & PLAN:  Uterine cancer ([place]) Overall, she is not symptomatic She has no pelvic pain She will proceed with treatment today I reviewed CT imaging with the patient which show excellent response to therapy I am not concerned about the abnormal changes in her lungs Her next imaging study will be due in June  Hypothyroidism She has gained some weight TSH is pending I will call her with test results and adjust the dose of Synthroid as needed  Deficiency anemia Recent blood work suggested B12 deficiency She will get B12 injection along with chemotherapy with every cycle  UTI (urinary tract infection) CT imaging study showed possible abnormalities on her bladder I have ordered urinalysis and urine culture We will call her with test results next week  Orders Placed This Encounter  Procedures   CBC with Differential ([place] Only)    Standing Status:   Future    Standing Expiration Date:   09/24/2023   CMP ([place] only)    Standing Status:   Future    Standing Expiration Date:   09/24/2023   T4    Standing Status:   Future    Standing Expiration Date:   09/24/2023   TSH    Standing Status:   Future    Standing Expiration Date:   09/24/2023    All questions were answered. The patient knows to call the clinic with any problems, questions or concerns. The total time spent in the appointment was 30 minutes encounter with patients including review of chart and various tests results, discussions about plan of care and coordination of care plan   [name], MD 09/02/2022 10:17 AM  INTERVAL HISTORY: Please see below for problem oriented charting. she returns for treatment follow-up on maintenance pembrolizumab She complains of cloudy urine She tolerated treatment well otherwise No recent bleeding She continues to have constipation requiring laxatives  REVIEW OF  SYSTEMS:   Constitutional: Denies fevers, chills or abnormal weight loss Eyes: Denies blurriness of vision Ears, nose, mouth, throat, and face: Denies mucositis or sore throat Respiratory: Denies cough, dyspnea or wheezes Cardiovascular: Denies palpitation, chest discomfort or lower extremity swelling Gastrointestinal:  Denies nausea, heartburn or change in bowel habits Skin: Denies abnormal skin rashes Lymphatics: Denies new lymphadenopathy or easy bruising Neurological:Denies numbness, tingling or new weaknesses Behavioral/Psych: Mood is stable, no new changes  All other systems were reviewed with the patient and are negative.  I have reviewed the past medical history, past surgical history, social history and family history with the patient and they are unchanged from previous note.  ALLERGIES:  has No Known Allergies.  MEDICATIONS:  Current Outpatient Medications  Medication Sig Dispense Refill   acetaminophen (TYLENOL) 325 MG tablet Take 650 mg by mouth at bedtime as needed (pain).     levothyroxine (SYNTHROID) 125 MCG tablet Take 1 tablet (125 mcg total) by mouth daily before breakfast. 30 tablet 3   magnesium hydroxide (MILK OF MAGNESIA) 400 MG/5ML suspension Take 30 mLs by mouth daily.     polyethylene glycol (MIRALAX / GLYCOLAX) 17 g packet Take 17 g by mouth daily. (Patient taking differently: Take 17 g by mouth daily as needed for mild constipation.) 30 each 1   No current facility-administered medications for this visit.   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   Uterine cancer ([place])  01/05/2021 Imaging   Outside CT imaging  Bilateral hydronephrosis related extrinsic compression on the ureters by the enlarged uterus.   Multiple calcified uterine fibroids although peripherally enhancing diffuse decreased attenuation is noted centrally within the uterus highly suspicious for endometrial neoplasm given the current history of vaginal bleeding. Further evaluation is recommended. MRI may be helpful. Additionally tissue sampling would likely be helpful.   Hypodensity in the pancreas at the junction of the head and body of the pancreas. This may be related to volume averaging although the possibility of a cystic lesion could not be totally excluded. MRI is recommended for further evaluation.    01/05/2021 Initial Diagnosis   The patient reported at least 1 year of postmenopausal bleeding.  She does not have a primary care provider or gynecologist.  She had known fibroids and had noted that she needed a hysterectomy but had not gotten around to seeking care for this.  She presented to the [facility] emergency department on January 05, 2021 with new onset right lower quadrant pain and this prompted a CT scan of the abdomen and pelvis to be performed on 01/05/2021.     01/14/2021 Pathology Results   FINAL MICROSCOPIC DIAGNOSIS:   A. CERVIX, ENDOCERVIX, BIOPSY:  -  Endometrioid carcinoma, FIGO grade 1  -  See comment    01/26/2021 Imaging   MRI abdomen  1. No suspicious pancreatic mass identified. 2. Moderate to severe bilateral hydroureteronephrosis, similar to recent CT the abdomen and pelvis, presumably related to extrinsic ureteral compression from the patient's enlarged uterus, as demonstrated on recent CT the abdomen and pelvis 01/05/2021.    02/01/2021 Surgery   Surgery in [facility]  Date of Service: February 01, 2021 3:13 PM  Preoperative Diagnoses: Endometrial cancer  Postoperative Diagnoses: Endometrial cancer  Procedures: Total abdominal hysterectomy, bilateral salpingo-oophorectomy, left pelvic lymphadenectomy, right para-aortic lymphadenectomy  Surgeon: Everitt Amber, MD  Findings: Tumor appreciated in the cervical canal. Large, broad, multi fibroid uterus with a calcified fibroid filling the posterior cul de sac, an anterior lower uterine segment fibroid, and bilateral fibroids near the level of the cardinal ligament. No evidence of intraperitoneal disease. Normal appearing bilateral tubes and ovaries. Bilateral hydroureter from uterine compression. Tumor excreted from specimen into the pelvis at time of removal. An approximately 2cm enlarged left pelvic lymph node and a 1cm enlarged right aorta caval lymph node were identified.     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) - Right ovary, negative for tumor - Bilateral fallopian tubes, negative for tumor - Multiple leiomyomata (up to 8.5 cm)   B: Lymph node, aortocaval, excision - Metastatic carcinoma involving  1 lymph node (0.4 cm) (1/1)   C: Lymph node, left pelvic, excision -Metastatic carcinoma involving 1 lymph node (0.3 cm) (1/1)   Diagnosis Comment    The majority of this tumor is composed of well differentiated (overall grade 2, but with areas consistent with grade 1) endometrioid adenocarcinoma with squamous and mucinous differentiation (approximately 70%); multifocally there is abrupt transition to an undifferentiated  malignancy with a high-grade monotonous noncohesive appearance (best seen in slide A2) consistent with undifferentiated carcinoma.  The overall morphologic and immunophenotypic findings are consistent with a dedifferentiated endometrial carcinoma.   Of note, the carcinoma present in the lymph nodes is well differentiated endometrioid carcinoma and the metastatic deposit in the ovary is undifferentiated carcinoma. Carcinoma is present less than 1 mm from uterine corpus serosa (in the lower uterine segment).  The assessment of the paracervical soft tissue margins is challenging due to the disrupted nature of the specimen in this area, however tumor appears present less than 1 mm from paracervical soft tissue.  No definite parametrial invasion is identified.   Immunohistochemistry performed at [facility] (with appropriate controls) demonstrates the following immunoprofile in cells of interest: Well differentiated endometrioid carcinoma: CK AE1/AE3: Positive ER: Positive (>90%, strong staining) P53: Favor wild-type (see comment below)   Dedifferentiated carcinoma: CK AE1/AE3: Very rare cells, predominantly negative ER: Negative P53: Small focus of mutant type staining in a background of wild-type staining (see comment below)   P53 staining comment: In the well differentiated carcinoma component the staining pattern is predominantly wild-type although focally there is slightly increased levels of staining noted.  In the dedifferentiated tumor component, most of the tissue exhibits wild-type pattern staining, however there is a distinct small area that exhibits increased/overexpressed mutant pattern p53 staining.    Immunohistochemistry testing for mismatch repair (MMR) proteins is performed on block A2:   MLH1:  Loss of nuclear expression MSH2:  Intact nuclear expression MSH6:  Intact nuclear expression PMS2:  Loss of nuclear expression   Loss of nuclear expression of MLH1 and PMS2 can be a result of  sporadic microsatellite instability (via hypermethylation of the MLH1 promoter region) or due to Lynch syndrome / [place]. Materials will be submitted to the [facility] [phone]) for [facility] promoter hypermethylation testing. These results will be issued in a separate report.      03/05/2021 Initial Diagnosis   Uterine cancer ([place])   03/05/2021 Cancer Staging   Staging form: Corpus Uteri - Carcinoma and Carcinosarcoma, AJCC 8th Edition - Pathologic stage from 03/05/2021: FIGO Stage IVB (rpT3, pN2a, cM1) - Signed by [name], MD on 11/12/2021 Stage prefix: Recurrence   03/11/2021 Imaging   1. Status post interval hysterectomy and oophorectomy. 2. There is a rim enhancing soft tissue mass centered in the vagina, measuring approximately 4.4 x 3.1 cm. This is new, or significantly enlarged compared to prior examination dated 01/05/2021. Findings are concerning for malignant involvement. Correlate with physical examination. Contrast enhanced MRI may be helpful to further detail soft tissue anatomy of the low pelvis if indicated. 3. There is at least one newly enlarged left iliac lymph node measuring up to 1.4 x 1.3 cm, nonspecific although concerning for nodal metastatic disease. 4. Fluid attenuation lymphocele or seroma adjacent to surgical clip at the level right ovarian vein. 5. Status post interval midline laparotomy. Subcutaneous fluid within the inferior aspect of the wound. The presence or absence of infection is not established by CT.     03/23/2021 - 07/09/2021 Chemotherapy   Patient  is on Treatment Plan : UTERINE Carboplatin AUC 6 / Paclitaxel q21d     03/23/2021 Procedure   Placement of a subcutaneous power-injectable port device. Catheter tip at the superior cavoatrial junction   07/30/2021 Imaging   1. Large ill-defined partially enhancing and likely centrally necrotic vaginal and paravaginal mass. Recommend correlation with direct visualization. MRI pelvis without and  with contrast may be helpful for further evaluation. 2. No findings suspicious for omental or peritoneal surface disease. No adenopathy.   08/09/2021 - 09/08/2021 Radiation Therapy   Indication for treatment:  Curative       Radiation treatment dates:   08/09/21 through 09/08/21 Site/dose:  IMRT Uterus; Vagina : 41.5 Gy delivered in 23 Fx with 1.80 Gy/Fx (Planned dosage was for 45.00 GY delivered in 25 Fx with 1.80 Gy/Fx, her final 2 treatments (and boost) was canceled secondary to significant radiation reaction)   Beams/energy: 6X   11/11/2021 Imaging   1. 11 mm left upper lobe pulmonary nodule in the apicoposterior segment abuts the posterior pleural margin. In this clinical context the appearance is very concerning for a metastatic lesion although we do not have prior cross-sectional imaging to assess stability. Proximity to the fifth rib and adjacent major fissure might make percutaneous biopsy tricky; PET-CT would also be one way of assessing the metabolic activity/malignant potential of this lesion. This may be a good candidate case for discussion at multidisciplinary cancer conference. 2. Increased adenopathy along the left iliac chain, including an internal iliac node which abuts the left sacral plexus. 3. The lower vaginal and vestibular mass measures about 7.0 by 5.5 by 5.9 cm and has some internal gas which may indicate ulceration along the vestibule/perineum. Poor definition of tissue planes between this process in the base of the urinary bladder as well as the anus. The mass extends along the left anterior obturator internus muscle. 4. Other imaging findings of potential clinical significance: Prominent main pulmonary artery, cannot exclude pulmonary arterial hypertension. Aortic Atherosclerosis (ICD10-I70.0). Lower lumbar spondylosis and degenerative disc disease.   11/19/2021 - 03/11/2022 Chemotherapy   Patient is on Treatment Plan : UTERINE Pembrolizumab (200) q21d     11/19/2021 -   Chemotherapy   Patient is on Treatment Plan : UTERINE Pembrolizumab (200) q21d     01/21/2022 Imaging   1. Status post hysterectomy. Generally diminished size of an ulcerated mass involving the vagina when compared to prior examination, difficult to accurately measure due to poor resolution of adjacent soft tissues. 2. Significant interval decrease in size of left pelvic sidewall and iliac lymphadenopathy. 3. Interval enlargement of a pulmonary nodule of the posterior left upper lobe. 4. 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 5. New, diffuse urinary bladder wall thickening and adjacent fat stranding, consistent with nonspecific infectious or inflammatory cystitis, most likely related to local radiation therapy.   04/22/2022 Imaging   1. Resolution of previously noted soft tissue mass associated with the apex of the vagina. 2. Regression of previously noted left upper lobe pulmonary nodule. However, there are multiple new satellite nodules in the adjacent lung parenchyma in the upper left lung, largest of which measures 1.2 x 0.6 cm in the superior segment of the left lower lobe. Close attention on follow-up studies is recommended. 3. Interval development of mildly enlarged bilateral inguinal lymph nodes. These could be reactive, however, the possibility of locoregional nodal metastases  is not excluded, and close attention on follow-up imaging is recommended to ensure regression of these findings. 4. Aortic atherosclerosis, in addition to left anterior descending coronary artery disease. Assessment for potential risk factor modification, dietary therapy or pharmacologic therapy may be warranted, if clinically indicated.  5. Additional incidental findings, as above.   08/29/2022 Imaging   1. Previously noted clustered nodularity in the  upper left lung has resolved compared to the prior study. However, today's study demonstrates a new mass-like area of architectural distortion with some internal air bronchograms and cavitation in the posterior aspect of the left upper lobe in the same region. Although this is mass-like in appearance on axial images, this is rather linear in appearance on coronal and sagittal reconstructions. If there has been radiation therapy to this region, this could simply represent an area of evolving postradiation mass-like fibrosis. In the absence of a history of radiation therapy, these findings are presumably of infectious etiology. Neoplasm is not favored, but difficult to entirely exclude. Close attention on follow-up studies is recommended to evaluate for the evolution of these findings. No other suspicious appearing pulmonary nodules or masses are noted on today's examination. 2. No definite signs of metastatic disease noted in the abdomen or pelvis. 3. Multiple prominent borderline enlarged and minimally enlarged bilateral inguinal lymph nodes, slightly less prominent than the prior examination. Continued attention on follow-up studies is recommended. 4. Severe circumferential thickening of the urinary bladder wall without discrete bladder wall mass. This is of uncertain etiology and significance, but correlation with urinalysis is recommended to exclude the possibility of cystitis. Attention on follow-up imaging is recommended to ensure resolution. 5. Aortic atherosclerosis, in addition to left anterior descending coronary artery disease. Please note that although the presence of coronary artery calcium documents the presence of coronary artery disease, the severity of this disease and any potential stenosis cannot be assessed on this non-gated CT examination. Assessment for potential risk factor modification, dietary therapy or pharmacologic therapy may be warranted, if clinically indicated. 6. Additional  incidental findings, as above.       PHYSICAL EXAMINATION: ECOG PERFORMANCE STATUS: 1 - Symptomatic but completely ambulatory  Vitals:   09/02/22 0905  BP: (!) 115/59  Pulse: 66  Resp: 18  Temp: 98 F (36.7 C)  SpO2: 100%   Filed Weights   09/02/22 0905  Weight: 203 lb 12.8 oz (92.4 kg)    GENERAL:alert, no distress and comfortable   NEURO: alert & oriented x 3 with fluent speech, no focal motor/sensory deficits  LABORATORY DATA:  I have reviewed the data as listed    Component Value Date/Time   NA 139 09/02/2022 0834   K 4.2 09/02/2022 0834   CL 107 09/02/2022 0834   CO2 26 09/02/2022 0834   GLUCOSE 109 (H) 09/02/2022 0834   BUN 18 09/02/2022 0834   CREATININE 0.77 09/02/2022 0834   CALCIUM 8.5 (L) 09/02/2022 0834   PROT 7.5 09/02/2022 0834   ALBUMIN 3.7 09/02/2022 0834   AST 12 (L) 09/02/2022 0834   ALT 26 09/02/2022 0834   ALKPHOS 141 (H) 09/02/2022 0834   BILITOT 0.2 (L) 09/02/2022 0834   GFRNONAA >60 09/02/2022 0834    No results found for: "SPEP", "UPEP"  Lab Results  Component Value Date   WBC 4.6 09/02/2022   NEUTROABS 2.9 09/02/2022   HGB 10.4 (L) 09/02/2022   HCT 32.7 (L) 09/02/2022   MCV 74.1 (L) 09/02/2022   PLT 387 09/02/2022      Chemistry  Component Value Date/Time   NA 139 09/02/2022 0834   K 4.2 09/02/2022 0834   CL 107 09/02/2022 0834   CO2 26 09/02/2022 0834   BUN 18 09/02/2022 0834   CREATININE 0.77 09/02/2022 0834      Component Value Date/Time   CALCIUM 8.5 (L) 09/02/2022 0834   ALKPHOS 141 (H) 09/02/2022 0834   AST 12 (L) 09/02/2022 0834   ALT 26 09/02/2022 0834   BILITOT 0.2 (L) 09/02/2022 0834       RADIOGRAPHIC STUDIES: I have reviewed CT imaging I have personally reviewed the radiological images as listed and agreed with the findings in the report. CT CHEST ABDOMEN PELVIS W CONTRAST  Result Date: 08/29/2022 CLINICAL DATA:  72 year old female with history of cervical cancer status post chemotherapy and  radiation therapy now complete. Follow-up study. * Tracking Code: BO * EXAM: CT CHEST, ABDOMEN, AND PELVIS WITH CONTRAST TECHNIQUE: Multidetector CT imaging of the chest, abdomen and pelvis was performed following the standard protocol during bolus administration of intravenous contrast. RADIATION DOSE REDUCTION: This exam was performed according to the departmental dose-optimization program which includes automated exposure control, adjustment of the mA and/or kV according to patient size and/or use of iterative reconstruction technique. CONTRAST:  140m OMNIPAQUE IOHEXOL 300 MG/ML  SOLN COMPARISON:  CT of the chest, abdomen and pelvis 04/20/2022. FINDINGS: CT CHEST FINDINGS Cardiovascular: Heart size is mildly enlarged. There is no significant pericardial fluid, thickening or pericardial calcification. There is aortic atherosclerosis, as well as atherosclerosis of the great vessels of the mediastinum and the coronary arteries, including calcified atherosclerotic plaque in the left anterior descending coronary artery. Right internal jugular single-lumen Port-A-Cath with tip terminating in the distal superior vena cava. Mediastinum/Nodes: No pathologically enlarged mediastinal or hilar lymph nodes. Esophagus is unremarkable in appearance. No axillary lymphadenopathy. Lungs/Pleura: Previously noted clustered micronodularity in the left upper lobe posteriorly and superior segment of the left lower lobe has resolved. However, today's study demonstrates a new area of mass-like architectural distortion (axial image 42 of series 2) which currently measures 5.0 x 4.1 cm, with internal air bronchograms, and a small area of cavitation posteriorly (axial image 44 of series 4). This region appears very linear when viewed from sagittal or coronal perspectives. No new suspicious appearing pulmonary nodules or masses are otherwise noted elsewhere in the lungs. No pleural effusions. Musculoskeletal: There are no aggressive  appearing lytic or blastic lesions noted in the visualized portions of the skeleton. CT ABDOMEN PELVIS FINDINGS Hepatobiliary: Subcentimeter low-attenuation lesion in the central aspect of segment 4B of the liver (axial image 60 of series 2), too small to characterize, but similar to prior studies and statistically likely a tiny cyst (no imaging follow-up recommended). No other suspicious cystic or solid hepatic lesions. No intra or extrahepatic biliary ductal dilatation. Gallbladder is unremarkable in appearance. Pancreas: No pancreatic mass. No pancreatic ductal dilatation. No pancreatic or peripancreatic fluid collections or inflammatory changes. Spleen: Unremarkable. Adrenals/Urinary Tract: Bilateral kidneys and bilateral adrenal glands are normal in appearance. No hydroureteronephrosis. Circumferential thickening of the urinary bladder wall is noted, new compared to the prior examination. No discrete bladder mass confidently identified. Mild haziness in the fat surrounding the urinary bladder. Stomach/Bowel: The appearance of the stomach is normal. There is no pathologic dilatation of small bowel or colon. Normal appendix. Vascular/Lymphatic: Atherosclerosis in the abdominal aorta and pelvic vasculature, without evidence of aneurysm or dissection. Prominent inguinal lymph nodes are again noted measuring up to 1.1 cm in short axis on the  left, similar to slightly decreased in prominence compared to the prior study. No other pathologically enlarged abdominal or pelvic lymph nodes are noted. Reproductive: Status post total abdominal hysterectomy and bilateral salpingo-oophorectomy. Vagina is grossly unremarkable in appearance on today's examination. Other: No significant volume of ascites.  No pneumoperitoneum. Musculoskeletal: There are no aggressive appearing lytic or blastic lesions noted in the visualized portions of the skeleton. IMPRESSION: 1. Previously noted clustered nodularity in the upper left lung has  resolved compared to the prior study. However, today's study demonstrates a new mass-like area of architectural distortion with some internal air bronchograms and cavitation in the posterior aspect of the left upper lobe in the same region. Although this is mass-like in appearance on axial images, this is rather linear in appearance on coronal and sagittal reconstructions. If there has been radiation therapy to this region, this could simply represent an area of evolving postradiation mass-like fibrosis. In the absence of a history of radiation therapy, these findings are presumably of infectious etiology. Neoplasm is not favored, but difficult to entirely exclude. Close attention on follow-up studies is recommended to evaluate for the evolution of these findings. No other suspicious appearing pulmonary nodules or masses are noted on today's examination. 2. No definite signs of metastatic disease noted in the abdomen or pelvis. 3. Multiple prominent borderline enlarged and minimally enlarged bilateral inguinal lymph nodes, slightly less prominent than the prior examination. Continued attention on follow-up studies is recommended. 4. Severe circumferential thickening of the urinary bladder wall without discrete bladder wall mass. This is of uncertain etiology and significance, but correlation with urinalysis is recommended to exclude the possibility of cystitis. Attention on follow-up imaging is recommended to ensure resolution. 5. Aortic atherosclerosis, in addition to left anterior descending coronary artery disease. Please note that although the presence of coronary artery calcium documents the presence of coronary artery disease, the severity of this disease and any potential stenosis cannot be assessed on this non-gated CT examination. Assessment for potential risk factor modification, dietary therapy or pharmacologic therapy may be warranted, if clinically indicated. 6. Additional incidental findings, as above.  Electronically Signed   By: Vinnie Langton M.D.   On: 08/29/2022 09:29

## 2022-09-03 ENCOUNTER — Other Ambulatory Visit: Payer: Self-pay

## 2022-09-03 LAB — T4: T4, Total: 4.6 ug/dL (ref 4.5–12.0)

## 2022-09-05 ENCOUNTER — Other Ambulatory Visit: Payer: Self-pay | Admitting: Hematology and Oncology

## 2022-09-05 ENCOUNTER — Telehealth: Payer: Self-pay

## 2022-09-05 ENCOUNTER — Other Ambulatory Visit: Payer: Self-pay

## 2022-09-05 MED ORDER — SULFAMETHOXAZOLE-TRIMETHOPRIM 400-80 MG PO TABS: 1.0000 | ORAL_TABLET | Freq: Two times a day (BID) | ORAL | 0 refills | Status: DC

## 2022-09-05 MED ORDER — LEVOTHYROXINE SODIUM 150 MCG PO TABS: 150.0000 ug | ORAL_TABLET | Freq: Every day | ORAL | 1 refills | Status: DC

## 2022-09-05 NOTE — Telephone Encounter (Signed)
-----   Message from [name], MD sent at 09/05/2022  7:57 AM EST ----- Urine culture confirmed uTI I sent antibiotics to her pharmacy

## 2022-09-05 NOTE — Telephone Encounter (Signed)
Called and given below message. She verbalized understandign and appreciated the call. She will pick up Rx today and start.  Confirmed how she is taking synthroid Rx. She is taking daily 125 mcg Synthroid 30 mins before breakfast.

## 2022-09-05 NOTE — Telephone Encounter (Signed)
Synthroid 150 mcg sent to pharmacy. Attempted to call. No voicemail.

## 2022-09-09 LAB — SUSCEPTIBILITY RESULT

## 2022-09-09 LAB — SUSCEPTIBILITY, AER + ANAEROB: Source of Sample: 8680

## 2022-09-14 ENCOUNTER — Other Ambulatory Visit: Payer: Self-pay

## 2022-09-14 LAB — URINE CULTURE: Culture: >=100000 — AB

## 2022-09-16 IMAGING — CT CT CHEST-ABD-PELV W/ CM
3 of 6 series · 13 of 36 positions shown, 14 images · IV contrast (agent unspecified)
Comparison: Multiple exams, including 06/30/2022

CLINICAL DATA: Endometrioid carcinoma involving the cervix on
biopsy of 01/14/2021; a biopsy from 07/28/2021 revealed
adenocarcinoma on vaginal biopsy. Restaging assessment. Chemotherapy
and radiation therapy in progress.

* Tracking Code: BO *
EXAM:
CT CHEST, ABDOMEN, AND PELVIS WITH CONTRAST
TECHNIQUE: Multidetector CT imaging of the chest, abdomen and pelvis was
performed following the standard protocol during bolus
administration of intravenous contrast.

[Series 2: cap with · axial · 0.74mm/px · z∈[-574,-74]mm · 8 of 130 slices shown, 9 images]
[im 15/130  mediastinal]
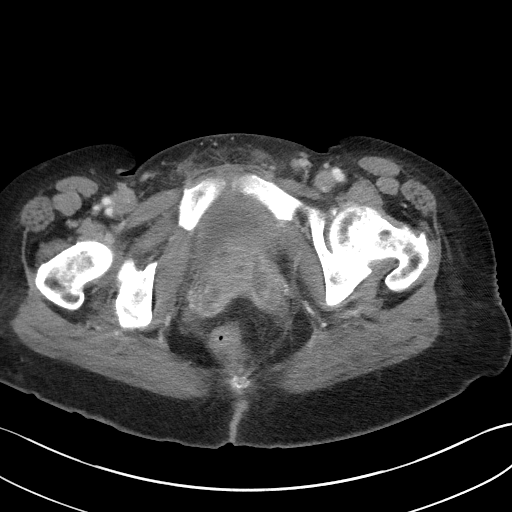
[im 15/130  bone]
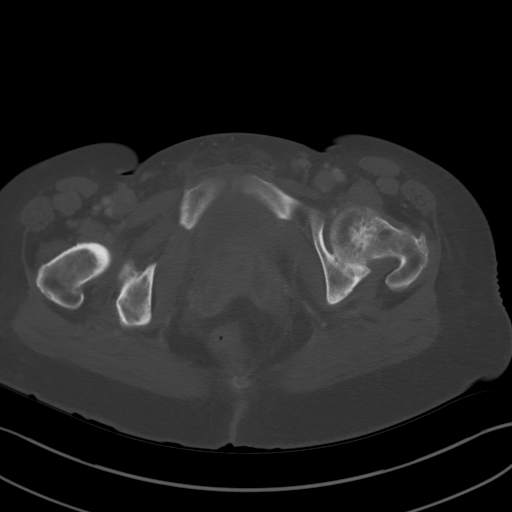
[im 29/130  mediastinal]
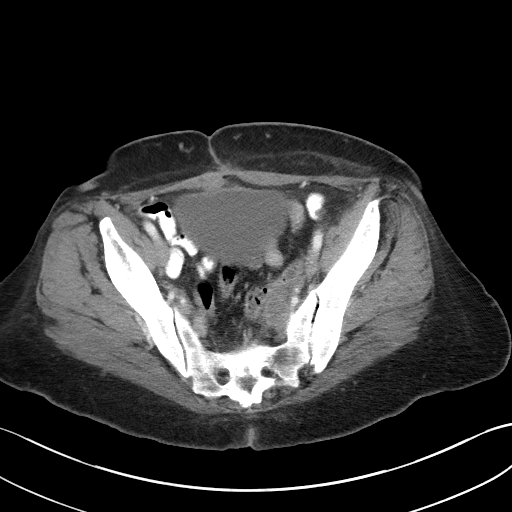
[im 44/130  mediastinal]
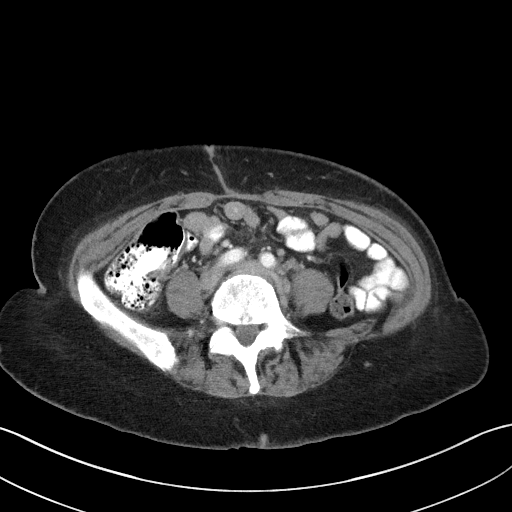
[im 58/130  mediastinal]
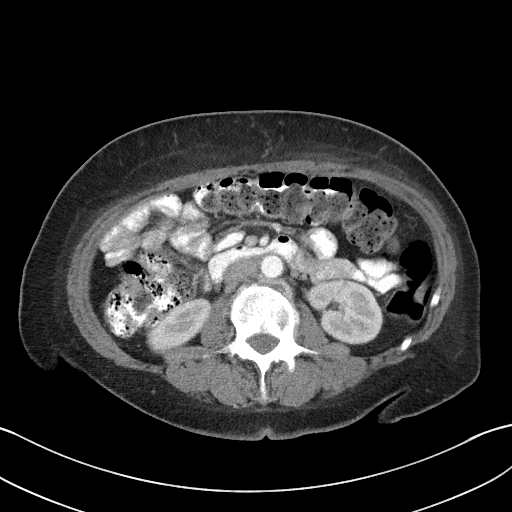
[im 72/130  mediastinal]
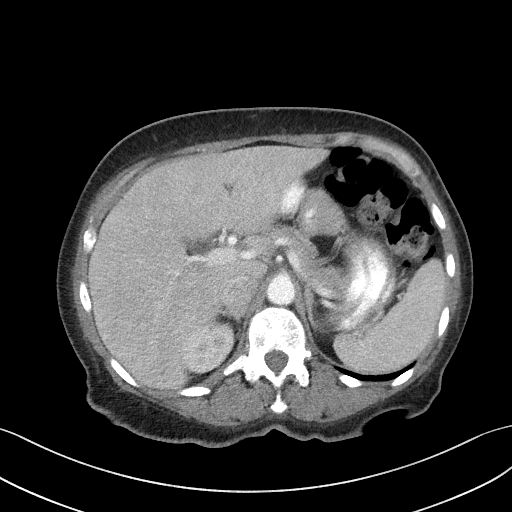
[im 87/130  mediastinal]
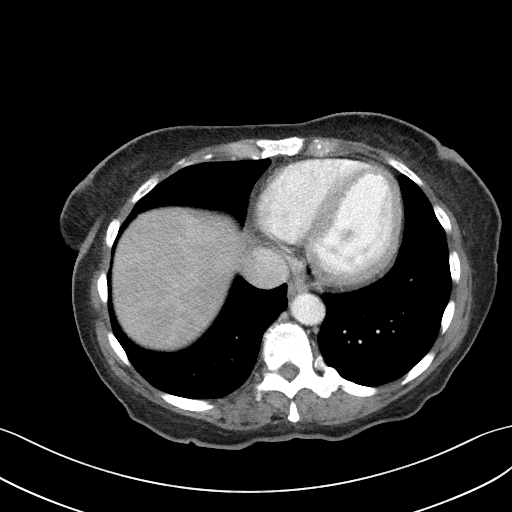
[im 101/130  mediastinal]
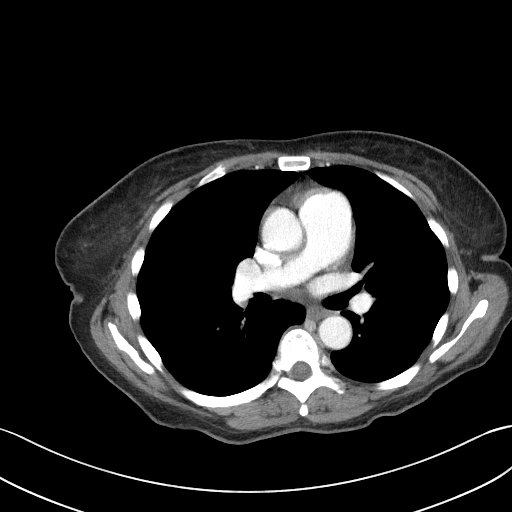
[im 115/130  mediastinal]
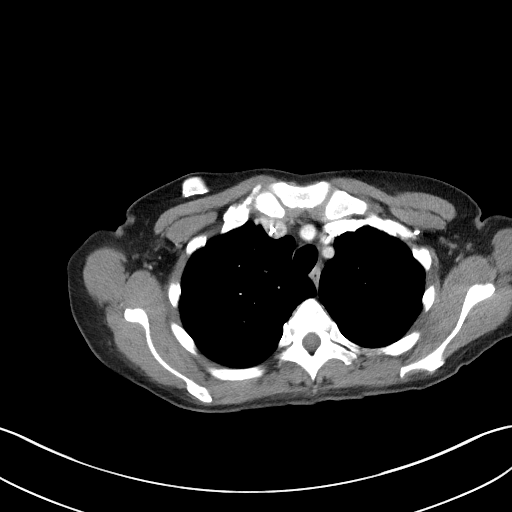

[Series 4: coronals · coronal · 0.78mm/px · 3 of 139 slices shown]
[im 28/139  mediastinal]
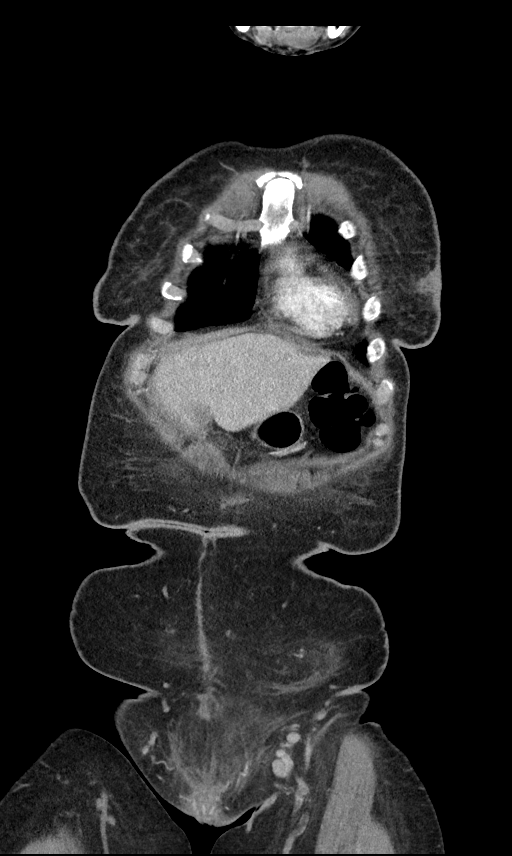
[im 56/139  mediastinal]
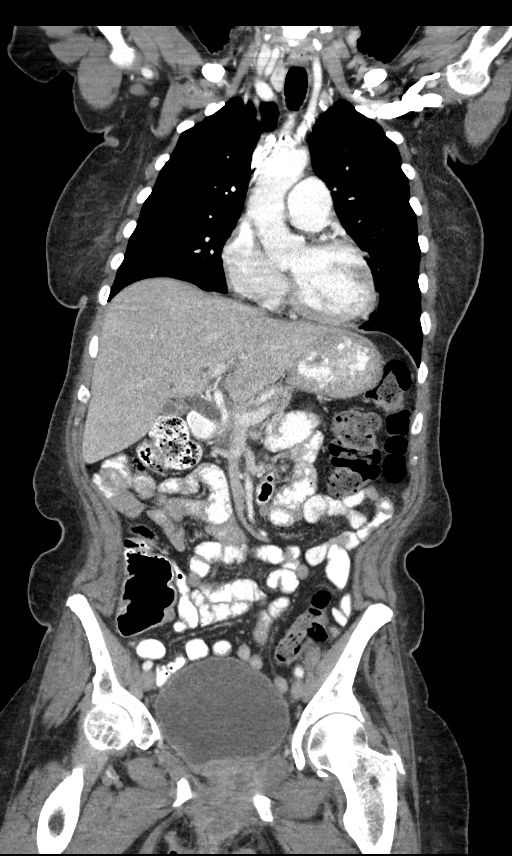
[im 83/139  mediastinal]
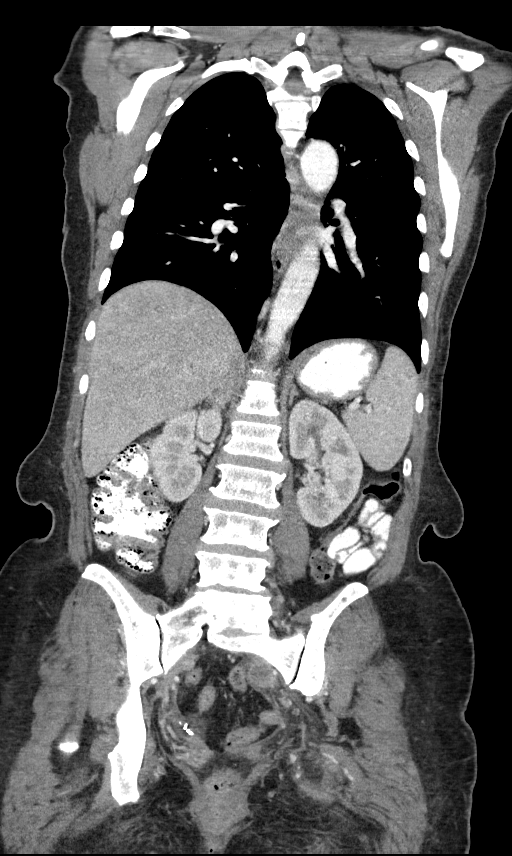

[Series 6: lung · axial · 0.74mm/px · z∈[-279,-251]mm · 2 of 154 slices shown]
[im 14/154  bone]
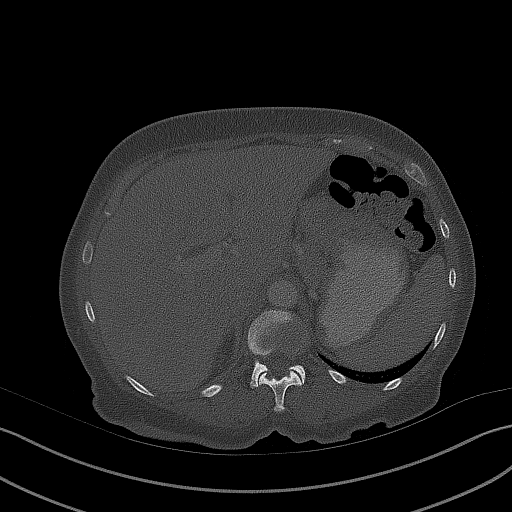
[im 28/154  bone]
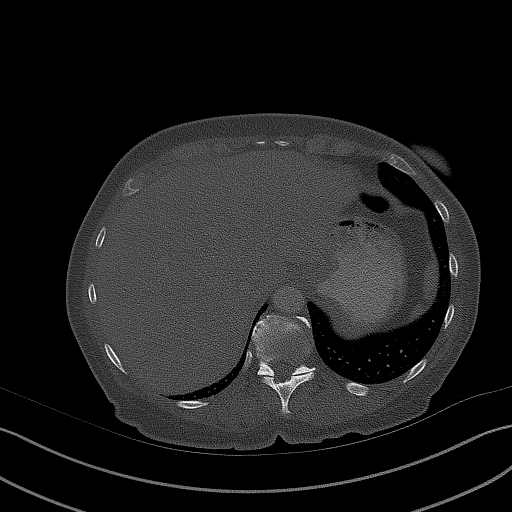

[13 of 36 positions shown; findings below may reference images not displayed]

RADIATION DOSE REDUCTION: This exam was performed according to the
departmental dose-optimization program which includes automated
exposure control, adjustment of the mA and/or kV according to
patient size and/or use of iterative reconstruction technique.

CONTRAST:  100mL OMNIPAQUE IOHEXOL 300 MG/ML  SOLN
FINDINGS: CT CHEST FINDINGS

Cardiovascular: Right Port-A-Cath tip: SVC. Mildly prominent main
pulmonary artery at 3.7 cm diameter, cannot exclude pulmonary
arterial hypertension. Mild atherosclerotic calcification of the
aortic arch.

Mediastinum/Nodes: No pathologic adenopathy identified in the chest.

Lungs/Pleura: 11 by 9 by 11 mm left upper lobe pulmonary nodule in
the apicoposterior segment abuts the top of the major fissure in the
adjacent pleural margin. No chest wall invasion or erosion of the
underlying fifth rib identified. Prior available cross-sectional
imaging has not included the upper chest, accordingly chronicity is
not established.

3 mm calcified benign granuloma in the left lower lobe on image 113
series 6.

Musculoskeletal: Unremarkable

CT ABDOMEN PELVIS FINDINGS

Hepatobiliary: Faint heterogeneity of enhancement in the initial
contrast phase is likely attributable to early phase of contrast.
There is some focal fatty infiltration in segment 4 adjacent to the
falciform ligament and a contracted gallbladder. No definite
significant hepatic lesion is identified.

Pancreas: Unremarkable

Spleen: Unremarkable

Adrenals/Urinary Tract: Stable right kidney upper pole cyst. Adrenal
glands normal. No intraluminal bladder mass is identified but there
is proximity of the base of the urinary bladder and the urethra to
the heterogeneous masslike enhancement along the vagina and perineum
without a well-defined separating tissue plane.

Stomach/Bowel: Ill definition of tissue planes between the anus and
the perineal soft tissue mass. Otherwise unremarkable.

Vascular/Lymphatic: Mild right common iliac artery atherosclerotic
calcification.

Increase conspicuity of left pelvic adenopathy including a left
iliac node at the iliac bifurcation measuring 2.0 cm in short axis
on image 96 of series 2 (indistinct tissue in this vicinity
previously 1.2 cm).

Left internal iliac adenopathy along the left posterior pelvic
sidewall/presacral region measuring 1.9 cm in short axis on image
103 series 2 (previous indistinct tissue in this region 0.8 cm).
This adenopathy abuts the left sacral plexus in the pelvis.

Reproductive: Uterus absent. Along the lower vaginal region,
vestibule, and adjacent perineum there is a 7.0 by 5.5 by 5.9 cm
(volume = 120 cm^3) heterogeneous soft tissue mass, with some gas.
Some of this may reflect normal vagina but this is inseparable from
presumed underlying mass. There is gas within the associated cavity
which extends down to the left vestibule/perineum and ulceration is
not excluded. Poor definition of tissue planes between this process
in the base of the urinary bladder as well as the anus. The mass
extends along the anterior margin of the left obturator internus
muscle on image 120 of series 2 and abuts the left inferior pubic
ramus medially without bony destructive findings at this time.

Other: Trace edema tracks along soft tissue planes in the pelvis,
particularly along the perirectal and pelvic sidewall soft tissue
planes.

Musculoskeletal: Transitional S1 vertebra. Lower lumbar spondylosis
and degenerative disc disease.
IMPRESSION: 1. 11 mm left upper lobe pulmonary nodule in the apicoposterior
segment abuts the posterior pleural margin. In this clinical context
the appearance is very concerning for a metastatic lesion although
we do not have prior cross-sectional imaging to assess stability.
Proximity to the fifth rib and adjacent major fissure might make
percutaneous biopsy tricky; PET-CT would also be one way of
assessing the metabolic activity/malignant potential of this lesion.
This may be a good candidate case for discussion at
multidisciplinary cancer conference.
2. Increased adenopathy along the left iliac chain, including an
internal iliac node which abuts the left sacral plexus.
3. The lower vaginal and vestibular mass measures about 7.0 by
by 5.9 cm and has some internal gas which may indicate ulceration
along the vestibule/perineum. Poor definition of tissue planes
between this process in the base of the urinary bladder as well as
the anus. The mass extends along the left anterior obturator
internus muscle.
4. Other imaging findings of potential clinical significance:
Prominent main pulmonary artery, cannot exclude pulmonary arterial
hypertension. Aortic Atherosclerosis ([id number]). Lower lumbar
spondylosis and degenerative disc disease.

## 2022-09-23 ENCOUNTER — Inpatient Hospital Stay: Payer: Medicare PPO

## 2022-09-23 ENCOUNTER — Encounter: Payer: Self-pay | Admitting: Hematology and Oncology

## 2022-09-23 ENCOUNTER — Inpatient Hospital Stay: Payer: Medicare PPO | Attending: Gynecologic Oncology | Admitting: Hematology and Oncology

## 2022-09-23 ENCOUNTER — Telehealth: Payer: Self-pay

## 2022-09-23 NOTE — Telephone Encounter (Signed)
Attempted to call regarding not showing up for appts. Unable to leave a message due to voicemail being full.

## 2022-09-27 ENCOUNTER — Other Ambulatory Visit: Payer: Self-pay | Admitting: Hematology and Oncology

## 2022-09-27 ENCOUNTER — Telehealth: Payer: Self-pay | Admitting: Hematology and Oncology

## 2022-09-27 NOTE — Telephone Encounter (Signed)
Per 3/19 IB reached out to patient to answer questions regarding upcoming appointments. Mailbox full.

## 2022-09-27 NOTE — Telephone Encounter (Signed)
Spoke with patient confirming upcoming appointments  

## 2022-09-30 ENCOUNTER — Other Ambulatory Visit: Payer: Medicare PPO

## 2022-09-30 ENCOUNTER — Ambulatory Visit: Payer: Medicare PPO

## 2022-09-30 ENCOUNTER — Ambulatory Visit: Payer: Medicare PPO | Admitting: Hematology and Oncology

## 2022-10-11 ENCOUNTER — Other Ambulatory Visit: Payer: Self-pay

## 2022-10-13 ENCOUNTER — Other Ambulatory Visit: Payer: Self-pay

## 2022-10-20 ENCOUNTER — Other Ambulatory Visit: Payer: Self-pay | Admitting: Hematology and Oncology

## 2022-10-21 ENCOUNTER — Encounter: Payer: Self-pay | Admitting: Hematology and Oncology

## 2022-10-21 ENCOUNTER — Inpatient Hospital Stay (HOSPITAL_BASED_OUTPATIENT_CLINIC_OR_DEPARTMENT_OTHER): Payer: Medicare PPO | Admitting: Hematology and Oncology

## 2022-10-21 ENCOUNTER — Inpatient Hospital Stay: Payer: Medicare PPO | Attending: Gynecologic Oncology

## 2022-10-21 ENCOUNTER — Inpatient Hospital Stay: Payer: Medicare PPO

## 2022-10-21 ENCOUNTER — Other Ambulatory Visit: Payer: Self-pay

## 2022-10-21 ENCOUNTER — Other Ambulatory Visit: Payer: Self-pay | Admitting: Hematology and Oncology

## 2022-10-21 VITALS — BP 149/65 | HR 60 | Temp 98.1°F | Resp 18 | Ht 63.0 in | Wt 222.4 lb

## 2022-10-21 VITALS — BP 142/62 | HR 59 | Resp 18

## 2022-10-21 DIAGNOSIS — C7800 Secondary malignant neoplasm of unspecified lung: Secondary | ICD-10-CM | POA: Insufficient documentation

## 2022-10-21 DIAGNOSIS — Z90721 Acquired absence of ovaries, unilateral: Secondary | ICD-10-CM | POA: Insufficient documentation

## 2022-10-21 DIAGNOSIS — E039 Hypothyroidism, unspecified: Secondary | ICD-10-CM | POA: Diagnosis not present

## 2022-10-21 DIAGNOSIS — C55 Malignant neoplasm of uterus, part unspecified: Secondary | ICD-10-CM | POA: Insufficient documentation

## 2022-10-21 DIAGNOSIS — Z5112 Encounter for antineoplastic immunotherapy: Secondary | ICD-10-CM | POA: Insufficient documentation

## 2022-10-21 DIAGNOSIS — D539 Nutritional anemia, unspecified: Secondary | ICD-10-CM | POA: Diagnosis not present

## 2022-10-21 DIAGNOSIS — Z9079 Acquired absence of other genital organ(s): Secondary | ICD-10-CM | POA: Insufficient documentation

## 2022-10-21 DIAGNOSIS — Z90710 Acquired absence of both cervix and uterus: Secondary | ICD-10-CM | POA: Diagnosis not present

## 2022-10-21 LAB — CBC WITH DIFFERENTIAL (CANCER CENTER ONLY)
Abs Immature Granulocytes: 0 10*3/uL (ref 0.00–0.07)
Basophils Absolute: 0.1 10*3/uL (ref 0.0–0.1)
Basophils Relative: 2 %
Eosinophils Absolute: 0.2 10*3/uL (ref 0.0–0.5)
Eosinophils Relative: 7 %
HCT: 34.7 % — ABNORMAL LOW (ref 36.0–46.0)
Hemoglobin: 11.4 g/dL — ABNORMAL LOW (ref 12.0–15.0)
Immature Granulocytes: 0 %
Lymphocytes Relative: 41 %
Lymphs Abs: 1.3 10*3/uL (ref 0.7–4.0)
MCH: 23.7 pg — ABNORMAL LOW (ref 26.0–34.0)
MCHC: 32.9 g/dL (ref 30.0–36.0)
MCV: 72.1 fL — ABNORMAL LOW (ref 80.0–100.0)
Monocytes Absolute: 0.3 10*3/uL (ref 0.1–1.0)
Monocytes Relative: 9 %
Neutro Abs: 1.3 10*3/uL — ABNORMAL LOW (ref 1.7–7.7)
Neutrophils Relative %: 41 %
Platelet Count: 230 10*3/uL (ref 150–400)
RBC: 4.81 MIL/uL (ref 3.87–5.11)
RDW: 17 % — ABNORMAL HIGH (ref 11.5–15.5)
WBC Count: 3.1 10*3/uL — ABNORMAL LOW (ref 4.0–10.5)
nRBC: 0 % (ref 0.0–0.2)

## 2022-10-21 LAB — CMP (CANCER CENTER ONLY)
ALT: 15 U/L (ref 0–44)
AST: 14 U/L — ABNORMAL LOW (ref 15–41)
Albumin: 4 g/dL (ref 3.5–5.0)
Alkaline Phosphatase: 92 U/L (ref 38–126)
Anion gap: 6 (ref 5–15)
BUN: 22 mg/dL (ref 8–23)
CO2: 27 mmol/L (ref 22–32)
Calcium: 9.2 mg/dL (ref 8.9–10.3)
Chloride: 106 mmol/L (ref 98–111)
Creatinine: 0.73 mg/dL (ref 0.44–1.00)
GFR, Estimated: >60 mL/min (ref >60)
Glucose, Bld: 85 mg/dL (ref 70–99)
Potassium: 4.1 mmol/L (ref 3.5–5.1)
Sodium: 139 mmol/L (ref 135–145)
Total Bilirubin: 0.3 mg/dL (ref 0.3–1.2)
Total Protein: 7.3 g/dL (ref 6.5–8.1)

## 2022-10-21 LAB — TSH: TSH: 4.676 u[IU]/mL — ABNORMAL HIGH (ref 0.350–4.500)

## 2022-10-21 MED ORDER — HEPARIN SOD (PORK) LOCK FLUSH 100 UNIT/ML IV SOLN
500.0000 [IU] | Freq: Once | INTRAVENOUS | Status: AC | PRN
  Administered 2022-10-21: 500 [IU]

## 2022-10-21 MED ORDER — SODIUM CHLORIDE 0.9 % IV SOLN
200.0000 mg | Freq: Once | INTRAVENOUS | Status: AC
  Administered 2022-10-21: 200 mg via INTRAVENOUS
  Filled 2022-10-21: qty 200

## 2022-10-21 MED ORDER — SODIUM CHLORIDE 0.9% FLUSH
10.0000 mL | INTRAVENOUS | Status: DC | PRN
  Administered 2022-10-21: 10 mL

## 2022-10-21 MED ORDER — CYANOCOBALAMIN 1000 MCG/ML IJ SOLN
1000.0000 ug | Freq: Once | INTRAMUSCULAR | Status: AC
  Administered 2022-10-21: 1000 ug via INTRAMUSCULAR
  Filled 2022-10-21: qty 1

## 2022-10-21 MED ORDER — SODIUM CHLORIDE 0.9 % IV SOLN: Freq: Once | INTRAVENOUS | Status: AC

## 2022-10-21 MED ORDER — SODIUM CHLORIDE 0.9% FLUSH
10.0000 mL | Freq: Once | INTRAVENOUS | Status: AC
  Administered 2022-10-21: 10 mL

## 2022-10-21 NOTE — Patient Instructions (Signed)
[facility]  Discharge Instructions: Thank you for choosing [facility] to provide your oncology and hematology care.   If you have a lab appointment with the Cancer Center, please go directly to the Cancer Center and check in at the registration area.   Wear comfortable clothing and clothing appropriate for easy access to any Portacath or PICC line.   We strive to give you quality time with your provider. You may need to reschedule your appointment if you arrive late (15 or more minutes).  Arriving late affects you and other patients whose appointments are after yours.  Also, if you miss three or more appointments without notifying the office, you may be dismissed from the clinic at the provider's discretion.      For prescription refill requests, have your pharmacy contact our office and allow 72 hours for refills to be completed.    Today you received the following chemotherapy and/or immunotherapy agents: Keytruda      To help prevent nausea and vomiting after your treatment, we encourage you to take your nausea medication as directed.  BELOW ARE SYMPTOMS THAT SHOULD BE REPORTED IMMEDIATELY: *FEVER GREATER THAN 100.4 F (38 C) OR HIGHER *CHILLS OR SWEATING *NAUSEA AND VOMITING THAT IS NOT CONTROLLED WITH YOUR NAUSEA MEDICATION *UNUSUAL SHORTNESS OF BREATH *UNUSUAL BRUISING OR BLEEDING *URINARY PROBLEMS (pain or burning when urinating, or frequent urination) *BOWEL PROBLEMS (unusual diarrhea, constipation, pain near the anus) TENDERNESS IN MOUTH AND THROAT WITH OR WITHOUT PRESENCE OF ULCERS (sore throat, sores in mouth, or a toothache) UNUSUAL RASH, SWELLING OR PAIN  UNUSUAL VAGINAL DISCHARGE OR ITCHING   Items with * indicate a potential emergency and should be followed up as soon as possible or go to the Emergency Department if any problems should occur.  Please show the CHEMOTHERAPY ALERT CARD or IMMUNOTHERAPY ALERT CARD at  check-in to the Emergency Department and triage nurse.  Should you have questions after your visit or need to cancel or reschedule your appointment, please contact [facility]  Dept: [phone]  and follow the prompts.  Office hours are 8:00 a.m. to 4:30 p.m. Monday - Friday. Please note that voicemails left after 4:00 p.m. may not be returned until the following business day.  We are closed weekends and major holidays. You have access to a nurse at all times for urgent questions. Please call the main number to the clinic Dept: [phone] and follow the prompts.   For any non-urgent questions, you may also contact your provider using MyChart. We now offer e-Visits for anyone 18 and older to request care online for non-urgent symptoms. For details visit [email or web address].   Also download the MyChart app! Go to the app store, search "MyChart", open the app, select [facility], and log in with your MyChart username and password.   

## 2022-10-21 NOTE — Assessment & Plan Note (Signed)
Recent blood work suggested B12 deficiency She will get B12 injection along with chemotherapy with every cycle 

## 2022-10-21 NOTE — Progress Notes (Signed)
[facility] OFFICE PROGRESS NOTE  Patient Care Team: Pcp, No as PCP - General  ASSESSMENT & PLAN:  Uterine cancer (HCC) She has difficulties keeping her appointments I recommend changing her future doses to every 6 weeks and she is in agreement  Hypothyroidism She has gained some weight TSH is pending I will call her with test results and adjust the dose of Synthroid as needed  Deficiency anemia Recent blood work suggested B12 deficiency She will get B12 injection along with chemotherapy with every cycle  Orders Placed This Encounter  Procedures   CBC with Differential (Cancer Center Only)    Standing Status:   Future    Standing Expiration Date:   12/09/2023   CMP (Cancer Center only)    Standing Status:   Future    Standing Expiration Date:   12/09/2023   T4    Standing Status:   Future    Standing Expiration Date:   12/09/2023   TSH    Standing Status:   Future    Standing Expiration Date:   12/09/2023    All questions were answered. The patient knows to call the clinic with any problems, questions or concerns. The total time spent in the appointment was 25 minutes encounter with patients including review of chart and various tests results, discussions about plan of care and coordination of care plan   Artis Delay, MD 10/21/2022 12:30 PM  INTERVAL HISTORY: Please see below for problem oriented charting. she returns for treatment follow-up on maintenance treatment with pembrolizumab She missed her treatment again This has been rescheduled She has gained some weight She is compliant taking her Synthroid as directed No recent infection  REVIEW OF SYSTEMS:   Constitutional: Denies fevers, chills or abnormal weight loss Eyes: Denies blurriness of vision Ears, nose, mouth, throat, and face: Denies mucositis or sore throat Respiratory: Denies cough, dyspnea or wheezes Cardiovascular: Denies palpitation, chest discomfort or lower extremity  swelling Gastrointestinal:  Denies nausea, heartburn or change in bowel habits Skin: Denies abnormal skin rashes Lymphatics: Denies new lymphadenopathy or easy bruising Neurological:Denies numbness, tingling or new weaknesses Behavioral/Psych: Mood is stable, no new changes  All other systems were reviewed with the patient and are negative.  I have reviewed the past medical history, past surgical history, social history and family history with the patient and they are unchanged from previous note.  ALLERGIES:  has No Known Allergies.  MEDICATIONS:  Current Outpatient Medications  Medication Sig Dispense Refill   acetaminophen (TYLENOL) 325 MG tablet Take 650 mg by mouth at bedtime as needed (pain).     levothyroxine (SYNTHROID) 150 MCG tablet TAKE 1 TABLET BY MOUTH DAILY BEFORE BREAKFAST. 90 tablet 1   magnesium hydroxide (MILK OF MAGNESIA) 400 MG/5ML suspension Take 30 mLs by mouth daily.     polyethylene glycol (MIRALAX / GLYCOLAX) 17 g packet Take 17 g by mouth daily. (Patient taking differently: Take 17 g by mouth daily as needed for mild constipation.) 30 each 1   No current facility-administered medications for this visit.    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   Uterine cancer  01/05/2021 Imaging   Outside CT imaging  Bilateral hydronephrosis related extrinsic compression on the ureters by the enlarged uterus.  Multiple calcified uterine fibroids although peripherally enhancing diffuse decreased attenuation is noted centrally within the uterus highly suspicious for endometrial neoplasm given the current history of vaginal bleeding. Further evaluation is recommended. MRI may be helpful. Additionally  tissue sampling would likely be helpful.   Hypodensity in the pancreas at the junction of the head and body of the pancreas. This may be related to volume averaging although the possibility of a cystic lesion could not be totally excluded. MRI is recommended for further evaluation.    01/05/2021 Initial Diagnosis   The patient reported at least 1 year of postmenopausal bleeding.  She does not have a primary care provider or gynecologist.  She had known fibroids and had noted that she needed a hysterectomy but had not gotten around to seeking care for this.  She presented to the [facility] emergency department on January 05, 2021 with new onset right lower quadrant pain and this prompted a CT scan of the abdomen and pelvis to be performed on 01/05/2021.     01/14/2021 Pathology Results   FINAL MICROSCOPIC DIAGNOSIS:   A. CERVIX, ENDOCERVIX, BIOPSY:  -  Endometrioid carcinoma, FIGO grade 1  -  See comment    01/26/2021 Imaging   MRI abdomen  1. No suspicious pancreatic mass identified. 2. Moderate to severe bilateral hydroureteronephrosis, similar to recent CT the abdomen and pelvis, presumably related to extrinsic ureteral compression from the patient's enlarged uterus, as demonstrated on recent CT the abdomen and pelvis 01/05/2021.   02/01/2021 Surgery   Surgery in [facility]  Date of Service: February 01, 2021 3:13 PM  Preoperative Diagnoses: Endometrial cancer  Postoperative Diagnoses: Endometrial cancer  Procedures: Total abdominal hysterectomy, bilateral salpingo-oophorectomy, left pelvic lymphadenectomy, right para-aortic lymphadenectomy  Surgeon: Adolphus Birchwood, MD  Findings: Tumor appreciated in the cervical canal. Large, broad, multi fibroid uterus with a calcified fibroid filling the posterior cul de sac, an anterior lower uterine segment fibroid, and bilateral fibroids near the level of the cardinal ligament. No evidence of intraperitoneal disease. Normal appearing bilateral tubes and ovaries.  Bilateral hydroureter from uterine compression. Tumor excreted from specimen into the pelvis at time of removal. An approximately 2cm enlarged left pelvic lymph node and a 1cm enlarged right aorta caval lymph node were identified.     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) - Right ovary, negative for tumor - Bilateral fallopian tubes, negative for tumor - Multiple leiomyomata (up to 8.5 cm)   B: Lymph node, aortocaval, excision - Metastatic carcinoma involving 1 lymph node (0.4 cm) (1/1)   C: Lymph node, left pelvic, excision -Metastatic carcinoma involving 1 lymph node (0.3 cm) (1/1)   Diagnosis Comment    The majority of this tumor is composed of well differentiated (overall grade 2, but with areas consistent with grade 1) endometrioid adenocarcinoma with squamous and mucinous differentiation (approximately 70%); multifocally there is abrupt transition to an undifferentiated malignancy with a high-grade monotonous noncohesive appearance (best seen in slide A2) consistent with undifferentiated carcinoma.  The overall morphologic and immunophenotypic findings are consistent with a dedifferentiated endometrial carcinoma.   Of note, the carcinoma present in the lymph nodes is well differentiated endometrioid carcinoma and the metastatic deposit in the ovary is  undifferentiated carcinoma. Carcinoma is present less than 1 mm from uterine corpus serosa (in the lower uterine segment).  The assessment of the paracervical soft tissue margins is challenging due to the disrupted nature of the specimen in this area, however tumor appears present less than  1 mm from paracervical soft tissue.  No definite parametrial invasion is identified.   Immunohistochemistry performed at [facility] (with appropriate controls) demonstrates the following immunoprofile in cells of interest: Well differentiated endometrioid carcinoma: CK AE1/AE3: Positive ER: Positive (>90%, strong staining) P53: Favor wild-type (see comment below)   Dedifferentiated carcinoma: CK AE1/AE3: Very rare cells, predominantly negative ER: Negative P53: Small focus of mutant type staining in a background of wild-type staining (see comment below)   P53 staining comment: In the well differentiated carcinoma component the staining pattern is predominantly wild-type although focally there is slightly increased levels of staining noted.  In the dedifferentiated tumor component, most of the tissue exhibits wild-type pattern staining, however there is a distinct small area that exhibits increased/overexpressed mutant pattern p53 staining.    Immunohistochemistry testing for mismatch repair (MMR) proteins is performed on block A2:   MLH1:  Loss of nuclear expression MSH2:  Intact nuclear expression MSH6:  Intact nuclear expression PMS2:  Loss of nuclear expression   Loss of nuclear expression of MLH1 and PMS2 can be a result of sporadic microsatellite instability (via hypermethylation of the MLH1 promoter region) or due to Lynch syndrome / HNPCC. Materials will be submitted to the [facility] [phone]) for [facility] promoter hypermethylation testing. These results will be issued in a separate report.      03/05/2021 Initial Diagnosis   Uterine cancer (HCC)   03/05/2021 Cancer Staging   Staging form: Corpus Uteri - Carcinoma and Carcinosarcoma, AJCC 8th Edition - Pathologic stage from 03/05/2021: FIGO Stage IVB (rpT3, pN2a, cM1) - Signed by Artis Delay, MD on 11/12/2021 Stage prefix: Recurrence   03/11/2021 Imaging   1. Status post interval hysterectomy and oophorectomy. 2.  There is a rim enhancing soft tissue mass centered in the vagina, measuring approximately 4.4 x 3.1 cm. This is new, or significantly enlarged compared to prior examination dated 01/05/2021. Findings are concerning for malignant involvement. Correlate with physical examination. Contrast enhanced MRI may be helpful to further detail soft tissue anatomy of the low pelvis if indicated. 3. There is at least one newly enlarged left iliac lymph node measuring up to 1.4 x 1.3 cm, nonspecific although concerning for nodal metastatic disease. 4. Fluid attenuation lymphocele or seroma adjacent to surgical clip at the level right ovarian vein. 5. Status post interval midline laparotomy. Subcutaneous fluid within the inferior aspect of the wound. The presence or absence of infection is not established by CT.     03/23/2021 - 07/09/2021 Chemotherapy   Patient is on Treatment Plan : UTERINE Carboplatin AUC 6 / Paclitaxel q21d     03/23/2021 Procedure   Placement of a subcutaneous power-injectable port device. Catheter tip at the superior cavoatrial junction   07/30/2021 Imaging   1. Large ill-defined partially enhancing and likely centrally necrotic vaginal and paravaginal mass. Recommend correlation with direct visualization. MRI pelvis without and with contrast may be helpful for further evaluation. 2. No findings suspicious for omental or peritoneal surface disease. No adenopathy.   08/09/2021 - 09/08/2021 Radiation Therapy   Indication for treatment:  Curative       Radiation treatment dates:   08/09/21 through 09/08/21 Site/dose:  IMRT Uterus; Vagina : 41.5 Gy delivered in 23  Fx with 1.80 Gy/Fx (Planned dosage was for 45.00 GY delivered in 25 Fx with 1.80 Gy/Fx, her final 2 treatments (and boost) was canceled secondary to significant radiation reaction)   Beams/energy: 6X   11/11/2021 Imaging   1. 11 mm left upper lobe pulmonary nodule in the apicoposterior segment abuts the posterior pleural margin. In  this clinical context the appearance is very concerning for a metastatic lesion although we do not have prior cross-sectional imaging to assess stability. Proximity to the fifth rib and adjacent major fissure might make percutaneous biopsy tricky; PET-CT would also be one way of assessing the metabolic activity/malignant potential of this lesion. This may be a good candidate case for discussion at multidisciplinary cancer conference. 2. Increased adenopathy along the left iliac chain, including an internal iliac node which abuts the left sacral plexus. 3. The lower vaginal and vestibular mass measures about 7.0 by 5.5 by 5.9 cm and has some internal gas which may indicate ulceration along the vestibule/perineum. Poor definition of tissue planes between this process in the base of the urinary bladder as well as the anus. The mass extends along the left anterior obturator internus muscle. 4. Other imaging findings of potential clinical significance: Prominent main pulmonary artery, cannot exclude pulmonary arterial hypertension. Aortic Atherosclerosis (ICD10-I70.0). Lower lumbar spondylosis and degenerative disc disease.   11/19/2021 - 03/11/2022 Chemotherapy   Patient is on Treatment Plan : UTERINE Pembrolizumab (200) q21d     11/19/2021 -  Chemotherapy   Patient is on Treatment Plan : UTERINE Pembrolizumab (200) q21d     01/21/2022 Imaging   1. Status post hysterectomy. Generally diminished size of an ulcerated mass involving the vagina when compared to prior examination, difficult to accurately measure due to poor resolution of adjacent soft tissues. 2. Significant interval decrease in size of left pelvic sidewall and iliac lymphadenopathy. 3. Interval enlargement of a pulmonary nodule of the posterior left upper lobe. 4. Constellation of findings is consistent with mixed response to treatment, with treatment response of primary tumor and nodal metastatic disease but with clear evidence of an enlarging  pulmonary metastasis. 5. New, diffuse urinary bladder wall thickening and adjacent fat stranding, consistent with nonspecific infectious or inflammatory cystitis, most likely related to local radiation therapy.   04/22/2022 Imaging   1. Resolution of previously noted soft tissue mass associated with the apex of the vagina. 2. Regression of previously noted left upper lobe pulmonary nodule. However, there are multiple new satellite nodules in the adjacent lung parenchyma in the upper left lung, largest of which measures 1.2 x 0.6 cm in the superior segment of the left lower lobe. Close attention on follow-up studies is recommended. 3. Interval development of mildly enlarged bilateral inguinal lymph nodes. These could be reactive, however, the possibility of locoregional nodal metastases is not excluded, and close attention on follow-up imaging is recommended to ensure regression of these findings. 4. Aortic atherosclerosis, in addition to left anterior descending coronary artery disease. Assessment for potential risk factor modification, dietary therapy or pharmacologic therapy may be warranted, if clinically indicated.  5. Additional incidental findings, as above.   08/29/2022 Imaging   1. Previously noted clustered nodularity in the upper left lung has resolved compared to the prior study. However, today's study demonstrates a new mass-like area of architectural distortion with some internal air bronchograms and cavitation in the posterior aspect of the left upper lobe in the same region. Although this is mass-like in appearance on axial images, this is rather linear in  appearance on coronal and sagittal reconstructions. If there has been radiation therapy to this region, this could simply represent an area of evolving postradiation mass-like fibrosis. In the absence of a history of radiation therapy, these findings are presumably of infectious etiology. Neoplasm is not favored, but difficult to entirely  exclude. Close attention on follow-up studies is recommended to evaluate for the evolution of these findings. No other suspicious appearing pulmonary nodules or masses are noted on today's examination. 2. No definite signs of metastatic disease noted in the abdomen or pelvis. 3. Multiple prominent borderline enlarged and minimally enlarged bilateral inguinal lymph nodes, slightly less prominent than the prior examination. Continued attention on follow-up studies is recommended. 4. Severe circumferential thickening of the urinary bladder wall without discrete bladder wall mass. This is of uncertain etiology and significance, but correlation with urinalysis is recommended to exclude the possibility of cystitis. Attention on follow-up imaging is recommended to ensure resolution. 5. Aortic atherosclerosis, in addition to left anterior descending coronary artery disease. Please note that although the presence of coronary artery calcium documents the presence of coronary artery disease, the severity of this disease and any potential stenosis cannot be assessed on this non-gated CT examination. Assessment for potential risk factor modification, dietary therapy or pharmacologic therapy may be warranted, if clinically indicated. 6. Additional incidental findings, as above.       PHYSICAL EXAMINATION: ECOG PERFORMANCE STATUS: 1 - Symptomatic but completely ambulatory  GENERAL:alert, no distress and comfortable  NEURO: alert & oriented x 3 with fluent speech, no focal motor/sensory deficits  LABORATORY DATA:  I have reviewed the data as listed    Component Value Date/Time   NA 139 09/02/2022 0834   K 4.2 09/02/2022 0834   CL 107 09/02/2022 0834   CO2 26 09/02/2022 0834   GLUCOSE 109 (H) 09/02/2022 0834   BUN 18 09/02/2022 0834   CREATININE 0.77 09/02/2022 0834   CALCIUM 8.5 (L) 09/02/2022 0834   PROT 7.5 09/02/2022 0834   ALBUMIN 3.7 09/02/2022 0834   AST 12 (L) 09/02/2022 0834   ALT 26  09/02/2022 0834   ALKPHOS 141 (H) 09/02/2022 0834   BILITOT 0.2 (L) 09/02/2022 0834   GFRNONAA >60 09/02/2022 0834    No results found for: "SPEP", "UPEP"  Lab Results  Component Value Date   WBC 4.6 09/02/2022   NEUTROABS 2.9 09/02/2022   HGB 10.4 (L) 09/02/2022   HCT 32.7 (L) 09/02/2022   MCV 74.1 (L) 09/02/2022   PLT 387 09/02/2022      Chemistry      Component Value Date/Time   NA 139 09/02/2022 0834   K 4.2 09/02/2022 0834   CL 107 09/02/2022 0834   CO2 26 09/02/2022 0834   BUN 18 09/02/2022 0834   CREATININE 0.77 09/02/2022 0834      Component Value Date/Time   CALCIUM 8.5 (L) 09/02/2022 0834   ALKPHOS 141 (H) 09/02/2022 0834   AST 12 (L) 09/02/2022 0834   ALT 26 09/02/2022 0834   BILITOT 0.2 (L) 09/02/2022 [id number]

## 2022-10-21 NOTE — Assessment & Plan Note (Signed)
She has difficulties keeping her appointments I recommend changing her future doses to every 6 weeks and she is in agreement

## 2022-10-21 NOTE — Progress Notes (Signed)
Per Dr Bertis Ruddy, ok to proceed with ANC 1.3 today

## 2022-10-21 NOTE — Assessment & Plan Note (Signed)
She has gained some weight TSH is pending I will call her with test results and adjust the dose of Synthroid as needed 

## 2022-10-22 LAB — T4: T4, Total: 7.2 ug/dL (ref 4.5–12.0)

## 2022-10-23 ENCOUNTER — Other Ambulatory Visit: Payer: Self-pay

## 2022-10-24 ENCOUNTER — Telehealth: Payer: Self-pay

## 2022-10-24 NOTE — Telephone Encounter (Signed)
Returned her call. Unable to leave a message due to her voicemail being full.

## 2022-10-25 ENCOUNTER — Encounter: Payer: Self-pay | Admitting: Hematology and Oncology

## 2022-11-11 ENCOUNTER — Encounter: Payer: Self-pay | Admitting: Hematology and Oncology

## 2022-11-11 ENCOUNTER — Other Ambulatory Visit: Payer: Self-pay

## 2022-11-11 ENCOUNTER — Inpatient Hospital Stay: Payer: Medicare PPO

## 2022-11-11 ENCOUNTER — Inpatient Hospital Stay: Payer: Medicare PPO | Attending: Gynecologic Oncology

## 2022-11-11 ENCOUNTER — Inpatient Hospital Stay (HOSPITAL_BASED_OUTPATIENT_CLINIC_OR_DEPARTMENT_OTHER): Payer: Medicare PPO | Admitting: Hematology and Oncology

## 2022-11-11 VITALS — BP 147/74 | HR 60 | Resp 18 | Ht 63.0 in | Wt 220.8 lb

## 2022-11-11 VITALS — BP 149/70 | HR 70 | Resp 16

## 2022-11-11 DIAGNOSIS — E039 Hypothyroidism, unspecified: Secondary | ICD-10-CM | POA: Diagnosis not present

## 2022-11-11 DIAGNOSIS — D539 Nutritional anemia, unspecified: Secondary | ICD-10-CM | POA: Insufficient documentation

## 2022-11-11 DIAGNOSIS — Z90722 Acquired absence of ovaries, bilateral: Secondary | ICD-10-CM | POA: Diagnosis not present

## 2022-11-11 DIAGNOSIS — C7800 Secondary malignant neoplasm of unspecified lung: Secondary | ICD-10-CM | POA: Diagnosis not present

## 2022-11-11 DIAGNOSIS — Z79899 Other long term (current) drug therapy: Secondary | ICD-10-CM | POA: Diagnosis not present

## 2022-11-11 DIAGNOSIS — Z90710 Acquired absence of both cervix and uterus: Secondary | ICD-10-CM | POA: Insufficient documentation

## 2022-11-11 DIAGNOSIS — C55 Malignant neoplasm of uterus, part unspecified: Secondary | ICD-10-CM | POA: Insufficient documentation

## 2022-11-11 DIAGNOSIS — Z9079 Acquired absence of other genital organ(s): Secondary | ICD-10-CM | POA: Insufficient documentation

## 2022-11-11 DIAGNOSIS — Z5112 Encounter for antineoplastic immunotherapy: Secondary | ICD-10-CM | POA: Insufficient documentation

## 2022-11-11 LAB — CMP (CANCER CENTER ONLY)
ALT: 19 U/L (ref 0–44)
AST: 17 U/L (ref 15–41)
Albumin: 4.1 g/dL (ref 3.5–5.0)
Alkaline Phosphatase: 81 U/L (ref 38–126)
Anion gap: 5 (ref 5–15)
BUN: 16 mg/dL (ref 8–23)
CO2: 25 mmol/L (ref 22–32)
Calcium: 8.9 mg/dL (ref 8.9–10.3)
Chloride: 110 mmol/L (ref 98–111)
Creatinine: 0.77 mg/dL (ref 0.44–1.00)
GFR, Estimated: >60 mL/min (ref >60)
Glucose, Bld: 90 mg/dL (ref 70–99)
Potassium: 4 mmol/L (ref 3.5–5.1)
Sodium: 140 mmol/L (ref 135–145)
Total Bilirubin: 0.3 mg/dL (ref 0.3–1.2)
Total Protein: 7.6 g/dL (ref 6.5–8.1)

## 2022-11-11 LAB — CBC WITH DIFFERENTIAL (CANCER CENTER ONLY)
Abs Immature Granulocytes: 0 10*3/uL (ref 0.00–0.07)
Basophils Absolute: 0.1 10*3/uL (ref 0.0–0.1)
Basophils Relative: 1 %
Eosinophils Absolute: 0.4 10*3/uL (ref 0.0–0.5)
Eosinophils Relative: 10 %
HCT: 35.9 % — ABNORMAL LOW (ref 36.0–46.0)
Hemoglobin: 11.2 g/dL — ABNORMAL LOW (ref 12.0–15.0)
Immature Granulocytes: 0 %
Lymphocytes Relative: 37 %
Lymphs Abs: 1.3 10*3/uL (ref 0.7–4.0)
MCH: 23.1 pg — ABNORMAL LOW (ref 26.0–34.0)
MCHC: 31.2 g/dL (ref 30.0–36.0)
MCV: 74.2 fL — ABNORMAL LOW (ref 80.0–100.0)
Monocytes Absolute: 0.3 10*3/uL (ref 0.1–1.0)
Monocytes Relative: 9 %
Neutro Abs: 1.4 10*3/uL — ABNORMAL LOW (ref 1.7–7.7)
Neutrophils Relative %: 43 %
Platelet Count: 246 10*3/uL (ref 150–400)
RBC: 4.84 MIL/uL (ref 3.87–5.11)
RDW: 17 % — ABNORMAL HIGH (ref 11.5–15.5)
WBC Count: 3.5 10*3/uL — ABNORMAL LOW (ref 4.0–10.5)
nRBC: 0 % (ref 0.0–0.2)

## 2022-11-11 LAB — TSH: TSH: 4.876 u[IU]/mL — ABNORMAL HIGH (ref 0.350–4.500)

## 2022-11-11 MED ORDER — HEPARIN SOD (PORK) LOCK FLUSH 100 UNIT/ML IV SOLN
500.0000 [IU] | Freq: Once | INTRAVENOUS | Status: AC | PRN
  Administered 2022-11-11: 500 [IU]

## 2022-11-11 MED ORDER — SODIUM CHLORIDE 0.9 % IV SOLN: Freq: Once | INTRAVENOUS | Status: AC

## 2022-11-11 MED ORDER — SODIUM CHLORIDE 0.9 % IV SOLN
400.0000 mg | Freq: Once | INTRAVENOUS | Status: AC
  Administered 2022-11-11: 400 mg via INTRAVENOUS
  Filled 2022-11-11: qty 16

## 2022-11-11 MED ORDER — CYANOCOBALAMIN 1000 MCG/ML IJ SOLN
1000.0000 ug | Freq: Once | INTRAMUSCULAR | Status: AC
  Administered 2022-11-11: 1000 ug via INTRAMUSCULAR
  Filled 2022-11-11: qty 1

## 2022-11-11 MED ORDER — SODIUM CHLORIDE 0.9% FLUSH
10.0000 mL | INTRAVENOUS | Status: DC | PRN
  Administered 2022-11-11: 10 mL

## 2022-11-11 MED ORDER — SODIUM CHLORIDE 0.9% FLUSH
10.0000 mL | Freq: Once | INTRAVENOUS | Status: AC
  Administered 2022-11-11: 10 mL

## 2022-11-11 NOTE — Assessment & Plan Note (Signed)
She has gained some weight TSH is pending I will call her with test results and adjust the dose of Synthroid as needed 

## 2022-11-11 NOTE — Assessment & Plan Note (Signed)
Recent blood work suggested B12 deficiency She will get B12 injection along with chemotherapy with every cycle 

## 2022-11-11 NOTE — Progress Notes (Signed)
[facility] OFFICE PROGRESS NOTE  Patient Care Team: Pcp, No as PCP - General  ASSESSMENT & PLAN:  Uterine cancer (HCC) She tolerated recent treatment well without major side effects except for abnormal thyroid function We will continue to monitor closely She will continue treatment every 6 weeks I plan to order repeat imaging study next month  Deficiency anemia Recent blood work suggested B12 deficiency She will get B12 injection along with chemotherapy with every cycle  Hypothyroidism She has gained some weight TSH is pending I will call her with test results and adjust the dose of Synthroid as needed  Orders Placed This Encounter  Procedures   CT ABDOMEN PELVIS W CONTRAST    Standing Status:   Future    Standing Expiration Date:   11/11/2023    Order Specific Question:   If indicated for the ordered procedure, I authorize the administration of contrast media per Radiology protocol    Answer:   Yes    Order Specific Question:   Does the patient have a contrast media/X-ray dye allergy?    Answer:   No    Order Specific Question:   Preferred imaging location?    Answer:   [facility]    Order Specific Question:   If indicated for the ordered procedure, I authorize the administration of oral contrast media per Radiology protocol    Answer:   Yes   CBC with Differential (Cancer Center Only)    Standing Status:   Future    Standing Expiration Date:   12/23/2023   CMP (Cancer Center only)    Standing Status:   Future    Standing Expiration Date:   12/23/2023   T4    Standing Status:   Future    Standing Expiration Date:   12/23/2023   TSH    Standing Status:   Future    Standing Expiration Date:   12/23/2023    All questions were answered. The patient knows to call the clinic with any problems, questions or concerns. The total time spent in the appointment was 30 minutes encounter with patients including review of chart and various tests results,  discussions about plan of care and coordination of care plan   Artis Delay, MD 11/11/2022 1:47 PM  INTERVAL HISTORY: Please see below for problem oriented charting. she returns for treatment follow-up on maintenance pembrolizumab She tolerated recent treatment well She have lost weight She has taken Synthroid consistently No new symptoms or side effects from treatment  REVIEW OF SYSTEMS:   Constitutional: Denies fevers, chills or abnormal weight loss Eyes: Denies blurriness of vision Ears, nose, mouth, throat, and face: Denies mucositis or sore throat Respiratory: Denies cough, dyspnea or wheezes Cardiovascular: Denies palpitation, chest discomfort or lower extremity swelling Gastrointestinal:  Denies nausea, heartburn or change in bowel habits Skin: Denies abnormal skin rashes Lymphatics: Denies new lymphadenopathy or easy bruising Neurological:Denies numbness, tingling or new weaknesses Behavioral/Psych: Mood is stable, no new changes  All other systems were reviewed with the patient and are negative.  I have reviewed the past medical history, past surgical history, social history and family history with the patient and they are unchanged from previous note.  ALLERGIES:  has No Known Allergies.  MEDICATIONS:  Current Outpatient Medications  Medication Sig Dispense Refill   acetaminophen (TYLENOL) 325 MG tablet Take 650 mg by mouth at bedtime as needed (pain).     levothyroxine (SYNTHROID) 150 MCG tablet TAKE 1 TABLET BY MOUTH DAILY  BEFORE BREAKFAST. 90 tablet 1   magnesium hydroxide (MILK OF MAGNESIA) 400 MG/5ML suspension Take 30 mLs by mouth daily.     polyethylene glycol (MIRALAX / GLYCOLAX) 17 g packet Take 17 g by mouth daily. (Patient taking differently: Take 17 g by mouth daily as needed for mild constipation.) 30 each 1   No current facility-administered medications for this visit.   Facility-Administered Medications Ordered in Other Visits  Medication Dose Route  Frequency Provider Last Rate Last Admin   0.9 %  sodium chloride infusion   Intravenous Once Bertis Ruddy, Mckenzi Buonomo, MD       heparin lock flush 100 unit/mL  500 Units Intracatheter Once PRN Bertis Ruddy, Rogue Rafalski, MD       pembrolizumab (KEYTRUDA) 400 mg in sodium chloride 0.9 % 50 mL chemo infusion  400 mg Intravenous Once Bertis Ruddy, Elion Hocker, MD       sodium chloride flush (NS) 0.9 % injection 10 mL  10 mL Intracatheter PRN Bertis Ruddy, Enrique Manganaro, MD        SUMMARY OF ONCOLOGIC HISTORY: Oncology History Overview Note  Endometrioid cancer, 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   Uterine cancer (HCC)  01/05/2021 Imaging   Outside CT imaging  Bilateral hydronephrosis related extrinsic compression on the ureters by the enlarged uterus.   Multiple calcified uterine fibroids although peripherally enhancing diffuse decreased attenuation is noted centrally within the uterus highly suspicious for endometrial neoplasm given the current history of vaginal bleeding. Further evaluation is recommended. MRI may be helpful. Additionally tissue sampling would likely be helpful.   Hypodensity in the pancreas at the junction of the head and body of the pancreas. This may be related to volume averaging although the possibility of a cystic lesion could not be totally excluded. MRI is recommended for further evaluation.    01/05/2021 Initial Diagnosis   The patient reported at least 1 year of postmenopausal bleeding.  She does not have a primary care provider or gynecologist.  She had known fibroids and had noted that she needed a hysterectomy but had not gotten around to seeking care for this.  She presented to [facility] emergency department on January 05, 2021 with new onset right lower quadrant pain and this prompted a CT scan of the abdomen and pelvis to be  performed on 01/05/2021.     01/14/2021 Pathology Results   FINAL MICROSCOPIC DIAGNOSIS:   A. CERVIX, ENDOCERVIX, BIOPSY:  -  Endometrioid carcinoma, FIGO grade 1  -  See comment    01/26/2021 Imaging   MRI abdomen  1. No suspicious pancreatic mass identified. 2. Moderate to severe bilateral hydroureteronephrosis, similar to recent CT the abdomen and pelvis, presumably related to extrinsic ureteral compression from the patient's enlarged uterus, as demonstrated on recent CT the abdomen and pelvis 01/05/2021.   02/01/2021 Surgery   Surgery in [facility]  Date of Service: February 01, 2021 3:13 PM  Preoperative Diagnoses: Endometrial cancer  Postoperative Diagnoses: Endometrial cancer  Procedures: Total abdominal hysterectomy, bilateral salpingo-oophorectomy, left pelvic lymphadenectomy, right para-aortic lymphadenectomy  Surgeon: Adolphus Birchwood, MD  Findings: Tumor appreciated in the cervical canal. Large, broad, multi fibroid uterus with a calcified fibroid filling the posterior cul de sac, an anterior lower uterine segment fibroid, and bilateral fibroids near the level of the cardinal ligament. No evidence of intraperitoneal disease. Normal appearing bilateral tubes  and ovaries. Bilateral hydroureter from uterine compression. Tumor excreted from specimen into the pelvis at time of removal. An approximately 2cm enlarged left pelvic lymph node and a 1cm enlarged right aorta caval lymph node were identified.     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)   B: Lymph node, aortocaval, excision - Metastatic carcinoma involving 1 lymph node (0.4 cm) (1/1)   C: Lymph node, left pelvic, excision -Metastatic carcinoma involving 1 lymph node (0.3 cm) (1/1)   Diagnosis Comment    The majority of this tumor is composed of well differentiated (overall grade 2, but with areas consistent with grade 1) endometrioid adenocarcinoma with squamous and mucinous differentiation (approximately 70%); multifocally there is abrupt transition to an undifferentiated malignancy with a high-grade monotonous noncohesive appearance (best seen in slide A2) consistent with undifferentiated carcinoma.  The overall morphologic and immunophenotypic findings are consistent with a dedifferentiated endometrial carcinoma.   Of note, the carcinoma present in the lymph nodes is well differentiated endometrioid carcinoma and the metastatic deposit in the ovary is undifferentiated carcinoma. Carcinoma is present less than 1 mm from uterine corpus serosa (in the lower uterine segment).  The assessment of the paracervical soft tissue margins is challenging due to the disrupted nature of the specimen in this area, however tumor appears present less than 1 mm from paracervical soft tissue.  No definite parametrial invasion is identified.   Immunohistochemistry performed at [facility] (with appropriate controls) demonstrates the following immunoprofile in cells of interest: Well differentiated endometrioid carcinoma: CK AE1/AE3: Positive ER: Positive (>90%, strong staining) P53: Favor wild-type (see comment below)   Dedifferentiated carcinoma: CK AE1/AE3: Very rare cells, predominantly negative ER: Negative P53: Small focus of mutant type staining in a background of wild-type staining (see comment below)   P53 staining comment: In the well differentiated carcinoma component the staining pattern is predominantly wild-type although focally there is slightly increased  levels of staining noted.  In the dedifferentiated tumor component, most of the tissue exhibits wild-type pattern staining, however there is a distinct small area that exhibits increased/overexpressed mutant pattern p53 staining.    Immunohistochemistry testing for mismatch repair (MMR) proteins is performed on block A2:   MLH1:  Loss of nuclear expression MSH2:  Intact nuclear expression MSH6:  Intact nuclear expression PMS2:  Loss of nuclear expression   Loss of nuclear expression of MLH1 and PMS2 can be a result of sporadic microsatellite instability (via hypermethylation of the MLH1 promoter region) or due to Lynch syndrome / HNPCC. Materials will be submitted to the Molecular Pathology Laboratory [phone]) for [facility] promoter hypermethylation testing. These results will be issued in a separate report.      03/05/2021 Initial Diagnosis   Uterine cancer (HCC)   03/05/2021 Cancer Staging   Staging form: Corpus Uteri - Carcinoma and Carcinosarcoma, AJCC 8th Edition - Pathologic stage from 03/05/2021: FIGO Stage IVB (rpT3, pN2a, cM1) - Signed by Artis Delay, MD on 11/12/2021 Stage prefix: Recurrence   03/11/2021 Imaging   1. Status post interval hysterectomy and oophorectomy. 2. There is a rim enhancing soft tissue mass  centered in the vagina, measuring approximately 4.4 x 3.1 cm. This is new, or significantly enlarged compared to prior examination dated 01/05/2021. Findings are concerning for malignant involvement. Correlate with physical examination. Contrast enhanced MRI may be helpful to further detail soft tissue anatomy of the low pelvis if indicated. 3. There is at least one newly enlarged left iliac lymph node measuring up to 1.4 x 1.3 cm, nonspecific although concerning for nodal metastatic disease. 4. Fluid attenuation lymphocele or seroma adjacent to surgical clip at the level right ovarian vein. 5. Status post interval midline laparotomy. Subcutaneous fluid within the inferior aspect  of the wound. The presence or absence of infection is not established by CT.     03/23/2021 - 07/09/2021 Chemotherapy   Patient is on Treatment Plan : UTERINE Carboplatin AUC 6 / Paclitaxel q21d     03/23/2021 Procedure   Placement of a subcutaneous power-injectable port device. Catheter tip at the superior cavoatrial junction   07/30/2021 Imaging   1. Large ill-defined partially enhancing and likely centrally necrotic vaginal and paravaginal mass. Recommend correlation with direct visualization. MRI pelvis without and with contrast may be helpful for further evaluation. 2. No findings suspicious for omental or peritoneal surface disease. No adenopathy.   08/09/2021 - 09/08/2021 Radiation Therapy   Indication for treatment:  Curative       Radiation treatment dates:   08/09/21 through 09/08/21 Site/dose:  IMRT Uterus; Vagina : 41.5 Gy delivered in 23 Fx with 1.80 Gy/Fx (Planned dosage was for 45.00 GY delivered in 25 Fx with 1.80 Gy/Fx, her final 2 treatments (and boost) was canceled secondary to significant radiation reaction)   Beams/energy: 6X   11/11/2021 Imaging   1. 11 mm left upper lobe pulmonary nodule in the apicoposterior segment abuts the posterior pleural margin. In this clinical context the appearance is very concerning for a metastatic lesion although we do not have prior cross-sectional imaging to assess stability. Proximity to the fifth rib and adjacent major fissure might make percutaneous biopsy tricky; PET-CT would also be one way of assessing the metabolic activity/malignant potential of this lesion. This may be a good candidate case for discussion at multidisciplinary cancer conference. 2. Increased adenopathy along the left iliac chain, including an internal iliac node which abuts the left sacral plexus. 3. The lower vaginal and vestibular mass measures about 7.0 by 5.5 by 5.9 cm and has some internal gas which may indicate ulceration along the vestibule/perineum. Poor  definition of tissue planes between this process in the base of the urinary bladder as well as the anus. The mass extends along the left anterior obturator internus muscle. 4. Other imaging findings of potential clinical significance: Prominent main pulmonary artery, cannot exclude pulmonary arterial hypertension. Aortic Atherosclerosis (ICD10-I70.0). Lower lumbar spondylosis and degenerative disc disease.   11/19/2021 - 03/11/2022 Chemotherapy   Patient is on Treatment Plan : UTERINE Pembrolizumab (200) q21d     11/19/2021 -  Chemotherapy   Patient is on Treatment Plan : UTERINE Pembrolizumab (200) q21d     01/21/2022 Imaging   1. Status post hysterectomy. Generally diminished size of an ulcerated mass involving the vagina when compared to prior examination, difficult to accurately measure due to poor resolution of adjacent soft tissues. 2. Significant interval decrease in size of left pelvic sidewall and iliac lymphadenopathy. 3. Interval enlargement of a pulmonary nodule of the posterior left upper lobe. 4. Constellation of findings is consistent with mixed response to treatment, with treatment response of primary tumor and nodal  metastatic disease but with clear evidence of an enlarging pulmonary metastasis. 5. New, diffuse urinary bladder wall thickening and adjacent fat stranding, consistent with nonspecific infectious or inflammatory cystitis, most likely related to local radiation therapy.   04/22/2022 Imaging   1. Resolution of previously noted soft tissue mass associated with the apex of the vagina. 2. Regression of previously noted left upper lobe pulmonary nodule. However, there are multiple new satellite nodules in the adjacent lung parenchyma in the upper left lung, largest of which measures 1.2 x 0.6 cm in the superior segment of the left lower lobe. Close attention on follow-up studies is recommended. 3. Interval development of mildly enlarged bilateral inguinal lymph nodes. These  could be reactive, however, the possibility of locoregional nodal metastases is not excluded, and close attention on follow-up imaging is recommended to ensure regression of these findings. 4. Aortic atherosclerosis, in addition to left anterior descending coronary artery disease. Assessment for potential risk factor modification, dietary therapy or pharmacologic therapy may be warranted, if clinically indicated.  5. Additional incidental findings, as above.   08/29/2022 Imaging   1. Previously noted clustered nodularity in the upper left lung has resolved compared to the prior study. However, today's study demonstrates a new mass-like area of architectural distortion with some internal air bronchograms and cavitation in the posterior aspect of the left upper lobe in the same region. Although this is mass-like in appearance on axial images, this is rather linear in appearance on coronal and sagittal reconstructions. If there has been radiation therapy to this region, this could simply represent an area of evolving postradiation mass-like fibrosis. In the absence of a history of radiation therapy, these findings are presumably of infectious etiology. Neoplasm is not favored, but difficult to entirely exclude. Close attention on follow-up studies is recommended to evaluate for the evolution of these findings. No other suspicious appearing pulmonary nodules or masses are noted on today's examination. 2. No definite signs of metastatic disease noted in the abdomen or pelvis. 3. Multiple prominent borderline enlarged and minimally enlarged bilateral inguinal lymph nodes, slightly less prominent than the prior examination. Continued attention on follow-up studies is recommended. 4. Severe circumferential thickening of the urinary bladder wall without discrete bladder wall mass. This is of uncertain etiology and significance, but correlation with urinalysis is recommended to exclude the possibility of cystitis.  Attention on follow-up imaging is recommended to ensure resolution. 5. Aortic atherosclerosis, in addition to left anterior descending coronary artery disease. Please note that although the presence of coronary artery calcium documents the presence of coronary artery disease, the severity of this disease and any potential stenosis cannot be assessed on this non-gated CT examination. Assessment for potential risk factor modification, dietary therapy or pharmacologic therapy may be warranted, if clinically indicated. 6. Additional incidental findings, as above.       PHYSICAL EXAMINATION: ECOG PERFORMANCE STATUS: 1 - Symptomatic but completely ambulatory  Vitals:   11/11/22 1303  BP: (!) 147/74  Pulse: 60  Resp: 18  SpO2: 100%   Filed Weights   11/11/22 1303  Weight: 220 lb 12.8 oz (100.2 kg)    GENERAL:alert, no distress and comfortable NEURO: alert & oriented x 3 with fluent speech, no focal motor/sensory deficits  LABORATORY DATA:  I have reviewed the data as listed    Component Value Date/Time   NA 140 11/11/2022 1231   K 4.0 11/11/2022 1231   CL 110 11/11/2022 1231   CO2 25 11/11/2022 1231   GLUCOSE 90 11/11/2022  1231   BUN 16 11/11/2022 1231   CREATININE 0.77 11/11/2022 1231   CALCIUM 8.9 11/11/2022 1231   PROT 7.6 11/11/2022 1231   ALBUMIN 4.1 11/11/2022 1231   AST 17 11/11/2022 1231   ALT 19 11/11/2022 1231   ALKPHOS 81 11/11/2022 1231   BILITOT 0.3 11/11/2022 1231   GFRNONAA >60 11/11/2022 1231    No results found for: "SPEP", "UPEP"  Lab Results  Component Value Date   WBC 3.5 (L) 11/11/2022   NEUTROABS 1.4 (L) 11/11/2022   HGB 11.2 (L) 11/11/2022   HCT 35.9 (L) 11/11/2022   MCV 74.2 (L) 11/11/2022   PLT 246 11/11/2022      Chemistry      Component Value Date/Time   NA 140 11/11/2022 1231   K 4.0 11/11/2022 1231   CL 110 11/11/2022 1231   CO2 25 11/11/2022 1231   BUN 16 11/11/2022 1231   CREATININE 0.77 11/11/2022 1231      Component Value  Date/Time   CALCIUM 8.9 11/11/2022 1231   ALKPHOS 81 11/11/2022 1231   AST 17 11/11/2022 1231   ALT 19 11/11/2022 1231   BILITOT 0.3 11/11/2022 1231

## 2022-11-11 NOTE — Patient Instructions (Signed)
[facility]  Discharge Instructions: Thank you for choosing [facility] to provide your oncology and hematology care.   If you have a lab appointment with the Cancer Center, please go directly to the Cancer Center and check in at the registration area.   Wear comfortable clothing and clothing appropriate for easy access to any Portacath or PICC line.   We strive to give you quality time with your provider. You may need to reschedule your appointment if you arrive late (15 or more minutes).  Arriving late affects you and other patients whose appointments are after yours.  Also, if you miss three or more appointments without notifying the office, you may be dismissed from the clinic at the provider's discretion.      For prescription refill requests, have your pharmacy contact our office and allow 72 hours for refills to be completed.    Today you received the following chemotherapy and/or immunotherapy agents: Keytruda      To help prevent nausea and vomiting after your treatment, we encourage you to take your nausea medication as directed.  BELOW ARE SYMPTOMS THAT SHOULD BE REPORTED IMMEDIATELY: *FEVER GREATER THAN 100.4 F (38 C) OR HIGHER *CHILLS OR SWEATING *NAUSEA AND VOMITING THAT IS NOT CONTROLLED WITH YOUR NAUSEA MEDICATION *UNUSUAL SHORTNESS OF BREATH *UNUSUAL BRUISING OR BLEEDING *URINARY PROBLEMS (pain or burning when urinating, or frequent urination) *BOWEL PROBLEMS (unusual diarrhea, constipation, pain near the anus) TENDERNESS IN MOUTH AND THROAT WITH OR WITHOUT PRESENCE OF ULCERS (sore throat, sores in mouth, or a toothache) UNUSUAL RASH, SWELLING OR PAIN  UNUSUAL VAGINAL DISCHARGE OR ITCHING   Items with * indicate a potential emergency and should be followed up as soon as possible or go to the Emergency Department if any problems should occur.  Please show the CHEMOTHERAPY ALERT CARD or IMMUNOTHERAPY ALERT CARD at  check-in to the Emergency Department and triage nurse.  Should you have questions after your visit or need to cancel or reschedule your appointment, please contact [facility]  Dept: [phone]  and follow the prompts.  Office hours are 8:00 a.m. to 4:30 p.m. Monday - Friday. Please note that voicemails left after 4:00 p.m. may not be returned until the following business day.  We are closed weekends and major holidays. You have access to a nurse at all times for urgent questions. Please call the main number to the clinic Dept: [phone] and follow the prompts.   For any non-urgent questions, you may also contact your provider using MyChart. We now offer e-Visits for anyone 18 and older to request care online for non-urgent symptoms. For details visit [email or web address].   Also download the MyChart app! Go to the app store, search "MyChart", open the app, select [facility], and log in with your MyChart username and password.   

## 2022-11-11 NOTE — Assessment & Plan Note (Signed)
She tolerated recent treatment well without major side effects except for abnormal thyroid function We will continue to monitor closely She will continue treatment every 6 weeks I plan to order repeat imaging study next month

## 2022-11-12 ENCOUNTER — Other Ambulatory Visit: Payer: Self-pay

## 2022-11-12 LAB — T4: T4, Total: 9.5 ug/dL (ref 4.5–12.0)

## 2022-12-09 ENCOUNTER — Ambulatory Visit: Payer: Medicare PPO | Admitting: Hematology and Oncology

## 2022-12-09 ENCOUNTER — Ambulatory Visit: Payer: Medicare PPO

## 2022-12-09 ENCOUNTER — Other Ambulatory Visit: Payer: Medicare PPO

## 2022-12-21 ENCOUNTER — Ambulatory Visit (HOSPITAL_COMMUNITY)
Admission: RE | Admit: 2022-12-21 | Discharge: 2022-12-21 | Disposition: A | Discharge status: Home / Self Care | Payer: Medicare PPO | Source: Ambulatory Visit | Attending: Hematology and Oncology | Admitting: Hematology and Oncology

## 2022-12-21 DIAGNOSIS — C539 Malignant neoplasm of cervix uteri, unspecified: Secondary | ICD-10-CM | POA: Diagnosis not present

## 2022-12-21 DIAGNOSIS — C55 Malignant neoplasm of uterus, part unspecified: Secondary | ICD-10-CM | POA: Insufficient documentation

## 2022-12-21 DIAGNOSIS — E039 Hypothyroidism, unspecified: Secondary | ICD-10-CM | POA: Diagnosis not present

## 2022-12-21 DIAGNOSIS — D539 Nutritional anemia, unspecified: Secondary | ICD-10-CM | POA: Diagnosis not present

## 2022-12-21 MED ORDER — HEPARIN SOD (PORK) LOCK FLUSH 100 UNIT/ML IV SOLN
INTRAVENOUS | Status: AC
  Filled 2022-12-21: qty 5

## 2022-12-21 MED ORDER — IOHEXOL 300 MG/ML  SOLN
100.0000 mL | Freq: Once | INTRAMUSCULAR | Status: AC | PRN
  Administered 2022-12-21: 100 mL via INTRAVENOUS

## 2022-12-21 MED ORDER — IOHEXOL 9 MG/ML PO SOLN
1000.0000 mL | Freq: Once | ORAL | Status: AC
  Administered 2022-12-21: 1000 mL via ORAL

## 2022-12-21 MED ORDER — HEPARIN SOD (PORK) LOCK FLUSH 100 UNIT/ML IV SOLN
500.0000 [IU] | Freq: Once | INTRAVENOUS | Status: AC
  Administered 2022-12-21: 500 [IU] via INTRAVENOUS

## 2022-12-23 ENCOUNTER — Inpatient Hospital Stay: Payer: Medicare PPO | Attending: Gynecologic Oncology

## 2022-12-23 ENCOUNTER — Inpatient Hospital Stay: Payer: Medicare PPO

## 2022-12-23 ENCOUNTER — Other Ambulatory Visit: Payer: Self-pay

## 2022-12-23 ENCOUNTER — Encounter: Payer: Self-pay | Admitting: Hematology and Oncology

## 2022-12-23 ENCOUNTER — Inpatient Hospital Stay (HOSPITAL_BASED_OUTPATIENT_CLINIC_OR_DEPARTMENT_OTHER): Payer: Medicare PPO | Admitting: Hematology and Oncology

## 2022-12-23 ENCOUNTER — Telehealth: Payer: Self-pay

## 2022-12-23 VITALS — BP 145/81 | HR 59 | Temp 98.1°F | Resp 18 | Ht 63.0 in | Wt 233.0 lb

## 2022-12-23 VITALS — BP 139/93 | HR 62 | Temp 98.2°F | Resp 18

## 2022-12-23 DIAGNOSIS — E039 Hypothyroidism, unspecified: Secondary | ICD-10-CM | POA: Insufficient documentation

## 2022-12-23 DIAGNOSIS — Z5112 Encounter for antineoplastic immunotherapy: Secondary | ICD-10-CM | POA: Insufficient documentation

## 2022-12-23 DIAGNOSIS — Z79899 Other long term (current) drug therapy: Secondary | ICD-10-CM | POA: Insufficient documentation

## 2022-12-23 DIAGNOSIS — Z79890 Hormone replacement therapy (postmenopausal): Secondary | ICD-10-CM | POA: Insufficient documentation

## 2022-12-23 DIAGNOSIS — D539 Nutritional anemia, unspecified: Secondary | ICD-10-CM

## 2022-12-23 DIAGNOSIS — K5909 Other constipation: Secondary | ICD-10-CM | POA: Diagnosis not present

## 2022-12-23 DIAGNOSIS — E538 Deficiency of other specified B group vitamins: Secondary | ICD-10-CM | POA: Diagnosis not present

## 2022-12-23 DIAGNOSIS — C55 Malignant neoplasm of uterus, part unspecified: Secondary | ICD-10-CM

## 2022-12-23 DIAGNOSIS — C7800 Secondary malignant neoplasm of unspecified lung: Secondary | ICD-10-CM | POA: Diagnosis not present

## 2022-12-23 LAB — CMP (CANCER CENTER ONLY)
ALT: 12 U/L (ref 0–44)
AST: 12 U/L — ABNORMAL LOW (ref 15–41)
Albumin: 3.8 g/dL (ref 3.5–5.0)
Alkaline Phosphatase: 87 U/L (ref 38–126)
Anion gap: 5 (ref 5–15)
BUN: 17 mg/dL (ref 8–23)
CO2: 26 mmol/L (ref 22–32)
Calcium: 9.4 mg/dL (ref 8.9–10.3)
Chloride: 109 mmol/L (ref 98–111)
Creatinine: 0.72 mg/dL (ref 0.44–1.00)
GFR, Estimated: >60 mL/min (ref >60)
Glucose, Bld: 86 mg/dL (ref 70–99)
Potassium: 4.3 mmol/L (ref 3.5–5.1)
Sodium: 140 mmol/L (ref 135–145)
Total Bilirubin: 0.3 mg/dL (ref 0.3–1.2)
Total Protein: 6.9 g/dL (ref 6.5–8.1)

## 2022-12-23 LAB — CBC WITH DIFFERENTIAL (CANCER CENTER ONLY)
Abs Immature Granulocytes: 0.01 10*3/uL (ref 0.00–0.07)
Basophils Absolute: 0.1 10*3/uL (ref 0.0–0.1)
Basophils Relative: 2 %
Eosinophils Absolute: 0.2 10*3/uL (ref 0.0–0.5)
Eosinophils Relative: 5 %
HCT: 35.5 % — ABNORMAL LOW (ref 36.0–46.0)
Hemoglobin: 11.5 g/dL — ABNORMAL LOW (ref 12.0–15.0)
Immature Granulocytes: 0 %
Lymphocytes Relative: 35 %
Lymphs Abs: 1.1 10*3/uL (ref 0.7–4.0)
MCH: 23.6 pg — ABNORMAL LOW (ref 26.0–34.0)
MCHC: 32.4 g/dL (ref 30.0–36.0)
MCV: 72.7 fL — ABNORMAL LOW (ref 80.0–100.0)
Monocytes Absolute: 0.3 10*3/uL (ref 0.1–1.0)
Monocytes Relative: 10 %
Neutro Abs: 1.5 10*3/uL — ABNORMAL LOW (ref 1.7–7.7)
Neutrophils Relative %: 48 %
Platelet Count: 219 10*3/uL (ref 150–400)
RBC: 4.88 MIL/uL (ref 3.87–5.11)
RDW: 17.1 % — ABNORMAL HIGH (ref 11.5–15.5)
WBC Count: 3.1 10*3/uL — ABNORMAL LOW (ref 4.0–10.5)
nRBC: 0 % (ref 0.0–0.2)

## 2022-12-23 LAB — TSH: TSH: 5.753 u[IU]/mL — ABNORMAL HIGH (ref 0.350–4.500)

## 2022-12-23 MED ORDER — LEVOTHYROXINE SODIUM 175 MCG PO TABS: 175.0000 ug | ORAL_TABLET | Freq: Every day | ORAL | 1 refills | Status: DC

## 2022-12-23 MED ORDER — SODIUM CHLORIDE 0.9 % IV SOLN
400.0000 mg | Freq: Once | INTRAVENOUS | Status: AC
  Administered 2022-12-23: 400 mg via INTRAVENOUS
  Filled 2022-12-23: qty 16

## 2022-12-23 MED ORDER — SODIUM CHLORIDE 0.9% FLUSH
10.0000 mL | INTRAVENOUS | Status: DC | PRN
  Administered 2022-12-23: 10 mL

## 2022-12-23 MED ORDER — HEPARIN SOD (PORK) LOCK FLUSH 100 UNIT/ML IV SOLN
500.0000 [IU] | Freq: Once | INTRAVENOUS | Status: AC | PRN
  Administered 2022-12-23: 500 [IU]

## 2022-12-23 MED ORDER — SODIUM CHLORIDE 0.9 % IV SOLN: Freq: Once | INTRAVENOUS | Status: AC

## 2022-12-23 MED ORDER — CYANOCOBALAMIN 1000 MCG/ML IJ SOLN
1000.0000 ug | Freq: Once | INTRAMUSCULAR | Status: AC
  Administered 2022-12-23: 1000 ug via INTRAMUSCULAR
  Filled 2022-12-23: qty 1

## 2022-12-23 MED ORDER — SODIUM CHLORIDE 0.9% FLUSH
10.0000 mL | Freq: Once | INTRAVENOUS | Status: AC
  Administered 2022-12-23: 10 mL

## 2022-12-23 NOTE — Assessment & Plan Note (Signed)
CT imaging show no evidence of active disease She tolerated treatment well except for weight gain I plan to repeat imaging study again in December She will continue immunotherapy every 6 weeks

## 2022-12-23 NOTE — Progress Notes (Signed)
[facility] OFFICE PROGRESS NOTE  Patient Care Team: Pcp, No as PCP - General  ASSESSMENT & PLAN:  Uterine cancer (HCC) CT imaging show no evidence of active disease She tolerated treatment well except for weight gain I plan to repeat imaging study again in December She will continue immunotherapy every 6 weeks  Deficiency anemia Recent blood work suggested B12 deficiency She will get B12 injection along with chemotherapy with every cycle  Other constipation She denies recent constipation I emphasized the importance of laxative therapy  Hypothyroidism She has gained some weight TSH is pending I will call her with test results and adjust the dose of Synthroid as needed  Orders Placed This Encounter  Procedures   CBC with Differential (Cancer Center Only)    Standing Status:   Future    Standing Expiration Date:   02/10/2024   CMP (Cancer Center only)    Standing Status:   Future    Standing Expiration Date:   02/10/2024   T4    Standing Status:   Future    Standing Expiration Date:   02/10/2024   TSH    Standing Status:   Future    Standing Expiration Date:   02/10/2024    All questions were answered. The patient knows to call the clinic with any problems, questions or concerns. The total time spent in the appointment was 30 minutes encounter with patients including review of chart and various tests results, discussions about plan of care and coordination of care plan   Artis Delay, MD 12/23/2022 12:24 PM  INTERVAL HISTORY: Please see below for problem oriented charting. she returns for treatment follow-up on immunotherapy She tolerated recent treatment well No side effects She is taking her thyroid medicine as prescribed Denies recent constipation  REVIEW OF SYSTEMS:   Constitutional: Denies fevers, chills or abnormal weight loss Eyes: Denies blurriness of vision Ears, nose, mouth, throat, and face: Denies mucositis or sore throat Respiratory: Denies  cough, dyspnea or wheezes Cardiovascular: Denies palpitation, chest discomfort or lower extremity swelling Gastrointestinal:  Denies nausea, heartburn or change in bowel habits Skin: Denies abnormal skin rashes Lymphatics: Denies new lymphadenopathy or easy bruising Neurological:Denies numbness, tingling or new weaknesses Behavioral/Psych: Mood is stable, no new changes  All other systems were reviewed with the patient and are negative.  I have reviewed the past medical history, past surgical history, social history and family history with the patient and they are unchanged from previous note.  ALLERGIES:  has No Known Allergies.  MEDICATIONS:  Current Outpatient Medications  Medication Sig Dispense Refill   acetaminophen (TYLENOL) 325 MG tablet Take 650 mg by mouth at bedtime as needed (pain).     levothyroxine (SYNTHROID) 150 MCG tablet TAKE 1 TABLET BY MOUTH DAILY BEFORE BREAKFAST. 90 tablet 1   magnesium hydroxide (MILK OF MAGNESIA) 400 MG/5ML suspension Take 30 mLs by mouth daily.     polyethylene glycol (MIRALAX / GLYCOLAX) 17 g packet Take 17 g by mouth daily. (Patient taking differently: Take 17 g by mouth daily as needed for mild constipation.) 30 each 1   No current facility-administered medications for this visit.   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   Uterine cancer (HCC)  01/05/2021 Imaging   Outside CT imaging  Bilateral hydronephrosis related extrinsic compression on the ureters by the enlarged uterus.   Multiple calcified uterine fibroids although peripherally enhancing diffuse decreased attenuation is noted centrally within the uterus highly suspicious for endometrial neoplasm given the current history of vaginal bleeding. Further evaluation is recommended. MRI may be helpful. Additionally tissue sampling would likely be helpful.   Hypodensity in the pancreas at the junction of the head and body of the pancreas. This may be related to volume averaging although the possibility of a cystic lesion could not be totally excluded. MRI is recommended for further evaluation.    01/05/2021 Initial Diagnosis   The patient reported at least 1 year of postmenopausal bleeding.  She does not have a primary care provider or gynecologist.  She had known fibroids and had noted that she needed a hysterectomy but had not gotten around to seeking care for this.  She presented to the [facility] emergency department on January 05, 2021 with new onset right lower quadrant pain and this prompted a CT scan of the abdomen and pelvis to be performed on 01/05/2021.     01/14/2021 Pathology Results   FINAL MICROSCOPIC DIAGNOSIS:   A. CERVIX, ENDOCERVIX, BIOPSY:  -  Endometrioid carcinoma, FIGO grade 1  -  See comment    01/26/2021 Imaging   MRI abdomen  1. No suspicious pancreatic mass identified. 2. Moderate to severe bilateral hydroureteronephrosis, similar to recent CT the abdomen and pelvis, presumably related to extrinsic ureteral compression from the patient's enlarged uterus, as demonstrated on recent CT the abdomen and pelvis 01/05/2021.   02/01/2021 Surgery   Surgery in [facility]  Date of Service: February 01, 2021 3:13 PM  Preoperative Diagnoses: Endometrial cancer  Postoperative  Diagnoses: Endometrial cancer  Procedures: Total abdominal hysterectomy, bilateral salpingo-oophorectomy, left pelvic lymphadenectomy, right para-aortic lymphadenectomy  Surgeon: Adolphus Birchwood, MD  Findings: Tumor appreciated in the cervical canal. Large, broad, multi fibroid uterus with a calcified fibroid filling the posterior cul de sac, an anterior lower uterine segment fibroid, and bilateral fibroids near the level of the cardinal ligament. No evidence of intraperitoneal disease. Normal appearing bilateral tubes and ovaries. Bilateral hydroureter from uterine compression. Tumor excreted from specimen into the pelvis at time of removal. An approximately 2cm enlarged left pelvic lymph node and a 1cm enlarged right aorta caval lymph node were identified.     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) - Right ovary, negative for tumor - Bilateral fallopian tubes, negative for tumor - Multiple leiomyomata (up to 8.5 cm)   B: Lymph node, aortocaval, excision - Metastatic carcinoma involving 1 lymph node (0.4 cm) (1/1)   C: Lymph node, left pelvic, excision -Metastatic  carcinoma involving 1 lymph node (0.3 cm) (1/1)   Diagnosis Comment    The majority of this tumor is composed of well differentiated (overall grade 2, but with areas consistent with grade 1) endometrioid adenocarcinoma with squamous and mucinous differentiation (approximately 70%); multifocally there is abrupt transition to an undifferentiated malignancy with a high-grade monotonous noncohesive appearance (best seen in slide A2) consistent with undifferentiated carcinoma.  The overall  morphologic and immunophenotypic findings are consistent with a dedifferentiated endometrial carcinoma.   Of note, the carcinoma present in the lymph nodes is well differentiated endometrioid carcinoma and the metastatic deposit in the ovary is undifferentiated carcinoma. Carcinoma is present less than 1 mm from uterine corpus serosa (in the lower uterine segment).  The assessment of the paracervical soft tissue margins is challenging due to the disrupted nature of the specimen in this area, however tumor appears present less than 1 mm from paracervical soft tissue.  No definite parametrial invasion is identified.   Immunohistochemistry performed at [facility] (with appropriate controls) demonstrates the following immunoprofile in cells of interest: Well differentiated endometrioid carcinoma: CK AE1/AE3: Positive ER: Positive (>90%, strong staining) P53: Favor wild-type (see comment below)   Dedifferentiated carcinoma: CK AE1/AE3: Very rare cells, predominantly negative ER: Negative P53: Small focus of mutant type staining in a background of wild-type staining (see comment below)   P53 staining comment: In the well differentiated carcinoma component the staining pattern is predominantly wild-type although focally there is slightly increased levels of staining noted.  In the dedifferentiated tumor component, most of the tissue exhibits wild-type pattern staining, however there is a distinct small area that exhibits increased/overexpressed mutant pattern p53 staining.    Immunohistochemistry testing for mismatch repair (MMR) proteins is performed on block A2:   MLH1:  Loss of nuclear expression MSH2:  Intact nuclear expression MSH6:  Intact nuclear expression PMS2:  Loss of nuclear expression   Loss of nuclear expression of MLH1 and PMS2 can be a result of sporadic microsatellite instability (via hypermethylation of the MLH1 promoter region) or due to Lynch syndrome / HNPCC. Materials will be  submitted to the [facility]ology Laboratory [phone]) for [facility] promoter hypermethylation testing. These results will be issued in a separate report.      03/05/2021 Initial Diagnosis   Uterine cancer (HCC)   03/05/2021 Cancer Staging   Staging form: Corpus Uteri - Carcinoma and Carcinosarcoma, AJCC 8th Edition - Pathologic stage from 03/05/2021: FIGO Stage IVB (rpT3, pN2a, cM1) - Signed by Artis Delay, MD on 11/12/2021 Stage prefix: Recurrence   03/11/2021 Imaging   1. Status post interval hysterectomy and oophorectomy. 2. There is a rim enhancing soft tissue mass centered in the vagina, measuring approximately 4.4 x 3.1 cm. This is new, or significantly enlarged compared to prior examination dated 01/05/2021. Findings are concerning for malignant involvement. Correlate with physical examination. Contrast enhanced MRI may be helpful to further detail soft tissue anatomy of the low pelvis if indicated. 3. There is at least one newly enlarged left iliac lymph node measuring up to 1.4 x 1.3 cm, nonspecific although concerning for nodal metastatic disease. 4. Fluid attenuation lymphocele or seroma adjacent to surgical clip at the level right ovarian vein. 5. Status post interval midline laparotomy. Subcutaneous fluid within the inferior aspect of the wound. The presence or absence of infection is not established by CT.     03/23/2021 - 07/09/2021 Chemotherapy   Patient is on Treatment Plan : UTERINE Carboplatin AUC 6 / Paclitaxel q21d  03/23/2021 Procedure   Placement of a subcutaneous power-injectable port device. Catheter tip at the superior cavoatrial junction   07/30/2021 Imaging   1. Large ill-defined partially enhancing and likely centrally necrotic vaginal and paravaginal mass. Recommend correlation with direct visualization. MRI pelvis without and with contrast may be helpful for further evaluation. 2. No findings suspicious for omental or peritoneal surface disease. No adenopathy.    08/09/2021 - 09/08/2021 Radiation Therapy   Indication for treatment:  Curative       Radiation treatment dates:   08/09/21 through 09/08/21 Site/dose:  IMRT Uterus; Vagina : 41.5 Gy delivered in 23 Fx with 1.80 Gy/Fx (Planned dosage was for 45.00 GY delivered in 25 Fx with 1.80 Gy/Fx, her final 2 treatments (and boost) was canceled secondary to significant radiation reaction)   Beams/energy: 6X   11/11/2021 Imaging   1. 11 mm left upper lobe pulmonary nodule in the apicoposterior segment abuts the posterior pleural margin. In this clinical context the appearance is very concerning for a metastatic lesion although we do not have prior cross-sectional imaging to assess stability. Proximity to the fifth rib and adjacent major fissure might make percutaneous biopsy tricky; PET-CT would also be one way of assessing the metabolic activity/malignant potential of this lesion. This may be a good candidate case for discussion at multidisciplinary cancer conference. 2. Increased adenopathy along the left iliac chain, including an internal iliac node which abuts the left sacral plexus. 3. The lower vaginal and vestibular mass measures about 7.0 by 5.5 by 5.9 cm and has some internal gas which may indicate ulceration along the vestibule/perineum. Poor definition of tissue planes between this process in the base of the urinary bladder as well as the anus. The mass extends along the left anterior obturator internus muscle. 4. Other imaging findings of potential clinical significance: Prominent main pulmonary artery, cannot exclude pulmonary arterial hypertension. Aortic Atherosclerosis (ICD10-I70.0). Lower lumbar spondylosis and degenerative disc disease.   11/19/2021 - 03/11/2022 Chemotherapy   Patient is on Treatment Plan : UTERINE Pembrolizumab (200) q21d     11/19/2021 -  Chemotherapy   Patient is on Treatment Plan : UTERINE Pembrolizumab (200) q21d     01/21/2022 Imaging   1. Status post hysterectomy. Generally  diminished size of an ulcerated mass involving the vagina when compared to prior examination, difficult to accurately measure due to poor resolution of adjacent soft tissues. 2. Significant interval decrease in size of left pelvic sidewall and iliac lymphadenopathy. 3. Interval enlargement of a pulmonary nodule of the posterior left upper lobe. 4. 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 5. New, diffuse urinary bladder wall thickening and adjacent fat stranding, consistent with nonspecific infectious or inflammatory cystitis, most likely related to local radiation therapy.   04/22/2022 Imaging   1. Resolution of previously noted soft tissue mass associated with the apex of the vagina. 2. Regression of previously noted left upper lobe pulmonary nodule. However, there are multiple new satellite nodules in the adjacent lung parenchyma in the upper left lung, largest of which measures 1.2 x 0.6 cm in the superior segment of the left lower lobe. Close attention on follow-up studies is recommended. 3. Interval development of mildly enlarged bilateral inguinal lymph nodes. These could be reactive, however, the possibility of locoregional nodal metastases is not excluded, and close attention on follow-up imaging is recommended to ensure regression of these  findings. 4. Aortic atherosclerosis, in addition to left anterior descending coronary artery disease. Assessment for potential risk factor modification, dietary therapy or pharmacologic therapy may be warranted, if clinically indicated.  5. Additional incidental findings, as above.   08/29/2022 Imaging   1. Previously noted clustered nodularity in the upper left lung has resolved compared to the prior study. However, today's study demonstrates a new mass-like area of architectural distortion with  some internal air bronchograms and cavitation in the posterior aspect of the left upper lobe in the same region. Although this is mass-like in appearance on axial images, this is rather linear in appearance on coronal and sagittal reconstructions. If there has been radiation therapy to this region, this could simply represent an area of evolving postradiation mass-like fibrosis. In the absence of a history of radiation therapy, these findings are presumably of infectious etiology. Neoplasm is not favored, but difficult to entirely exclude. Close attention on follow-up studies is recommended to evaluate for the evolution of these findings. No other suspicious appearing pulmonary nodules or masses are noted on today's examination. 2. No definite signs of metastatic disease noted in the abdomen or pelvis. 3. Multiple prominent borderline enlarged and minimally enlarged bilateral inguinal lymph nodes, slightly less prominent than the prior examination. Continued attention on follow-up studies is recommended. 4. Severe circumferential thickening of the urinary bladder wall without discrete bladder wall mass. This is of uncertain etiology and significance, but correlation with urinalysis is recommended to exclude the possibility of cystitis. Attention on follow-up imaging is recommended to ensure resolution. 5. Aortic atherosclerosis, in addition to left anterior descending coronary artery disease. Please note that although the presence of coronary artery calcium documents the presence of coronary artery disease, the severity of this disease and any potential stenosis cannot be assessed on this non-gated CT examination. Assessment for potential risk factor modification, dietary therapy or pharmacologic therapy may be warranted, if clinically indicated. 6. Additional incidental findings, as above.     12/23/2022 Imaging   CT ABDOMEN PELVIS W CONTRAST  Result Date: 12/22/2022 CLINICAL DATA:  Cervical cancer; *  Tracking Code: BO * EXAM: CT ABDOMEN AND PELVIS WITH CONTRAST TECHNIQUE: Multidetector CT imaging of the abdomen and pelvis was performed using the standard protocol following bolus administration of intravenous contrast. RADIATION DOSE REDUCTION: This exam was performed according to the departmental dose-optimization program which includes automated exposure control, adjustment of the mA and/or kV according to patient size and/or use of iterative reconstruction technique. CONTRAST:  OMNIPAQUE IOHEXOL 300 MG/ML  SOLN COMPARISON:  CT chest, abdomen and pelvis dated August 29, 2022 FINDINGS: Lower chest: Small hiatal hernia.  No acute abnormality. Hepatobiliary: No focal liver abnormality is seen. No gallstones, gallbladder wall thickening, or biliary dilatation. Pancreas: Relative hypoattenuation of the pancreatic head, unchanged when compared with multiple priors and likely due to focal fatty atrophy. No pancreatic ductal dilatation or surrounding inflammatory changes. Spleen: Normal in size without focal abnormality. Adrenals/Urinary Tract: Bilateral adrenal glands are unremarkable. Hydronephrosis or nephrolithiasis. Bladder is unremarkable. Stomach/Bowel: Stomach is within normal limits. Appendix appears normal. No evidence of bowel wall thickening, distention, or inflammatory changes. Vascular/Lymphatic: Aortic atherosclerosis. No enlarged abdominal or pelvic lymph nodes. Reproductive: Status post hysterectomy. No adnexal masses. Other: No abdominal wall hernia or abnormality. No abdominopelvic ascites. Musculoskeletal: No acute or significant osseous findings. IMPRESSION: 1. No evidence of metastatic disease in the abdomen or pelvis. 2. Stable prominent bilateral inguinal lymph nodes. 3. Aortic Atherosclerosis (ICD10-I70.0). Electronically Signed   By: Tacey Ruiz  Strickland M.D.   On: 12/22/2022 15:38        PHYSICAL EXAMINATION: ECOG PERFORMANCE STATUS: 1 - Symptomatic but completely  ambulatory  Vitals:   12/23/22 1109  BP: (!) 145/81  Pulse: (!) 59  Resp: 18  Temp: 98.1 F (36.7 C)  SpO2: 100%   Filed Weights   12/23/22 1109  Weight: 233 lb (105.7 kg)    GENERAL:alert, no distress and comfortable NEURO: alert & oriented x 3 with fluent speech, no focal motor/sensory deficits  LABORATORY DATA:  I have reviewed the data as listed    Component Value Date/Time   NA 140 12/23/2022 1036   K 4.3 12/23/2022 1036   CL 109 12/23/2022 1036   CO2 26 12/23/2022 1036   GLUCOSE 86 12/23/2022 1036   BUN 17 12/23/2022 1036   CREATININE 0.72 12/23/2022 1036   CALCIUM 9.4 12/23/2022 1036   PROT 6.9 12/23/2022 1036   ALBUMIN 3.8 12/23/2022 1036   AST 12 (L) 12/23/2022 1036   ALT 12 12/23/2022 1036   ALKPHOS 87 12/23/2022 1036   BILITOT 0.3 12/23/2022 1036   GFRNONAA >60 12/23/2022 1036    No results found for: "SPEP", "UPEP"  Lab Results  Component Value Date   WBC 3.1 (L) 12/23/2022   NEUTROABS 1.5 (L) 12/23/2022   HGB 11.5 (L) 12/23/2022   HCT 35.5 (L) 12/23/2022   MCV 72.7 (L) 12/23/2022   PLT 219 12/23/2022      Chemistry      Component Value Date/Time   NA 140 12/23/2022 1036   K 4.3 12/23/2022 1036   CL 109 12/23/2022 1036   CO2 26 12/23/2022 1036   BUN 17 12/23/2022 1036   CREATININE 0.72 12/23/2022 1036      Component Value Date/Time   CALCIUM 9.4 12/23/2022 1036   ALKPHOS 87 12/23/2022 1036   AST 12 (L) 12/23/2022 1036   ALT 12 12/23/2022 1036   BILITOT 0.3 12/23/2022 1036       RADIOGRAPHIC STUDIES: I have personally reviewed the radiological images as listed and agreed with the findings in the report. CT ABDOMEN PELVIS W CONTRAST  Result Date: 12/22/2022 CLINICAL DATA:  Cervical cancer; * Tracking Code: BO * EXAM: CT ABDOMEN AND PELVIS WITH CONTRAST TECHNIQUE: Multidetector CT imaging of the abdomen and pelvis was performed using the standard protocol following bolus administration of intravenous contrast. RADIATION DOSE  REDUCTION: This exam was performed according to the departmental dose-optimization program which includes automated exposure control, adjustment of the mA and/or kV according to patient size and/or use of iterative reconstruction technique. CONTRAST:  OMNIPAQUE IOHEXOL 300 MG/ML  SOLN COMPARISON:  CT chest, abdomen and pelvis dated August 29, 2022 FINDINGS: Lower chest: Small hiatal hernia.  No acute abnormality. Hepatobiliary: No focal liver abnormality is seen. No gallstones, gallbladder wall thickening, or biliary dilatation. Pancreas: Relative hypoattenuation of the pancreatic head, unchanged when compared with multiple priors and likely due to focal fatty atrophy. No pancreatic ductal dilatation or surrounding inflammatory changes. Spleen: Normal in size without focal abnormality. Adrenals/Urinary Tract: Bilateral adrenal glands are unremarkable. Hydronephrosis or nephrolithiasis. Bladder is unremarkable. Stomach/Bowel: Stomach is within normal limits. Appendix appears normal. No evidence of bowel wall thickening, distention, or inflammatory changes. Vascular/Lymphatic: Aortic atherosclerosis. No enlarged abdominal or pelvic lymph nodes. Reproductive: Status post hysterectomy. No adnexal masses. Other: No abdominal wall hernia or abnormality. No abdominopelvic ascites. Musculoskeletal: No acute or significant osseous findings. IMPRESSION: 1. No evidence of metastatic disease in the abdomen or pelvis.  2. Stable prominent bilateral inguinal lymph nodes. 3. Aortic Atherosclerosis (ICD10-I70.0). Electronically Signed   By: Allegra Lai M.D.   On: 12/22/2022 15:38

## 2022-12-23 NOTE — Patient Instructions (Signed)
[facility]  Discharge Instructions: Thank you for choosing [facility] to provide your oncology and hematology care.   If you have a lab appointment with the Cancer Center, please go directly to the Cancer Center and check in at the registration area.   Wear comfortable clothing and clothing appropriate for easy access to any Portacath or PICC line.   We strive to give you quality time with your provider. You may need to reschedule your appointment if you arrive late (15 or more minutes).  Arriving late affects you and other patients whose appointments are after yours.  Also, if you miss three or more appointments without notifying the office, you may be dismissed from the clinic at the provider's discretion.      For prescription refill requests, have your pharmacy contact our office and allow 72 hours for refills to be completed.    Today you received the following chemotherapy and/or immunotherapy agents: Keytruda, B-12     To help prevent nausea and vomiting after your treatment, we encourage you to take your nausea medication as directed.  BELOW ARE SYMPTOMS THAT SHOULD BE REPORTED IMMEDIATELY: *FEVER GREATER THAN 100.4 F (38 C) OR HIGHER *CHILLS OR SWEATING *NAUSEA AND VOMITING THAT IS NOT CONTROLLED WITH YOUR NAUSEA MEDICATION *UNUSUAL SHORTNESS OF BREATH *UNUSUAL BRUISING OR BLEEDING *URINARY PROBLEMS (pain or burning when urinating, or frequent urination) *BOWEL PROBLEMS (unusual diarrhea, constipation, pain near the anus) TENDERNESS IN MOUTH AND THROAT WITH OR WITHOUT PRESENCE OF ULCERS (sore throat, sores in mouth, or a toothache) UNUSUAL RASH, SWELLING OR PAIN  UNUSUAL VAGINAL DISCHARGE OR ITCHING   Items with * indicate a potential emergency and should be followed up as soon as possible or go to the Emergency Department if any problems should occur.  Please show the CHEMOTHERAPY ALERT CARD or IMMUNOTHERAPY ALERT CARD at  check-in to the Emergency Department and triage nurse.  Should you have questions after your visit or need to cancel or reschedule your appointment, please contact [facility]  Dept: [phone]  and follow the prompts.  Office hours are 8:00 a.m. to 4:30 p.m. Monday - Friday. Please note that voicemails left after 4:00 p.m. may not be returned until the following business day.  We are closed weekends and major holidays. You have access to a nurse at all times for urgent questions. Please call the main number to the clinic Dept: [phone] and follow the prompts.   For any non-urgent questions, you may also contact your provider using MyChart. We now offer e-Visits for anyone 76 and older to request care online for non-urgent symptoms. For details visit [email or web address].   Also download the MyChart app! Go to the app store, search "MyChart", open the app, select [place], and log in with your MyChart username and password.

## 2022-12-23 NOTE — Telephone Encounter (Signed)
Called and given below message. She verbalized understanding and appreciated the call. Rx sent to her preferred pharmacy. 

## 2022-12-23 NOTE — Assessment & Plan Note (Signed)
She has gained some weight TSH is pending I will call her with test results and adjust the dose of Synthroid as needed 

## 2022-12-23 NOTE — Assessment & Plan Note (Signed)
Recent blood work suggested B12 deficiency She will get B12 injection along with chemotherapy with every cycle 

## 2022-12-23 NOTE — Telephone Encounter (Signed)
-----   Message from Artis Delay, MD sent at 12/23/2022  1:37 PM EDT ----- Roland Rack is high Please send prescription synthroid 175 mcg to her pharmacy, 60 tabs, 1 refill

## 2022-12-23 NOTE — Assessment & Plan Note (Signed)
She denies recent constipation I emphasized the importance of laxative therapy 

## 2022-12-24 ENCOUNTER — Other Ambulatory Visit: Payer: Self-pay

## 2022-12-24 LAB — T4: T4, Total: 7.4 ug/dL (ref 4.5–12.0)

## 2022-12-26 ENCOUNTER — Other Ambulatory Visit: Payer: Self-pay

## 2023-01-01 DIAGNOSIS — B029 Zoster without complications: Secondary | ICD-10-CM | POA: Diagnosis not present

## 2023-01-09 ENCOUNTER — Other Ambulatory Visit: Payer: Self-pay

## 2023-01-09 DIAGNOSIS — B029 Zoster without complications: Secondary | ICD-10-CM | POA: Diagnosis not present

## 2023-02-10 ENCOUNTER — Inpatient Hospital Stay: Payer: Medicare PPO | Attending: Gynecologic Oncology

## 2023-02-10 ENCOUNTER — Other Ambulatory Visit: Payer: Self-pay

## 2023-02-10 ENCOUNTER — Inpatient Hospital Stay: Payer: Medicare PPO

## 2023-02-10 ENCOUNTER — Telehealth: Payer: Self-pay

## 2023-02-10 ENCOUNTER — Encounter: Payer: Self-pay | Admitting: Hematology and Oncology

## 2023-02-10 ENCOUNTER — Inpatient Hospital Stay (HOSPITAL_BASED_OUTPATIENT_CLINIC_OR_DEPARTMENT_OTHER): Payer: Medicare PPO | Admitting: Hematology and Oncology

## 2023-02-10 VITALS — BP 130/65 | HR 61 | Resp 18 | Ht 63.0 in | Wt 221.0 lb

## 2023-02-10 DIAGNOSIS — Z90710 Acquired absence of both cervix and uterus: Secondary | ICD-10-CM | POA: Diagnosis not present

## 2023-02-10 DIAGNOSIS — C55 Malignant neoplasm of uterus, part unspecified: Secondary | ICD-10-CM

## 2023-02-10 DIAGNOSIS — Z5112 Encounter for antineoplastic immunotherapy: Secondary | ICD-10-CM | POA: Diagnosis not present

## 2023-02-10 DIAGNOSIS — C7800 Secondary malignant neoplasm of unspecified lung: Secondary | ICD-10-CM | POA: Insufficient documentation

## 2023-02-10 DIAGNOSIS — Z79890 Hormone replacement therapy (postmenopausal): Secondary | ICD-10-CM | POA: Diagnosis not present

## 2023-02-10 DIAGNOSIS — D519 Vitamin B12 deficiency anemia, unspecified: Secondary | ICD-10-CM | POA: Diagnosis not present

## 2023-02-10 DIAGNOSIS — D539 Nutritional anemia, unspecified: Secondary | ICD-10-CM

## 2023-02-10 DIAGNOSIS — K5909 Other constipation: Secondary | ICD-10-CM | POA: Insufficient documentation

## 2023-02-10 DIAGNOSIS — E039 Hypothyroidism, unspecified: Secondary | ICD-10-CM | POA: Insufficient documentation

## 2023-02-10 DIAGNOSIS — Z9079 Acquired absence of other genital organ(s): Secondary | ICD-10-CM | POA: Diagnosis not present

## 2023-02-10 LAB — CBC WITH DIFFERENTIAL (CANCER CENTER ONLY)
Abs Immature Granulocytes: 0.01 10*3/uL (ref 0.00–0.07)
Basophils Absolute: 0 10*3/uL (ref 0.0–0.1)
Basophils Relative: 1 %
Eosinophils Absolute: 0.2 10*3/uL (ref 0.0–0.5)
Eosinophils Relative: 5 %
HCT: 33.6 % — ABNORMAL LOW (ref 36.0–46.0)
Hemoglobin: 10.8 g/dL — ABNORMAL LOW (ref 12.0–15.0)
Immature Granulocytes: 0 %
Lymphocytes Relative: 37 %
Lymphs Abs: 1.2 10*3/uL (ref 0.7–4.0)
MCH: 22.8 pg — ABNORMAL LOW (ref 26.0–34.0)
MCHC: 32.1 g/dL (ref 30.0–36.0)
MCV: 70.9 fL — ABNORMAL LOW (ref 80.0–100.0)
Monocytes Absolute: 0.3 10*3/uL (ref 0.1–1.0)
Monocytes Relative: 11 %
Neutro Abs: 1.5 10*3/uL — ABNORMAL LOW (ref 1.7–7.7)
Neutrophils Relative %: 46 %
Platelet Count: 248 10*3/uL (ref 150–400)
RBC: 4.74 MIL/uL (ref 3.87–5.11)
RDW: 17.8 % — ABNORMAL HIGH (ref 11.5–15.5)
WBC Count: 3.2 10*3/uL — ABNORMAL LOW (ref 4.0–10.5)
nRBC: 0 % (ref 0.0–0.2)

## 2023-02-10 LAB — CMP (CANCER CENTER ONLY)
ALT: 11 U/L (ref 0–44)
AST: 12 U/L — ABNORMAL LOW (ref 15–41)
Albumin: 3.7 g/dL (ref 3.5–5.0)
Alkaline Phosphatase: 87 U/L (ref 38–126)
Anion gap: 6 (ref 5–15)
BUN: 10 mg/dL (ref 8–23)
CO2: 26 mmol/L (ref 22–32)
Calcium: 9 mg/dL (ref 8.9–10.3)
Chloride: 109 mmol/L (ref 98–111)
Creatinine: 0.64 mg/dL (ref 0.44–1.00)
GFR, Estimated: >60 mL/min (ref >60)
Glucose, Bld: 86 mg/dL (ref 70–99)
Potassium: 4 mmol/L (ref 3.5–5.1)
Sodium: 141 mmol/L (ref 135–145)
Total Bilirubin: 0.5 mg/dL (ref 0.3–1.2)
Total Protein: 6.7 g/dL (ref 6.5–8.1)

## 2023-02-10 LAB — TSH: TSH: 0.026 u[IU]/mL — ABNORMAL LOW (ref 0.350–4.500)

## 2023-02-10 MED ORDER — LEVOTHYROXINE SODIUM 150 MCG PO TABS: 150.0000 ug | ORAL_TABLET | Freq: Every day | ORAL | 1 refills | Status: DC

## 2023-02-10 MED ORDER — SODIUM CHLORIDE 0.9 % IV SOLN
400.0000 mg | Freq: Once | INTRAVENOUS | Status: AC
  Administered 2023-02-10: 400 mg via INTRAVENOUS
  Filled 2023-02-10: qty 16

## 2023-02-10 MED ORDER — SODIUM CHLORIDE 0.9% FLUSH
10.0000 mL | Freq: Once | INTRAVENOUS | Status: AC
  Administered 2023-02-10: 10 mL

## 2023-02-10 MED ORDER — CYANOCOBALAMIN 1000 MCG/ML IJ SOLN
1000.0000 ug | Freq: Once | INTRAMUSCULAR | Status: AC
  Administered 2023-02-10: 1000 ug via INTRAMUSCULAR
  Filled 2023-02-10: qty 1

## 2023-02-10 MED ORDER — SODIUM CHLORIDE 0.9 % IV SOLN: Freq: Once | INTRAVENOUS | Status: AC

## 2023-02-10 NOTE — Progress Notes (Signed)
[facility] OFFICE PROGRESS NOTE  Patient Care Team: Pcp, No as PCP - General  ASSESSMENT & PLAN:  Uterine cancer (HCC) So far, she tolerated treatment well Her last imaging study showed no evidence of disease Clinically, she is improving and has lost a lot of weight and becoming more active We will continue pembrolizumab every 6 weeks Plan to repeat imaging study in of the year  Hypothyroidism She has gained lost weight since she become more compliance of taking her thyroid medicine TSH is pending I will call her with test results and adjust the dose of Synthroid as needed  Deficiency anemia Recent blood work suggested B12 deficiency She will get B12 injection along with chemotherapy with every cycle  Orders Placed This Encounter  Procedures   CBC with Differential (Cancer Center Only)    Standing Status:   Future    Standing Expiration Date:   03/23/2024   CMP (Cancer Center only)    Standing Status:   Future    Standing Expiration Date:   03/23/2024   T4    Standing Status:   Future    Standing Expiration Date:   03/23/2024   TSH    Standing Status:   Future    Standing Expiration Date:   03/23/2024    All questions were answered. The patient knows to call the clinic with any problems, questions or concerns. The total time spent in the appointment was 30 minutes encounter with patients including review of chart and various tests results, discussions about plan of care and coordination of care plan   Artis Delay, MD 02/10/2023 9:49 AM  INTERVAL HISTORY: Please see below for problem oriented charting. she returns for treatment follow-up on maintenance pembrolizumab She is doing well She is taking her thyroid medicine as prescribed She is also taking regular laxative for chronic constipation She denies recent infection No recent bleeding She is becoming more active and she has lost weight  REVIEW OF SYSTEMS:   Constitutional: Denies fevers, chills or  abnormal weight loss Eyes: Denies blurriness of vision Ears, nose, mouth, throat, and face: Denies mucositis or sore throat Respiratory: Denies cough, dyspnea or wheezes Cardiovascular: Denies palpitation, chest discomfort or lower extremity swelling Gastrointestinal:  Denies nausea, heartburn or change in bowel habits Skin: Denies abnormal skin rashes Lymphatics: Denies new lymphadenopathy or easy bruising Neurological:Denies numbness, tingling or new weaknesses Behavioral/Psych: Mood is stable, no new changes  All other systems were reviewed with the patient and are negative.  I have reviewed the past medical history, past surgical history, social history and family history with the patient and they are unchanged from previous note.  ALLERGIES:  has No Known Allergies.  MEDICATIONS:  Current Outpatient Medications  Medication Sig Dispense Refill   acetaminophen (TYLENOL) 325 MG tablet Take 650 mg by mouth at bedtime as needed (pain).     levothyroxine (SYNTHROID) 175 MCG tablet Take 1 tablet (175 mcg total) by mouth daily before breakfast. 90 tablet 1   magnesium hydroxide (MILK OF MAGNESIA) 400 MG/5ML suspension Take 30 mLs by mouth daily.     polyethylene glycol (MIRALAX / GLYCOLAX) 17 g packet Take 17 g by mouth daily. (Patient taking differently: Take 17 g by mouth daily as needed for mild constipation.) 30 each 1   No current facility-administered medications for this visit.    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   Uterine cancer (HCC)  01/05/2021 Imaging   Outside CT imaging  Bilateral hydronephrosis related extrinsic compression on the ureters by the enlarged uterus.   Multiple calcified uterine  fibroids although peripherally enhancing diffuse decreased attenuation is noted centrally within the uterus highly suspicious for endometrial neoplasm given the current history of vaginal bleeding. Further evaluation is recommended. MRI may be helpful. Additionally tissue sampling would likely be helpful.   Hypodensity in the pancreas at the junction of the head and body of the pancreas. This may be related to volume averaging although the possibility of a cystic lesion could not be totally excluded. MRI is recommended for further evaluation.    01/05/2021 Initial Diagnosis   The patient reported at least 1 year of postmenopausal bleeding.  She does not have a primary care provider or gynecologist.  She had known fibroids and had noted that she needed a hysterectomy but had not gotten around to seeking care for this.  She presented to the [facility] emergency department on January 05, 2021 with new onset right lower quadrant pain and this prompted a CT scan of the abdomen and pelvis to be performed on 01/05/2021.     01/14/2021 Pathology Results   FINAL MICROSCOPIC DIAGNOSIS:   A. CERVIX, ENDOCERVIX, BIOPSY:  -  Endometrioid carcinoma, FIGO grade 1  -  See comment    01/26/2021 Imaging   MRI abdomen  1. No suspicious pancreatic mass identified. 2. Moderate to severe bilateral hydroureteronephrosis, similar to recent CT the abdomen and pelvis, presumably related to extrinsic ureteral compression from the patient's enlarged uterus, as demonstrated on recent CT the abdomen and pelvis 01/05/2021.   02/01/2021 Surgery   Surgery in [facility]  Date of Service: February 01, 2021 3:13 PM  Preoperative Diagnoses: Endometrial cancer  Postoperative Diagnoses: Endometrial cancer  Procedures: Total abdominal hysterectomy, bilateral salpingo-oophorectomy, left pelvic lymphadenectomy, right para-aortic lymphadenectomy  Surgeon: Adolphus Birchwood, MD  Findings: Tumor appreciated in the cervical canal. Large, broad, multi fibroid  uterus with a calcified fibroid filling the posterior cul de sac, an anterior lower uterine segment fibroid, and bilateral fibroids near the level of the cardinal ligament. No evidence of intraperitoneal disease. Normal appearing bilateral tubes and ovaries. Bilateral hydroureter from uterine compression. Tumor excreted from specimen into the pelvis at time of removal. An approximately 2cm enlarged left pelvic lymph node and a 1cm enlarged right aorta caval lymph node were identified.     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) - Right ovary, negative for tumor - Bilateral fallopian tubes, negative for tumor - Multiple leiomyomata (up to 8.5 cm)   B: Lymph node, aortocaval, excision - Metastatic carcinoma involving 1 lymph node (0.4 cm) (1/1)   C: Lymph node, left pelvic, excision -Metastatic carcinoma involving 1 lymph node (0.3 cm) (1/1)   Diagnosis Comment    The majority of this tumor is composed of well differentiated (overall grade 2, but with areas consistent with grade 1) endometrioid adenocarcinoma with squamous and mucinous differentiation (approximately 70%); multifocally there is abrupt transition to an undifferentiated malignancy with a high-grade  monotonous noncohesive appearance (best seen in slide A2) consistent with undifferentiated carcinoma.  The overall morphologic and immunophenotypic findings are consistent with a dedifferentiated endometrial carcinoma.   Of note, the carcinoma present in the lymph nodes is well differentiated endometrioid carcinoma and the metastatic deposit in the ovary is undifferentiated carcinoma.  Carcinoma is present less than 1 mm from uterine corpus serosa (in the lower uterine segment).  The assessment of the paracervical soft tissue margins is challenging due to the disrupted nature of the specimen in this area, however tumor appears present less than 1 mm from paracervical soft tissue.  No definite parametrial invasion is identified.   Immunohistochemistry performed at [facility] (with appropriate controls) demonstrates the following immunoprofile in cells of interest: Well differentiated endometrioid carcinoma: CK AE1/AE3: Positive ER: Positive (>90%, strong staining) P53: Favor wild-type (see comment below)   Dedifferentiated carcinoma: CK AE1/AE3: Very rare cells, predominantly negative ER: Negative P53: Small focus of mutant type staining in a background of wild-type staining (see comment below)   P53 staining comment: In the well differentiated carcinoma component the staining pattern is predominantly wild-type although focally there is slightly increased levels of staining noted.  In the dedifferentiated tumor component, most of the tissue exhibits wild-type pattern staining, however there is a distinct small area that exhibits increased/overexpressed mutant pattern p53 staining.    Immunohistochemistry testing for mismatch repair (MMR) proteins is performed on block A2:   MLH1:  Loss of nuclear expression MSH2:  Intact nuclear expression MSH6:  Intact nuclear expression PMS2:  Loss of nuclear expression   Loss of nuclear expression of MLH1 and PMS2 can be a result of sporadic microsatellite instability (via hypermethylation of the MLH1 promoter region) or due to Lynch syndrome / HNPCC. Materials will be submitted to the Molecular Pathology Laboratory [phone]) for [facility] promoter hypermethylation testing. These results will be issued in a separate report.      03/05/2021 Initial Diagnosis   Uterine cancer (HCC)   03/05/2021 Cancer Staging   Staging form: Corpus Uteri -  Carcinoma and Carcinosarcoma, AJCC 8th Edition - Pathologic stage from 03/05/2021: FIGO Stage IVB (rpT3, pN2a, cM1) - Signed by Artis Delay, MD on 11/12/2021 Stage prefix: Recurrence   03/11/2021 Imaging   1. Status post interval hysterectomy and oophorectomy. 2. There is a rim enhancing soft tissue mass centered in the vagina, measuring approximately 4.4 x 3.1 cm. This is new, or significantly enlarged compared to prior examination dated 01/05/2021. Findings are concerning for malignant involvement. Correlate with physical examination. Contrast enhanced MRI may be helpful to further detail soft tissue anatomy of the low pelvis if indicated. 3. There is at least one newly enlarged left iliac lymph node measuring up to 1.4 x 1.3 cm, nonspecific although concerning for nodal metastatic disease. 4. Fluid attenuation lymphocele or seroma adjacent to surgical clip at the level right ovarian vein. 5. Status post interval midline laparotomy. Subcutaneous fluid within the inferior aspect of the wound. The presence or absence of infection is not established by CT.     03/23/2021 - 07/09/2021 Chemotherapy   Patient is on Treatment Plan : UTERINE Carboplatin AUC 6 / Paclitaxel q21d     03/23/2021 Procedure   Placement of a subcutaneous power-injectable port device. Catheter tip at the superior cavoatrial junction   07/30/2021 Imaging   1. Large ill-defined partially enhancing and likely centrally necrotic vaginal and paravaginal mass. Recommend correlation with direct visualization. MRI pelvis without and with contrast may be helpful for further evaluation. 2.  No findings suspicious for omental or peritoneal surface disease. No adenopathy.   08/09/2021 - 09/08/2021 Radiation Therapy   Indication for treatment:  Curative       Radiation treatment dates:   08/09/21 through 09/08/21 Site/dose:  IMRT Uterus; Vagina : 41.5 Gy delivered in 23 Fx with 1.80 Gy/Fx (Planned dosage was for 45.00 GY delivered in 25 Fx with  1.80 Gy/Fx, her final 2 treatments (and boost) was canceled secondary to significant radiation reaction)   Beams/energy: 6X   11/11/2021 Imaging   1. 11 mm left upper lobe pulmonary nodule in the apicoposterior segment abuts the posterior pleural margin. In this clinical context the appearance is very concerning for a metastatic lesion although we do not have prior cross-sectional imaging to assess stability. Proximity to the fifth rib and adjacent major fissure might make percutaneous biopsy tricky; PET-CT would also be one way of assessing the metabolic activity/malignant potential of this lesion. This may be a good candidate case for discussion at multidisciplinary cancer conference. 2. Increased adenopathy along the left iliac chain, including an internal iliac node which abuts the left sacral plexus. 3. The lower vaginal and vestibular mass measures about 7.0 by 5.5 by 5.9 cm and has some internal gas which may indicate ulceration along the vestibule/perineum. Poor definition of tissue planes between this process in the base of the urinary bladder as well as the anus. The mass extends along the left anterior obturator internus muscle. 4. Other imaging findings of potential clinical significance: Prominent main pulmonary artery, cannot exclude pulmonary arterial hypertension. Aortic Atherosclerosis (ICD10-I70.0). Lower lumbar spondylosis and degenerative disc disease.   11/19/2021 - 03/11/2022 Chemotherapy   Patient is on Treatment Plan : UTERINE Pembrolizumab (200) q21d     11/19/2021 -  Chemotherapy   Patient is on Treatment Plan : UTERINE Pembrolizumab (400) q42d     01/21/2022 Imaging   1. Status post hysterectomy. Generally diminished size of an ulcerated mass involving the vagina when compared to prior examination, difficult to accurately measure due to poor resolution of adjacent soft tissues. 2. Significant interval decrease in size of left pelvic sidewall and iliac lymphadenopathy. 3.  Interval enlargement of a pulmonary nodule of the posterior left upper lobe. 4. 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 5. New, diffuse urinary bladder wall thickening and adjacent fat stranding, consistent with nonspecific infectious or inflammatory cystitis, most likely related to local radiation therapy.   04/22/2022 Imaging   1. Resolution of previously noted soft tissue mass associated with the apex of the vagina. 2. Regression of previously noted left upper lobe pulmonary nodule. However, there are multiple new satellite nodules in the adjacent lung parenchyma in the upper left lung, largest of which measures 1.2 x 0.6 cm in the superior segment of the left lower lobe. Close attention on follow-up studies is recommended. 3. Interval development of mildly enlarged bilateral inguinal lymph nodes. These could be reactive, however, the possibility of locoregional nodal metastases is not excluded, and close attention on follow-up imaging is recommended to ensure regression of these findings. 4. Aortic atherosclerosis, in addition to left anterior descending coronary artery disease. Assessment for potential risk factor modification, dietary therapy or pharmacologic therapy may be warranted, if clinically indicated.  5. Additional incidental findings, as above.   08/29/2022 Imaging   1. Previously noted clustered nodularity in the upper left lung has resolved  compared to the prior study. However, today's study demonstrates a new mass-like area of architectural distortion with some internal air bronchograms and cavitation in the posterior aspect of the left upper lobe in the same region. Although this is mass-like in appearance on axial images, this is rather linear in appearance on coronal and sagittal reconstructions. If there has been  radiation therapy to this region, this could simply represent an area of evolving postradiation mass-like fibrosis. In the absence of a history of radiation therapy, these findings are presumably of infectious etiology. Neoplasm is not favored, but difficult to entirely exclude. Close attention on follow-up studies is recommended to evaluate for the evolution of these findings. No other suspicious appearing pulmonary nodules or masses are noted on today's examination. 2. No definite signs of metastatic disease noted in the abdomen or pelvis. 3. Multiple prominent borderline enlarged and minimally enlarged bilateral inguinal lymph nodes, slightly less prominent than the prior examination. Continued attention on follow-up studies is recommended. 4. Severe circumferential thickening of the urinary bladder wall without discrete bladder wall mass. This is of uncertain etiology and significance, but correlation with urinalysis is recommended to exclude the possibility of cystitis. Attention on follow-up imaging is recommended to ensure resolution. 5. Aortic atherosclerosis, in addition to left anterior descending coronary artery disease. Please note that although the presence of coronary artery calcium documents the presence of coronary artery disease, the severity of this disease and any potential stenosis cannot be assessed on this non-gated CT examination. Assessment for potential risk factor modification, dietary therapy or pharmacologic therapy may be warranted, if clinically indicated. 6. Additional incidental findings, as above.     12/23/2022 Imaging   CT ABDOMEN PELVIS W CONTRAST  Result Date: 12/22/2022 CLINICAL DATA:  Cervical cancer; * Tracking Code: BO * EXAM: CT ABDOMEN AND PELVIS WITH CONTRAST TECHNIQUE: Multidetector CT imaging of the abdomen and pelvis was performed using the standard protocol following bolus administration of intravenous contrast. RADIATION DOSE REDUCTION: This exam was  performed according to the departmental dose-optimization program which includes automated exposure control, adjustment of the mA and/or kV according to patient size and/or use of iterative reconstruction technique. CONTRAST:  OMNIPAQUE IOHEXOL 300 MG/ML  SOLN COMPARISON:  CT chest, abdomen and pelvis dated August 29, 2022 FINDINGS: Lower chest: Small hiatal hernia.  No acute abnormality. Hepatobiliary: No focal liver abnormality is seen. No gallstones, gallbladder wall thickening, or biliary dilatation. Pancreas: Relative hypoattenuation of the pancreatic head, unchanged when compared with multiple priors and likely due to focal fatty atrophy. No pancreatic ductal dilatation or surrounding inflammatory changes. Spleen: Normal in size without focal abnormality. Adrenals/Urinary Tract: Bilateral adrenal glands are unremarkable. Hydronephrosis or nephrolithiasis. Bladder is unremarkable. Stomach/Bowel: Stomach is within normal limits. Appendix appears normal. No evidence of bowel wall thickening, distention, or inflammatory changes. Vascular/Lymphatic: Aortic atherosclerosis. No enlarged abdominal or pelvic lymph nodes. Reproductive: Status post hysterectomy. No adnexal masses. Other: No abdominal wall hernia or abnormality. No abdominopelvic ascites. Musculoskeletal: No acute or significant osseous findings. IMPRESSION: 1. No evidence of metastatic disease in the abdomen or pelvis. 2. Stable prominent bilateral inguinal lymph nodes. 3. Aortic Atherosclerosis (ICD10-I70.0). Electronically Signed   By: Allegra Lai M.D.   On: 12/22/2022 15:38        PHYSICAL EXAMINATION: ECOG PERFORMANCE STATUS: 1 - Symptomatic but completely ambulatory  Vitals:   02/10/23 0932  BP: 130/65  Pulse: 61  Resp: 18  SpO2: 100%   Filed Weights   02/10/23 0932  Weight: 221  lb (100.2 kg)    GENERAL:alert, no distress and comfortable SKIN: skin color, texture, turgor are normal, no rashes or significant  lesions EYES: normal, Conjunctiva are pink and non-injected, sclera clear OROPHARYNX:no exudate, no erythema and lips, buccal mucosa, and tongue normal  NECK: supple, thyroid normal size, non-tender, without nodularity LYMPH:  no palpable lymphadenopathy in the cervical, axillary or inguinal LUNGS: clear to auscultation and percussion with normal breathing effort HEART: regular rate & rhythm and no murmurs and no lower extremity edema ABDOMEN:abdomen soft, non-tender and normal bowel sounds Musculoskeletal:no cyanosis of digits and no clubbing  NEURO: alert & oriented x 3 with fluent speech, no focal motor/sensory deficits  LABORATORY DATA:  I have reviewed the data as listed    Component Value Date/Time   NA 141 02/10/2023 0858   K 4.0 02/10/2023 0858   CL 109 02/10/2023 0858   CO2 26 02/10/2023 0858   GLUCOSE 86 02/10/2023 0858   BUN 10 02/10/2023 0858   CREATININE 0.64 02/10/2023 0858   CALCIUM 9.0 02/10/2023 0858   PROT 6.7 02/10/2023 0858   ALBUMIN 3.7 02/10/2023 0858   AST 12 (L) 02/10/2023 0858   ALT 11 02/10/2023 0858   ALKPHOS 87 02/10/2023 0858   BILITOT 0.5 02/10/2023 0858   GFRNONAA >60 02/10/2023 0858    No results found for: "SPEP", "UPEP"  Lab Results  Component Value Date   WBC 3.2 (L) 02/10/2023   NEUTROABS 1.5 (L) 02/10/2023   HGB 10.8 (L) 02/10/2023   HCT 33.6 (L) 02/10/2023   MCV 70.9 (L) 02/10/2023   PLT 248 02/10/2023      Chemistry      Component Value Date/Time   NA 141 02/10/2023 0858   K 4.0 02/10/2023 0858   CL 109 02/10/2023 0858   CO2 26 02/10/2023 0858   BUN 10 02/10/2023 0858   CREATININE 0.64 02/10/2023 0858      Component Value Date/Time   CALCIUM 9.0 02/10/2023 0858   ALKPHOS 87 02/10/2023 0858   AST 12 (L) 02/10/2023 0858   ALT 11 02/10/2023 0858   BILITOT 0.5 02/10/2023 0858

## 2023-02-10 NOTE — Telephone Encounter (Signed)
-----   Message from Artis Delay sent at 02/10/2023  2:46 PM EDT ----- TSH is now suppressed  We need to reduce synthroid to 150 mcg Call in to local pharmacy 60 pills no refills

## 2023-02-10 NOTE — Patient Instructions (Signed)
[facility]  Discharge Instructions: Thank you for choosing [facility] to provide your oncology and hematology care.   If you have a lab appointment with the Cancer Center, please go directly to the Cancer Center and check in at the registration area.   Wear comfortable clothing and clothing appropriate for easy access to any Portacath or PICC line.   We strive to give you quality time with your provider. You may need to reschedule your appointment if you arrive late (15 or more minutes).  Arriving late affects you and other patients whose appointments are after yours.  Also, if you miss three or more appointments without notifying the office, you may be dismissed from the clinic at the provider's discretion.      For prescription refill requests, have your pharmacy contact our office and allow 72 hours for refills to be completed.    Today you received the following chemotherapy and/or immunotherapy agents: Keytruda, B-12     To help prevent nausea and vomiting after your treatment, we encourage you to take your nausea medication as directed.  BELOW ARE SYMPTOMS THAT SHOULD BE REPORTED IMMEDIATELY: *FEVER GREATER THAN 100.4 F (38 C) OR HIGHER *CHILLS OR SWEATING *NAUSEA AND VOMITING THAT IS NOT CONTROLLED WITH YOUR NAUSEA MEDICATION *UNUSUAL SHORTNESS OF BREATH *UNUSUAL BRUISING OR BLEEDING *URINARY PROBLEMS (pain or burning when urinating, or frequent urination) *BOWEL PROBLEMS (unusual diarrhea, constipation, pain near the anus) TENDERNESS IN MOUTH AND THROAT WITH OR WITHOUT PRESENCE OF ULCERS (sore throat, sores in mouth, or a toothache) UNUSUAL RASH, SWELLING OR PAIN  UNUSUAL VAGINAL DISCHARGE OR ITCHING   Items with * indicate a potential emergency and should be followed up as soon as possible or go to the Emergency Department if any problems should occur.  Please show the CHEMOTHERAPY ALERT CARD or IMMUNOTHERAPY ALERT CARD at  check-in to the Emergency Department and triage nurse.  Should you have questions after your visit or need to cancel or reschedule your appointment, please contact [facility]  Dept: [phone]  and follow the prompts.  Office hours are 8:00 a.m. to 4:30 p.m. Monday - Friday. Please note that voicemails left after 4:00 p.m. may not be returned until the following business day.  We are closed weekends and major holidays. You have access to a nurse at all times for urgent questions. Please call the main number to the clinic Dept: [phone] and follow the prompts.   For any non-urgent questions, you may also contact your provider using MyChart. We now offer e-Visits for anyone 76 and older to request care online for non-urgent symptoms. For details visit [email or web address].   Also download the MyChart app! Go to the app store, search "MyChart", open the app, select [place], and log in with your MyChart username and password.

## 2023-02-10 NOTE — Assessment & Plan Note (Signed)
So far, she tolerated treatment well Her last imaging study showed no evidence of disease Clinically, she is improving and has lost a lot of weight and becoming more active We will continue pembrolizumab every 6 weeks Plan to repeat imaging study in of the year

## 2023-02-10 NOTE — Assessment & Plan Note (Signed)
Recent blood work suggested B12 deficiency She will get B12 injection along with chemotherapy with every cycle 

## 2023-02-10 NOTE — Telephone Encounter (Signed)
Called and left below message. Rx sent to her local pharmacy. Ask her to call the office back for questions.

## 2023-02-10 NOTE — Assessment & Plan Note (Signed)
She has gained lost weight since she become more compliance of taking her thyroid medicine TSH is pending I will call her with test results and adjust the dose of Synthroid as needed

## 2023-02-11 ENCOUNTER — Other Ambulatory Visit: Payer: Self-pay

## 2023-02-17 ENCOUNTER — Other Ambulatory Visit: Payer: Self-pay

## 2023-02-23 ENCOUNTER — Encounter: Payer: Self-pay | Admitting: Hematology and Oncology

## 2023-03-03 ENCOUNTER — Other Ambulatory Visit: Payer: Self-pay

## 2023-03-24 ENCOUNTER — Inpatient Hospital Stay: Payer: Medicare PPO

## 2023-03-24 ENCOUNTER — Inpatient Hospital Stay (HOSPITAL_BASED_OUTPATIENT_CLINIC_OR_DEPARTMENT_OTHER): Payer: Medicare PPO | Admitting: Hematology and Oncology

## 2023-03-24 ENCOUNTER — Encounter: Payer: Self-pay | Admitting: Hematology and Oncology

## 2023-03-24 ENCOUNTER — Inpatient Hospital Stay: Payer: Medicare PPO | Attending: Gynecologic Oncology

## 2023-03-24 VITALS — BP 132/89 | HR 59 | Temp 98.0°F | Resp 18 | Ht 63.0 in | Wt 229.4 lb

## 2023-03-24 DIAGNOSIS — C55 Malignant neoplasm of uterus, part unspecified: Secondary | ICD-10-CM | POA: Diagnosis not present

## 2023-03-24 DIAGNOSIS — E039 Hypothyroidism, unspecified: Secondary | ICD-10-CM | POA: Diagnosis not present

## 2023-03-24 DIAGNOSIS — E538 Deficiency of other specified B group vitamins: Secondary | ICD-10-CM | POA: Diagnosis not present

## 2023-03-24 DIAGNOSIS — Z79899 Other long term (current) drug therapy: Secondary | ICD-10-CM | POA: Diagnosis not present

## 2023-03-24 DIAGNOSIS — C7800 Secondary malignant neoplasm of unspecified lung: Secondary | ICD-10-CM | POA: Insufficient documentation

## 2023-03-24 DIAGNOSIS — Z5112 Encounter for antineoplastic immunotherapy: Secondary | ICD-10-CM | POA: Insufficient documentation

## 2023-03-24 LAB — CMP (CANCER CENTER ONLY)
ALT: 13 U/L (ref 0–44)
AST: 13 U/L — ABNORMAL LOW (ref 15–41)
Albumin: 3.9 g/dL (ref 3.5–5.0)
Alkaline Phosphatase: 100 U/L (ref 38–126)
Anion gap: 4 — ABNORMAL LOW (ref 5–15)
BUN: 19 mg/dL (ref 8–23)
CO2: 27 mmol/L (ref 22–32)
Calcium: 9.4 mg/dL (ref 8.9–10.3)
Chloride: 108 mmol/L (ref 98–111)
Creatinine: 0.82 mg/dL (ref 0.44–1.00)
GFR, Estimated: >60 mL/min (ref >60)
Glucose, Bld: 89 mg/dL (ref 70–99)
Potassium: 4.3 mmol/L (ref 3.5–5.1)
Sodium: 139 mmol/L (ref 135–145)
Total Bilirubin: 0.4 mg/dL (ref 0.3–1.2)
Total Protein: 7.2 g/dL (ref 6.5–8.1)

## 2023-03-24 LAB — CBC WITH DIFFERENTIAL (CANCER CENTER ONLY)
Abs Immature Granulocytes: 0 10*3/uL (ref 0.00–0.07)
Basophils Absolute: 0 10*3/uL (ref 0.0–0.1)
Basophils Relative: 1 %
Eosinophils Absolute: 0.1 10*3/uL (ref 0.0–0.5)
Eosinophils Relative: 4 %
HCT: 35.8 % — ABNORMAL LOW (ref 36.0–46.0)
Hemoglobin: 11.4 g/dL — ABNORMAL LOW (ref 12.0–15.0)
Immature Granulocytes: 0 %
Lymphocytes Relative: 38 %
Lymphs Abs: 1.3 10*3/uL (ref 0.7–4.0)
MCH: 23.1 pg — ABNORMAL LOW (ref 26.0–34.0)
MCHC: 31.8 g/dL (ref 30.0–36.0)
MCV: 72.5 fL — ABNORMAL LOW (ref 80.0–100.0)
Monocytes Absolute: 0.3 10*3/uL (ref 0.1–1.0)
Monocytes Relative: 10 %
Neutro Abs: 1.6 10*3/uL — ABNORMAL LOW (ref 1.7–7.7)
Neutrophils Relative %: 47 %
Platelet Count: 241 10*3/uL (ref 150–400)
RBC: 4.94 MIL/uL (ref 3.87–5.11)
RDW: 17.7 % — ABNORMAL HIGH (ref 11.5–15.5)
WBC Count: 3.4 10*3/uL — ABNORMAL LOW (ref 4.0–10.5)
nRBC: 0 % (ref 0.0–0.2)

## 2023-03-24 LAB — TSH: TSH: 2.297 u[IU]/mL (ref 0.350–4.500)

## 2023-03-24 MED ORDER — SODIUM CHLORIDE 0.9 % IV SOLN: Freq: Once | INTRAVENOUS | Status: AC

## 2023-03-24 MED ORDER — SODIUM CHLORIDE 0.9% FLUSH
10.0000 mL | INTRAVENOUS | Status: DC | PRN
  Administered 2023-03-24: 10 mL

## 2023-03-24 MED ORDER — CYANOCOBALAMIN 1000 MCG/ML IJ SOLN
1000.0000 ug | Freq: Once | INTRAMUSCULAR | Status: AC
  Administered 2023-03-24: 1000 ug via INTRAMUSCULAR
  Filled 2023-03-24: qty 1

## 2023-03-24 MED ORDER — HEPARIN SOD (PORK) LOCK FLUSH 100 UNIT/ML IV SOLN
500.0000 [IU] | Freq: Once | INTRAVENOUS | Status: AC | PRN
  Administered 2023-03-24: 500 [IU]

## 2023-03-24 MED ORDER — SODIUM CHLORIDE 0.9 % IV SOLN
400.0000 mg | Freq: Once | INTRAVENOUS | Status: AC
  Administered 2023-03-24: 400 mg via INTRAVENOUS
  Filled 2023-03-24: qty 16

## 2023-03-24 NOTE — Assessment & Plan Note (Signed)
She has been getting vitamin B12 injection consistently with improvement of her blood count She will continue the same I plan to recheck vitamin B12 in her next visit

## 2023-03-24 NOTE — Patient Instructions (Signed)
[facility]  Discharge Instructions: Thank you for choosing [facility] to provide your oncology and hematology care.   If you have a lab appointment with the Cancer Center, please go directly to the Cancer Center and check in at the registration area.   Wear comfortable clothing and clothing appropriate for easy access to any Portacath or PICC line.   We strive to give you quality time with your provider. You may need to reschedule your appointment if you arrive late (15 or more minutes).  Arriving late affects you and other patients whose appointments are after yours.  Also, if you miss three or more appointments without notifying the office, you may be dismissed from the clinic at the provider's discretion.      For prescription refill requests, have your pharmacy contact our office and allow 72 hours for refills to be completed.    Today you received the following chemotherapy and/or immunotherapy agents: pembrolizumab      To help prevent nausea and vomiting after your treatment, we encourage you to take your nausea medication as directed.  BELOW ARE SYMPTOMS THAT SHOULD BE REPORTED IMMEDIATELY: *FEVER GREATER THAN 100.4 F (38 C) OR HIGHER *CHILLS OR SWEATING *NAUSEA AND VOMITING THAT IS NOT CONTROLLED WITH YOUR NAUSEA MEDICATION *UNUSUAL SHORTNESS OF BREATH *UNUSUAL BRUISING OR BLEEDING *URINARY PROBLEMS (pain or burning when urinating, or frequent urination) *BOWEL PROBLEMS (unusual diarrhea, constipation, pain near the anus) TENDERNESS IN MOUTH AND THROAT WITH OR WITHOUT PRESENCE OF ULCERS (sore throat, sores in mouth, or a toothache) UNUSUAL RASH, SWELLING OR PAIN  UNUSUAL VAGINAL DISCHARGE OR ITCHING   Items with * indicate a potential emergency and should be followed up as soon as possible or go to the Emergency Department if any problems should occur.  Please show the CHEMOTHERAPY ALERT CARD or IMMUNOTHERAPY ALERT CARD at  check-in to the Emergency Department and triage nurse.  Should you have questions after your visit or need to cancel or reschedule your appointment, please contact [facility]  Dept: [phone]  and follow the prompts.  Office hours are 8:00 a.m. to 4:30 p.m. Monday - Friday. Please note that voicemails left after 4:00 p.m. may not be returned until the following business day.  We are closed weekends and major holidays. You have access to a nurse at all times for urgent questions. Please call the main number to the clinic Dept: [phone] and follow the prompts.   For any non-urgent questions, you may also contact your provider using MyChart. We now offer e-Visits for anyone 54 and older to request care online for non-urgent symptoms. For details visit [email or web address].   Also download the MyChart app! Go to the app store, search "MyChart", open the app, select [place], and log in with your MyChart username and password.

## 2023-03-24 NOTE — Progress Notes (Signed)
[facility] OFFICE PROGRESS NOTE  Patient Care Team: Pcp, No as PCP - General  ASSESSMENT & PLAN:  Uterine cancer (HCC) So far, she tolerated treatment well Her last imaging study showed no evidence of disease She has gained a lot of weight due to eating a lot of longer wrist food but is motivated to lose some weight again with exercise We will continue pembrolizumab every 6 weeks Plan to repeat imaging study in of the year  Hypothyroidism She has gained a lot of weight recently She stated she is compliant taking her Synthroid TSH is pending I will call her with test results and adjust the dose of Synthroid as needed  Vitamin B12 deficiency She has been getting vitamin B12 injection consistently with improvement of her blood count She will continue the same I plan to recheck vitamin B12 in her next visit  Orders Placed This Encounter  Procedures   CBC with Differential (Cancer Center Only)    Standing Status:   Future    Standing Expiration Date:   05/04/2024   CMP (Cancer Center only)    Standing Status:   Future    Standing Expiration Date:   05/04/2024   T4    Standing Status:   Future    Standing Expiration Date:   05/04/2024   TSH    Standing Status:   Future    Standing Expiration Date:   05/04/2024   Vitamin B12    Standing Status:   Future    Standing Expiration Date:   03/23/2024    All questions were answered. The patient knows to call the clinic with any problems, questions or concerns. The total time spent in the appointment was 25 minutes encounter with patients including review of chart and various tests results, discussions about plan of care and coordination of care plan   Artis Delay, MD 03/24/2023 9:20 AM  INTERVAL HISTORY: Please see below for problem oriented charting. she returns for treatment follow-up on maintenance pembrolizumab She is compliant taking her medications Due to her recent birthday, she ate a lot and has gained a  lot of weight She is motivated to start exercising No side effects from treatment so far  REVIEW OF SYSTEMS:   Constitutional: Denies fevers, chills or abnormal weight loss Eyes: Denies blurriness of vision Ears, nose, mouth, throat, and face: Denies mucositis or sore throat Respiratory: Denies cough, dyspnea or wheezes Cardiovascular: Denies palpitation, chest discomfort or lower extremity swelling Gastrointestinal:  Denies nausea, heartburn or change in bowel habits Skin: Denies abnormal skin rashes Lymphatics: Denies new lymphadenopathy or easy bruising Neurological:Denies numbness, tingling or new weaknesses Behavioral/Psych: Mood is stable, no new changes  All other systems were reviewed with the patient and are negative.  I have reviewed the past medical history, past surgical history, social history and family history with the patient and they are unchanged from previous note.  ALLERGIES:  has No Known Allergies.  MEDICATIONS:  Current Outpatient Medications  Medication Sig Dispense Refill   acetaminophen (TYLENOL) 325 MG tablet Take 650 mg by mouth at bedtime as needed (pain).     levothyroxine (SYNTHROID) 150 MCG tablet Take 1 tablet (150 mcg total) by mouth daily before breakfast. 30 tablet 1   magnesium hydroxide (MILK OF MAGNESIA) 400 MG/5ML suspension Take 30 mLs by mouth daily.     polyethylene glycol (MIRALAX / GLYCOLAX) 17 g packet Take 17 g by mouth daily. (Patient taking differently: Take 17 g by mouth daily as  needed for mild constipation.) 30 each 1   No current facility-administered medications for this visit.    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   Uterine cancer (HCC)  01/05/2021  Imaging   Outside CT imaging  Bilateral hydronephrosis related extrinsic compression on the ureters by the enlarged uterus.   Multiple calcified uterine fibroids although peripherally enhancing diffuse decreased attenuation is noted centrally within the uterus highly suspicious for endometrial neoplasm given the current history of vaginal bleeding. Further evaluation is recommended. MRI may be helpful. Additionally tissue sampling would likely be helpful.   Hypodensity in the pancreas at the junction of the head and body of the pancreas. This may be related to volume averaging although the possibility of a cystic lesion could not be totally excluded. MRI is recommended for further evaluation.    01/05/2021 Initial Diagnosis   The patient reported at least 1 year of postmenopausal bleeding.  She does not have a primary care provider or gynecologist.  She had known fibroids and had noted that she needed a hysterectomy but had not gotten around to seeking care for this.  She presented to the [facility] emergency department on January 05, 2021 with new onset right lower quadrant pain and this prompted a CT scan of the abdomen and pelvis to be performed on 01/05/2021.     01/14/2021 Pathology Results   FINAL MICROSCOPIC DIAGNOSIS:   A. CERVIX, ENDOCERVIX, BIOPSY:  -  Endometrioid carcinoma, FIGO grade 1  -  See comment    01/26/2021 Imaging   MRI abdomen  1. No suspicious pancreatic mass identified. 2. Moderate to severe bilateral hydroureteronephrosis, similar to recent CT the abdomen and pelvis, presumably related to extrinsic ureteral compression from the patient's enlarged uterus, as demonstrated on recent CT the abdomen and pelvis 01/05/2021.   02/01/2021 Surgery   Surgery in [facility]  Date of Service: February 01, 2021 3:13 PM  Preoperative Diagnoses: Endometrial cancer  Postoperative Diagnoses: Endometrial cancer  Procedures: Total abdominal hysterectomy, bilateral salpingo-oophorectomy, left pelvic  lymphadenectomy, right para-aortic lymphadenectomy  Surgeon: Adolphus Birchwood, MD  Findings: Tumor appreciated in the cervical canal. Large, broad, multi fibroid uterus with a calcified fibroid filling the posterior cul de sac, an anterior lower uterine segment fibroid, and bilateral fibroids near the level of the cardinal ligament. No evidence of intraperitoneal disease. Normal appearing bilateral tubes and ovaries. Bilateral hydroureter from uterine compression. Tumor excreted from specimen into the pelvis at time of removal. An approximately 2cm enlarged left pelvic lymph node and a 1cm enlarged right aorta caval lymph node were identified.     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) - Right ovary, negative for tumor - Bilateral fallopian tubes, negative for tumor - Multiple leiomyomata (up to 8.5 cm)   B: Lymph node, aortocaval, excision - Metastatic carcinoma involving 1 lymph node (0.4 cm) (1/1)   C: Lymph node, left pelvic, excision -Metastatic carcinoma involving 1 lymph node (0.3 cm) (1/1)   Diagnosis Comment  The majority of this tumor is composed of well differentiated (overall grade 2, but with areas consistent with grade 1) endometrioid adenocarcinoma with squamous and mucinous differentiation (approximately 70%); multifocally there is abrupt transition to an undifferentiated malignancy with a high-grade monotonous noncohesive appearance (best seen in slide A2) consistent with undifferentiated carcinoma.  The overall morphologic and immunophenotypic findings are consistent with a dedifferentiated endometrial carcinoma.   Of note,  the carcinoma present in the lymph nodes is well differentiated endometrioid carcinoma and the metastatic deposit in the ovary is undifferentiated carcinoma. Carcinoma is present less than 1 mm from uterine corpus serosa (in the lower uterine segment).  The assessment of the paracervical soft tissue margins is challenging due to the disrupted nature of the specimen in this area, however tumor appears present less than 1 mm from paracervical soft tissue.  No definite parametrial invasion is identified.   Immunohistochemistry performed at [facility] (with appropriate controls) demonstrates the following immunoprofile in cells of interest: Well differentiated endometrioid carcinoma: CK AE1/AE3: Positive ER: Positive (>90%, strong staining) P53: Favor wild-type (see comment below)   Dedifferentiated carcinoma: CK AE1/AE3: Very rare cells, predominantly negative ER: Negative P53: Small focus of mutant type staining in a background of wild-type staining (see comment below)   P53 staining comment: In the well differentiated carcinoma component the staining pattern is predominantly wild-type although focally there is slightly increased levels of staining noted.  In the dedifferentiated tumor component, most of the tissue exhibits wild-type pattern staining, however there is a distinct small area that exhibits increased/overexpressed mutant pattern p53 staining.    Immunohistochemistry testing for mismatch repair (MMR) proteins is performed on block A2:   MLH1:  Loss of nuclear expression MSH2:  Intact nuclear expression MSH6:  Intact nuclear expression PMS2:  Loss of nuclear expression   Loss of nuclear expression of MLH1 and PMS2 can be a result of sporadic microsatellite instability (via hypermethylation of the MLH1 promoter region) or due to Lynch syndrome / HNPCC. Materials will be submitted to the Molecular Pathology Laboratory [phone]) for [facility] promoter hypermethylation testing. These results will  be issued in a separate report.      03/05/2021 Initial Diagnosis   Uterine cancer (HCC)   03/05/2021 Cancer Staging   Staging form: Corpus Uteri - Carcinoma and Carcinosarcoma, AJCC 8th Edition - Pathologic stage from 03/05/2021: FIGO Stage IVB (rpT3, pN2a, cM1) - Signed by Artis Delay, MD on 11/12/2021 Stage prefix: Recurrence   03/11/2021 Imaging   1. Status post interval hysterectomy and oophorectomy. 2. There is a rim enhancing soft tissue mass centered in the vagina, measuring approximately 4.4 x 3.1 cm. This is new, or significantly enlarged compared to prior examination dated 01/05/2021. Findings are concerning for malignant involvement. Correlate with physical examination. Contrast enhanced MRI may be helpful to further detail soft tissue anatomy of the low pelvis if indicated. 3. There is at least one newly enlarged left iliac lymph node measuring up to 1.4 x 1.3 cm, nonspecific although concerning for nodal metastatic disease. 4. Fluid attenuation lymphocele or seroma adjacent to surgical clip at the level right ovarian vein. 5. Status post interval midline laparotomy. Subcutaneous fluid within the inferior aspect of the wound. The presence or absence of infection is not established by CT.     03/23/2021 - 07/09/2021 Chemotherapy   Patient is on Treatment Plan : UTERINE Carboplatin AUC 6 / Paclitaxel q21d     03/23/2021 Procedure   Placement of a subcutaneous power-injectable port device. Catheter tip at  the superior cavoatrial junction   07/30/2021 Imaging   1. Large ill-defined partially enhancing and likely centrally necrotic vaginal and paravaginal mass. Recommend correlation with direct visualization. MRI pelvis without and with contrast may be helpful for further evaluation. 2. No findings suspicious for omental or peritoneal surface disease. No adenopathy.   08/09/2021 - 09/08/2021 Radiation Therapy   Indication for treatment:  Curative       Radiation treatment dates:   08/09/21  through 09/08/21 Site/dose:  IMRT Uterus; Vagina : 41.5 Gy delivered in 23 Fx with 1.80 Gy/Fx (Planned dosage was for 45.00 GY delivered in 25 Fx with 1.80 Gy/Fx, her final 2 treatments (and boost) was canceled secondary to significant radiation reaction)   Beams/energy: 6X   11/11/2021 Imaging   1. 11 mm left upper lobe pulmonary nodule in the apicoposterior segment abuts the posterior pleural margin. In this clinical context the appearance is very concerning for a metastatic lesion although we do not have prior cross-sectional imaging to assess stability. Proximity to the fifth rib and adjacent major fissure might make percutaneous biopsy tricky; PET-CT would also be one way of assessing the metabolic activity/malignant potential of this lesion. This may be a good candidate case for discussion at multidisciplinary cancer conference. 2. Increased adenopathy along the left iliac chain, including an internal iliac node which abuts the left sacral plexus. 3. The lower vaginal and vestibular mass measures about 7.0 by 5.5 by 5.9 cm and has some internal gas which may indicate ulceration along the vestibule/perineum. Poor definition of tissue planes between this process in the base of the urinary bladder as well as the anus. The mass extends along the left anterior obturator internus muscle. 4. Other imaging findings of potential clinical significance: Prominent main pulmonary artery, cannot exclude pulmonary arterial hypertension. Aortic Atherosclerosis (ICD10-I70.0). Lower lumbar spondylosis and degenerative disc disease.   11/19/2021 - 03/11/2022 Chemotherapy   Patient is on Treatment Plan : UTERINE Pembrolizumab (200) q21d     11/19/2021 -  Chemotherapy   Patient is on Treatment Plan : UTERINE Pembrolizumab (400) q42d     01/21/2022 Imaging   1. Status post hysterectomy. Generally diminished size of an ulcerated mass involving the vagina when compared to prior examination, difficult to accurately  measure due to poor resolution of adjacent soft tissues. 2. Significant interval decrease in size of left pelvic sidewall and iliac lymphadenopathy. 3. Interval enlargement of a pulmonary nodule of the posterior left upper lobe. 4. 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 5. New, diffuse urinary bladder wall thickening and adjacent fat stranding, consistent with nonspecific infectious or inflammatory cystitis, most likely related to local radiation therapy.   04/22/2022 Imaging   1. Resolution of previously noted soft tissue mass associated with the apex of the vagina. 2. Regression of previously noted left upper lobe pulmonary nodule. However, there are multiple new satellite nodules in the adjacent lung parenchyma in the upper left lung, largest of which measures 1.2 x 0.6 cm in the superior segment of the left lower lobe. Close attention on follow-up studies is recommended. 3. Interval development of mildly enlarged bilateral inguinal lymph nodes. These could be reactive, however, the possibility of locoregional nodal metastases is not excluded, and close attention on follow-up imaging is recommended to ensure regression of these findings. 4. Aortic atherosclerosis, in addition to left anterior descending coronary artery disease. Assessment  for potential risk factor modification, dietary therapy or pharmacologic therapy may be warranted, if clinically indicated.  5. Additional incidental findings, as above.   08/29/2022 Imaging   1. Previously noted clustered nodularity in the upper left lung has resolved compared to the prior study. However, today's study demonstrates a new mass-like area of architectural distortion with some internal air bronchograms and cavitation in the posterior aspect of the left upper lobe in the same region.  Although this is mass-like in appearance on axial images, this is rather linear in appearance on coronal and sagittal reconstructions. If there has been radiation therapy to this region, this could simply represent an area of evolving postradiation mass-like fibrosis. In the absence of a history of radiation therapy, these findings are presumably of infectious etiology. Neoplasm is not favored, but difficult to entirely exclude. Close attention on follow-up studies is recommended to evaluate for the evolution of these findings. No other suspicious appearing pulmonary nodules or masses are noted on today's examination. 2. No definite signs of metastatic disease noted in the abdomen or pelvis. 3. Multiple prominent borderline enlarged and minimally enlarged bilateral inguinal lymph nodes, slightly less prominent than the prior examination. Continued attention on follow-up studies is recommended. 4. Severe circumferential thickening of the urinary bladder wall without discrete bladder wall mass. This is of uncertain etiology and significance, but correlation with urinalysis is recommended to exclude the possibility of cystitis. Attention on follow-up imaging is recommended to ensure resolution. 5. Aortic atherosclerosis, in addition to left anterior descending coronary artery disease. Please note that although the presence of coronary artery calcium documents the presence of coronary artery disease, the severity of this disease and any potential stenosis cannot be assessed on this non-gated CT examination. Assessment for potential risk factor modification, dietary therapy or pharmacologic therapy may be warranted, if clinically indicated. 6. Additional incidental findings, as above.     12/23/2022 Imaging   CT ABDOMEN PELVIS W CONTRAST  Result Date: 12/22/2022 CLINICAL DATA:  Cervical cancer; * Tracking Code: BO * EXAM: CT ABDOMEN AND PELVIS WITH CONTRAST TECHNIQUE: Multidetector CT imaging of the abdomen and  pelvis was performed using the standard protocol following bolus administration of intravenous contrast. RADIATION DOSE REDUCTION: This exam was performed according to the departmental dose-optimization program which includes automated exposure control, adjustment of the mA and/or kV according to patient size and/or use of iterative reconstruction technique. CONTRAST:  OMNIPAQUE IOHEXOL 300 MG/ML  SOLN COMPARISON:  CT chest, abdomen and pelvis dated August 29, 2022 FINDINGS: Lower chest: Small hiatal hernia.  No acute abnormality. Hepatobiliary: No focal liver abnormality is seen. No gallstones, gallbladder wall thickening, or biliary dilatation. Pancreas: Relative hypoattenuation of the pancreatic head, unchanged when compared with multiple priors and likely due to focal fatty atrophy. No pancreatic ductal dilatation or surrounding inflammatory changes. Spleen: Normal in size without focal abnormality. Adrenals/Urinary Tract: Bilateral adrenal glands are unremarkable. Hydronephrosis or nephrolithiasis. Bladder is unremarkable. Stomach/Bowel: Stomach is within normal limits. Appendix appears normal. No evidence of bowel wall thickening, distention, or inflammatory changes. Vascular/Lymphatic: Aortic atherosclerosis. No enlarged abdominal or pelvic lymph nodes. Reproductive: Status post hysterectomy. No adnexal masses. Other: No abdominal wall hernia or abnormality. No abdominopelvic ascites. Musculoskeletal: No acute or significant osseous findings. IMPRESSION: 1. No evidence of metastatic disease in the abdomen or pelvis. 2. Stable prominent bilateral inguinal lymph nodes. 3. Aortic Atherosclerosis (ICD10-I70.0). Electronically Signed   By: Allegra Lai M.D.   On: 12/22/2022 15:38  PHYSICAL EXAMINATION: ECOG PERFORMANCE STATUS: 0 - Asymptomatic  Vitals:   03/24/23 0904  BP: 132/89  Pulse: (!) 59  Resp: 18  Temp: 98 F (36.7 C)  SpO2: 100%   Filed Weights   03/24/23 0904  Weight:  229 lb 6.4 oz (104.1 kg)    GENERAL:alert, no distress and comfortable  LABORATORY DATA:  I have reviewed the data as listed    Component Value Date/Time   NA 141 02/10/2023 0858   K 4.0 02/10/2023 0858   CL 109 02/10/2023 0858   CO2 26 02/10/2023 0858   GLUCOSE 86 02/10/2023 0858   BUN 10 02/10/2023 0858   CREATININE 0.64 02/10/2023 0858   CALCIUM 9.0 02/10/2023 0858   PROT 6.7 02/10/2023 0858   ALBUMIN 3.7 02/10/2023 0858   AST 12 (L) 02/10/2023 0858   ALT 11 02/10/2023 0858   ALKPHOS 87 02/10/2023 0858   BILITOT 0.5 02/10/2023 0858   GFRNONAA >60 02/10/2023 0858    No results found for: "SPEP", "UPEP"  Lab Results  Component Value Date   WBC 3.4 (L) 03/24/2023   NEUTROABS 1.6 (L) 03/24/2023   HGB 11.4 (L) 03/24/2023   HCT 35.8 (L) 03/24/2023   MCV 72.5 (L) 03/24/2023   PLT 241 03/24/2023      Chemistry      Component Value Date/Time   NA 141 02/10/2023 0858   K 4.0 02/10/2023 0858   CL 109 02/10/2023 0858   CO2 26 02/10/2023 0858   BUN 10 02/10/2023 0858   CREATININE 0.64 02/10/2023 0858      Component Value Date/Time   CALCIUM 9.0 02/10/2023 0858   ALKPHOS 87 02/10/2023 0858   AST 12 (L) 02/10/2023 0858   ALT 11 02/10/2023 0858   BILITOT 0.5 02/10/2023 1610

## 2023-03-24 NOTE — Assessment & Plan Note (Signed)
So far, she tolerated treatment well Her last imaging study showed no evidence of disease She has gained a lot of weight due to eating a lot of longer wrist food but is motivated to lose some weight again with exercise We will continue pembrolizumab every 6 weeks Plan to repeat imaging study in of the year

## 2023-03-24 NOTE — Assessment & Plan Note (Signed)
She has gained a lot of weight recently She stated she is compliant taking her Synthroid TSH is pending I will call her with test results and adjust the dose of Synthroid as needed

## 2023-03-25 ENCOUNTER — Other Ambulatory Visit: Payer: Self-pay

## 2023-03-26 LAB — T4: T4, Total: 7.6 ug/dL (ref 4.5–12.0)

## 2023-04-06 ENCOUNTER — Other Ambulatory Visit: Payer: Self-pay

## 2023-04-15 ENCOUNTER — Encounter: Payer: Self-pay | Admitting: Hematology and Oncology

## 2023-04-19 ENCOUNTER — Encounter: Payer: Self-pay | Admitting: Hematology and Oncology

## 2023-05-03 ENCOUNTER — Telehealth: Payer: Self-pay

## 2023-05-03 ENCOUNTER — Other Ambulatory Visit: Payer: Self-pay | Admitting: Hematology and Oncology

## 2023-05-03 DIAGNOSIS — C55 Malignant neoplasm of uterus, part unspecified: Secondary | ICD-10-CM

## 2023-05-03 NOTE — Telephone Encounter (Signed)
This RN called pt to relay information to have CT scheduled. VM box full.

## 2023-05-03 NOTE — Telephone Encounter (Signed)
This RN called and LVM stating that pt needs to have CT scheduled. Number [phone] provided for pt to call and schedule.

## 2023-05-04 ENCOUNTER — Telehealth: Payer: Self-pay

## 2023-05-04 NOTE — Telephone Encounter (Signed)
Attempted to call her regarding appt for tomorrow and needing a CT scan prior to treatment. Attempted to call other #'s listed no answer and did not leave a message.

## 2023-05-05 ENCOUNTER — Other Ambulatory Visit: Payer: Self-pay

## 2023-05-05 ENCOUNTER — Inpatient Hospital Stay: Payer: Medicare HMO | Admitting: Hematology and Oncology

## 2023-05-05 ENCOUNTER — Inpatient Hospital Stay: Payer: Medicare HMO

## 2023-05-05 ENCOUNTER — Inpatient Hospital Stay: Payer: Medicare HMO | Attending: Gynecologic Oncology

## 2023-05-05 ENCOUNTER — Encounter: Payer: Self-pay | Admitting: Hematology and Oncology

## 2023-05-05 VITALS — BP 153/66 | HR 56 | Temp 98.2°F | Resp 17 | Wt 233.5 lb

## 2023-05-05 DIAGNOSIS — Z5112 Encounter for antineoplastic immunotherapy: Secondary | ICD-10-CM | POA: Insufficient documentation

## 2023-05-05 DIAGNOSIS — C55 Malignant neoplasm of uterus, part unspecified: Secondary | ICD-10-CM | POA: Diagnosis not present

## 2023-05-05 DIAGNOSIS — Z9079 Acquired absence of other genital organ(s): Secondary | ICD-10-CM | POA: Insufficient documentation

## 2023-05-05 DIAGNOSIS — E039 Hypothyroidism, unspecified: Secondary | ICD-10-CM | POA: Diagnosis not present

## 2023-05-05 DIAGNOSIS — D539 Nutritional anemia, unspecified: Secondary | ICD-10-CM | POA: Insufficient documentation

## 2023-05-05 DIAGNOSIS — Z90710 Acquired absence of both cervix and uterus: Secondary | ICD-10-CM | POA: Insufficient documentation

## 2023-05-05 DIAGNOSIS — Z90722 Acquired absence of ovaries, bilateral: Secondary | ICD-10-CM | POA: Insufficient documentation

## 2023-05-05 DIAGNOSIS — E538 Deficiency of other specified B group vitamins: Secondary | ICD-10-CM | POA: Diagnosis not present

## 2023-05-05 DIAGNOSIS — C7800 Secondary malignant neoplasm of unspecified lung: Secondary | ICD-10-CM | POA: Diagnosis not present

## 2023-05-05 LAB — CBC WITH DIFFERENTIAL (CANCER CENTER ONLY)
Abs Immature Granulocytes: 0 10*3/uL (ref 0.00–0.07)
Basophils Absolute: 0 10*3/uL (ref 0.0–0.1)
Basophils Relative: 1 %
Eosinophils Absolute: 0.1 10*3/uL (ref 0.0–0.5)
Eosinophils Relative: 3 %
HCT: 36.8 % (ref 36.0–46.0)
Hemoglobin: 11.4 g/dL — ABNORMAL LOW (ref 12.0–15.0)
Immature Granulocytes: 0 %
Lymphocytes Relative: 34 %
Lymphs Abs: 1.1 10*3/uL (ref 0.7–4.0)
MCH: 22.7 pg — ABNORMAL LOW (ref 26.0–34.0)
MCHC: 31 g/dL (ref 30.0–36.0)
MCV: 73.3 fL — ABNORMAL LOW (ref 80.0–100.0)
Monocytes Absolute: 0.3 10*3/uL (ref 0.1–1.0)
Monocytes Relative: 8 %
Neutro Abs: 1.8 10*3/uL (ref 1.7–7.7)
Neutrophils Relative %: 54 %
Platelet Count: 246 10*3/uL (ref 150–400)
RBC: 5.02 MIL/uL (ref 3.87–5.11)
RDW: 16.3 % — ABNORMAL HIGH (ref 11.5–15.5)
WBC Count: 3.4 10*3/uL — ABNORMAL LOW (ref 4.0–10.5)
nRBC: 0 % (ref 0.0–0.2)

## 2023-05-05 LAB — CMP (CANCER CENTER ONLY)
ALT: 13 U/L (ref 0–44)
AST: 12 U/L — ABNORMAL LOW (ref 15–41)
Albumin: 3.9 g/dL (ref 3.5–5.0)
Alkaline Phosphatase: 90 U/L (ref 38–126)
Anion gap: 5 (ref 5–15)
BUN: 16 mg/dL (ref 8–23)
CO2: 27 mmol/L (ref 22–32)
Calcium: 9.4 mg/dL (ref 8.9–10.3)
Chloride: 109 mmol/L (ref 98–111)
Creatinine: 0.82 mg/dL (ref 0.44–1.00)
GFR, Estimated: >60 mL/min (ref >60)
Glucose, Bld: 90 mg/dL (ref 70–99)
Potassium: 4 mmol/L (ref 3.5–5.1)
Sodium: 141 mmol/L (ref 135–145)
Total Bilirubin: 0.4 mg/dL (ref 0.3–1.2)
Total Protein: 7.1 g/dL (ref 6.5–8.1)

## 2023-05-05 LAB — VITAMIN B12: Vitamin B-12: 277 pg/mL (ref 180–914)

## 2023-05-05 LAB — TSH: TSH: 3.05 u[IU]/mL (ref 0.350–4.500)

## 2023-05-05 NOTE — Assessment & Plan Note (Signed)
Recent blood work suggested B12 deficiency She will get B12 injection along with chemotherapy with every cycle 

## 2023-05-05 NOTE — Assessment & Plan Note (Signed)
She has gained a lot of weight recently She stated she is compliant taking her Synthroid TSH is pending I will call her with test results and adjust the dose of Synthroid as needed

## 2023-05-05 NOTE — Assessment & Plan Note (Signed)
She tolerated last cycle of treatment well Unfortunately, her insurance will not approve another dose of treatment without CT imaging and we were not able to get hold of her I shared with her findings from her insurance company and she agreed to proceed with CT imaging next week I will defer her treatment and see her back in 2 weeks

## 2023-05-05 NOTE — Progress Notes (Signed)
[facility] OFFICE PROGRESS NOTE  Patient Care Team: Pcp, No as PCP - General  ASSESSMENT & PLAN:  Uterine cancer (HCC) She tolerated last cycle of treatment well Unfortunately, her insurance will not approve another dose of treatment without CT imaging and we were not able to get hold of her I shared with her findings from her insurance company and she agreed to proceed with CT imaging next week I will defer her treatment and see her back in 2 weeks  Deficiency anemia Recent blood work suggested B12 deficiency She will get B12 injection along with chemotherapy with every cycle  Hypothyroidism She has gained a lot of weight recently She stated she is compliant taking her Synthroid TSH is pending I will call her with test results and adjust the dose of Synthroid as needed  Orders Placed This Encounter  Procedures   CBC with Differential (Cancer Center Only)    Standing Status:   Future    Standing Expiration Date:   05/18/2024   CMP (Cancer Center only)    Standing Status:   Future    Standing Expiration Date:   05/18/2024   T4    Standing Status:   Future    Standing Expiration Date:   05/18/2024   TSH    Standing Status:   Future    Standing Expiration Date:   05/18/2024    All questions were answered. The patient knows to call the clinic with any problems, questions or concerns. The total time spent in the appointment was 20 minutes encounter with patients including review of chart and various tests results, discussions about plan of care and coordination of care plan   Artis Delay, MD 05/05/2023 10:07 AM  INTERVAL HISTORY: Please see below for problem oriented charting. she returns for treatment follow-up I was notified by her insurance company 2 days ago that she needs CT imaging but we were not able to get hold of her I told the patient today She has no new symptoms since her last visit  REVIEW OF SYSTEMS:   Constitutional: Denies fevers, chills or  abnormal weight loss Eyes: Denies blurriness of vision Ears, nose, mouth, throat, and face: Denies mucositis or sore throat Respiratory: Denies cough, dyspnea or wheezes Cardiovascular: Denies palpitation, chest discomfort or lower extremity swelling Gastrointestinal:  Denies nausea, heartburn or change in bowel habits Skin: Denies abnormal skin rashes Lymphatics: Denies new lymphadenopathy or easy bruising Neurological:Denies numbness, tingling or new weaknesses Behavioral/Psych: Mood is stable, no new changes  All other systems were reviewed with the patient and are negative.  I have reviewed the past medical history, past surgical history, social history and family history with the patient and they are unchanged from previous note.  ALLERGIES:  has No Known Allergies.  MEDICATIONS:  Current Outpatient Medications  Medication Sig Dispense Refill   acetaminophen (TYLENOL) 325 MG tablet Take 650 mg by mouth at bedtime as needed (pain).     levothyroxine (SYNTHROID) 150 MCG tablet Take 1 tablet (150 mcg total) by mouth daily before breakfast. 30 tablet 1   magnesium hydroxide (MILK OF MAGNESIA) 400 MG/5ML suspension Take 30 mLs by mouth daily.     polyethylene glycol (MIRALAX / GLYCOLAX) 17 g packet Take 17 g by mouth daily. (Patient taking differently: Take 17 g by mouth daily as needed for mild constipation.) 30 each 1   No current facility-administered medications for this visit.    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   Uterine cancer (HCC)  01/05/2021 Imaging   Outside CT imaging  Bilateral hydronephrosis related extrinsic compression on the ureters by the enlarged uterus.   Multiple calcified uterine  fibroids although peripherally enhancing diffuse decreased attenuation is noted centrally within the uterus highly suspicious for endometrial neoplasm given the current history of vaginal bleeding. Further evaluation is recommended. MRI may be helpful. Additionally tissue sampling would likely be helpful.   Hypodensity in the pancreas at the junction of the head and body of the pancreas. This may be related to volume averaging although the possibility of a cystic lesion could not be totally excluded. MRI is recommended for further evaluation.    01/05/2021 Initial Diagnosis   The patient reported at least 1 year of postmenopausal bleeding.  She does not have a primary care provider or gynecologist.  She had known fibroids and had noted that she needed a hysterectomy but had not gotten around to seeking care for this.  She presented to the [facility] emergency department on January 05, 2021 with new onset right lower quadrant pain and this prompted a CT scan of the abdomen and pelvis to be performed on 01/05/2021.     01/14/2021 Pathology Results   FINAL MICROSCOPIC DIAGNOSIS:   A. CERVIX, ENDOCERVIX, BIOPSY:  -  Endometrioid carcinoma, FIGO grade 1  -  See comment    01/26/2021 Imaging   MRI abdomen  1. No suspicious pancreatic mass identified. 2. Moderate to severe bilateral hydroureteronephrosis, similar to recent CT the abdomen and pelvis, presumably related to extrinsic ureteral compression from the patient's enlarged uterus, as demonstrated on recent CT the abdomen and pelvis 01/05/2021.   02/01/2021 Surgery   Surgery in [facility]  Date of Service: February 01, 2021 3:13 PM  Preoperative Diagnoses: Endometrial cancer  Postoperative Diagnoses: Endometrial cancer  Procedures: Total abdominal hysterectomy, bilateral salpingo-oophorectomy, left pelvic lymphadenectomy, right para-aortic lymphadenectomy  Surgeon: Adolphus Birchwood, MD  Findings: Tumor appreciated in the cervical canal. Large, broad, multi fibroid  uterus with a calcified fibroid filling the posterior cul de sac, an anterior lower uterine segment fibroid, and bilateral fibroids near the level of the cardinal ligament. No evidence of intraperitoneal disease. Normal appearing bilateral tubes and ovaries. Bilateral hydroureter from uterine compression. Tumor excreted from specimen into the pelvis at time of removal. An approximately 2cm enlarged left pelvic lymph node and a 1cm enlarged right aorta caval lymph node were identified.     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) - Right ovary, negative for tumor - Bilateral fallopian tubes, negative for tumor - Multiple leiomyomata (up to 8.5 cm)   B: Lymph node, aortocaval, excision - Metastatic carcinoma involving 1 lymph node (0.4 cm) (1/1)   C: Lymph node, left pelvic, excision -Metastatic carcinoma involving 1 lymph node (0.3 cm) (1/1)   Diagnosis Comment    The majority of this tumor is composed of well differentiated (overall grade 2, but with areas consistent with grade 1) endometrioid adenocarcinoma with squamous and mucinous  differentiation (approximately 70%); multifocally there is abrupt transition to an undifferentiated malignancy with a high-grade monotonous noncohesive appearance (best seen in slide A2) consistent with undifferentiated carcinoma.  The overall morphologic and immunophenotypic findings are consistent with a dedifferentiated endometrial carcinoma.   Of note, the carcinoma present in the lymph nodes is well differentiated endometrioid carcinoma and the metastatic deposit in the ovary is undifferentiated carcinoma.  Carcinoma is present less than 1 mm from uterine corpus serosa (in the lower uterine segment).  The assessment of the paracervical soft tissue margins is challenging due to the disrupted nature of the specimen in this area, however tumor appears present less than 1 mm from paracervical soft tissue.  No definite parametrial invasion is identified.   Immunohistochemistry performed at [facility] (with appropriate controls) demonstrates the following immunoprofile in cells of interest: Well differentiated endometrioid carcinoma: CK AE1/AE3: Positive ER: Positive (>90%, strong staining) P53: Favor wild-type (see comment below)   Dedifferentiated carcinoma: CK AE1/AE3: Very rare cells, predominantly negative ER: Negative P53: Small focus of mutant type staining in a background of wild-type staining (see comment below)   P53 staining comment: In the well differentiated carcinoma component the staining pattern is predominantly wild-type although focally there is slightly increased levels of staining noted.  In the dedifferentiated tumor component, most of the tissue exhibits wild-type pattern staining, however there is a distinct small area that exhibits increased/overexpressed mutant pattern p53 staining.    Immunohistochemistry testing for mismatch repair (MMR) proteins is performed on block A2:   MLH1:  Loss of nuclear expression MSH2:  Intact nuclear expression MSH6:  Intact nuclear expression PMS2:  Loss of nuclear expression   Loss of nuclear expression of MLH1 and PMS2 can be a result of sporadic microsatellite instability (via hypermethylation of the MLH1 promoter region) or due to Lynch syndrome / HNPCC. Materials will be submitted to the Molecular [facility]ology Laboratory [phone]) for [facility] promoter hypermethylation testing. These results will be issued in a separate report.      03/05/2021 Initial Diagnosis   Uterine cancer (HCC)   03/05/2021 Cancer Staging   Staging form: Corpus Uteri -  Carcinoma and Carcinosarcoma, AJCC 8th Edition - Pathologic stage from 03/05/2021: FIGO Stage IVB (rpT3, pN2a, cM1) - Signed by Artis Delay, MD on 11/12/2021 Stage prefix: Recurrence   03/11/2021 Imaging   1. Status post interval hysterectomy and oophorectomy. 2. There is a rim enhancing soft tissue mass centered in the vagina, measuring approximately 4.4 x 3.1 cm. This is new, or significantly enlarged compared to prior examination dated 01/05/2021. Findings are concerning for malignant involvement. Correlate with physical examination. Contrast enhanced MRI may be helpful to further detail soft tissue anatomy of the low pelvis if indicated. 3. There is at least one newly enlarged left iliac lymph node measuring up to 1.4 x 1.3 cm, nonspecific although concerning for nodal metastatic disease. 4. Fluid attenuation lymphocele or seroma adjacent to surgical clip at the level right ovarian vein. 5. Status post interval midline laparotomy. Subcutaneous fluid within the inferior aspect of the wound. The presence or absence of infection is not established by CT.     03/23/2021 - 07/09/2021 Chemotherapy   Patient is on Treatment Plan : UTERINE Carboplatin AUC 6 / Paclitaxel q21d     03/23/2021 Procedure   Placement of a subcutaneous power-injectable port device. Catheter tip at the superior cavoatrial junction   07/30/2021 Imaging   1. Large ill-defined partially enhancing and likely centrally necrotic vaginal and paravaginal mass. Recommend correlation with  direct visualization. MRI pelvis without and with contrast may be helpful for further evaluation. 2. No findings suspicious for omental or peritoneal surface disease. No adenopathy.   08/09/2021 - 09/08/2021 Radiation Therapy   Indication for treatment:  Curative       Radiation treatment dates:   08/09/21 through 09/08/21 Site/dose:  IMRT Uterus; Vagina : 41.5 Gy delivered in 23 Fx with 1.80 Gy/Fx (Planned dosage was for 45.00 GY delivered in 25 Fx with  1.80 Gy/Fx, her final 2 treatments (and boost) was canceled secondary to significant radiation reaction)   Beams/energy: 6X   11/11/2021 Imaging   1. 11 mm left upper lobe pulmonary nodule in the apicoposterior segment abuts the posterior pleural margin. In this clinical context the appearance is very concerning for a metastatic lesion although we do not have prior cross-sectional imaging to assess stability. Proximity to the fifth rib and adjacent major fissure might make percutaneous biopsy tricky; PET-CT would also be one way of assessing the metabolic activity/malignant potential of this lesion. This may be a good candidate case for discussion at multidisciplinary cancer conference. 2. Increased adenopathy along the left iliac chain, including an internal iliac node which abuts the left sacral plexus. 3. The lower vaginal and vestibular mass measures about 7.0 by 5.5 by 5.9 cm and has some internal gas which may indicate ulceration along the vestibule/perineum. Poor definition of tissue planes between this process in the base of the urinary bladder as well as the anus. The mass extends along the left anterior obturator internus muscle. 4. Other imaging findings of potential clinical significance: Prominent main pulmonary artery, cannot exclude pulmonary arterial hypertension. Aortic Atherosclerosis (ICD10-I70.0). Lower lumbar spondylosis and degenerative disc disease.   11/19/2021 - 03/11/2022 Chemotherapy   Patient is on Treatment Plan : UTERINE Pembrolizumab (200) q21d     11/19/2021 -  Chemotherapy   Patient is on Treatment Plan : UTERINE Pembrolizumab (400) q42d     01/21/2022 Imaging   1. Status post hysterectomy. Generally diminished size of an ulcerated mass involving the vagina when compared to prior examination, difficult to accurately measure due to poor resolution of adjacent soft tissues. 2. Significant interval decrease in size of left pelvic sidewall and iliac lymphadenopathy. 3.  Interval enlargement of a pulmonary nodule of the posterior left upper lobe. 4. 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 5. New, diffuse urinary bladder wall thickening and adjacent fat stranding, consistent with nonspecific infectious or inflammatory cystitis, most likely related to local radiation therapy.   04/22/2022 Imaging   1. Resolution of previously noted soft tissue mass associated with the apex of the vagina. 2. Regression of previously noted left upper lobe pulmonary nodule. However, there are multiple new satellite nodules in the adjacent lung parenchyma in the upper left lung, largest of which measures 1.2 x 0.6 cm in the superior segment of the left lower lobe. Close attention on follow-up studies is recommended. 3. Interval development of mildly enlarged bilateral inguinal lymph nodes. These could be reactive, however, the possibility of locoregional nodal metastases is not excluded, and close attention on follow-up imaging is recommended to ensure regression of these findings. 4. Aortic atherosclerosis, in addition to left anterior descending coronary artery disease. Assessment for potential risk factor modification, dietary therapy or pharmacologic therapy may be warranted, if clinically indicated.  5. Additional incidental findings, as above.   08/29/2022  Imaging   1. Previously noted clustered nodularity in the upper left lung has resolved compared to the prior study. However, today's study demonstrates a new mass-like area of architectural distortion with some internal air bronchograms and cavitation in the posterior aspect of the left upper lobe in the same region. Although this is mass-like in appearance on axial images, this is rather linear in appearance on coronal and sagittal reconstructions. If there has been  radiation therapy to this region, this could simply represent an area of evolving postradiation mass-like fibrosis. In the absence of a history of radiation therapy, these findings are presumably of infectious etiology. Neoplasm is not favored, but difficult to entirely exclude. Close attention on follow-up studies is recommended to evaluate for the evolution of these findings. No other suspicious appearing pulmonary nodules or masses are noted on today's examination. 2. No definite signs of metastatic disease noted in the abdomen or pelvis. 3. Multiple prominent borderline enlarged and minimally enlarged bilateral inguinal lymph nodes, slightly less prominent than the prior examination. Continued attention on follow-up studies is recommended. 4. Severe circumferential thickening of the urinary bladder wall without discrete bladder wall mass. This is of uncertain etiology and significance, but correlation with urinalysis is recommended to exclude the possibility of cystitis. Attention on follow-up imaging is recommended to ensure resolution. 5. Aortic atherosclerosis, in addition to left anterior descending coronary artery disease. Please note that although the presence of coronary artery calcium documents the presence of coronary artery disease, the severity of this disease and any potential stenosis cannot be assessed on this non-gated CT examination. Assessment for potential risk factor modification, dietary therapy or pharmacologic therapy may be warranted, if clinically indicated. 6. Additional incidental findings, as above.     12/23/2022 Imaging   CT ABDOMEN PELVIS W CONTRAST  Result Date: 12/22/2022 CLINICAL DATA:  Cervical cancer; * Tracking Code: BO * EXAM: CT ABDOMEN AND PELVIS WITH CONTRAST TECHNIQUE: Multidetector CT imaging of the abdomen and pelvis was performed using the standard protocol following bolus administration of intravenous contrast. RADIATION DOSE REDUCTION: This exam was  performed according to the departmental dose-optimization program which includes automated exposure control, adjustment of the mA and/or kV according to patient size and/or use of iterative reconstruction technique. CONTRAST:  OMNIPAQUE IOHEXOL 300 MG/ML  SOLN COMPARISON:  CT chest, abdomen and pelvis dated August 29, 2022 FINDINGS: Lower chest: Small hiatal hernia.  No acute abnormality. Hepatobiliary: No focal liver abnormality is seen. No gallstones, gallbladder wall thickening, or biliary dilatation. Pancreas: Relative hypoattenuation of the pancreatic head, unchanged when compared with multiple priors and likely due to focal fatty atrophy. No pancreatic ductal dilatation or surrounding inflammatory changes. Spleen: Normal in size without focal abnormality. Adrenals/Urinary Tract: Bilateral adrenal glands are unremarkable. Hydronephrosis or nephrolithiasis. Bladder is unremarkable. Stomach/Bowel: Stomach is within normal limits. Appendix appears normal. No evidence of bowel wall thickening, distention, or inflammatory changes. Vascular/Lymphatic: Aortic atherosclerosis. No enlarged abdominal or pelvic lymph nodes. Reproductive: Status post hysterectomy. No adnexal masses. Other: No abdominal wall hernia or abnormality. No abdominopelvic ascites. Musculoskeletal: No acute or significant osseous findings. IMPRESSION: 1. No evidence of metastatic disease in the abdomen or pelvis. 2. Stable prominent bilateral inguinal lymph nodes. 3. Aortic Atherosclerosis (ICD10-I70.0). Electronically Signed   By: Allegra Lai M.D.   On: 12/22/2022 15:38        PHYSICAL EXAMINATION: ECOG PERFORMANCE STATUS: 1 - Symptomatic but completely ambulatory  Vitals:   05/05/23 0905  BP: (!) 153/66  Pulse: (!) 56  Resp: 17  Temp: 98.2 F (36.8 C)  SpO2: 99%   Filed Weights   05/05/23 0905  Weight: 233 lb 7.5 oz (105.9 kg)    GENERAL:alert, no distress and comfortable   LABORATORY DATA:  I have reviewed  the data as listed    Component Value Date/Time   NA 141 05/05/2023 0848   K 4.0 05/05/2023 0848   CL 109 05/05/2023 0848   CO2 27 05/05/2023 0848   GLUCOSE 90 05/05/2023 0848   BUN 16 05/05/2023 0848   CREATININE 0.82 05/05/2023 0848   CALCIUM 9.4 05/05/2023 0848   PROT 7.1 05/05/2023 0848   ALBUMIN 3.9 05/05/2023 0848   AST 12 (L) 05/05/2023 0848   ALT 13 05/05/2023 0848   ALKPHOS 90 05/05/2023 0848   BILITOT 0.4 05/05/2023 0848   GFRNONAA >60 05/05/2023 0848    No results found for: "SPEP", "UPEP"  Lab Results  Component Value Date   WBC 3.4 (L) 05/05/2023   NEUTROABS 1.8 05/05/2023   HGB 11.4 (L) 05/05/2023   HCT 36.8 05/05/2023   MCV 73.3 (L) 05/05/2023   PLT 246 05/05/2023      Chemistry      Component Value Date/Time   NA 141 05/05/2023 0848   K 4.0 05/05/2023 0848   CL 109 05/05/2023 0848   CO2 27 05/05/2023 0848   BUN 16 05/05/2023 0848   CREATININE 0.82 05/05/2023 0848      Component Value Date/Time   CALCIUM 9.4 05/05/2023 0848   ALKPHOS 90 05/05/2023 0848   AST 12 (L) 05/05/2023 0848   ALT 13 05/05/2023 0848   BILITOT 0.4 05/05/2023 0848

## 2023-05-06 ENCOUNTER — Other Ambulatory Visit: Payer: Self-pay

## 2023-05-07 LAB — T4: T4, Total: 8.8 ug/dL (ref 4.5–12.0)

## 2023-05-11 ENCOUNTER — Telehealth: Payer: Self-pay

## 2023-05-11 NOTE — Telephone Encounter (Signed)
Called and left a message asking her to call the office back regarding CT scan scheduled for tomorrow.

## 2023-05-12 ENCOUNTER — Ambulatory Visit (HOSPITAL_COMMUNITY): Payer: Medicare HMO

## 2023-05-12 ENCOUNTER — Telehealth: Payer: Self-pay

## 2023-05-12 ENCOUNTER — Encounter: Payer: Self-pay | Admitting: Hematology and Oncology

## 2023-05-12 NOTE — Telephone Encounter (Signed)
Called and left another message to call the office. CT scheduled for today 11/1 has been canceled due to not having insurance approval for CT.

## 2023-05-12 NOTE — Telephone Encounter (Signed)
Returned her call and she is aware that CT appt canceled for today due to pending PA for CT.

## 2023-05-16 ENCOUNTER — Telehealth: Payer: Self-pay | Admitting: Oncology

## 2023-05-16 NOTE — Telephone Encounter (Signed)
Called Candice Parks and let her know the CT scan has been approved by her insurance. Gave her the number to call for central scheduling.  Advised we will watch for the CT appointment will reschedule her appointments on 05/19/23 to after the CT.  She verbalized understanding and is gong to call right now to schedule the CT.

## 2023-05-17 ENCOUNTER — Other Ambulatory Visit: Payer: Self-pay | Admitting: Hematology and Oncology

## 2023-05-17 ENCOUNTER — Telehealth: Payer: Self-pay | Admitting: Hematology and Oncology

## 2023-05-17 ENCOUNTER — Telehealth: Payer: Self-pay

## 2023-05-17 NOTE — Telephone Encounter (Signed)
Spoke with patient confirming upcoming appointment  

## 2023-05-17 NOTE — Telephone Encounter (Signed)
-----   Message from Artis Delay sent at 05/17/2023  8:55 AM EST ----- Regarding: appt Hi,  Please call her, if unable, pass the message to the nurse tomorrow to call Her insurance will not approve chemo until CT is done So I am going to cancel her chemo this week and reschedule to following week after CT

## 2023-05-17 NOTE — Telephone Encounter (Signed)
Called pt to make her aware per below message from MD. Pt is already aware per call from RN Navigator 05/16/23.

## 2023-05-18 ENCOUNTER — Other Ambulatory Visit: Payer: Self-pay

## 2023-05-19 ENCOUNTER — Ambulatory Visit: Payer: Medicare HMO | Admitting: Hematology and Oncology

## 2023-05-19 ENCOUNTER — Ambulatory Visit: Payer: Medicare HMO

## 2023-05-19 ENCOUNTER — Other Ambulatory Visit: Payer: Medicare HMO

## 2023-05-26 ENCOUNTER — Ambulatory Visit (HOSPITAL_COMMUNITY)
Admission: RE | Admit: 2023-05-26 | Discharge: 2023-05-26 | Disposition: A | Discharge status: Home / Self Care | Payer: Medicare HMO | Source: Ambulatory Visit | Attending: Hematology and Oncology | Admitting: Hematology and Oncology

## 2023-05-26 DIAGNOSIS — R188 Other ascites: Secondary | ICD-10-CM | POA: Diagnosis not present

## 2023-05-26 DIAGNOSIS — C55 Malignant neoplasm of uterus, part unspecified: Secondary | ICD-10-CM | POA: Diagnosis not present

## 2023-05-26 DIAGNOSIS — N3289 Other specified disorders of bladder: Secondary | ICD-10-CM | POA: Diagnosis not present

## 2023-05-26 DIAGNOSIS — C539 Malignant neoplasm of cervix uteri, unspecified: Secondary | ICD-10-CM | POA: Diagnosis not present

## 2023-05-26 DIAGNOSIS — I700 Atherosclerosis of aorta: Secondary | ICD-10-CM | POA: Diagnosis not present

## 2023-05-26 MED ORDER — IOHEXOL 300 MG/ML  SOLN
100.0000 mL | Freq: Once | INTRAMUSCULAR | Status: AC | PRN
  Administered 2023-05-26: 100 mL via INTRAVENOUS

## 2023-05-26 MED ORDER — HEPARIN SOD (PORK) LOCK FLUSH 100 UNIT/ML IV SOLN
INTRAVENOUS | Status: AC
  Filled 2023-05-26: qty 5

## 2023-05-26 MED ORDER — HEPARIN SOD (PORK) LOCK FLUSH 100 UNIT/ML IV SOLN
500.0000 [IU] | Freq: Once | INTRAVENOUS | Status: AC
Start: 2023-05-26 — End: 2023-05-26
  Administered 2023-05-26: 500 [IU] via INTRAVENOUS

## 2023-05-26 MED ORDER — IOHEXOL 300 MG/ML  SOLN
30.0000 mL | Freq: Once | INTRAMUSCULAR | Status: AC | PRN
  Administered 2023-05-26: 30 mL via ORAL

## 2023-06-02 ENCOUNTER — Inpatient Hospital Stay: Payer: Medicare HMO | Attending: Gynecologic Oncology | Admitting: Hematology and Oncology

## 2023-06-02 ENCOUNTER — Inpatient Hospital Stay: Payer: Medicare HMO

## 2023-06-02 ENCOUNTER — Inpatient Hospital Stay: Payer: Medicare HMO | Attending: Gynecologic Oncology

## 2023-06-02 ENCOUNTER — Telehealth: Payer: Self-pay

## 2023-06-02 ENCOUNTER — Encounter: Payer: Self-pay | Admitting: Hematology and Oncology

## 2023-06-02 VITALS — BP 140/74 | HR 70 | Temp 98.0°F | Resp 18 | Ht 63.0 in | Wt 234.6 lb

## 2023-06-02 DIAGNOSIS — Z9079 Acquired absence of other genital organ(s): Secondary | ICD-10-CM | POA: Diagnosis not present

## 2023-06-02 DIAGNOSIS — D61818 Other pancytopenia: Secondary | ICD-10-CM | POA: Insufficient documentation

## 2023-06-02 DIAGNOSIS — Z5112 Encounter for antineoplastic immunotherapy: Secondary | ICD-10-CM | POA: Diagnosis not present

## 2023-06-02 DIAGNOSIS — E538 Deficiency of other specified B group vitamins: Secondary | ICD-10-CM | POA: Diagnosis not present

## 2023-06-02 DIAGNOSIS — C55 Malignant neoplasm of uterus, part unspecified: Secondary | ICD-10-CM | POA: Diagnosis not present

## 2023-06-02 DIAGNOSIS — E039 Hypothyroidism, unspecified: Secondary | ICD-10-CM | POA: Diagnosis not present

## 2023-06-02 DIAGNOSIS — C7800 Secondary malignant neoplasm of unspecified lung: Secondary | ICD-10-CM | POA: Diagnosis not present

## 2023-06-02 DIAGNOSIS — Z90710 Acquired absence of both cervix and uterus: Secondary | ICD-10-CM | POA: Diagnosis not present

## 2023-06-02 DIAGNOSIS — Z90722 Acquired absence of ovaries, bilateral: Secondary | ICD-10-CM | POA: Diagnosis not present

## 2023-06-02 LAB — CBC WITH DIFFERENTIAL (CANCER CENTER ONLY)
Abs Immature Granulocytes: 0.01 10*3/uL (ref 0.00–0.07)
Basophils Absolute: 0 10*3/uL (ref 0.0–0.1)
Basophils Relative: 1 %
Eosinophils Absolute: 0.2 10*3/uL (ref 0.0–0.5)
Eosinophils Relative: 6 %
HCT: 37.6 % (ref 36.0–46.0)
Hemoglobin: 11.6 g/dL — ABNORMAL LOW (ref 12.0–15.0)
Immature Granulocytes: 0 %
Lymphocytes Relative: 35 %
Lymphs Abs: 1.1 10*3/uL (ref 0.7–4.0)
MCH: 22.7 pg — ABNORMAL LOW (ref 26.0–34.0)
MCHC: 30.9 g/dL (ref 30.0–36.0)
MCV: 73.4 fL — ABNORMAL LOW (ref 80.0–100.0)
Monocytes Absolute: 0.4 10*3/uL (ref 0.1–1.0)
Monocytes Relative: 12 %
Neutro Abs: 1.4 10*3/uL — ABNORMAL LOW (ref 1.7–7.7)
Neutrophils Relative %: 46 %
Platelet Count: 209 10*3/uL (ref 150–400)
RBC: 5.12 MIL/uL — ABNORMAL HIGH (ref 3.87–5.11)
RDW: 16.5 % — ABNORMAL HIGH (ref 11.5–15.5)
WBC Count: 3.1 10*3/uL — ABNORMAL LOW (ref 4.0–10.5)
nRBC: 0 % (ref 0.0–0.2)

## 2023-06-02 LAB — CMP (CANCER CENTER ONLY)
ALT: 14 U/L (ref 0–44)
AST: 14 U/L — ABNORMAL LOW (ref 15–41)
Albumin: 4 g/dL (ref 3.5–5.0)
Alkaline Phosphatase: 97 U/L (ref 38–126)
Anion gap: 5 (ref 5–15)
BUN: 18 mg/dL (ref 8–23)
CO2: 27 mmol/L (ref 22–32)
Calcium: 9.5 mg/dL (ref 8.9–10.3)
Chloride: 108 mmol/L (ref 98–111)
Creatinine: 0.7 mg/dL (ref 0.44–1.00)
GFR, Estimated: >60 mL/min (ref >60)
Glucose, Bld: 84 mg/dL (ref 70–99)
Potassium: 4 mmol/L (ref 3.5–5.1)
Sodium: 140 mmol/L (ref 135–145)
Total Bilirubin: 0.3 mg/dL (ref <1.2)
Total Protein: 7.2 g/dL (ref 6.5–8.1)

## 2023-06-02 LAB — TSH: TSH: 0.091 u[IU]/mL — ABNORMAL LOW (ref 0.350–4.500)

## 2023-06-02 MED ORDER — HEPARIN SOD (PORK) LOCK FLUSH 100 UNIT/ML IV SOLN
500.0000 [IU] | Freq: Once | INTRAVENOUS | Status: AC | PRN
  Administered 2023-06-02: 500 [IU]

## 2023-06-02 MED ORDER — LEVOTHYROXINE SODIUM 125 MCG PO TABS: 125.0000 ug | ORAL_TABLET | Freq: Every day | ORAL | 3 refills | Status: DC

## 2023-06-02 MED ORDER — CYANOCOBALAMIN 1000 MCG/ML IJ SOLN
1000.0000 ug | Freq: Once | INTRAMUSCULAR | Status: AC
  Administered 2023-06-02: 1000 ug via INTRAMUSCULAR
  Filled 2023-06-02: qty 1

## 2023-06-02 MED ORDER — SODIUM CHLORIDE 0.9% FLUSH
10.0000 mL | Freq: Once | INTRAVENOUS | Status: AC
  Administered 2023-06-02: 10 mL

## 2023-06-02 MED ORDER — SODIUM CHLORIDE 0.9% FLUSH
10.0000 mL | INTRAVENOUS | Status: DC | PRN
Start: 2023-06-02 — End: 2023-06-02
  Administered 2023-06-02: 10 mL

## 2023-06-02 MED ORDER — SODIUM CHLORIDE 0.9 % IV SOLN: Freq: Once | INTRAVENOUS | Status: AC

## 2023-06-02 MED ORDER — SODIUM CHLORIDE 0.9 % IV SOLN
400.0000 mg | Freq: Once | INTRAVENOUS | Status: AC
  Administered 2023-06-02: 400 mg via INTRAVENOUS
  Filled 2023-06-02: qty 16

## 2023-06-02 NOTE — Patient Instructions (Signed)

## 2023-06-02 NOTE — Telephone Encounter (Signed)
Called and left below message. Ask her to call the office back for questions.

## 2023-06-02 NOTE — Assessment & Plan Note (Addendum)
She has been getting vitamin B12 injection consistently with improvement of her blood count She will continue the same Recent repeat B12 level was adequate We will continue treatment indefinitely

## 2023-06-02 NOTE — Progress Notes (Signed)
[facility] OFFICE PROGRESS NOTE  Patient Care Team: Pcp, No as PCP - General  ASSESSMENT & PLAN:  Uterine cancer (HCC) She tolerated last cycle of treatment well CT imaging is reviewed which show no evidence of active reoccurrence We will continue immunotherapy Plan to repeat imaging study again next year around February or March 2025  Vitamin B12 deficiency She has been getting vitamin B12 injection consistently with improvement of her blood count She will continue the same Recent repeat B12 level was adequate We will continue treatment indefinitely  Hypothyroidism She has gained a lot of weight recently She stated she is compliant taking her Synthroid Repeat TSH is abnormal I will readjust the dose of her Synthroid  Pancytopenia, acquired (HCC) She has mild intermittent pancytopenia, nonspecific etiology She is not symptomatic Monitor closely  Orders Placed This Encounter  Procedures   CBC with Differential (Cancer Center Only)    Standing Status:   Future    Standing Expiration Date:   07/13/2024   CMP (Cancer Center only)    Standing Status:   Future    Standing Expiration Date:   07/13/2024   T4    Standing Status:   Future    Standing Expiration Date:   07/13/2024   TSH    Standing Status:   Future    Standing Expiration Date:   07/13/2024    All questions were answered. The patient knows to call the clinic with any problems, questions or concerns. The total time spent in the appointment was 40 minutes encounter with patients including review of chart and various tests results, discussions about plan of care and coordination of care plan   Artis Delay, MD 06/02/2023 3:44 PM  INTERVAL HISTORY: Please see below for problem oriented charting. she returns for review of test results Apart from weight gain, she is not symptomatic We discussed test results and future follow-up   REVIEW OF SYSTEMS:   Constitutional: Denies fevers, chills or abnormal  weight loss Eyes: Denies blurriness of vision Ears, nose, mouth, throat, and face: Denies mucositis or sore throat Respiratory: Denies cough, dyspnea or wheezes Cardiovascular: Denies palpitation, chest discomfort or lower extremity swelling Gastrointestinal:  Denies nausea, heartburn or change in bowel habits Skin: Denies abnormal skin rashes Lymphatics: Denies new lymphadenopathy or easy bruising Neurological:Denies numbness, tingling or new weaknesses Behavioral/Psych: Mood is stable, no new changes  All other systems were reviewed with the patient and are negative.  I have reviewed the past medical history, past surgical history, social history and family history with the patient and they are unchanged from previous note.  ALLERGIES:  has No Known Allergies.  MEDICATIONS:  Current Outpatient Medications  Medication Sig Dispense Refill   acetaminophen (TYLENOL) 325 MG tablet Take 650 mg by mouth at bedtime as needed (pain).     levothyroxine (SYNTHROID) 125 MCG tablet Take 1 tablet (125 mcg total) by mouth daily before breakfast. 30 tablet 3   magnesium hydroxide (MILK OF MAGNESIA) 400 MG/5ML suspension Take 30 mLs by mouth daily.     polyethylene glycol (MIRALAX / GLYCOLAX) 17 g packet Take 17 g by mouth daily. (Patient taking differently: Take 17 g by mouth daily as needed for mild constipation.) 30 each 1   No current facility-administered medications for this visit.   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   Uterine cancer (HCC)  01/05/2021 Imaging   Outside CT imaging  Bilateral hydronephrosis related extrinsic compression on the ureters by the enlarged uterus.   Multiple calcified uterine fibroids although peripherally enhancing diffuse decreased attenuation is noted centrally within the uterus highly suspicious for endometrial neoplasm given the current history of vaginal bleeding. Further evaluation is recommended. MRI may be helpful. Additionally tissue sampling would likely be helpful.   Hypodensity in the pancreas at the junction of the head and body of the pancreas. This may be related to volume averaging although the possibility of a cystic lesion could not be totally excluded. MRI is recommended for further evaluation.    01/05/2021 Initial Diagnosis   The patient reported at least 1 year of postmenopausal bleeding.  She does not have a primary care provider or gynecologist.  She had known fibroids and had noted that she needed a hysterectomy but had not gotten around to seeking care for this.  She presented to the [facility] emergency department on January 05, 2021 with new onset right lower quadrant pain and this prompted a CT scan of the abdomen and pelvis to be performed on 01/05/2021.     01/14/2021 Pathology Results   FINAL MICROSCOPIC DIAGNOSIS:   A. CERVIX, ENDOCERVIX, BIOPSY:  -  Endometrioid carcinoma, FIGO grade 1  -  See comment    01/26/2021 Imaging   MRI abdomen  1. No suspicious pancreatic mass identified. 2. Moderate to severe bilateral hydroureteronephrosis, similar to recent CT the abdomen and pelvis, presumably related to extrinsic ureteral compression from the patient's enlarged uterus, as demonstrated on recent CT the abdomen and pelvis 01/05/2021.   02/01/2021 Surgery   Surgery in [facility]  Date of Service: February 01, 2021 3:13 PM  Preoperative Diagnoses: Endometrial cancer  Postoperative Diagnoses: Endometrial cancer  Procedures:  Total abdominal hysterectomy, bilateral salpingo-oophorectomy, left pelvic lymphadenectomy, right para-aortic lymphadenectomy  Surgeon: Adolphus Birchwood, MD  Findings: Tumor appreciated in the cervical canal. Large, broad, multi fibroid uterus with a calcified fibroid filling the posterior cul de sac, an anterior lower uterine segment fibroid, and bilateral fibroids near the level of the cardinal ligament. No evidence of intraperitoneal disease. Normal appearing bilateral tubes and ovaries. Bilateral hydroureter from uterine compression. Tumor excreted from specimen into the pelvis at time of removal. An approximately 2cm enlarged left pelvic lymph node and a 1cm enlarged right aorta caval lymph node were identified.     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) - Right ovary, negative for tumor - Bilateral fallopian tubes, negative for tumor - Multiple leiomyomata (up to 8.5 cm)   B: Lymph node, aortocaval, excision - Metastatic carcinoma involving 1 lymph node (0.4 cm) (1/1)   C: Lymph node, left pelvic, excision -Metastatic carcinoma involving 1 lymph node (0.3 cm) (1/1)   Diagnosis Comment    The majority of this  tumor is composed of well differentiated (overall grade 2, but with areas consistent with grade 1) endometrioid adenocarcinoma with squamous and mucinous differentiation (approximately 70%); multifocally there is abrupt transition to an undifferentiated malignancy with a high-grade monotonous noncohesive appearance (best seen in slide A2) consistent with undifferentiated carcinoma.  The overall morphologic and immunophenotypic findings  are consistent with a dedifferentiated endometrial carcinoma.   Of note, the carcinoma present in the lymph nodes is well differentiated endometrioid carcinoma and the metastatic deposit in the ovary is undifferentiated carcinoma. Carcinoma is present less than 1 mm from uterine corpus serosa (in the lower uterine segment).  The assessment of the paracervical soft tissue margins is challenging due to the disrupted nature of the specimen in this area, however tumor appears present less than 1 mm from paracervical soft tissue.  No definite parametrial invasion is identified.   Immunohistochemistry performed at [facility] (with appropriate controls) demonstrates the following immunoprofile in cells of interest: Well differentiated endometrioid carcinoma: CK AE1/AE3: Positive ER: Positive (>90%, strong staining) P53: Favor wild-type (see comment below)   Dedifferentiated carcinoma: CK AE1/AE3: Very rare cells, predominantly negative ER: Negative P53: Small focus of mutant type staining in a background of wild-type staining (see comment below)   P53 staining comment: In the well differentiated carcinoma component the staining pattern is predominantly wild-type although focally there is slightly increased levels of staining noted.  In the dedifferentiated tumor component, most of the tissue exhibits wild-type pattern staining, however there is a distinct small area that exhibits increased/overexpressed mutant pattern p53 staining.    Immunohistochemistry testing for mismatch repair (MMR) proteins is performed on block A2:   MLH1:  Loss of nuclear expression MSH2:  Intact nuclear expression MSH6:  Intact nuclear expression PMS2:  Loss of nuclear expression   Loss of nuclear expression of MLH1 and PMS2 can be a result of sporadic microsatellite instability (via hypermethylation of the MLH1 promoter region) or due to Lynch syndrome / HNPCC. Materials will be submitted to the [facility]ology Laboratory  [phone]) for [facility] promoter hypermethylation testing. These results will be issued in a separate report.      03/05/2021 Initial Diagnosis   Uterine cancer (HCC)   03/05/2021 Cancer Staging   Staging form: Corpus Uteri - Carcinoma and Carcinosarcoma, AJCC 8th Edition - Pathologic stage from 03/05/2021: FIGO Stage IVB (rpT3, pN2a, cM1) - Signed by Artis Delay, MD on 11/12/2021 Stage prefix: Recurrence   03/11/2021 Imaging   1. Status post interval hysterectomy and oophorectomy. 2. There is a rim enhancing soft tissue mass centered in the vagina, measuring approximately 4.4 x 3.1 cm. This is new, or significantly enlarged compared to prior examination dated 01/05/2021. Findings are concerning for malignant involvement. Correlate with physical examination. Contrast enhanced MRI may be helpful to further detail soft tissue anatomy of the low pelvis if indicated. 3. There is at least one newly enlarged left iliac lymph node measuring up to 1.4 x 1.3 cm, nonspecific although concerning for nodal metastatic disease. 4. Fluid attenuation lymphocele or seroma adjacent to surgical clip at the level right ovarian vein. 5. Status post interval midline laparotomy. Subcutaneous fluid within the inferior aspect of the wound. The presence or absence of infection is not established by CT.     03/23/2021 - 07/09/2021 Chemotherapy   Patient is on Treatment Plan : UTERINE Carboplatin AUC 6 / Paclitaxel q21d     03/23/2021 Procedure   Placement of a subcutaneous power-injectable port device. Catheter tip at the superior cavoatrial junction  07/30/2021 Imaging   1. Large ill-defined partially enhancing and likely centrally necrotic vaginal and paravaginal mass. Recommend correlation with direct visualization. MRI pelvis without and with contrast may be helpful for further evaluation. 2. No findings suspicious for omental or peritoneal surface disease. No adenopathy.   08/09/2021 - 09/08/2021 Radiation Therapy    Indication for treatment:  Curative       Radiation treatment dates:   08/09/21 through 09/08/21 Site/dose:  IMRT Uterus; Vagina : 41.5 Gy delivered in 23 Fx with 1.80 Gy/Fx (Planned dosage was for 45.00 GY delivered in 25 Fx with 1.80 Gy/Fx, her final 2 treatments (and boost) was canceled secondary to significant radiation reaction)   Beams/energy: 6X   11/11/2021 Imaging   1. 11 mm left upper lobe pulmonary nodule in the apicoposterior segment abuts the posterior pleural margin. In this clinical context the appearance is very concerning for a metastatic lesion although we do not have prior cross-sectional imaging to assess stability. Proximity to the fifth rib and adjacent major fissure might make percutaneous biopsy tricky; PET-CT would also be one way of assessing the metabolic activity/malignant potential of this lesion. This may be a good candidate case for discussion at multidisciplinary cancer conference. 2. Increased adenopathy along the left iliac chain, including an internal iliac node which abuts the left sacral plexus. 3. The lower vaginal and vestibular mass measures about 7.0 by 5.5 by 5.9 cm and has some internal gas which may indicate ulceration along the vestibule/perineum. Poor definition of tissue planes between this process in the base of the urinary bladder as well as the anus. The mass extends along the left anterior obturator internus muscle. 4. Other imaging findings of potential clinical significance: Prominent main pulmonary artery, cannot exclude pulmonary arterial hypertension. Aortic Atherosclerosis (ICD10-I70.0). Lower lumbar spondylosis and degenerative disc disease.   11/19/2021 - 03/11/2022 Chemotherapy   Patient is on Treatment Plan : UTERINE Pembrolizumab (200) q21d     11/19/2021 -  Chemotherapy   Patient is on Treatment Plan : UTERINE Pembrolizumab (400) q42d     01/21/2022 Imaging   1. Status post hysterectomy. Generally diminished size of an ulcerated mass  involving the vagina when compared to prior examination, difficult to accurately measure due to poor resolution of adjacent soft tissues. 2. Significant interval decrease in size of left pelvic sidewall and iliac lymphadenopathy. 3. Interval enlargement of a pulmonary nodule of the posterior left upper lobe. 4. 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 5. New, diffuse urinary bladder wall thickening and adjacent fat stranding, consistent with nonspecific infectious or inflammatory cystitis, most likely related to local radiation therapy.   04/22/2022 Imaging   1. Resolution of previously noted soft tissue mass associated with the apex of the vagina. 2. Regression of previously noted left upper lobe pulmonary nodule. However, there are multiple new satellite nodules in the adjacent lung parenchyma in the upper left lung, largest of which measures 1.2 x 0.6 cm in the superior segment of the left lower lobe. Close attention on follow-up studies is recommended. 3. Interval development of mildly enlarged bilateral inguinal lymph nodes. These could be reactive, however, the possibility of locoregional nodal metastases is not excluded, and close attention on follow-up imaging is recommended to ensure regression of these findings. 4. Aortic atherosclerosis, in addition to left anterior descending coronary artery disease. Assessment for potential risk factor modification, dietary  therapy or pharmacologic therapy may be warranted, if clinically indicated.  5. Additional incidental findings, as above.   08/29/2022 Imaging   1. Previously noted clustered nodularity in the upper left lung has resolved compared to the prior study. However, today's study demonstrates a new mass-like area of architectural distortion with some internal air bronchograms and  cavitation in the posterior aspect of the left upper lobe in the same region. Although this is mass-like in appearance on axial images, this is rather linear in appearance on coronal and sagittal reconstructions. If there has been radiation therapy to this region, this could simply represent an area of evolving postradiation mass-like fibrosis. In the absence of a history of radiation therapy, these findings are presumably of infectious etiology. Neoplasm is not favored, but difficult to entirely exclude. Close attention on follow-up studies is recommended to evaluate for the evolution of these findings. No other suspicious appearing pulmonary nodules or masses are noted on today's examination. 2. No definite signs of metastatic disease noted in the abdomen or pelvis. 3. Multiple prominent borderline enlarged and minimally enlarged bilateral inguinal lymph nodes, slightly less prominent than the prior examination. Continued attention on follow-up studies is recommended. 4. Severe circumferential thickening of the urinary bladder wall without discrete bladder wall mass. This is of uncertain etiology and significance, but correlation with urinalysis is recommended to exclude the possibility of cystitis. Attention on follow-up imaging is recommended to ensure resolution. 5. Aortic atherosclerosis, in addition to left anterior descending coronary artery disease. Please note that although the presence of coronary artery calcium documents the presence of coronary artery disease, the severity of this disease and any potential stenosis cannot be assessed on this non-gated CT examination. Assessment for potential risk factor modification, dietary therapy or pharmacologic therapy may be warranted, if clinically indicated. 6. Additional incidental findings, as above.     12/23/2022 Imaging   CT ABDOMEN PELVIS W CONTRAST  Result Date: 12/22/2022 CLINICAL DATA:  Cervical cancer; * Tracking Code: BO * EXAM: CT ABDOMEN  AND PELVIS WITH CONTRAST TECHNIQUE: Multidetector CT imaging of the abdomen and pelvis was performed using the standard protocol following bolus administration of intravenous contrast. RADIATION DOSE REDUCTION: This exam was performed according to the departmental dose-optimization program which includes automated exposure control, adjustment of the mA and/or kV according to patient size and/or use of iterative reconstruction technique. CONTRAST:  OMNIPAQUE IOHEXOL 300 MG/ML  SOLN COMPARISON:  CT chest, abdomen and pelvis dated August 29, 2022 FINDINGS: Lower chest: Small hiatal hernia.  No acute abnormality. Hepatobiliary: No focal liver abnormality is seen. No gallstones, gallbladder wall thickening, or biliary dilatation. Pancreas: Relative hypoattenuation of the pancreatic head, unchanged when compared with multiple priors and likely due to focal fatty atrophy. No pancreatic ductal dilatation or surrounding inflammatory changes. Spleen: Normal in size without focal abnormality. Adrenals/Urinary Tract: Bilateral adrenal glands are unremarkable. Hydronephrosis or nephrolithiasis. Bladder is unremarkable. Stomach/Bowel: Stomach is within normal limits. Appendix appears normal. No evidence of bowel wall thickening, distention, or inflammatory changes. Vascular/Lymphatic: Aortic atherosclerosis. No enlarged abdominal or pelvic lymph nodes. Reproductive: Status post hysterectomy. No adnexal masses. Other: No abdominal wall hernia or abnormality. No abdominopelvic ascites. Musculoskeletal: No acute or significant osseous findings. IMPRESSION: 1. No evidence of metastatic disease in the abdomen or pelvis. 2. Stable prominent bilateral inguinal lymph nodes. 3. Aortic Atherosclerosis (ICD10-I70.0). Electronically Signed   By: Allegra Lai M.D.   On: 12/22/2022 15:38      05/26/2023 Imaging   CT CHEST ABDOMEN  PELVIS W CONTRAST  Result Date: 05/26/2023 CLINICAL DATA:  History of cervical cancer,  follow-up. * Tracking Code: BO * EXAM: CT CHEST, ABDOMEN, AND PELVIS WITH CONTRAST TECHNIQUE: Multidetector CT imaging of the chest, abdomen and pelvis was performed following the standard protocol during bolus administration of intravenous contrast. RADIATION DOSE REDUCTION: This exam was performed according to the departmental dose-optimization program which includes automated exposure control, adjustment of the mA and/or kV according to patient size and/or use of iterative reconstruction technique. CONTRAST:  OMNIPAQUE IOHEXOL 300 MG/ML  SOLN COMPARISON:  Multiple priors including most recent CT December 21, 2022 and August 29, 2022. FINDINGS: CT CHEST FINDINGS Cardiovascular: Accessed right chest Port-A-Cath with tip near the superior cavoatrial junction. Aortic atherosclerosis. Normal caliber thoracic aorta. No central pulmonary embolus on this nondedicated study. Normal size heart. No significant pericardial effusion/thickening. Mediastinum/Nodes: No suspicious thyroid nodule. No pathologically enlarged mediastinal, hilar or axillary lymph nodes. Slightly increased soft tissue in the anterior mediastinum which conforms to the underlying aorta measuring 10 x 9 mm on image 22/2, possibly reflecting rebound thymic tissue. Small hiatal hernia. Lungs/Pleura: Similar scarring/atelectasis in the left upper lobe on image 47/4. No new suspicious pulmonary nodules or masses. No pleural effusion. No pneumothorax. Musculoskeletal: No aggressive lytic or blastic lesion of bone. Degenerative change of the spine. CT ABDOMEN PELVIS FINDINGS Hepatobiliary: No suspicious hepatic lesion. Gallbladder is unremarkable. No biliary ductal dilation. Pancreas: Similar hypodensity in the pancreatic head favored to reflect fatty infiltration. No pancreatic ductal dilation or evidence of acute inflammation. Spleen: No splenomegaly. Adrenals/Urinary Tract: Bilateral adrenal glands appear normal. No hydronephrosis. Kidneys demonstrate  symmetric enhancement. Mild wall thickening of the urinary bladder. Stomach/Bowel: Radiopaque enteric contrast material traverses the cecum. Stomach is unremarkable for minimal distension. No pathologic dilation of small or large bowel. Mild wall thickening with a small amount of fluid in the sigmoid colon. Question of an 8 mm enhancing mural nodule in the sigmoid colon on image 111/2 and image 94/5. Vascular/Lymphatic: Scattered aortic atherosclerosis. Smooth IVC contours. No pathologically enlarged abdominal or pelvic lymph nodes. Prominent inguinal lymph nodes are similar prior for instance a left inguinal lymph node measuring 10 mm in short axis on image 119 with preservation of its fatty hilum, unchanged from prior examination. Reproductive: Uterus is surgically absent without new suspicious enhancing nodularity along the vaginal cuff. No suspicious adnexal mass. Other: No significant abdominopelvic free fluid. No discrete peritoneal or omental nodularity. Postsurgical change in the anterior abdominal wall. Musculoskeletal: No aggressive lytic or blastic lesion of bone. Multilevel degenerative changes spine. Degenerative change of the hips. Chronic osseous changes of the pubic symphysis. IMPRESSION: 1. Status post hysterectomy without evidence of local recurrence. 2. No convincing evidence of metastatic disease in the chest, abdomen or pelvis. 3. Question of an 8 mm enhancing mural nodule in the sigmoid colon, suggest further evaluation with colonoscopy. 4. Mild wall thickening of the urinary bladder, correlate with urinalysis to exclude cystitis. 5. Slightly increased soft tissue in the anterior mediastinum which conforms to the underlying aorta measuring 10 x 9 mm, possibly reflecting rebound thymic tissue. Suggest continued attention on follow-up imaging. 6.  Aortic Atherosclerosis (ICD10-I70.0). These results will be called to the ordering clinician or representative by the Radiologist Assistant, and  communication documented in the PACS or [employer]. Electronically Signed   By: Maudry Mayhew M.D.   On: 05/26/2023 13:30        PHYSICAL EXAMINATION: ECOG PERFORMANCE STATUS: 1 - Symptomatic but completely ambulatory  Vitals:   06/02/23 0947  BP: (!) 140/74  Pulse: 70  Resp: 18  Temp: 98 F (36.7 C)  SpO2: 100%   Filed Weights   06/02/23 0947  Weight: 234 lb 9.6 oz (106.4 kg)    GENERAL:alert, no distress and comfortable  LABORATORY DATA:  I have reviewed the data as listed    Component Value Date/Time   NA 140 06/02/2023 0921   K 4.0 06/02/2023 0921   CL 108 06/02/2023 0921   CO2 27 06/02/2023 0921   GLUCOSE 84 06/02/2023 0921   BUN 18 06/02/2023 0921   CREATININE 0.70 06/02/2023 0921   CALCIUM 9.5 06/02/2023 0921   PROT 7.2 06/02/2023 0921   ALBUMIN 4.0 06/02/2023 0921   AST 14 (L) 06/02/2023 0921   ALT 14 06/02/2023 0921   ALKPHOS 97 06/02/2023 0921   BILITOT 0.3 06/02/2023 0921   GFRNONAA >60 06/02/2023 0921    No results found for: "SPEP", "UPEP"  Lab Results  Component Value Date   WBC 3.1 (L) 06/02/2023   NEUTROABS 1.4 (L) 06/02/2023   HGB 11.6 (L) 06/02/2023   HCT 37.6 06/02/2023   MCV 73.4 (L) 06/02/2023   PLT 209 06/02/2023      Chemistry      Component Value Date/Time   NA 140 06/02/2023 0921   K 4.0 06/02/2023 0921   CL 108 06/02/2023 0921   CO2 27 06/02/2023 0921   BUN 18 06/02/2023 0921   CREATININE 0.70 06/02/2023 0921      Component Value Date/Time   CALCIUM 9.5 06/02/2023 0921   ALKPHOS 97 06/02/2023 0921   AST 14 (L) 06/02/2023 0921   ALT 14 06/02/2023 0921   BILITOT 0.3 06/02/2023 0921       RADIOGRAPHIC STUDIES: I have personally reviewed the radiological images as listed and agreed with the findings in the report. CT CHEST ABDOMEN PELVIS W CONTRAST  Result Date: 05/26/2023 CLINICAL DATA:  History of cervical cancer, follow-up. * Tracking Code: BO * EXAM: CT CHEST, ABDOMEN, AND PELVIS WITH CONTRAST  TECHNIQUE: Multidetector CT imaging of the chest, abdomen and pelvis was performed following the standard protocol during bolus administration of intravenous contrast. RADIATION DOSE REDUCTION: This exam was performed according to the departmental dose-optimization program which includes automated exposure control, adjustment of the mA and/or kV according to patient size and/or use of iterative reconstruction technique. CONTRAST:  OMNIPAQUE IOHEXOL 300 MG/ML  SOLN COMPARISON:  Multiple priors including most recent CT December 21, 2022 and August 29, 2022. FINDINGS: CT CHEST FINDINGS Cardiovascular: Accessed right chest Port-A-Cath with tip near the superior cavoatrial junction. Aortic atherosclerosis. Normal caliber thoracic aorta. No central pulmonary embolus on this nondedicated study. Normal size heart. No significant pericardial effusion/thickening. Mediastinum/Nodes: No suspicious thyroid nodule. No pathologically enlarged mediastinal, hilar or axillary lymph nodes. Slightly increased soft tissue in the anterior mediastinum which conforms to the underlying aorta measuring 10 x 9 mm on image 22/2, possibly reflecting rebound thymic tissue. Small hiatal hernia. Lungs/Pleura: Similar scarring/atelectasis in the left upper lobe on image 47/4. No new suspicious pulmonary nodules or masses. No pleural effusion. No pneumothorax. Musculoskeletal: No aggressive lytic or blastic lesion of bone. Degenerative change of the spine. CT ABDOMEN PELVIS FINDINGS Hepatobiliary: No suspicious hepatic lesion. Gallbladder is unremarkable. No biliary ductal dilation. Pancreas: Similar hypodensity in the pancreatic head favored to reflect fatty infiltration. No pancreatic ductal dilation or evidence of acute inflammation. Spleen: No splenomegaly. Adrenals/Urinary Tract: Bilateral adrenal glands appear normal. No hydronephrosis. Kidneys  demonstrate symmetric enhancement. Mild wall thickening of the urinary bladder. Stomach/Bowel:  Radiopaque enteric contrast material traverses the cecum. Stomach is unremarkable for minimal distension. No pathologic dilation of small or large bowel. Mild wall thickening with a small amount of fluid in the sigmoid colon. Question of an 8 mm enhancing mural nodule in the sigmoid colon on image 111/2 and image 94/5. Vascular/Lymphatic: Scattered aortic atherosclerosis. Smooth IVC contours. No pathologically enlarged abdominal or pelvic lymph nodes. Prominent inguinal lymph nodes are similar prior for instance a left inguinal lymph node measuring 10 mm in short axis on image 119 with preservation of its fatty hilum, unchanged from prior examination. Reproductive: Uterus is surgically absent without new suspicious enhancing nodularity along the vaginal cuff. No suspicious adnexal mass. Other: No significant abdominopelvic free fluid. No discrete peritoneal or omental nodularity. Postsurgical change in the anterior abdominal wall. Musculoskeletal: No aggressive lytic or blastic lesion of bone. Multilevel degenerative changes spine. Degenerative change of the hips. Chronic osseous changes of the pubic symphysis. IMPRESSION: 1. Status post hysterectomy without evidence of local recurrence. 2. No convincing evidence of metastatic disease in the chest, abdomen or pelvis. 3. Question of an 8 mm enhancing mural nodule in the sigmoid colon, suggest further evaluation with colonoscopy. 4. Mild wall thickening of the urinary bladder, correlate with urinalysis to exclude cystitis. 5. Slightly increased soft tissue in the anterior mediastinum which conforms to the underlying aorta measuring 10 x 9 mm, possibly reflecting rebound thymic tissue. Suggest continued attention on follow-up imaging. 6.  Aortic Atherosclerosis (ICD10-I70.0). These results will be called to the ordering clinician or representative by the Radiologist Assistant, and communication documented in the PACS or [employer]. Electronically Signed   By:  Maudry Mayhew M.D.   On: 05/26/2023 13:30

## 2023-06-02 NOTE — Assessment & Plan Note (Signed)
She has mild intermittent pancytopenia, nonspecific etiology She is not symptomatic Monitor closely

## 2023-06-02 NOTE — Telephone Encounter (Signed)
-----   Message from [employer] sent at 06/02/2023  2:26 PM EST ----- Regarding: TSH She now need dose reduction of synthroid to 125 mcg I will send new prescription to her pharmacy Tell her to hang on to the 150 mcg in case we need to go up the dose in the future

## 2023-06-02 NOTE — Assessment & Plan Note (Addendum)
She has gained a lot of weight recently She stated she is compliant taking her Synthroid Repeat TSH is abnormal I will readjust the dose of her Synthroid

## 2023-06-02 NOTE — Assessment & Plan Note (Signed)
She tolerated last cycle of treatment well CT imaging is reviewed which show no evidence of active reoccurrence We will continue immunotherapy Plan to repeat imaging study again next year around February or March 2025

## 2023-06-03 ENCOUNTER — Other Ambulatory Visit: Payer: Self-pay

## 2023-06-04 ENCOUNTER — Other Ambulatory Visit: Payer: Self-pay

## 2023-06-04 LAB — T4: T4, Total: 12.5 ug/dL — ABNORMAL HIGH (ref 4.5–12.0)

## 2023-06-06 ENCOUNTER — Other Ambulatory Visit: Payer: Self-pay

## 2023-07-14 ENCOUNTER — Inpatient Hospital Stay (HOSPITAL_BASED_OUTPATIENT_CLINIC_OR_DEPARTMENT_OTHER): Payer: Medicare HMO

## 2023-07-14 ENCOUNTER — Inpatient Hospital Stay: Payer: Medicare HMO | Attending: Gynecologic Oncology | Admitting: Hematology and Oncology

## 2023-07-14 ENCOUNTER — Telehealth: Payer: Self-pay

## 2023-07-14 ENCOUNTER — Other Ambulatory Visit: Payer: Self-pay

## 2023-07-14 ENCOUNTER — Encounter: Payer: Self-pay | Admitting: Hematology and Oncology

## 2023-07-14 ENCOUNTER — Inpatient Hospital Stay: Payer: Medicare HMO

## 2023-07-14 VITALS — BP 129/99 | HR 53 | Temp 97.7°F | Resp 18 | Ht 63.0 in | Wt 238.7 lb

## 2023-07-14 VITALS — BP 132/74 | HR 60 | Resp 17

## 2023-07-14 DIAGNOSIS — E039 Hypothyroidism, unspecified: Secondary | ICD-10-CM

## 2023-07-14 DIAGNOSIS — D61818 Other pancytopenia: Secondary | ICD-10-CM

## 2023-07-14 DIAGNOSIS — Z5112 Encounter for antineoplastic immunotherapy: Secondary | ICD-10-CM | POA: Insufficient documentation

## 2023-07-14 DIAGNOSIS — C7800 Secondary malignant neoplasm of unspecified lung: Secondary | ICD-10-CM | POA: Insufficient documentation

## 2023-07-14 DIAGNOSIS — Z90721 Acquired absence of ovaries, unilateral: Secondary | ICD-10-CM | POA: Diagnosis not present

## 2023-07-14 DIAGNOSIS — C55 Malignant neoplasm of uterus, part unspecified: Secondary | ICD-10-CM

## 2023-07-14 DIAGNOSIS — E538 Deficiency of other specified B group vitamins: Secondary | ICD-10-CM

## 2023-07-14 DIAGNOSIS — Z90710 Acquired absence of both cervix and uterus: Secondary | ICD-10-CM | POA: Diagnosis not present

## 2023-07-14 LAB — CMP (CANCER CENTER ONLY)
ALT: 13 U/L (ref 0–44)
AST: 14 U/L — ABNORMAL LOW (ref 15–41)
Albumin: 4.1 g/dL (ref 3.5–5.0)
Alkaline Phosphatase: 109 U/L (ref 38–126)
Anion gap: 7 (ref 5–15)
BUN: 12 mg/dL (ref 8–23)
CO2: 27 mmol/L (ref 22–32)
Calcium: 9.4 mg/dL (ref 8.9–10.3)
Chloride: 107 mmol/L (ref 98–111)
Creatinine: 0.77 mg/dL (ref 0.44–1.00)
GFR, Estimated: >60 mL/min (ref >60)
Glucose, Bld: 83 mg/dL (ref 70–99)
Potassium: 4 mmol/L (ref 3.5–5.1)
Sodium: 141 mmol/L (ref 135–145)
Total Bilirubin: 0.5 mg/dL (ref 0.0–1.2)
Total Protein: 7.3 g/dL (ref 6.5–8.1)

## 2023-07-14 LAB — CBC WITH DIFFERENTIAL (CANCER CENTER ONLY)
Abs Immature Granulocytes: 0.01 10*3/uL (ref 0.00–0.07)
Basophils Absolute: 0.1 10*3/uL (ref 0.0–0.1)
Basophils Relative: 2 %
Eosinophils Absolute: 0.2 10*3/uL (ref 0.0–0.5)
Eosinophils Relative: 6 %
HCT: 35.9 % — ABNORMAL LOW (ref 36.0–46.0)
Hemoglobin: 11.6 g/dL — ABNORMAL LOW (ref 12.0–15.0)
Immature Granulocytes: 0 %
Lymphocytes Relative: 37 %
Lymphs Abs: 1.3 10*3/uL (ref 0.7–4.0)
MCH: 22.8 pg — ABNORMAL LOW (ref 26.0–34.0)
MCHC: 32.3 g/dL (ref 30.0–36.0)
MCV: 70.5 fL — ABNORMAL LOW (ref 80.0–100.0)
Monocytes Absolute: 0.3 10*3/uL (ref 0.1–1.0)
Monocytes Relative: 10 %
Neutro Abs: 1.6 10*3/uL — ABNORMAL LOW (ref 1.7–7.7)
Neutrophils Relative %: 45 %
Platelet Count: 236 10*3/uL (ref 150–400)
RBC: 5.09 MIL/uL (ref 3.87–5.11)
RDW: 16.8 % — ABNORMAL HIGH (ref 11.5–15.5)
WBC Count: 3.5 10*3/uL — ABNORMAL LOW (ref 4.0–10.5)
nRBC: 0 % (ref 0.0–0.2)

## 2023-07-14 LAB — TSH: TSH: 22.492 u[IU]/mL — ABNORMAL HIGH (ref 0.350–4.500)

## 2023-07-14 MED ORDER — SODIUM CHLORIDE 0.9 % IV SOLN
400.0000 mg | Freq: Once | INTRAVENOUS | Status: AC
  Administered 2023-07-14: 400 mg via INTRAVENOUS
  Filled 2023-07-14: qty 16

## 2023-07-14 MED ORDER — SODIUM CHLORIDE 0.9% FLUSH
10.0000 mL | Freq: Once | INTRAVENOUS | Status: AC
Start: 2023-07-14 — End: 2023-07-14
  Administered 2023-07-14: 10 mL

## 2023-07-14 MED ORDER — SODIUM CHLORIDE 0.9% FLUSH
10.0000 mL | INTRAVENOUS | Status: DC | PRN
Start: 2023-07-14 — End: 2023-07-14
  Administered 2023-07-14: 10 mL

## 2023-07-14 MED ORDER — HEPARIN SOD (PORK) LOCK FLUSH 100 UNIT/ML IV SOLN
500.0000 [IU] | Freq: Once | INTRAVENOUS | Status: AC | PRN
  Administered 2023-07-14: 500 [IU]

## 2023-07-14 MED ORDER — SODIUM CHLORIDE 0.9 % IV SOLN
Freq: Once | INTRAVENOUS | Status: AC
Start: 2023-07-14 — End: 2023-07-14

## 2023-07-14 MED ORDER — LEVOTHYROXINE SODIUM 150 MCG PO TABS: 150.0000 ug | ORAL_TABLET | Freq: Every day | ORAL | 1 refills | Status: DC

## 2023-07-14 MED ORDER — CYANOCOBALAMIN 1000 MCG/ML IJ SOLN
1000.0000 ug | Freq: Once | INTRAMUSCULAR | Status: AC
Start: 2023-07-14 — End: 2023-07-14
  Administered 2023-07-14: 1000 ug via INTRAMUSCULAR
  Filled 2023-07-14: qty 1

## 2023-07-14 NOTE — Telephone Encounter (Signed)
 Called and left below message. Ask her to call the office back for questions. Rx sent to her pharmacy in [place].

## 2023-07-14 NOTE — Telephone Encounter (Signed)
-----   Message from Candice Parks sent at 07/14/2023  2:34 PM EST ----- TSH is high Increase synthroid  to 150 mcg Send new prescription to her local pharmacy, 60 pills, 1 refill

## 2023-07-14 NOTE — Assessment & Plan Note (Signed)
 She has gained a lot of weight recently She stated she is compliant taking her Synthroid Repeat TSH is pending I will adjust the dose of Synthroid as needed

## 2023-07-14 NOTE — Assessment & Plan Note (Signed)
 She has been getting vitamin B12 injection consistently with improvement of her blood count She will continue the same Recent repeat B12 level was adequate We will continue treatment indefinitely

## 2023-07-14 NOTE — Patient Instructions (Signed)

## 2023-07-14 NOTE — Progress Notes (Signed)
 [facility] OFFICE PROGRESS NOTE  Patient Care Team: Pcp, No as PCP - General  ASSESSMENT & PLAN:  Uterine cancer (HCC) She tolerated last cycle of treatment well CT imaging is reviewed which showed no evidence of active reoccurrence We will continue immunotherapy Plan to repeat imaging study again next year around May 2025  Pancytopenia, acquired [facility]) She has mild intermittent pancytopenia, nonspecific etiology She is not symptomatic Monitor closely  Vitamin B12 deficiency She has been getting vitamin B12 injection consistently with improvement of her blood count She will continue the same Recent repeat B12 level was adequate We will continue treatment indefinitely  Hypothyroidism She has gained a lot of weight recently She stated she is compliant taking her Synthroid  Repeat TSH is pending I will adjust the dose of Synthroid  as needed  Orders Placed This Encounter  Procedures   CBC with Differential (Cancer Center Only)    Standing Status:   Future    Expected Date:   08/25/2023    Expiration Date:   08/24/2024   CMP (Cancer Center only)    Standing Status:   Future    Expected Date:   08/25/2023    Expiration Date:   08/24/2024   T4    Standing Status:   Future    Expected Date:   08/25/2023    Expiration Date:   08/24/2024   TSH    Standing Status:   Future    Expected Date:   08/25/2023    Expiration Date:   08/24/2024    All questions were answered. The patient knows to call the clinic with any problems, questions or concerns. The total time spent in the appointment was 30 minutes encounter with patients including review of chart and various tests results, discussions about plan of care and coordination of care plan   Almarie Bedford, MD 07/14/2023 10:57 AM  INTERVAL HISTORY: Please see below for problem oriented charting. she returns for treatment and follow-up She is doing well She has great energy level No abnormal skin lesions Denies recent  infection She stated she is compliant taking her medications as directed  REVIEW OF SYSTEMS:   Constitutional: Denies fevers, chills or abnormal weight loss Eyes: Denies blurriness of vision Ears, nose, mouth, throat, and face: Denies mucositis or sore throat Respiratory: Denies cough, dyspnea or wheezes Cardiovascular: Denies palpitation, chest discomfort or lower extremity swelling Gastrointestinal:  Denies nausea, heartburn or change in bowel habits Skin: Denies abnormal skin rashes Lymphatics: Denies new lymphadenopathy or easy bruising Neurological:Denies numbness, tingling or new weaknesses Behavioral/Psych: Mood is stable, no new changes  All other systems were reviewed with the patient and are negative.  I have reviewed the past medical history, past surgical history, social history and family history with the patient and they are unchanged from previous note.  ALLERGIES:  has no known allergies.  MEDICATIONS:  Current Outpatient Medications  Medication Sig Dispense Refill   acetaminophen  (TYLENOL ) 325 MG tablet Take 650 mg by mouth at bedtime as needed (pain).     levothyroxine  (SYNTHROID ) 125 MCG tablet Take 1 tablet (125 mcg total) by mouth daily before breakfast. 30 tablet 3   magnesium hydroxide (MILK OF MAGNESIA) 400 MG/5ML suspension Take 30 mLs by mouth daily.     polyethylene glycol (MIRALAX  / GLYCOLAX ) 17 g packet Take 17 g by mouth daily. (Patient taking differently: Take 17 g by mouth daily as needed for mild constipation.) 30 each 1   No current facility-administered medications for this  visit.   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   Uterine cancer (HCC)  01/05/2021 Imaging   Outside CT imaging  Bilateral hydronephrosis related extrinsic compression on the ureters by the enlarged uterus.   Multiple calcified uterine fibroids although peripherally enhancing diffuse decreased attenuation is noted centrally within the uterus highly suspicious for endometrial neoplasm given the current history of vaginal bleeding. Further evaluation is recommended. MRI may be helpful. Additionally tissue sampling would likely be helpful.   Hypodensity in the pancreas at the junction of the head and body of the pancreas. This may be related to volume averaging although the possibility of a cystic lesion could not be totally excluded. MRI is recommended for further evaluation.    01/05/2021 Initial Diagnosis   The patient reported at least 1 year of postmenopausal bleeding.  She does not have a primary care provider or gynecologist.  She had known fibroids and had noted that she needed a hysterectomy but had not gotten around to seeking care for this.  She presented to the [facility] emergency department on January 05, 2021 with new onset right lower quadrant pain and this prompted a CT scan of the abdomen and pelvis to be performed on 01/05/2021.     01/14/2021 Pathology Results   FINAL MICROSCOPIC DIAGNOSIS:   A. CERVIX, ENDOCERVIX, BIOPSY:  -  Endometrioid carcinoma, FIGO grade 1  -  See comment    01/26/2021 Imaging   MRI abdomen  1. No suspicious pancreatic mass identified. 2. Moderate to severe bilateral  hydroureteronephrosis, similar to recent CT the abdomen and pelvis, presumably related to extrinsic ureteral compression from the patient's enlarged uterus, as demonstrated on recent CT the abdomen and pelvis 01/05/2021.   02/01/2021 Surgery   Surgery in [facility]  Date of Service: February 01, 2021 3:13 PM  Preoperative Diagnoses: Endometrial cancer  Postoperative Diagnoses: Endometrial cancer  Procedures: Total abdominal hysterectomy, bilateral salpingo-oophorectomy, left pelvic lymphadenectomy, right para-aortic lymphadenectomy  Surgeon: Maurilio Ship, MD  Findings: Tumor appreciated in the cervical canal. Large, broad, multi fibroid uterus with a calcified fibroid filling the posterior cul de sac, an anterior lower uterine segment fibroid, and bilateral fibroids near the level of the cardinal ligament. No evidence of intraperitoneal disease. Normal appearing bilateral tubes and ovaries. Bilateral hydroureter from uterine compression. Tumor excreted from specimen into the pelvis at time of removal. An approximately 2cm enlarged left pelvic lymph node and a 1cm enlarged right aorta caval lymph node were identified.     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 (0.4 cm) - Right ovary, negative for tumor - Bilateral fallopian tubes, negative for tumor - Multiple leiomyomata (up to 8.5 cm)   B: Lymph node, aortocaval, excision - Metastatic carcinoma involving 1 lymph node (0.4 cm) (1/1)   C: Lymph node, left pelvic, excision -Metastatic carcinoma involving 1 lymph node (0.3 cm) (1/1)   Diagnosis Comment    The majority of this tumor is composed of well  differentiated (overall grade 2, but with areas consistent with grade 1) endometrioid adenocarcinoma with squamous and mucinous differentiation (approximately 70%); multifocally there is abrupt transition to an undifferentiated malignancy with a high-grade monotonous noncohesive appearance (best seen in slide A2) consistent with undifferentiated carcinoma.  The overall morphologic and immunophenotypic findings are consistent with a dedifferentiated endometrial carcinoma.   Of note, the carcinoma present in the lymph nodes is well differentiated endometrioid carcinoma and the metastatic deposit in the ovary is undifferentiated carcinoma. Carcinoma is present less than 1 mm from uterine corpus serosa (in the lower uterine segment).  The assessment of the paracervical soft tissue margins is challenging due to the disrupted nature of the specimen in this area, however tumor appears present less than 1 mm from paracervical soft tissue.  No definite parametrial invasion is identified.   Immunohistochemistry performed at [facility] (with appropriate controls) demonstrates the following immunoprofile in cells of interest: Well differentiated endometrioid carcinoma: CK AE1/AE3: Positive ER: Positive (>90%, strong staining) P53: Favor wild-type (see comment below)   Dedifferentiated carcinoma: CK AE1/AE3: Very rare cells, predominantly negative ER: Negative P53: Small focus of mutant type staining in a background of wild-type staining (see comment below)   P53 staining comment: In the well differentiated carcinoma component the staining pattern is predominantly wild-type although focally there is slightly increased levels of staining noted.  In the dedifferentiated tumor component, most of the tissue exhibits wild-type pattern staining, however there is a distinct small area that exhibits increased/overexpressed mutant pattern p53 staining.    Immunohistochemistry testing for mismatch repair (MMR) proteins is  performed on block A2:   MLH1:  Loss of nuclear expression MSH2:  Intact nuclear expression MSH6:  Intact nuclear expression PMS2:  Loss of nuclear expression   Loss of nuclear expression of MLH1 and PMS2 can be a result of sporadic microsatellite instability (via hypermethylation of the MLH1 promoter region) or due to Lynch syndrome / HNPCC. Materials will be submitted to the [facility] [phone]) for MLH promoter hypermethylation testing. These results will be issued in a separate report.      03/05/2021 Initial Diagnosis   Uterine cancer (HCC)   03/05/2021 Cancer Staging   Staging form: Corpus Uteri - Carcinoma and Carcinosarcoma, AJCC 8th Edition - Pathologic stage from 03/05/2021: FIGO Stage IVB (rpT3, pN2a, cM1) - Signed by Lonn Hicks, MD on 11/12/2021 Stage prefix: Recurrence   03/11/2021 Imaging   1. Status post interval hysterectomy and oophorectomy. 2. There is a rim enhancing soft tissue mass centered in the vagina, measuring approximately 4.4 x 3.1 cm. This is new, or significantly enlarged compared to prior examination dated 01/05/2021. Findings are concerning for malignant involvement. Correlate with physical examination. Contrast enhanced MRI may be helpful to further detail soft tissue anatomy of the low pelvis if indicated. 3. There is at least one newly enlarged left iliac lymph node measuring up to 1.4 x 1.3 cm, nonspecific although concerning for nodal metastatic disease. 4.  Fluid attenuation lymphocele or seroma adjacent to surgical clip at the level right ovarian vein. 5. Status post interval midline laparotomy. Subcutaneous fluid within the inferior aspect of the wound. The presence or absence of infection is not established by CT.     03/23/2021 - 07/09/2021 Chemotherapy   Patient is on Treatment Plan : UTERINE Carboplatin  AUC 6 / Paclitaxel  q21d     03/23/2021 Procedure   Placement of a subcutaneous power-injectable port device. Catheter tip at  the superior cavoatrial junction   07/30/2021 Imaging   1. Large ill-defined partially enhancing and likely centrally necrotic vaginal and paravaginal mass. Recommend correlation with direct visualization. MRI pelvis without and with contrast may be helpful for further evaluation. 2. No findings suspicious for omental or peritoneal surface disease. No adenopathy.   08/09/2021 - 09/08/2021 Radiation Therapy   Indication for treatment:  Curative       Radiation treatment dates:   08/09/21 through 09/08/21 Site/dose:  IMRT Uterus; Vagina : 41.5 Gy delivered in 23 Fx with 1.80 Gy/Fx (Planned dosage was for 45.00 GY delivered in 25 Fx with 1.80 Gy/Fx, her final 2 treatments (and boost) was canceled secondary to significant radiation reaction)   Beams/energy: 6X   11/11/2021 Imaging   1. 11 mm left upper lobe pulmonary nodule in the apicoposterior segment abuts the posterior pleural margin. In this clinical context the appearance is very concerning for a metastatic lesion although we do not have prior cross-sectional imaging to assess stability. Proximity to the fifth rib and adjacent major fissure might make percutaneous biopsy tricky; PET-CT would also be one way of assessing the metabolic activity/malignant potential of this lesion. This may be a good candidate case for discussion at multidisciplinary cancer conference. 2. Increased adenopathy along the left iliac chain, including an internal iliac node which abuts the left sacral plexus. 3. The lower vaginal and vestibular mass measures about 7.0 by 5.5 by 5.9 cm and has some internal gas which may indicate ulceration along the vestibule/perineum. Poor definition of tissue planes between this process in the base of the urinary bladder as well as the anus. The mass extends along the left anterior obturator internus muscle. 4. Other imaging findings of potential clinical significance: Prominent main pulmonary artery, cannot exclude pulmonary arterial  hypertension. Aortic Atherosclerosis (ICD10-I70.0). Lower lumbar spondylosis and degenerative disc disease.   11/19/2021 - 03/11/2022 Chemotherapy   Patient is on Treatment Plan : UTERINE Pembrolizumab  (200) q21d     11/19/2021 -  Chemotherapy   Patient is on Treatment Plan : UTERINE Pembrolizumab  (400) q42d     01/21/2022 Imaging   1. Status post hysterectomy. Generally diminished size of an ulcerated mass involving the vagina when compared to prior examination, difficult to accurately measure due to poor resolution of adjacent soft tissues. 2. Significant interval decrease in size of left pelvic sidewall and iliac lymphadenopathy. 3. Interval enlargement of a pulmonary nodule of the posterior left upper lobe. 4. 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 5. New, diffuse urinary bladder wall thickening and adjacent fat stranding, consistent with nonspecific infectious or inflammatory cystitis, most likely related to local radiation therapy.   04/22/2022 Imaging   1. Resolution of previously noted soft tissue mass associated with the apex of the vagina. 2. Regression of previously noted left upper lobe pulmonary nodule. However, there are multiple new satellite nodules in the adjacent  lung parenchyma in the upper left lung, largest of which measures 1.2 x 0.6 cm in the superior segment of the left lower lobe. Close attention on follow-up studies is recommended. 3. Interval development of mildly enlarged bilateral inguinal lymph nodes. These could be reactive, however, the possibility of locoregional nodal metastases is not excluded, and close attention on follow-up imaging is recommended to ensure regression of these findings. 4. Aortic atherosclerosis, in addition to left anterior descending coronary artery disease. Assessment for potential  risk factor modification, dietary therapy or pharmacologic therapy may be warranted, if clinically indicated.  5. Additional incidental findings, as above.   08/29/2022 Imaging   1. Previously noted clustered nodularity in the upper left lung has resolved compared to the prior study. However, today's study demonstrates a new mass-like area of architectural distortion with some internal air bronchograms and cavitation in the posterior aspect of the left upper lobe in the same region. Although this is mass-like in appearance on axial images, this is rather linear in appearance on coronal and sagittal reconstructions. If there has been radiation therapy to this region, this could simply represent an area of evolving postradiation mass-like fibrosis. In the absence of a history of radiation therapy, these findings are presumably of infectious etiology. Neoplasm is not favored, but difficult to entirely exclude. Close attention on follow-up studies is recommended to evaluate for the evolution of these findings. No other suspicious appearing pulmonary nodules or masses are noted on today's examination. 2. No definite signs of metastatic disease noted in the abdomen or pelvis. 3. Multiple prominent borderline enlarged and minimally enlarged bilateral inguinal lymph nodes, slightly less prominent than the prior examination. Continued attention on follow-up studies is recommended. 4. Severe circumferential thickening of the urinary bladder wall without discrete bladder wall mass. This is of uncertain etiology and significance, but correlation with urinalysis is recommended to exclude the possibility of cystitis. Attention on follow-up imaging is recommended to ensure resolution. 5. Aortic atherosclerosis, in addition to left anterior descending coronary artery disease. Please note that although the presence of coronary artery calcium documents the presence of coronary artery disease, the severity of this disease and  any potential stenosis cannot be assessed on this non-gated CT examination. Assessment for potential risk factor modification, dietary therapy or pharmacologic therapy may be warranted, if clinically indicated. 6. Additional incidental findings, as above.     12/23/2022 Imaging   CT ABDOMEN PELVIS W CONTRAST  Result Date: 12/22/2022 CLINICAL DATA:  Cervical cancer; * Tracking Code: BO * EXAM: CT ABDOMEN AND PELVIS WITH CONTRAST TECHNIQUE: Multidetector CT imaging of the abdomen and pelvis was performed using the standard protocol following bolus administration of intravenous contrast. RADIATION DOSE REDUCTION: This exam was performed according to the departmental dose-optimization program which includes automated exposure control, adjustment of the mA and/or kV according to patient size and/or use of iterative reconstruction technique. CONTRAST:  OMNIPAQUE  IOHEXOL  300 MG/ML  SOLN COMPARISON:  CT chest, abdomen and pelvis dated August 29, 2022 FINDINGS: Lower chest: Small hiatal hernia.  No acute abnormality. Hepatobiliary: No focal liver abnormality is seen. No gallstones, gallbladder wall thickening, or biliary dilatation. Pancreas: Relative hypoattenuation of the pancreatic head, unchanged when compared with multiple priors and likely due to focal fatty atrophy. No pancreatic ductal dilatation or surrounding inflammatory changes. Spleen: Normal in size without focal abnormality. Adrenals/Urinary Tract: Bilateral adrenal glands are unremarkable. Hydronephrosis or nephrolithiasis. Bladder is unremarkable. Stomach/Bowel: Stomach is within normal limits. Appendix appears normal. No evidence of bowel wall  thickening, distention, or inflammatory changes. Vascular/Lymphatic: Aortic atherosclerosis. No enlarged abdominal or pelvic lymph nodes. Reproductive: Status post hysterectomy. No adnexal masses. Other: No abdominal wall hernia or abnormality. No abdominopelvic ascites. Musculoskeletal: No acute or  significant osseous findings. IMPRESSION: 1. No evidence of metastatic disease in the abdomen or pelvis. 2. Stable prominent bilateral inguinal lymph nodes. 3. Aortic Atherosclerosis (ICD10-I70.0). Electronically Signed   By: Rea Marc M.D.   On: 12/22/2022 15:38      05/26/2023 Imaging   CT CHEST ABDOMEN PELVIS W CONTRAST  Result Date: 05/26/2023 CLINICAL DATA:  History of cervical cancer, follow-up. * Tracking Code: BO * EXAM: CT CHEST, ABDOMEN, AND PELVIS WITH CONTRAST TECHNIQUE: Multidetector CT imaging of the chest, abdomen and pelvis was performed following the standard protocol during bolus administration of intravenous contrast. RADIATION DOSE REDUCTION: This exam was performed according to the departmental dose-optimization program which includes automated exposure control, adjustment of the mA and/or kV according to patient size and/or use of iterative reconstruction technique. CONTRAST:  OMNIPAQUE  IOHEXOL  300 MG/ML  SOLN COMPARISON:  Multiple priors including most recent CT December 21, 2022 and August 29, 2022. FINDINGS: CT CHEST FINDINGS Cardiovascular: Accessed right chest Port-A-Cath with tip near the superior cavoatrial junction. Aortic atherosclerosis. Normal caliber thoracic aorta. No central pulmonary embolus on this nondedicated study. Normal size heart. No significant pericardial effusion/thickening. Mediastinum/Nodes: No suspicious thyroid  nodule. No pathologically enlarged mediastinal, hilar or axillary lymph nodes. Slightly increased soft tissue in the anterior mediastinum which conforms to the underlying aorta measuring 10 x 9 mm on image 22/2, possibly reflecting rebound thymic tissue. Small hiatal hernia. Lungs/Pleura: Similar scarring/atelectasis in the left upper lobe on image 47/4. No new suspicious pulmonary nodules or masses. No pleural effusion. No pneumothorax. Musculoskeletal: No aggressive lytic or blastic lesion of bone. Degenerative change of the spine. CT  ABDOMEN PELVIS FINDINGS Hepatobiliary: No suspicious hepatic lesion. Gallbladder is unremarkable. No biliary ductal dilation. Pancreas: Similar hypodensity in the pancreatic head favored to reflect fatty infiltration. No pancreatic ductal dilation or evidence of acute inflammation. Spleen: No splenomegaly. Adrenals/Urinary Tract: Bilateral adrenal glands appear normal. No hydronephrosis. Kidneys demonstrate symmetric enhancement. Mild wall thickening of the urinary bladder. Stomach/Bowel: Radiopaque enteric contrast material traverses the cecum. Stomach is unremarkable for minimal distension. No pathologic dilation of small or large bowel. Mild wall thickening with a small amount of fluid in the sigmoid colon. Question of an 8 mm enhancing mural nodule in the sigmoid colon on image 111/2 and image 94/5. Vascular/Lymphatic: Scattered aortic atherosclerosis. Smooth IVC contours. No pathologically enlarged abdominal or pelvic lymph nodes. Prominent inguinal lymph nodes are similar prior for instance a left inguinal lymph node measuring 10 mm in short axis on image 119 with preservation of its fatty hilum, unchanged from prior examination. Reproductive: Uterus is surgically absent without new suspicious enhancing nodularity along the vaginal cuff. No suspicious adnexal mass. Other: No significant abdominopelvic free fluid. No discrete peritoneal or omental nodularity. Postsurgical change in the anterior abdominal wall. Musculoskeletal: No aggressive lytic or blastic lesion of bone. Multilevel degenerative changes spine. Degenerative change of the hips. Chronic osseous changes of the pubic symphysis. IMPRESSION: 1. Status post hysterectomy without evidence of local recurrence. 2. No convincing evidence of metastatic disease in the chest, abdomen or pelvis. 3. Question of an 8 mm enhancing mural nodule in the sigmoid colon, suggest further evaluation with colonoscopy. 4. Mild wall thickening of the urinary bladder,  correlate with urinalysis to exclude cystitis. 5. Slightly increased soft tissue  in the anterior mediastinum which conforms to the underlying aorta measuring 10 x 9 mm, possibly reflecting rebound thymic tissue. Suggest continued attention on follow-up imaging. 6.  Aortic Atherosclerosis (ICD10-I70.0). These results will be called to the ordering clinician or representative by the Radiologist Assistant, and communication documented in the PACS or [employer]. Electronically Signed   By: Reyes Holder M.D.   On: 05/26/2023 13:30        PHYSICAL EXAMINATION: ECOG PERFORMANCE STATUS: 1 - Symptomatic but completely ambulatory  Vitals:   07/14/23 0928  BP: (!) 129/99  Pulse: (!) 53  Resp: 18  Temp: 97.7 F (36.5 C)  SpO2: 100%   Filed Weights   07/14/23 0928  Weight: 238 lb 11.2 oz (108.3 kg)    GENERAL:alert, no distress and comfortable  NEURO: alert & oriented x 3 with fluent speech, no focal motor/sensory deficits  LABORATORY DATA:  I have reviewed the data as listed    Component Value Date/Time   NA 141 07/14/2023 0859   K 4.0 07/14/2023 0859   CL 107 07/14/2023 0859   CO2 27 07/14/2023 0859   GLUCOSE 83 07/14/2023 0859   BUN 12 07/14/2023 0859   CREATININE 0.77 07/14/2023 0859   CALCIUM 9.4 07/14/2023 0859   PROT 7.3 07/14/2023 0859   ALBUMIN 4.1 07/14/2023 0859   AST 14 (L) 07/14/2023 0859   ALT 13 07/14/2023 0859   ALKPHOS 109 07/14/2023 0859   BILITOT 0.5 07/14/2023 0859   GFRNONAA >60 07/14/2023 0859    No results found for: SPEP, UPEP  Lab Results  Component Value Date   WBC 3.5 (L) 07/14/2023   NEUTROABS 1.6 (L) 07/14/2023   HGB 11.6 (L) 07/14/2023   HCT 35.9 (L) 07/14/2023   MCV 70.5 (L) 07/14/2023   PLT 236 07/14/2023      Chemistry      Component Value Date/Time   NA 141 07/14/2023 0859   K 4.0 07/14/2023 0859   CL 107 07/14/2023 0859   CO2 27 07/14/2023 0859   BUN 12 07/14/2023 0859   CREATININE 0.77 07/14/2023 0859       Component Value Date/Time   CALCIUM 9.4 07/14/2023 0859   ALKPHOS 109 07/14/2023 0859   AST 14 (L) 07/14/2023 0859   ALT 13 07/14/2023 0859   BILITOT 0.5 07/14/2023 0859

## 2023-07-14 NOTE — Assessment & Plan Note (Signed)
 She tolerated last cycle of treatment well CT imaging is reviewed which showed no evidence of active reoccurrence We will continue immunotherapy Plan to repeat imaging study again next year around May 2025

## 2023-07-14 NOTE — Assessment & Plan Note (Signed)
 She has mild intermittent pancytopenia, nonspecific etiology She is not symptomatic Monitor closely

## 2023-07-15 LAB — T4: T4, Total: 6.5 ug/dL (ref 4.5–12.0)

## 2023-07-16 ENCOUNTER — Other Ambulatory Visit: Payer: Self-pay

## 2023-07-23 ENCOUNTER — Other Ambulatory Visit: Payer: Self-pay

## 2023-08-08 ENCOUNTER — Encounter: Payer: Self-pay | Admitting: Hematology and Oncology

## 2023-08-13 ENCOUNTER — Other Ambulatory Visit: Payer: Self-pay

## 2023-08-18 ENCOUNTER — Encounter: Payer: Self-pay | Admitting: Hematology and Oncology

## 2023-08-25 ENCOUNTER — Other Ambulatory Visit: Payer: Self-pay | Admitting: Hematology and Oncology

## 2023-08-25 ENCOUNTER — Encounter: Payer: Self-pay | Admitting: Hematology and Oncology

## 2023-08-25 ENCOUNTER — Telehealth: Payer: Self-pay

## 2023-08-25 ENCOUNTER — Inpatient Hospital Stay: Payer: Self-pay

## 2023-08-25 ENCOUNTER — Inpatient Hospital Stay (HOSPITAL_BASED_OUTPATIENT_CLINIC_OR_DEPARTMENT_OTHER): Payer: Medicare PPO | Admitting: Hematology and Oncology

## 2023-08-25 ENCOUNTER — Inpatient Hospital Stay: Payer: Medicare PPO | Attending: Gynecologic Oncology

## 2023-08-25 VITALS — BP 108/75 | HR 58 | Temp 97.5°F | Resp 18 | Ht 63.0 in | Wt 244.6 lb

## 2023-08-25 DIAGNOSIS — C55 Malignant neoplasm of uterus, part unspecified: Secondary | ICD-10-CM | POA: Insufficient documentation

## 2023-08-25 DIAGNOSIS — E039 Hypothyroidism, unspecified: Secondary | ICD-10-CM

## 2023-08-25 DIAGNOSIS — Z5112 Encounter for antineoplastic immunotherapy: Secondary | ICD-10-CM | POA: Diagnosis present

## 2023-08-25 DIAGNOSIS — E538 Deficiency of other specified B group vitamins: Secondary | ICD-10-CM | POA: Insufficient documentation

## 2023-08-25 DIAGNOSIS — Z90722 Acquired absence of ovaries, bilateral: Secondary | ICD-10-CM | POA: Diagnosis not present

## 2023-08-25 DIAGNOSIS — Z90710 Acquired absence of both cervix and uterus: Secondary | ICD-10-CM | POA: Diagnosis not present

## 2023-08-25 DIAGNOSIS — D61818 Other pancytopenia: Secondary | ICD-10-CM | POA: Diagnosis not present

## 2023-08-25 DIAGNOSIS — Z9079 Acquired absence of other genital organ(s): Secondary | ICD-10-CM | POA: Diagnosis not present

## 2023-08-25 DIAGNOSIS — C7800 Secondary malignant neoplasm of unspecified lung: Secondary | ICD-10-CM | POA: Diagnosis present

## 2023-08-25 LAB — CBC WITH DIFFERENTIAL (CANCER CENTER ONLY)
Abs Immature Granulocytes: 0.01 10*3/uL (ref 0.00–0.07)
Basophils Absolute: 0.1 10*3/uL (ref 0.0–0.1)
Basophils Relative: 2 %
Eosinophils Absolute: 0.2 10*3/uL (ref 0.0–0.5)
Eosinophils Relative: 7 %
HCT: 36.9 % (ref 36.0–46.0)
Hemoglobin: 11.4 g/dL — ABNORMAL LOW (ref 12.0–15.0)
Immature Granulocytes: 0 %
Lymphocytes Relative: 30 %
Lymphs Abs: 1 10*3/uL (ref 0.7–4.0)
MCH: 22.4 pg — ABNORMAL LOW (ref 26.0–34.0)
MCHC: 30.9 g/dL (ref 30.0–36.0)
MCV: 72.4 fL — ABNORMAL LOW (ref 80.0–100.0)
Monocytes Absolute: 0.3 10*3/uL (ref 0.1–1.0)
Monocytes Relative: 10 %
Neutro Abs: 1.7 10*3/uL (ref 1.7–7.7)
Neutrophils Relative %: 51 %
Platelet Count: 237 10*3/uL (ref 150–400)
RBC: 5.1 MIL/uL (ref 3.87–5.11)
RDW: 17.3 % — ABNORMAL HIGH (ref 11.5–15.5)
WBC Count: 3.3 10*3/uL — ABNORMAL LOW (ref 4.0–10.5)
nRBC: 0 % (ref 0.0–0.2)

## 2023-08-25 LAB — CMP (CANCER CENTER ONLY)
ALT: 14 U/L (ref 0–44)
AST: 13 U/L — ABNORMAL LOW (ref 15–41)
Albumin: 4 g/dL (ref 3.5–5.0)
Alkaline Phosphatase: 102 U/L (ref 38–126)
Anion gap: 4 — ABNORMAL LOW (ref 5–15)
BUN: 14 mg/dL (ref 8–23)
CO2: 29 mmol/L (ref 22–32)
Calcium: 9.3 mg/dL (ref 8.9–10.3)
Chloride: 106 mmol/L (ref 98–111)
Creatinine: 0.75 mg/dL (ref 0.44–1.00)
GFR, Estimated: >60 mL/min (ref >60)
Glucose, Bld: 84 mg/dL (ref 70–99)
Potassium: 3.9 mmol/L (ref 3.5–5.1)
Sodium: 139 mmol/L (ref 135–145)
Total Bilirubin: 0.6 mg/dL (ref 0.0–1.2)
Total Protein: 7.2 g/dL (ref 6.5–8.1)

## 2023-08-25 LAB — TSH: TSH: 3.886 u[IU]/mL (ref 0.350–4.500)

## 2023-08-25 MED ORDER — SODIUM CHLORIDE 0.9 % IV SOLN
400.0000 mg | Freq: Once | INTRAVENOUS | Status: AC
  Administered 2023-08-25: 400 mg via INTRAVENOUS
  Filled 2023-08-25: qty 16

## 2023-08-25 MED ORDER — CYANOCOBALAMIN 1000 MCG/ML IJ SOLN
1000.0000 ug | Freq: Once | INTRAMUSCULAR | Status: AC
  Administered 2023-08-25: 1000 ug via INTRAMUSCULAR
  Filled 2023-08-25: qty 1

## 2023-08-25 MED ORDER — LEVOTHYROXINE SODIUM 150 MCG PO TABS
150.0000 ug | ORAL_TABLET | Freq: Every day | ORAL | 1 refills | Status: DC
Start: 2023-08-25 — End: 2023-10-09

## 2023-08-25 MED ORDER — HEPARIN SOD (PORK) LOCK FLUSH 100 UNIT/ML IV SOLN
500.0000 [IU] | Freq: Once | INTRAVENOUS | Status: AC | PRN
  Administered 2023-08-25: 500 [IU]

## 2023-08-25 MED ORDER — SODIUM CHLORIDE 0.9% FLUSH
10.0000 mL | Freq: Once | INTRAVENOUS | Status: AC
  Administered 2023-08-25: 10 mL

## 2023-08-25 MED ORDER — SODIUM CHLORIDE 0.9% FLUSH
10.0000 mL | INTRAVENOUS | Status: DC | PRN
  Administered 2023-08-25: 10 mL

## 2023-08-25 MED ORDER — SODIUM CHLORIDE 0.9 % IV SOLN: Freq: Once | INTRAVENOUS | Status: AC

## 2023-08-25 NOTE — Progress Notes (Signed)
 [facility] OFFICE PROGRESS NOTE  Patient Care Team: Pcp, No as PCP - General  ASSESSMENT & PLAN:  Uterine cancer (HCC) Candice Parks tolerated last cycle of treatment well CT imaging from Dec 2024 showed no evidence of active reoccurrence We will continue immunotherapy Plan to repeat imaging study again next year around May 2025  Pancytopenia, acquired [facility]) Candice Parks has mild intermittent pancytopenia, component of B12 deficiency Candice Parks is not symptomatic We will proceed with treatment along with B12 injections  Hypothyroidism Candice Parks has gained a lot of weight recently Candice Parks stated Candice Parks is compliant taking her Synthroid Repeat TSH is pending I will adjust the dose of Synthroid as needed  Orders Placed This Encounter  Procedures   CBC with Differential (Cancer Center Only)    Standing Status:   Future    Expected Date:   10/06/2023    Expiration Date:   10/05/2024   CMP (Cancer Center only)    Standing Status:   Future    Expected Date:   10/06/2023    Expiration Date:   10/05/2024   T4    Standing Status:   Future    Expected Date:   10/06/2023    Expiration Date:   10/05/2024   TSH    Standing Status:   Future    Expected Date:   10/06/2023    Expiration Date:   10/05/2024    All questions were answered. The patient knows to call the clinic with any problems, questions or concerns. The total time spent in the appointment was 20 minutes encounter with patients including review of chart and various tests results, discussions about plan of care and coordination of care plan   Artis Delay, MD 08/25/2023 8:38 AM  INTERVAL HISTORY: Please see below for problem oriented charting. Candice Parks returns for chemo follow-up Candice Parks is doing well Candice Parks denies side effects of treatment Denies abnormal pelvic pain no discharge  REVIEW OF SYSTEMS:   Constitutional: Denies fevers, chills or abnormal weight loss Eyes: Denies blurriness of vision Ears, nose, mouth, throat, and face: Denies mucositis or sore  throat Respiratory: Denies cough, dyspnea or wheezes Cardiovascular: Denies palpitation, chest discomfort or lower extremity swelling Gastrointestinal:  Denies nausea, heartburn or change in bowel habits Skin: Denies abnormal skin rashes Lymphatics: Denies new lymphadenopathy or easy bruising Neurological:Denies numbness, tingling or new weaknesses Behavioral/Psych: Mood is stable, no new changes  All other systems were reviewed with the patient and are negative.  I have reviewed the past medical history, past surgical history, social history and family history with the patient and they are unchanged from previous note.  ALLERGIES:  has no known allergies.  MEDICATIONS:  Current Outpatient Medications  Medication Sig Dispense Refill   acetaminophen (TYLENOL) 325 MG tablet Take 650 mg by mouth at bedtime as needed (pain).     levothyroxine (SYNTHROID) 150 MCG tablet Take 1 tablet (150 mcg total) by mouth daily before breakfast. 30 tablet 1   magnesium hydroxide (MILK OF MAGNESIA) 400 MG/5ML suspension Take 30 mLs by mouth daily.     polyethylene glycol (MIRALAX / GLYCOLAX) 17 g packet Take 17 g by mouth daily. (Patient taking differently: Take 17 g by mouth daily as needed for mild constipation.) 30 each 1   No current facility-administered medications for this visit.    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   Uterine cancer (HCC)  01/05/2021 Imaging   Outside CT imaging  Bilateral hydronephrosis related extrinsic compression on the ureters by the enlarged uterus.   Multiple calcified uterine fibroids although peripherally enhancing diffuse decreased attenuation is noted centrally within the uterus highly  suspicious for endometrial neoplasm given the current history of vaginal bleeding. Further evaluation is recommended. MRI may be helpful. Additionally tissue sampling would likely be helpful.   Hypodensity in the pancreas at the junction of the head and body of the pancreas. This may be related to volume averaging although the possibility of a cystic lesion could not be totally excluded. MRI is recommended for further evaluation.    01/05/2021 Initial Diagnosis   The patient reported at least 1 year of postmenopausal bleeding.  Candice Parks does not have a primary care provider or gynecologist.  Candice Parks had known fibroids and had noted that Candice Parks needed a hysterectomy but had not gotten around to seeking care for this.  Candice Parks presented to the [facility] emergency department on January 05, 2021 with new onset right lower quadrant pain and this prompted a CT scan of the abdomen and pelvis to be performed on 01/05/2021.     01/14/2021 Pathology Results   FINAL MICROSCOPIC DIAGNOSIS:   A. CERVIX, ENDOCERVIX, BIOPSY:  -  Endometrioid carcinoma, FIGO grade 1  -  See comment    01/26/2021 Imaging   MRI abdomen  1. No suspicious pancreatic mass identified. 2. Moderate to severe bilateral hydroureteronephrosis, similar to recent CT the abdomen and pelvis, presumably related to extrinsic ureteral compression from the patient's enlarged uterus, as demonstrated on recent CT the abdomen and pelvis 01/05/2021.   02/01/2021 Surgery   Surgery in [facility]  Date of Service: February 01, 2021 3:13 PM  Preoperative Diagnoses: Endometrial cancer  Postoperative Diagnoses: Endometrial cancer  Procedures: Total abdominal hysterectomy, bilateral salpingo-oophorectomy, left pelvic lymphadenectomy, right para-aortic lymphadenectomy  Surgeon: Adolphus Birchwood, MD  Findings: Tumor appreciated in the cervical canal. Large, broad, multi fibroid uterus with a calcified fibroid filling the posterior cul de sac, an anterior lower uterine segment fibroid, and  bilateral fibroids near the level of the cardinal ligament. No evidence of intraperitoneal disease. Normal appearing bilateral tubes and ovaries. Bilateral hydroureter from uterine compression. Tumor excreted from specimen into the pelvis at time of removal. An approximately 2cm enlarged left pelvic lymph node and a 1cm enlarged right aorta caval lymph node were identified.     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) - Right ovary, negative for tumor - Bilateral fallopian tubes, negative for tumor - Multiple leiomyomata (up to 8.5 cm)   B: Lymph node, aortocaval, excision - Metastatic carcinoma involving 1 lymph node (0.4 cm) (1/1)   C: Lymph node, left pelvic, excision -Metastatic carcinoma involving 1 lymph node (0.3 cm) (1/1)   Diagnosis Comment    The majority of this tumor is composed of well differentiated (overall grade 2, but with areas consistent with grade 1) endometrioid adenocarcinoma with squamous and mucinous differentiation (approximately 70%); multifocally there is abrupt transition to an undifferentiated malignancy with a high-grade monotonous noncohesive appearance (best seen in slide  A2) consistent with undifferentiated carcinoma.  The overall morphologic and immunophenotypic findings are consistent with a dedifferentiated endometrial carcinoma.   Of note, the carcinoma present in the lymph nodes is well differentiated endometrioid carcinoma and the metastatic deposit in the ovary is undifferentiated carcinoma. Carcinoma is present less than 1 mm from uterine corpus serosa (in the lower uterine segment).  The assessment of the  paracervical soft tissue margins is challenging due to the disrupted nature of the specimen in this area, however tumor appears present less than 1 mm from paracervical soft tissue.  No definite parametrial invasion is identified.   Immunohistochemistry performed at [facility] (with appropriate controls) demonstrates the following immunoprofile in cells of interest: Well differentiated endometrioid carcinoma: CK AE1/AE3: Positive ER: Positive (>90%, strong staining) P53: Favor wild-type (see comment below)   Dedifferentiated carcinoma: CK AE1/AE3: Very rare cells, predominantly negative ER: Negative P53: Small focus of mutant type staining in a background of wild-type staining (see comment below)   P53 staining comment: In the well differentiated carcinoma component the staining pattern is predominantly wild-type although focally there is slightly increased levels of staining noted.  In the dedifferentiated tumor component, most of the tissue exhibits wild-type pattern staining, however there is a distinct small area that exhibits increased/overexpressed mutant pattern p53 staining.    Immunohistochemistry testing for mismatch repair (MMR) proteins is performed on block A2:   MLH1:  Loss of nuclear expression MSH2:  Intact nuclear expression MSH6:  Intact nuclear expression PMS2:  Loss of nuclear expression   Loss of nuclear expression of MLH1 and PMS2 can be a result of sporadic microsatellite instability (via hypermethylation of the MLH1 promoter region) or due to Lynch syndrome / HNPCC. Materials will be submitted to the Molecular [facility]ology Laboratory [id number]) for [facility] promoter hypermethylation testing. These results will be issued in a separate report.      03/05/2021 Initial Diagnosis   Uterine cancer (HCC)   03/05/2021 Cancer Staging   Staging form: Corpus Uteri - Carcinoma and Carcinosarcoma, AJCC 8th Edition - Pathologic stage from 03/05/2021: FIGO Stage IVB (rpT3, pN2a, cM1) -  Signed by Artis Delay, MD on 11/12/2021 Stage prefix: Recurrence   03/11/2021 Imaging   1. Status post interval hysterectomy and oophorectomy. 2. There is a rim enhancing soft tissue mass centered in the vagina, measuring approximately 4.4 x 3.1 cm. This is new, or significantly enlarged compared to prior examination dated 01/05/2021. Findings are concerning for malignant involvement. Correlate with physical examination. Contrast enhanced MRI may be helpful to further detail soft tissue anatomy of the low pelvis if indicated. 3. There is at least one newly enlarged left iliac lymph node measuring up to 1.4 x 1.3 cm, nonspecific although concerning for nodal metastatic disease. 4. Fluid attenuation lymphocele or seroma adjacent to surgical clip at the level right ovarian vein. 5. Status post interval midline laparotomy. Subcutaneous fluid within the inferior aspect of the wound. The presence or absence of infection is not established by CT.     03/23/2021 - 07/09/2021 Chemotherapy   Patient is on Treatment Plan : UTERINE Carboplatin AUC 6 / Paclitaxel q21d     03/23/2021 Procedure   Placement of a subcutaneous power-injectable port device. Catheter tip at the superior cavoatrial junction   07/30/2021 Imaging   1. Large ill-defined partially enhancing and likely centrally necrotic vaginal and paravaginal mass. Recommend correlation with direct visualization. MRI pelvis without and with contrast may be helpful for further evaluation. 2. No findings suspicious for omental or peritoneal  surface disease. No adenopathy.   08/09/2021 - 09/08/2021 Radiation Therapy   Indication for treatment:  Curative       Radiation treatment dates:   08/09/21 through 09/08/21 Site/dose:  IMRT Uterus; Vagina : 41.5 Gy delivered in 23 Fx with 1.80 Gy/Fx (Planned dosage was for 45.00 GY delivered in 25 Fx with 1.80 Gy/Fx, her final 2 treatments (and boost) was canceled secondary to significant radiation reaction)    Beams/energy: 6X   11/11/2021 Imaging   1. 11 mm left upper lobe pulmonary nodule in the apicoposterior segment abuts the posterior pleural margin. In this clinical context the appearance is very concerning for a metastatic lesion although we do not have prior cross-sectional imaging to assess stability. Proximity to the fifth rib and adjacent major fissure might make percutaneous biopsy tricky; PET-CT would also be one way of assessing the metabolic activity/malignant potential of this lesion. This may be a good candidate case for discussion at multidisciplinary cancer conference. 2. Increased adenopathy along the left iliac chain, including an internal iliac node which abuts the left sacral plexus. 3. The lower vaginal and vestibular mass measures about 7.0 by 5.5 by 5.9 cm and has some internal gas which may indicate ulceration along the vestibule/perineum. Poor definition of tissue planes between this process in the base of the urinary bladder as well as the anus. The mass extends along the left anterior obturator internus muscle. 4. Other imaging findings of potential clinical significance: Prominent main pulmonary artery, cannot exclude pulmonary arterial hypertension. Aortic Atherosclerosis (ICD10-I70.0). Lower lumbar spondylosis and degenerative disc disease.   11/19/2021 - 03/11/2022 Chemotherapy   Patient is on Treatment Plan : UTERINE Pembrolizumab (200) q21d     11/19/2021 -  Chemotherapy   Patient is on Treatment Plan : UTERINE Pembrolizumab (400) q42d     01/21/2022 Imaging   1. Status post hysterectomy. Generally diminished size of an ulcerated mass involving the vagina when compared to prior examination, difficult to accurately measure due to poor resolution of adjacent soft tissues. 2. Significant interval decrease in size of left pelvic sidewall and iliac lymphadenopathy. 3. Interval enlargement of a pulmonary nodule of the posterior left upper lobe. 4. Constellation of findings is  consistent with mixed response to treatment, with treatment response of primary tumor and nodal metastatic disease but with clear evidence of an enlarging pulmonary metastasis. 5. New, diffuse urinary bladder wall thickening and adjacent fat stranding, consistent with nonspecific infectious or inflammatory cystitis, most likely related to local radiation therapy.   04/22/2022 Imaging   1. Resolution of previously noted soft tissue mass associated with the apex of the vagina. 2. Regression of previously noted left upper lobe pulmonary nodule. However, there are multiple new satellite nodules in the adjacent lung parenchyma in the upper left lung, largest of which measures 1.2 x 0.6 cm in the superior segment of the left lower lobe. Close attention on follow-up studies is recommended. 3. Interval development of mildly enlarged bilateral inguinal lymph nodes. These could be reactive, however, the possibility of locoregional nodal metastases is not excluded, and close attention on follow-up imaging is recommended to ensure regression of these findings. 4. Aortic atherosclerosis, in addition to left anterior descending coronary artery disease. Assessment for potential risk factor modification, dietary therapy or pharmacologic therapy may be warranted, if clinically indicated.  5. Additional incidental findings, as above.   08/29/2022 Imaging   1. Previously noted clustered nodularity in the upper left lung has resolved compared to the prior study. However, today's  study demonstrates a new mass-like area of architectural distortion with some internal air bronchograms and cavitation in the posterior aspect of the left upper lobe in the same region. Although this is mass-like in appearance on axial images, this is rather linear in appearance on coronal and sagittal reconstructions. If there has been radiation therapy to this region, this could simply represent an area of evolving postradiation mass-like fibrosis.  In the absence of a history of radiation therapy, these findings are presumably of infectious etiology. Neoplasm is not favored, but difficult to entirely exclude. Close attention on follow-up studies is recommended to evaluate for the evolution of these findings. No other suspicious appearing pulmonary nodules or masses are noted on today's examination. 2. No definite signs of metastatic disease noted in the abdomen or pelvis. 3. Multiple prominent borderline enlarged and minimally enlarged bilateral inguinal lymph nodes, slightly less prominent than the prior examination. Continued attention on follow-up studies is recommended. 4. Severe circumferential thickening of the urinary bladder wall without discrete bladder wall mass. This is of uncertain etiology and significance, but correlation with urinalysis is recommended to exclude the possibility of cystitis. Attention on follow-up imaging is recommended to ensure resolution. 5. Aortic atherosclerosis, in addition to left anterior descending coronary artery disease. Please note that although the presence of coronary artery calcium documents the presence of coronary artery disease, the severity of this disease and any potential stenosis cannot be assessed on this non-gated CT examination. Assessment for potential risk factor modification, dietary therapy or pharmacologic therapy may be warranted, if clinically indicated. 6. Additional incidental findings, as above.     12/23/2022 Imaging   CT ABDOMEN PELVIS W CONTRAST  Result Date: 12/22/2022 CLINICAL DATA:  Cervical cancer; * Tracking Code: BO * EXAM: CT ABDOMEN AND PELVIS WITH CONTRAST TECHNIQUE: Multidetector CT imaging of the abdomen and pelvis was performed using the standard protocol following bolus administration of intravenous contrast. RADIATION DOSE REDUCTION: This exam was performed according to the departmental dose-optimization program which includes automated exposure control, adjustment of  the mA and/or kV according to patient size and/or use of iterative reconstruction technique. CONTRAST:  OMNIPAQUE IOHEXOL 300 MG/ML  SOLN COMPARISON:  CT chest, abdomen and pelvis dated August 29, 2022 FINDINGS: Lower chest: Small hiatal hernia.  No acute abnormality. Hepatobiliary: No focal liver abnormality is seen. No gallstones, gallbladder wall thickening, or biliary dilatation. Pancreas: Relative hypoattenuation of the pancreatic head, unchanged when compared with multiple priors and likely due to focal fatty atrophy. No pancreatic ductal dilatation or surrounding inflammatory changes. Spleen: Normal in size without focal abnormality. Adrenals/Urinary Tract: Bilateral adrenal glands are unremarkable. Hydronephrosis or nephrolithiasis. Bladder is unremarkable. Stomach/Bowel: Stomach is within normal limits. Appendix appears normal. No evidence of bowel wall thickening, distention, or inflammatory changes. Vascular/Lymphatic: Aortic atherosclerosis. No enlarged abdominal or pelvic lymph nodes. Reproductive: Status post hysterectomy. No adnexal masses. Other: No abdominal wall hernia or abnormality. No abdominopelvic ascites. Musculoskeletal: No acute or significant osseous findings. IMPRESSION: 1. No evidence of metastatic disease in the abdomen or pelvis. 2. Stable prominent bilateral inguinal lymph nodes. 3. Aortic Atherosclerosis (ICD10-I70.0). Electronically Signed   By: Allegra Lai M.D.   On: 12/22/2022 15:38      05/26/2023 Imaging   CT CHEST ABDOMEN PELVIS W CONTRAST  Result Date: 05/26/2023 CLINICAL DATA:  History of cervical cancer, follow-up. * Tracking Code: BO * EXAM: CT CHEST, ABDOMEN, AND PELVIS WITH CONTRAST TECHNIQUE: Multidetector CT imaging of the chest, abdomen and pelvis was performed following the  standard protocol during bolus administration of intravenous contrast. RADIATION DOSE REDUCTION: This exam was performed according to the departmental dose-optimization program  which includes automated exposure control, adjustment of the mA and/or kV according to patient size and/or use of iterative reconstruction technique. CONTRAST:  OMNIPAQUE IOHEXOL 300 MG/ML  SOLN COMPARISON:  Multiple priors including most recent CT December 21, 2022 and August 29, 2022. FINDINGS: CT CHEST FINDINGS Cardiovascular: Accessed right chest Port-A-Cath with tip near the superior cavoatrial junction. Aortic atherosclerosis. Normal caliber thoracic aorta. No central pulmonary embolus on this nondedicated study. Normal size heart. No significant pericardial effusion/thickening. Mediastinum/Nodes: No suspicious thyroid nodule. No pathologically enlarged mediastinal, hilar or axillary lymph nodes. Slightly increased soft tissue in the anterior mediastinum which conforms to the underlying aorta measuring 10 x 9 mm on image 22/2, possibly reflecting rebound thymic tissue. Small hiatal hernia. Lungs/Pleura: Similar scarring/atelectasis in the left upper lobe on image 47/4. No new suspicious pulmonary nodules or masses. No pleural effusion. No pneumothorax. Musculoskeletal: No aggressive lytic or blastic lesion of bone. Degenerative change of the spine. CT ABDOMEN PELVIS FINDINGS Hepatobiliary: No suspicious hepatic lesion. Gallbladder is unremarkable. No biliary ductal dilation. Pancreas: Similar hypodensity in the pancreatic head favored to reflect fatty infiltration. No pancreatic ductal dilation or evidence of acute inflammation. Spleen: No splenomegaly. Adrenals/Urinary Tract: Bilateral adrenal glands appear normal. No hydronephrosis. Kidneys demonstrate symmetric enhancement. Mild wall thickening of the urinary bladder. Stomach/Bowel: Radiopaque enteric contrast material traverses the cecum. Stomach is unremarkable for minimal distension. No pathologic dilation of small or large bowel. Mild wall thickening with a small amount of fluid in the sigmoid colon. Question of an 8 mm enhancing mural nodule in the  sigmoid colon on image 111/2 and image 94/5. Vascular/Lymphatic: Scattered aortic atherosclerosis. Smooth IVC contours. No pathologically enlarged abdominal or pelvic lymph nodes. Prominent inguinal lymph nodes are similar prior for instance a left inguinal lymph node measuring 10 mm in short axis on image 119 with preservation of its fatty hilum, unchanged from prior examination. Reproductive: Uterus is surgically absent without new suspicious enhancing nodularity along the vaginal cuff. No suspicious adnexal mass. Other: No significant abdominopelvic free fluid. No discrete peritoneal or omental nodularity. Postsurgical change in the anterior abdominal wall. Musculoskeletal: No aggressive lytic or blastic lesion of bone. Multilevel degenerative changes spine. Degenerative change of the hips. Chronic osseous changes of the pubic symphysis. IMPRESSION: 1. Status post hysterectomy without evidence of local recurrence. 2. No convincing evidence of metastatic disease in the chest, abdomen or pelvis. 3. Question of an 8 mm enhancing mural nodule in the sigmoid colon, suggest further evaluation with colonoscopy. 4. Mild wall thickening of the urinary bladder, correlate with urinalysis to exclude cystitis. 5. Slightly increased soft tissue in the anterior mediastinum which conforms to the underlying aorta measuring 10 x 9 mm, possibly reflecting rebound thymic tissue. Suggest continued attention on follow-up imaging. 6.  Aortic Atherosclerosis (ICD10-I70.0). These results will be called to the ordering clinician or representative by the Radiologist Assistant, and communication documented in the PACS or [employer]. Electronically Signed   By: Maudry Mayhew M.D.   On: 05/26/2023 13:30        PHYSICAL EXAMINATION: ECOG PERFORMANCE STATUS: 0 - Asymptomatic  Vitals:   08/25/23 0807  BP: 108/75  Pulse: (!) 58  Resp: 18  Temp: (!) 97.5 F (36.4 C)  SpO2: 100%   Filed Weights   08/25/23 0807  Weight: 244  lb 9.6 oz (110.9 kg)    GENERAL:alert,  no distress and comfortable LABORATORY DATA:  I have reviewed the data as listed    Component Value Date/Time   NA 139 08/25/2023 0730   K 3.9 08/25/2023 0730   CL 106 08/25/2023 0730   CO2 29 08/25/2023 0730   GLUCOSE 84 08/25/2023 0730   BUN 14 08/25/2023 0730   CREATININE 0.75 08/25/2023 0730   CALCIUM 9.3 08/25/2023 0730   PROT 7.2 08/25/2023 0730   ALBUMIN 4.0 08/25/2023 0730   AST 13 (L) 08/25/2023 0730   ALT 14 08/25/2023 0730   ALKPHOS 102 08/25/2023 0730   BILITOT 0.6 08/25/2023 0730   GFRNONAA >60 08/25/2023 0730    No results found for: "SPEP", "UPEP"  Lab Results  Component Value Date   WBC 3.3 (L) 08/25/2023   NEUTROABS 1.7 08/25/2023   HGB 11.4 (L) 08/25/2023   HCT 36.9 08/25/2023   MCV 72.4 (L) 08/25/2023   PLT 237 08/25/2023      Chemistry      Component Value Date/Time   NA 139 08/25/2023 0730   K 3.9 08/25/2023 0730   CL 106 08/25/2023 0730   CO2 29 08/25/2023 0730   BUN 14 08/25/2023 0730   CREATININE 0.75 08/25/2023 0730      Component Value Date/Time   CALCIUM 9.3 08/25/2023 0730   ALKPHOS 102 08/25/2023 0730   AST 13 (L) 08/25/2023 0730   ALT 14 08/25/2023 0730   BILITOT 0.6 08/25/2023 0730

## 2023-08-25 NOTE — Assessment & Plan Note (Signed)
She has mild intermittent pancytopenia, component of B12 deficiency She is not symptomatic We will proceed with treatment along with B12 injections

## 2023-08-25 NOTE — Telephone Encounter (Signed)
-----   Message from Artis Delay sent at 08/25/2023  3:09 PM EST ----- Pls let her know TSH is ok I refilled her prescription to her local pharmacy

## 2023-08-25 NOTE — Telephone Encounter (Signed)
Called and left below message. Ask her to call the office with questions.

## 2023-08-25 NOTE — Patient Instructions (Signed)

## 2023-08-25 NOTE — Assessment & Plan Note (Signed)
She has gained a lot of weight recently She stated she is compliant taking her Synthroid Repeat TSH is pending I will adjust the dose of Synthroid as needed

## 2023-08-25 NOTE — Assessment & Plan Note (Signed)
She tolerated last cycle of treatment well CT imaging from Dec 2024 showed no evidence of active reoccurrence We will continue immunotherapy Plan to repeat imaging study again next year around May 2025

## 2023-08-26 ENCOUNTER — Other Ambulatory Visit: Payer: Self-pay

## 2023-08-27 LAB — T4: T4, Total: 10.6 ug/dL (ref 4.5–12.0)

## 2023-09-04 ENCOUNTER — Other Ambulatory Visit: Payer: Self-pay | Admitting: Hematology and Oncology

## 2023-09-11 ENCOUNTER — Other Ambulatory Visit: Payer: Self-pay

## 2023-10-06 ENCOUNTER — Inpatient Hospital Stay: Payer: Medicare PPO | Attending: Gynecologic Oncology

## 2023-10-06 ENCOUNTER — Encounter: Payer: Self-pay | Admitting: Hematology and Oncology

## 2023-10-06 ENCOUNTER — Inpatient Hospital Stay: Payer: Medicare PPO | Attending: Gynecologic Oncology | Admitting: Hematology and Oncology

## 2023-10-06 ENCOUNTER — Telehealth: Payer: Self-pay | Admitting: Licensed Clinical Social Worker

## 2023-10-06 ENCOUNTER — Inpatient Hospital Stay: Payer: Medicare PPO

## 2023-10-06 VITALS — BP 143/73 | HR 86 | Resp 18 | Ht 63.0 in

## 2023-10-06 DIAGNOSIS — Z599 Problem related to housing and economic circumstances, unspecified: Secondary | ICD-10-CM | POA: Diagnosis not present

## 2023-10-06 DIAGNOSIS — C55 Malignant neoplasm of uterus, part unspecified: Secondary | ICD-10-CM | POA: Insufficient documentation

## 2023-10-06 DIAGNOSIS — E538 Deficiency of other specified B group vitamins: Secondary | ICD-10-CM | POA: Diagnosis not present

## 2023-10-06 DIAGNOSIS — E039 Hypothyroidism, unspecified: Secondary | ICD-10-CM | POA: Diagnosis not present

## 2023-10-06 DIAGNOSIS — C7800 Secondary malignant neoplasm of unspecified lung: Secondary | ICD-10-CM | POA: Insufficient documentation

## 2023-10-06 DIAGNOSIS — Z923 Personal history of irradiation: Secondary | ICD-10-CM | POA: Insufficient documentation

## 2023-10-06 DIAGNOSIS — Z9221 Personal history of antineoplastic chemotherapy: Secondary | ICD-10-CM | POA: Insufficient documentation

## 2023-10-06 LAB — CBC WITH DIFFERENTIAL (CANCER CENTER ONLY)
Abs Immature Granulocytes: 0.01 10*3/uL (ref 0.00–0.07)
Basophils Absolute: 0.1 10*3/uL (ref 0.0–0.1)
Basophils Relative: 1 %
Eosinophils Absolute: 0.2 10*3/uL (ref 0.0–0.5)
Eosinophils Relative: 3 %
HCT: 33.9 % — ABNORMAL LOW (ref 36.0–46.0)
Hemoglobin: 10.8 g/dL — ABNORMAL LOW (ref 12.0–15.0)
Immature Granulocytes: 0 %
Lymphocytes Relative: 17 %
Lymphs Abs: 1 10*3/uL (ref 0.7–4.0)
MCH: 22.6 pg — ABNORMAL LOW (ref 26.0–34.0)
MCHC: 31.9 g/dL (ref 30.0–36.0)
MCV: 71.1 fL — ABNORMAL LOW (ref 80.0–100.0)
Monocytes Absolute: 0.4 10*3/uL (ref 0.1–1.0)
Monocytes Relative: 7 %
Neutro Abs: 4.3 10*3/uL (ref 1.7–7.7)
Neutrophils Relative %: 72 %
Platelet Count: 330 10*3/uL (ref 150–400)
RBC: 4.77 MIL/uL (ref 3.87–5.11)
RDW: 15.8 % — ABNORMAL HIGH (ref 11.5–15.5)
WBC Count: 6 10*3/uL (ref 4.0–10.5)
nRBC: 0 % (ref 0.0–0.2)

## 2023-10-06 LAB — CMP (CANCER CENTER ONLY)
ALT: 25 U/L (ref 0–44)
AST: 17 U/L (ref 15–41)
Albumin: 3.8 g/dL (ref 3.5–5.0)
Alkaline Phosphatase: 114 U/L (ref 38–126)
Anion gap: 7 (ref 5–15)
BUN: 10 mg/dL (ref 8–23)
CO2: 27 mmol/L (ref 22–32)
Calcium: 9.2 mg/dL (ref 8.9–10.3)
Chloride: 105 mmol/L (ref 98–111)
Creatinine: 0.75 mg/dL (ref 0.44–1.00)
GFR, Estimated: >60 mL/min (ref >60)
Glucose, Bld: 89 mg/dL (ref 70–99)
Potassium: 4 mmol/L (ref 3.5–5.1)
Sodium: 139 mmol/L (ref 135–145)
Total Bilirubin: 0.4 mg/dL (ref 0.0–1.2)
Total Protein: 7.6 g/dL (ref 6.5–8.1)

## 2023-10-06 LAB — TSH: TSH: 24.938 u[IU]/mL — ABNORMAL HIGH (ref 0.350–4.500)

## 2023-10-06 MED ORDER — SODIUM CHLORIDE 0.9% FLUSH
10.0000 mL | Freq: Once | INTRAVENOUS | Status: AC
  Administered 2023-10-06: 10 mL

## 2023-10-06 MED ORDER — CYANOCOBALAMIN 1000 MCG/ML IJ SOLN
1000.0000 ug | Freq: Once | INTRAMUSCULAR | Status: DC
Start: 2023-10-06 — End: 2023-10-06

## 2023-10-06 NOTE — Telephone Encounter (Signed)
 [facility] Clinical Social Work  Clinical Social Work was referred by Engineer, civil (consulting) for financial concerns.  Clinical Social Worker attempted to contact patient by phone to offer support and assess for needs.   No answer. Left VM with direct contact information as well as the phone number for billing to discuss potential qualification for [facility] financial assistance.     Verdie Wilms E Omarii Scalzo, LCSW  Clinical Social Worker [facility]

## 2023-10-06 NOTE — Assessment & Plan Note (Addendum)
 The patient is told she has to meet very high co-pay/deductible this year before she would qualify for reduced payment She cannot afford her treatment today We will get assistance from a social worker and financial department to see we can get her help to pay for her treatment

## 2023-10-06 NOTE — Assessment & Plan Note (Addendum)
 Repeat TSH is pending I will adjust the dose of Synthroid as needed

## 2023-10-06 NOTE — Assessment & Plan Note (Addendum)
 We will omit B12 injection today She is taking oral vitamin B12 supplement

## 2023-10-06 NOTE — Progress Notes (Signed)
 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 and recurrent disease in her pelvis and abdominal lymphadenopathy, treated with pembrolizumab with complete response seen by February 2024  She tolerated pembrolizumab very well with no side effects The issue is that she cannot afford co-pay/deductible with her insurance this year We will put her treatment on hold and will get social worker to identify resources in our financial department to see if we can help her apply for assistance to pay for her treatment  Financial difficulties The patient is told she has to meet very high co-pay/deductible this year before she would qualify for reduced payment She cannot afford her treatment today We will get assistance from a social worker and financial department to see we can get her help to pay for her treatment Vitamin B12 deficiency We will omit B12 injection today She is taking oral vitamin B12 supplement Hypothyroidism, unspecified type Repeat TSH is pending I will adjust the dose of Synthroid as needed  Orders Placed This Encounter  Procedures   CBC with Differential (Cancer Center Only)    Standing Status:   Future    Expected Date:   11/17/2023    Expiration Date:   11/16/2024   CMP (Cancer Center only)    Standing Status:   Future    Expected Date:   11/17/2023    Expiration Date:   11/16/2024   T4    Standing Status:    Future    Expected Date:   11/17/2023    Expiration Date:   11/16/2024   TSH    Standing Status:   Future    Expected Date:   11/17/2023    Expiration Date:   11/16/2024     Artis Delay, MD  INTERVAL HISTORY: she returns for treatment follow-up Complications related to previous cycle of chemotherapy included none The patient is very concerned about her financial constraint and would like to have her treatment canceled today  PHYSICAL EXAMINATION: ECOG PERFORMANCE STATUS: 0 - Asymptomatic  Vitals:   10/06/23 0803  BP: (!) 143/73  Pulse: 86  Resp: 18  SpO2: 100%   There were no vitals filed for this visit.  Relevant data reviewed during this visit included CBC and CMP and TSH

## 2023-10-06 NOTE — Assessment & Plan Note (Addendum)
 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

## 2023-10-07 LAB — T4: T4, Total: 5.5 ug/dL (ref 4.5–12.0)

## 2023-10-09 ENCOUNTER — Telehealth: Payer: Self-pay | Admitting: *Deleted

## 2023-10-09 ENCOUNTER — Other Ambulatory Visit: Payer: Self-pay | Admitting: Hematology and Oncology

## 2023-10-09 MED ORDER — LEVOTHYROXINE SODIUM 175 MCG PO TABS: 175.0000 ug | ORAL_TABLET | Freq: Every day | ORAL | 1 refills | Status: DC

## 2023-10-09 NOTE — Telephone Encounter (Signed)
-----   Message from Artis Delay sent at 10/09/2023  9:37 AM EDT ----- Regarding: high TSH Her TSH is very high Let her know I will send increased dose synthroid to her local pharmacy

## 2023-10-09 NOTE — Telephone Encounter (Signed)
 Left message below on pt voice mail. Advised to call office to make sure she received and verbalized understanding of message

## 2023-10-11 ENCOUNTER — Telehealth: Payer: Self-pay

## 2023-10-11 ENCOUNTER — Inpatient Hospital Stay: Attending: Gynecologic Oncology | Admitting: Licensed Clinical Social Worker

## 2023-10-11 NOTE — Progress Notes (Signed)
 [facility] Clinical Social Work  Visual merchandiser contacted patient by phone to follow-up on financial needs.  Pt is concerned about high OOP maximum and needing to pay $4000 each month.  CSW provided education on meeting OOP max and then having costs covered.  It appears that pt may have met her max already with previous visits this year.  CSW provided number for billing and encouraged pt to ask if she qualifies for hardship financial assistance.  Pt has previously applied for and been denied Medicaid.   CSW also discussed cancer foundations such as [place] [employer]. Pt has already spoken with them but they (and other foundations) only offer minimal assistance for non-medical expenses.    Pt is calling billing to determine if she can receive assistance from [facility]. She will call back with further needs or questions.   Richardine Peppers E Joycelynn Fritsche, LCSW  Clinical Social Worker [facility]

## 2023-10-11 NOTE — Telephone Encounter (Signed)
 Called her to follow up on canceling treatment. She will call the social worker back to discuss options tomorrow. She will call the office back for questions/ concerns.

## 2023-10-16 ENCOUNTER — Telehealth: Payer: Self-pay | Admitting: Licensed Clinical Social Worker

## 2023-10-16 ENCOUNTER — Telehealth: Payer: Self-pay

## 2023-10-16 NOTE — Telephone Encounter (Signed)
 Returned her call. She signed a release at MD office today to share her recent lab work. Told her once they send over the fax the office will send the lab results.

## 2023-10-16 NOTE — Telephone Encounter (Signed)
 [facility] Clinical Social Work  CSW received message from [employer] that pt was expecting a call regarding financial assistance. CSW contacted pt and advised to call back the billing department as they are the ones who will determine if she is eligible and approved for financial assistance with her bills through [place]. CSW provided contact information to pt today. Pt expressed understanding and stated she will call them now.    Zaiyden Strozier E Penni Penado, LCSW

## 2023-10-18 ENCOUNTER — Other Ambulatory Visit: Payer: Self-pay | Admitting: Hematology and Oncology

## 2023-10-20 ENCOUNTER — Telehealth: Payer: Self-pay

## 2023-10-20 NOTE — Telephone Encounter (Signed)
 Returned her call. She just spoke with billing and is still waiting for insurance to pay for her last infusion. She will call the office back when she knows what her co-pay will be for future infusion. She will call the office back with updates.

## 2023-11-03 ENCOUNTER — Telehealth: Payer: Self-pay

## 2023-11-03 NOTE — Telephone Encounter (Signed)
 Called and left a message asking her to call the office back. Asking if she is ready to reschedule appts.

## 2023-11-03 NOTE — Telephone Encounter (Signed)
Left another message asking her to call the office back. 

## 2023-11-06 ENCOUNTER — Telehealth: Payer: Self-pay

## 2023-11-06 NOTE — Telephone Encounter (Signed)
 Called and left a message asking her to call the office back to give update. Calling to see of she is ready to reschedule appts.

## 2023-11-13 ENCOUNTER — Telehealth: Payer: Self-pay

## 2023-11-13 NOTE — Telephone Encounter (Signed)
 Returned her call. She is ready to resume treatment and prefers Fridays early in the am. She set up a payment plan on her bill.

## 2023-11-14 ENCOUNTER — Encounter: Payer: Self-pay | Admitting: Hematology and Oncology

## 2023-11-14 ENCOUNTER — Other Ambulatory Visit: Payer: Self-pay | Admitting: Hematology and Oncology

## 2023-11-14 NOTE — Telephone Encounter (Signed)
 I will send urgent LOS to start this week

## 2023-11-15 ENCOUNTER — Other Ambulatory Visit: Payer: Self-pay

## 2023-11-16 ENCOUNTER — Encounter: Payer: Self-pay | Admitting: Hematology and Oncology

## 2023-11-16 ENCOUNTER — Other Ambulatory Visit: Payer: Self-pay

## 2023-11-16 DIAGNOSIS — C55 Malignant neoplasm of uterus, part unspecified: Secondary | ICD-10-CM

## 2023-11-16 DIAGNOSIS — E039 Hypothyroidism, unspecified: Secondary | ICD-10-CM

## 2023-11-17 ENCOUNTER — Inpatient Hospital Stay

## 2023-11-17 ENCOUNTER — Telehealth: Payer: Self-pay

## 2023-11-17 ENCOUNTER — Inpatient Hospital Stay: Admitting: Hematology and Oncology

## 2023-11-17 NOTE — Telephone Encounter (Signed)
 Called to follow up on missing appt today. She is not coming today due to a death of her sister. She ask that appts be moved to next Friday. Sent Dr. Marton Sleeper a message.

## 2023-11-18 ENCOUNTER — Other Ambulatory Visit: Payer: Self-pay

## 2023-11-24 ENCOUNTER — Inpatient Hospital Stay: Attending: Gynecologic Oncology

## 2023-11-24 ENCOUNTER — Inpatient Hospital Stay

## 2023-11-24 ENCOUNTER — Encounter: Payer: Self-pay | Admitting: Hematology and Oncology

## 2023-11-24 ENCOUNTER — Inpatient Hospital Stay (HOSPITAL_BASED_OUTPATIENT_CLINIC_OR_DEPARTMENT_OTHER): Admitting: Hematology and Oncology

## 2023-11-24 VITALS — BP 111/60 | HR 81 | Resp 16

## 2023-11-24 VITALS — BP 121/71 | HR 73 | Temp 97.2°F | Resp 19 | Wt 229.8 lb

## 2023-11-24 DIAGNOSIS — C7800 Secondary malignant neoplasm of unspecified lung: Secondary | ICD-10-CM | POA: Insufficient documentation

## 2023-11-24 DIAGNOSIS — C55 Malignant neoplasm of uterus, part unspecified: Secondary | ICD-10-CM | POA: Diagnosis not present

## 2023-11-24 DIAGNOSIS — E039 Hypothyroidism, unspecified: Secondary | ICD-10-CM

## 2023-11-24 DIAGNOSIS — D539 Nutritional anemia, unspecified: Secondary | ICD-10-CM | POA: Diagnosis not present

## 2023-11-24 DIAGNOSIS — E538 Deficiency of other specified B group vitamins: Secondary | ICD-10-CM | POA: Diagnosis not present

## 2023-11-24 DIAGNOSIS — D509 Iron deficiency anemia, unspecified: Secondary | ICD-10-CM | POA: Insufficient documentation

## 2023-11-24 DIAGNOSIS — Z5112 Encounter for antineoplastic immunotherapy: Secondary | ICD-10-CM | POA: Insufficient documentation

## 2023-11-24 LAB — CMP (CANCER CENTER ONLY)
ALT: 10 U/L (ref 0–44)
AST: 9 U/L — ABNORMAL LOW (ref 15–41)
Albumin: 3.6 g/dL (ref 3.5–5.0)
Alkaline Phosphatase: 87 U/L (ref 38–126)
Anion gap: 8 (ref 5–15)
BUN: 7 mg/dL — ABNORMAL LOW (ref 8–23)
CO2: 26 mmol/L (ref 22–32)
Calcium: 9.2 mg/dL (ref 8.9–10.3)
Chloride: 103 mmol/L (ref 98–111)
Creatinine: 0.77 mg/dL (ref 0.44–1.00)
GFR, Estimated: >60 mL/min (ref >60)
Glucose, Bld: 99 mg/dL (ref 70–99)
Potassium: 3.6 mmol/L (ref 3.5–5.1)
Sodium: 137 mmol/L (ref 135–145)
Total Bilirubin: 0.4 mg/dL (ref 0.0–1.2)
Total Protein: 7.8 g/dL (ref 6.5–8.1)

## 2023-11-24 LAB — IRON AND IRON BINDING CAPACITY (CC-WL,HP ONLY)
Iron: 17 ug/dL — ABNORMAL LOW (ref 28–170)
Saturation Ratios: 8 % — ABNORMAL LOW (ref 10.4–31.8)
TIBC: 220 ug/dL — ABNORMAL LOW (ref 250–450)
UIBC: 203 ug/dL (ref 148–442)

## 2023-11-24 LAB — CBC WITH DIFFERENTIAL (CANCER CENTER ONLY)
Abs Immature Granulocytes: 0.02 10*3/uL (ref 0.00–0.07)
Basophils Absolute: 0 10*3/uL (ref 0.0–0.1)
Basophils Relative: 1 %
Eosinophils Absolute: 0.2 10*3/uL (ref 0.0–0.5)
Eosinophils Relative: 3 %
HCT: 30.1 % — ABNORMAL LOW (ref 36.0–46.0)
Hemoglobin: 9.5 g/dL — ABNORMAL LOW (ref 12.0–15.0)
Immature Granulocytes: 0 %
Lymphocytes Relative: 16 %
Lymphs Abs: 1 10*3/uL (ref 0.7–4.0)
MCH: 20.9 pg — ABNORMAL LOW (ref 26.0–34.0)
MCHC: 31.6 g/dL (ref 30.0–36.0)
MCV: 66.3 fL — ABNORMAL LOW (ref 80.0–100.0)
Monocytes Absolute: 0.5 10*3/uL (ref 0.1–1.0)
Monocytes Relative: 8 %
Neutro Abs: 4.5 10*3/uL (ref 1.7–7.7)
Neutrophils Relative %: 72 %
Platelet Count: 389 10*3/uL (ref 150–400)
RBC: 4.54 MIL/uL (ref 3.87–5.11)
RDW: 15.9 % — ABNORMAL HIGH (ref 11.5–15.5)
WBC Count: 6.3 10*3/uL (ref 4.0–10.5)
nRBC: 0 % (ref 0.0–0.2)

## 2023-11-24 LAB — VITAMIN B12: Vitamin B-12: 471 pg/mL (ref 180–914)

## 2023-11-24 LAB — FERRITIN: Ferritin: 272 ng/mL (ref 11–307)

## 2023-11-24 LAB — TSH: TSH: 0.662 u[IU]/mL (ref 0.350–4.500)

## 2023-11-24 MED ORDER — CYANOCOBALAMIN 1000 MCG/ML IJ SOLN: 1000.0000 ug | Freq: Once | INTRAMUSCULAR | Status: DC

## 2023-11-24 MED ORDER — SODIUM CHLORIDE 0.9 % IV SOLN
400.0000 mg | Freq: Once | INTRAVENOUS | Status: AC
  Administered 2023-11-24: 400 mg via INTRAVENOUS
  Filled 2023-11-24: qty 16

## 2023-11-24 MED ORDER — CYANOCOBALAMIN 1000 MCG/ML IJ SOLN
1000.0000 ug | Freq: Once | INTRAMUSCULAR | Status: AC
  Administered 2023-11-24: 1000 ug via INTRAMUSCULAR
  Filled 2023-11-24: qty 1

## 2023-11-24 MED ORDER — SODIUM CHLORIDE 0.9 % IV SOLN: Freq: Once | INTRAVENOUS | Status: AC

## 2023-11-24 MED ORDER — SODIUM CHLORIDE 0.9% FLUSH
10.0000 mL | INTRAVENOUS | Status: DC | PRN
  Administered 2023-11-24: 10 mL

## 2023-11-24 MED ORDER — SODIUM CHLORIDE 0.9% FLUSH
10.0000 mL | Freq: Once | INTRAVENOUS | Status: AC
Start: 2023-11-24 — End: 2023-11-24
  Administered 2023-11-24: 10 mL

## 2023-11-24 MED ORDER — HEPARIN SOD (PORK) LOCK FLUSH 100 UNIT/ML IV SOLN
500.0000 [IU] | Freq: Once | INTRAVENOUS | Status: AC | PRN
Start: 2023-11-24 — End: 2023-11-24
  Administered 2023-11-24: 500 [IU]

## 2023-11-24 NOTE — Assessment & Plan Note (Addendum)
 She have signs of worsening microcytic anemia I will order iron  studies to rule out iron  deficiency

## 2023-11-24 NOTE — Patient Instructions (Addendum)
 [facility] - A DEPT OF [facility]  Discharge Instructions: Thank you for choosing [facility] to provide your oncology and hematology care.   If you have a lab appointment with the Cancer Center, please go directly to the Cancer Center and check in at the registration area.   Wear comfortable clothing and clothing appropriate for easy access to any Portacath or PICC line.   We strive to give you quality time with your provider. You may need to reschedule your appointment if you arrive late (15 or more minutes).  Arriving late affects you and other patients whose appointments are after yours.  Also, if you miss three or more appointments without notifying the office, you may be dismissed from the clinic at the provider's discretion.      For prescription refill requests, have your pharmacy contact our office and allow 72 hours for refills to be completed.    Today you received the following chemotherapy and/or immunotherapy agent: Pembrolizumab  (Keytruda )    To help prevent nausea and vomiting after your treatment, we encourage you to take your nausea medication as directed.  BELOW ARE SYMPTOMS THAT SHOULD BE REPORTED IMMEDIATELY: *FEVER GREATER THAN 100.4 F (38 C) OR HIGHER *CHILLS OR SWEATING *NAUSEA AND VOMITING THAT IS NOT CONTROLLED WITH YOUR NAUSEA MEDICATION *UNUSUAL SHORTNESS OF BREATH *UNUSUAL BRUISING OR BLEEDING *URINARY PROBLEMS (pain or burning when urinating, or frequent urination) *BOWEL PROBLEMS (unusual diarrhea, constipation, pain near the anus) TENDERNESS IN MOUTH AND THROAT WITH OR WITHOUT PRESENCE OF ULCERS (sore throat, sores in mouth, or a toothache) UNUSUAL RASH, SWELLING OR PAIN  UNUSUAL VAGINAL DISCHARGE OR ITCHING   Items with * indicate a potential emergency and should be followed up as soon as possible or go to the Emergency Department if any problems should occur.  Please show the CHEMOTHERAPY ALERT CARD or  IMMUNOTHERAPY ALERT CARD at check-in to the Emergency Department and triage nurse.  Should you have questions after your visit or need to cancel or reschedule your appointment, please contact [facility] - A DEPT OF [facility]  Dept: [phone]  and follow the prompts.  Office hours are 8:00 a.m. to 4:30 p.m. Monday - Friday. Please note that voicemails left after 4:00 p.m. may not be returned until the following business day.  We are closed weekends and major holidays. You have access to a nurse at all times for urgent questions. Please call the main number to the clinic Dept: [phone] and follow the prompts.   For any non-urgent questions, you may also contact your provider using MyChart. We now offer e-Visits for anyone 105 and older to request care online for non-urgent symptoms. For details visit [email or web address].   Also download the MyChart app! Go to the app store, search "MyChart", open the app, select [facility], and log in with your MyChart username and password.  Vitamin B12 Deficiency Vitamin B12 deficiency occurs when the body does not have enough of this important vitamin. The body needs this vitamin: To make red blood cells. To make DNA. This is the genetic material inside cells. To help the nerves work properly so they can carry messages from the brain to the body. Vitamin B12 deficiency can cause health problems, such as not having enough red blood cells in the blood (anemia). This can lead to nerve damage if untreated. What are the causes? This condition may be caused by: Not  eating enough foods that contain vitamin B12. Not having enough stomach acid and digestive fluids to properly absorb vitamin B12 from the food that you eat. Having certain diseases that make it hard to absorb vitamin B12. These diseases include Crohn's disease, chronic pancreatitis, and cystic fibrosis. An autoimmune disorder in which the body does not make  enough of a protein (intrinsic factor) within the stomach, resulting in not enough absorption of vitamin B12. Having a surgery in which part of the stomach or small intestine is removed. Taking certain medicines that make it hard for the body to absorb vitamin B12. These include: Heartburn medicines, such as antacids and proton pump inhibitors. Some medicines that are used to treat diabetes. What increases the risk? The following factors may make you more likely to develop a vitamin B12 deficiency: Being an older adult. Eating a vegetarian or vegan diet that does not include any foods that come from animals. Eating a poor diet while you are pregnant. Taking certain medicines. Having alcoholism. What are the signs or symptoms? In some cases, there are no symptoms of this condition. If the condition leads to anemia or nerve damage, various symptoms may occur, such as: Weakness. Tiredness (fatigue). Loss of appetite. Numbness or tingling in your hands and feet. Redness and burning of the tongue. Depression, confusion, or memory problems. Trouble walking. If anemia is severe, symptoms can include: Shortness of breath. Dizziness. Rapid heart rate. How is this diagnosed? This condition may be diagnosed with a blood test to measure the level of vitamin B12 in your blood. You may also have other tests, including: A group of tests that measure certain characteristics of blood cells (complete blood count, CBC). A blood test to measure intrinsic factor. A procedure where a thin tube with a camera on the end is used to look into your stomach or intestines (endoscopy). Other tests may be needed to discover the cause of the deficiency. How is this treated? Treatment for this condition depends on the cause. This condition may be treated by: Changing your eating and drinking habits, such as: Eating more foods that contain vitamin B12. Drinking less alcohol or no alcohol. Getting vitamin B12  injections. Taking vitamin B12 supplements by mouth (orally). Your health care provider will tell you which dose is best for you. Follow these instructions at home: Eating and drinking  Include foods in your diet that come from animals and contain a lot of vitamin B12. These include: Meats and poultry. This includes beef, pork, chicken, [place], and organ meats, such as liver. Seafood. This includes clams, rainbow trout, salmon, tuna, and haddock. Eggs. Dairy foods such as milk, yogurt, and cheese. Eat foods that have vitamin B12 added to them (are fortified), such as ready-to-eat breakfast cereals. Check the label on the package to see if a food is fortified. The items listed above may not be a complete list of foods and beverages you can eat and drink. Contact a dietitian for more information. Alcohol use Do not drink alcohol if: Your health care provider tells you not to drink. You are pregnant, may be pregnant, or are planning to become pregnant. If you drink alcohol: Limit how much you have to: 0-1 drink a day for women. 0-2 drinks a day for men. Know how much alcohol is in your drink. In the [place] one drink equals one 12 oz bottle of beer (355 mL), one 5 oz glass of wine (148 mL), or one 1 oz glass of hard liquor (44 mL).  General instructions Get vitamin B12 injections if told to by your health care provider. Take supplements only as told by your health care provider. Follow the directions carefully. Keep all follow-up visits. This is important. Contact a health care provider if: Your symptoms come back. Your symptoms get worse or do not improve with treatment. Get help right away: You develop shortness of breath. You have a rapid heart rate. You have chest pain. You become dizzy or you faint. These symptoms may be an emergency. Get help right away. Call 911. Do not wait to see if the symptoms will go away. Do not drive yourself to the hospital. Summary Vitamin B12  deficiency occurs when the body does not have enough of this important vitamin. Common causes include not eating enough foods that contain vitamin B12, not being able to absorb vitamin B12 from the food that you eat, having a surgery in which part of the stomach or small intestine is removed, or taking certain medicines. Eat foods that have vitamin B12 in them. Treatment may include making a change in the way you eat and drink, getting vitamin B12 injections, or taking vitamin B12 supplements. This information is not intended to replace advice given to you by your health care provider. Make sure you discuss any questions you have with your health care provider. Document Revised: 02/19/2021 Document Reviewed: 02/19/2021 Elsevier Patient Education  2024 [employer].

## 2023-11-24 NOTE — Assessment & Plan Note (Addendum)
 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

## 2023-11-24 NOTE — Assessment & Plan Note (Addendum)
 She will continue vitamin B12 injection I will repeat her level today

## 2023-11-24 NOTE — Progress Notes (Signed)
 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 and recurrent disease in her pelvis and abdominal lymphadenopathy, treated with pembrolizumab  with complete response seen by February 2024  She tolerated pembrolizumab  very well with no side effects The plan will be to continue for at least another year until February 2026 Will proceed with treatment today Plan to repeat imaging study in June  Vitamin B12 deficiency She will continue vitamin B12 injection I will repeat her level today Deficiency anemia She have signs of worsening microcytic anemia I will order iron  studies to rule out iron  deficiency Hypothyroidism, unspecified type Repeat TSH is pending I will adjust the dose of Synthroid  as needed  Orders Placed This Encounter  Procedures   CT ABDOMEN PELVIS W CONTRAST    Standing Status:   Future    Expected Date:   12/29/2023    Expiration Date:   11/23/2024    Scheduling Instructions:     No oral contrast    If indicated for the ordered procedure, I authorize the administration of contrast media per Radiology protocol:   Yes    Does the patient have a contrast media/X-ray dye allergy?:   No    Preferred imaging location?:   [facility]    If indicated for the ordered procedure, I authorize the administration of oral contrast media per Radiology protocol:   No    Reason for no oral contrast::   No need  oral contrast   Ferritin    Standing Status:   Future    Expiration Date:   11/23/2024   Iron  and Iron  Binding Capacity ([facility] only)    Standing Status:   Future    Expiration Date:   11/23/2024   Vitamin B12    Standing Status:   Future    Expiration Date:   11/23/2024   CBC with Differential (Cancer Center Only)    Standing Status:   Future    Expected Date:   01/05/2024    Expiration Date:   01/04/2025   CMP (Cancer Center only)    Standing Status:   Future    Expected Date:   01/05/2024    Expiration Date:   01/04/2025   T4    Standing Status:   Future    Expected Date:   01/05/2024    Expiration Date:   01/04/2025   TSH    Standing Status:   Future    Expected Date:   01/05/2024    Expiration Date:   01/04/2025   CBC with Differential (Cancer Center Only)    Standing Status:   Future    Expected Date:   02/16/2024    Expiration Date:   02/15/2025   CMP (Cancer Center only)    Standing Status:   Future    Expected Date:   02/16/2024    Expiration Date:   02/15/2025   T4    Standing Status:   Future    Expected Date:   02/16/2024  Expiration Date:   02/15/2025   TSH    Standing Status:   Future    Expected Date:   02/16/2024    Expiration Date:   02/15/2025   CBC with Differential (Cancer Center Only)    Standing Status:   Future    Expected Date:   03/29/2024    Expiration Date:   03/29/2025   CMP (Cancer Center only)    Standing Status:   Future    Expected Date:   03/29/2024    Expiration Date:   03/29/2025   T4    Standing Status:   Future    Expected Date:   03/29/2024    Expiration Date:   03/29/2025   TSH    Standing Status:   Future    Expected Date:   03/29/2024    Expiration Date:   03/29/2025     Candice Jacobs, MD  INTERVAL HISTORY: she returns for treatment follow-up Her treatment was delayed for a while due to insurance issue She is now returning to resume treatment She has lost some weight through intentional dietary modification with portion control She felt  better The patient denies any recent signs or symptoms of bleeding such as spontaneous epistaxis, hematuria or hematochezia. Denies abdominal pain or changes in bowel habits  PHYSICAL EXAMINATION: ECOG PERFORMANCE STATUS: 0 - Asymptomatic  Vitals:   11/24/23 1002  BP: 121/71  Pulse: 73  Resp: 19  Temp: (!) 97.2 F (36.2 C)  SpO2: 98%   Filed Weights   11/24/23 1002  Weight: 229 lb 12.8 oz (104.2 kg)    Relevant data reviewed during this visit included CBC, CMP, vitamin B12, thyroid  function study

## 2023-11-24 NOTE — Assessment & Plan Note (Addendum)
 Repeat TSH is pending I will adjust the dose of Synthroid as needed

## 2023-11-25 ENCOUNTER — Other Ambulatory Visit: Payer: Self-pay

## 2023-11-25 LAB — T4: T4, Total: 10.1 ug/dL (ref 4.5–12.0)

## 2023-11-27 ENCOUNTER — Telehealth: Payer: Self-pay

## 2023-11-27 NOTE — Telephone Encounter (Signed)
 Called and told her per Dr. Marton Sleeper, iron  studies confirm iron  def anemia. Take 1 oral iron  daily 30 mins before breakfast. She verbalized understanding.

## 2023-12-03 ENCOUNTER — Other Ambulatory Visit: Payer: Self-pay

## 2023-12-29 ENCOUNTER — Ambulatory Visit (HOSPITAL_COMMUNITY)
Admission: RE | Admit: 2023-12-29 | Discharge: 2023-12-29 | Disposition: A | Discharge status: Home / Self Care | Source: Ambulatory Visit | Attending: Hematology and Oncology | Admitting: Hematology and Oncology

## 2023-12-29 ENCOUNTER — Encounter (HOSPITAL_COMMUNITY): Payer: Self-pay

## 2023-12-29 DIAGNOSIS — C55 Malignant neoplasm of uterus, part unspecified: Secondary | ICD-10-CM | POA: Diagnosis present

## 2023-12-29 MED ORDER — IOHEXOL 300 MG/ML  SOLN
100.0000 mL | Freq: Once | INTRAMUSCULAR | Status: AC | PRN
  Administered 2023-12-29: 100 mL via INTRAVENOUS

## 2023-12-29 MED ORDER — HEPARIN SOD (PORK) LOCK FLUSH 100 UNIT/ML IV SOLN
INTRAVENOUS | Status: AC
  Filled 2023-12-29: qty 5

## 2023-12-29 MED ORDER — HEPARIN SOD (PORK) LOCK FLUSH 100 UNIT/ML IV SOLN
500.0000 [IU] | Freq: Once | INTRAVENOUS | Status: AC
  Administered 2023-12-29: 500 [IU] via INTRAVENOUS

## 2024-01-03 ENCOUNTER — Other Ambulatory Visit: Payer: Self-pay | Admitting: Hematology and Oncology

## 2024-01-05 ENCOUNTER — Telehealth: Payer: Self-pay

## 2024-01-05 ENCOUNTER — Inpatient Hospital Stay

## 2024-01-05 ENCOUNTER — Ambulatory Visit (HOSPITAL_BASED_OUTPATIENT_CLINIC_OR_DEPARTMENT_OTHER): Admitting: Hematology and Oncology

## 2024-01-05 ENCOUNTER — Inpatient Hospital Stay: Admitting: Hematology and Oncology

## 2024-01-05 ENCOUNTER — Encounter: Payer: Self-pay | Admitting: Hematology and Oncology

## 2024-01-05 DIAGNOSIS — C55 Malignant neoplasm of uterus, part unspecified: Secondary | ICD-10-CM

## 2024-01-05 DIAGNOSIS — E039 Hypothyroidism, unspecified: Secondary | ICD-10-CM

## 2024-01-05 DIAGNOSIS — J8489 Other specified interstitial pulmonary diseases: Secondary | ICD-10-CM | POA: Insufficient documentation

## 2024-01-05 MED ORDER — PREDNISONE 20 MG PO TABS: 60.0000 mg | ORAL_TABLET | Freq: Every day | ORAL | 0 refills | Status: DC

## 2024-01-05 MED ORDER — AMOXICILLIN-POT CLAVULANATE 875-125 MG PO TABS: 1.0000 | ORAL_TABLET | Freq: Two times a day (BID) | ORAL | 0 refills | Status: DC

## 2024-01-05 NOTE — Telephone Encounter (Signed)
 Called and left a message asking her to call the office regarding not showing up for appts today.

## 2024-01-05 NOTE — Progress Notes (Signed)
 HEMATOLOGY-ONCOLOGY ELECTRONIC VISIT PROGRESS NOTE  Patient Care Team: Pcp, No as PCP - General  I connected with the patient via telephone conference and verified that I am speaking with the correct person using two identifiers. The patient's location is at home and I am providing care from the [facility] I discussed the limitations, risks, security and privacy concerns of performing an evaluation and management service by e-visits and the availability of in person appointments.  I also discussed with the patient that there may be a patient responsible charge related to this service. The patient expressed understanding and agreed to proceed.   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 and recurrent disease in her pelvis and abdominal lymphadenopathy, treated with pembrolizumab  with complete response seen by February 2024  She tolerated pembrolizumab  very well with no side effects I reviewed CT imaging which show significant abnormalities in her lungs, worrisome for possible pneumonia/pneumonitis from treatment She is symptomatic with coughing We will put her treatment on hold I will see her again in 2 weeks for further follow-up  Pneumonitis, interstitial (HCC) CT imaging of her lungs revealed significant signs of changes suspicious for either drug reaction versus infection She is symptomatic with significant productive cough I recommend a course of antibiotics and high-dose prednisone Her treatment is placed on hold I will see  her again in 2 weeks for further follow-up  Orders Placed This Encounter  Procedures   Comprehensive metabolic panel with GFR    Standing Status:   Standing    Number of Occurrences:   33    Expiration Date:   01/04/2025   CBC with Differential/Platelet    Standing Status:   Standing    Number of Occurrences:   22    Expiration Date:   01/04/2025   TSH    Standing Status:   Standing    Number of Occurrences:   22    Expiration Date:   01/04/2025    INTERVAL HISTORY: Please see below for problem oriented charting. The purpose of today's discussion is review CT imaging results The patient did not show up for her appointment After several hours, we are able to get hold of her and her appointment is now switched to virtual Since last time I saw her, she has been having significant productive cough of white sputum Denies fever or chills No excessive shortness of breath I reviewed recent CT imaging findings with the patient and recommended placing her treatment on hold along with antibiotics and anti-inflammatory medication  SUMMARY OF ONCOLOGIC HISTORY: Oncology History Overview Note  Endometrioid cancer, 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   Uterine cancer (HCC)  01/05/2021 Imaging   Outside CT imaging  Bilateral hydronephrosis related extrinsic compression on the ureters by the enlarged uterus.   Multiple calcified uterine fibroids although peripherally enhancing diffuse decreased attenuation is noted centrally within the uterus highly suspicious for endometrial neoplasm given the current history  of vaginal bleeding. Further evaluation is recommended. MRI may be helpful. Additionally tissue sampling would likely be helpful.   Hypodensity in the pancreas at the junction of the head and  body of the pancreas. This may be related to volume averaging although the possibility of a cystic lesion could not be totally excluded. MRI is recommended for further evaluation.    01/05/2021 Initial Diagnosis   The patient reported at least 1 year of postmenopausal bleeding.  She does not have a primary care provider or gynecologist.  She had known fibroids and had noted that she needed a hysterectomy but had not gotten around to seeking care for this.  She presented to the [facility] emergency department on January 05, 2021 with new onset right lower quadrant pain and this prompted a CT scan of the abdomen and pelvis to be performed on 01/05/2021.     01/14/2021 Pathology Results   FINAL MICROSCOPIC DIAGNOSIS:   A. CERVIX, ENDOCERVIX, BIOPSY:  -  Endometrioid carcinoma, FIGO grade 1  -  See comment    01/26/2021 Imaging   MRI abdomen  1. No suspicious pancreatic mass identified. 2. Moderate to severe bilateral hydroureteronephrosis, similar to recent CT the abdomen and pelvis, presumably related to extrinsic ureteral compression from the patient's enlarged uterus, as demonstrated on recent CT the abdomen and pelvis 01/05/2021.   02/01/2021 Surgery   Surgery in [facility]  Date of Service: February 01, 2021 3:13 PM  Preoperative Diagnoses: Endometrial cancer  Postoperative Diagnoses: Endometrial cancer  Procedures: Total abdominal hysterectomy, bilateral salpingo-oophorectomy, left pelvic lymphadenectomy, right para-aortic lymphadenectomy  Surgeon: Maurilio Ship, MD  Findings: Tumor appreciated in the cervical canal. Large, broad, multi fibroid uterus with a calcified fibroid filling the posterior cul de sac, an anterior lower uterine segment fibroid, and bilateral fibroids near the level of the cardinal ligament. No evidence of intraperitoneal disease. Normal appearing bilateral tubes and ovaries. Bilateral hydroureter from uterine compression. Tumor excreted from specimen into the pelvis at time of removal.  An approximately 2cm enlarged left pelvic lymph node and a 1cm enlarged right aorta caval lymph node were identified.     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) - Right ovary, negative for tumor - Bilateral fallopian tubes, negative for tumor - Multiple leiomyomata (up to 8.5 cm)   B: Lymph node, aortocaval, excision - Metastatic carcinoma involving 1 lymph node (0.4 cm) (1/1)   C: Lymph node, left pelvic, excision -Metastatic carcinoma involving 1 lymph node (0.3 cm) (1/1)   Diagnosis Comment    The majority of this tumor is composed of well differentiated (overall grade 2, but with areas consistent with grade 1) endometrioid adenocarcinoma with squamous and mucinous differentiation (approximately 70%); multifocally there is abrupt transition to an undifferentiated malignancy with a high-grade monotonous noncohesive appearance (best seen in slide A2) consistent with undifferentiated carcinoma.  The overall morphologic and immunophenotypic findings are consistent with a dedifferentiated endometrial carcinoma.   Of note, the carcinoma present in the lymph nodes is well differentiated endometrioid carcinoma and the metastatic deposit in the ovary is undifferentiated carcinoma. Carcinoma is present less than 1 mm from uterine corpus serosa (in the lower uterine segment).  The assessment of the paracervical soft  tissue margins is challenging due to the disrupted nature of the specimen in this area, however tumor appears present less than 1 mm from paracervical soft tissue.  No definite parametrial invasion is identified.   Immunohistochemistry  performed at [facility] (with appropriate controls) demonstrates the following immunoprofile in cells of interest: Well differentiated endometrioid carcinoma: CK AE1/AE3: Positive ER: Positive (>90%, strong staining) P53: Favor wild-type (see comment below)   Dedifferentiated carcinoma: CK AE1/AE3: Very rare cells, predominantly negative ER: Negative P53: Small focus of mutant type staining in a background of wild-type staining (see comment below)   P53 staining comment: In the well differentiated carcinoma component the staining pattern is predominantly wild-type although focally there is slightly increased levels of staining noted.  In the dedifferentiated tumor component, most of the tissue exhibits wild-type pattern staining, however there is a distinct small area that exhibits increased/overexpressed mutant pattern p53 staining.    Immunohistochemistry testing for mismatch repair (MMR) proteins is performed on block A2:   MLH1:  Loss of nuclear expression MSH2:  Intact nuclear expression MSH6:  Intact nuclear expression PMS2:  Loss of nuclear expression   Loss of nuclear expression of MLH1 and PMS2 can be a result of sporadic microsatellite instability (via hypermethylation of the MLH1 promoter region) or due to Lynch syndrome / HNPCC. Materials will be submitted to the [facility] [id number]) for MLH promoter hypermethylation testing. These results will be issued in a separate report.      03/05/2021 Initial Diagnosis   Uterine cancer (HCC)   03/05/2021 Cancer Staging   Staging form: Corpus Uteri - Carcinoma and Carcinosarcoma, AJCC 8th Edition - Pathologic stage from 03/05/2021: FIGO Stage IVB (rpT3, pN2a, cM1) - Signed by Lonn Hicks, MD on 11/12/2021 Stage prefix: Recurrence   03/11/2021 Imaging   1. Status post interval hysterectomy and oophorectomy. 2. There is a rim enhancing soft tissue mass centered in the vagina, measuring approximately 4.4 x 3.1 cm. This is  new, or significantly enlarged compared to prior examination dated 01/05/2021. Findings are concerning for malignant involvement. Correlate with physical examination. Contrast enhanced MRI may be helpful to further detail soft tissue anatomy of the low pelvis if indicated. 3. There is at least one newly enlarged left iliac lymph node measuring up to 1.4 x 1.3 cm, nonspecific although concerning for nodal metastatic disease. 4. Fluid attenuation lymphocele or seroma adjacent to surgical clip at the level right ovarian vein. 5. Status post interval midline laparotomy. Subcutaneous fluid within the inferior aspect of the wound. The presence or absence of infection is not established by CT.     03/23/2021 - 07/09/2021 Chemotherapy   Patient is on Treatment Plan : UTERINE Carboplatin  AUC 6 / Paclitaxel  q21d     03/23/2021 Procedure   Placement of a subcutaneous power-injectable port device. Catheter tip at the superior cavoatrial junction   07/30/2021 Imaging   1. Large ill-defined partially enhancing and likely centrally necrotic vaginal and paravaginal mass. Recommend correlation with direct visualization. MRI pelvis without and with contrast may be helpful for further evaluation. 2. No findings suspicious for omental or peritoneal surface disease. No adenopathy.   08/09/2021 - 09/08/2021 Radiation Therapy   Indication for treatment:  Curative       Radiation treatment dates:   08/09/21 through 09/08/21 Site/dose:  IMRT Uterus; Vagina : 41.5 Gy delivered in 23 Fx with 1.80 Gy/Fx (Planned dosage was for 45.00 GY delivered in 25 Fx with 1.80 Gy/Fx, her final 2 treatments (and boost) was canceled  secondary to significant radiation reaction)   Beams/energy: 6X   11/11/2021 Imaging   1. 11 mm left upper lobe pulmonary nodule in the apicoposterior segment abuts the posterior pleural margin. In this clinical context the appearance is very concerning for a metastatic lesion although we do not have prior  cross-sectional imaging to assess stability. Proximity to the fifth rib and adjacent major fissure might make percutaneous biopsy tricky; PET-CT would also be one way of assessing the metabolic activity/malignant potential of this lesion. This may be a good candidate case for discussion at multidisciplinary cancer conference. 2. Increased adenopathy along the left iliac chain, including an internal iliac node which abuts the left sacral plexus. 3. The lower vaginal and vestibular mass measures about 7.0 by 5.5 by 5.9 cm and has some internal gas which may indicate ulceration along the vestibule/perineum. Poor definition of tissue planes between this process in the base of the urinary bladder as well as the anus. The mass extends along the left anterior obturator internus muscle. 4. Other imaging findings of potential clinical significance: Prominent main pulmonary artery, cannot exclude pulmonary arterial hypertension. Aortic Atherosclerosis (ICD10-I70.0). Lower lumbar spondylosis and degenerative disc disease.   11/19/2021 - 03/11/2022 Chemotherapy   Patient is on Treatment Plan : UTERINE Pembrolizumab  (200) q21d     11/19/2021 -  Chemotherapy   Patient is on Treatment Plan : UTERINE Pembrolizumab  (400) q42d     01/21/2022 Imaging   1. Status post hysterectomy. Generally diminished size of an ulcerated mass involving the vagina when compared to prior examination, difficult to accurately measure due to poor resolution of adjacent soft tissues. 2. Significant interval decrease in size of left pelvic sidewall and iliac lymphadenopathy. 3. Interval enlargement of a pulmonary nodule of the posterior left upper lobe. 4. 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 5. New, diffuse urinary bladder wall thickening and adjacent fat stranding, consistent  with nonspecific infectious or inflammatory cystitis, most likely related to local radiation therapy.   04/22/2022 Imaging   1. Resolution of previously noted soft tissue mass associated with the apex of the vagina. 2. Regression of previously noted left upper lobe pulmonary nodule. However, there are multiple new satellite nodules in the adjacent lung parenchyma in the upper left lung, largest of which measures 1.2 x 0.6 cm in the superior segment of the left lower lobe. Close attention on follow-up studies is recommended. 3. Interval development of mildly enlarged bilateral inguinal lymph nodes. These could be reactive, however, the possibility of locoregional nodal metastases is not excluded, and close attention on follow-up imaging is recommended to ensure regression of these findings. 4. Aortic atherosclerosis, in addition to left anterior descending coronary artery disease. Assessment for potential risk factor modification, dietary therapy or pharmacologic therapy may be warranted, if clinically indicated.  5. Additional incidental findings, as above.   08/29/2022 Imaging   1. Previously noted clustered nodularity in the upper left lung has resolved compared to the prior study. However, today's study demonstrates a new mass-like area of architectural distortion with some internal air bronchograms and cavitation in the posterior aspect of the left upper lobe in the same region. Although this is mass-like in appearance on axial images, this is rather linear in appearance on coronal and sagittal reconstructions. If there has been radiation therapy to this region, this could simply represent an area of evolving postradiation mass-like  fibrosis. In the absence of a history of radiation therapy, these findings are presumably of infectious etiology. Neoplasm is not favored, but difficult to entirely exclude. Close attention on follow-up studies is recommended to evaluate for the evolution of these findings.  No other suspicious appearing pulmonary nodules or masses are noted on today's examination. 2. No definite signs of metastatic disease noted in the abdomen or pelvis. 3. Multiple prominent borderline enlarged and minimally enlarged bilateral inguinal lymph nodes, slightly less prominent than the prior examination. Continued attention on follow-up studies is recommended. 4. Severe circumferential thickening of the urinary bladder wall without discrete bladder wall mass. This is of uncertain etiology and significance, but correlation with urinalysis is recommended to exclude the possibility of cystitis. Attention on follow-up imaging is recommended to ensure resolution. 5. Aortic atherosclerosis, in addition to left anterior descending coronary artery disease. Please note that although the presence of coronary artery calcium documents the presence of coronary artery disease, the severity of this disease and any potential stenosis cannot be assessed on this non-gated CT examination. Assessment for potential risk factor modification, dietary therapy or pharmacologic therapy may be warranted, if clinically indicated. 6. Additional incidental findings, as above.     12/23/2022 Imaging   CT ABDOMEN PELVIS W CONTRAST  Result Date: 12/22/2022 CLINICAL DATA:  Cervical cancer; * Tracking Code: BO * EXAM: CT ABDOMEN AND PELVIS WITH CONTRAST TECHNIQUE: Multidetector CT imaging of the abdomen and pelvis was performed using the standard protocol following bolus administration of intravenous contrast. RADIATION DOSE REDUCTION: This exam was performed according to the departmental dose-optimization program which includes automated exposure control, adjustment of the mA and/or kV according to patient size and/or use of iterative reconstruction technique. CONTRAST:  OMNIPAQUE  IOHEXOL  300 MG/ML  SOLN COMPARISON:  CT chest, abdomen and pelvis dated August 29, 2022 FINDINGS: Lower chest: Small hiatal hernia.  No acute  abnormality. Hepatobiliary: No focal liver abnormality is seen. No gallstones, gallbladder wall thickening, or biliary dilatation. Pancreas: Relative hypoattenuation of the pancreatic head, unchanged when compared with multiple priors and likely due to focal fatty atrophy. No pancreatic ductal dilatation or surrounding inflammatory changes. Spleen: Normal in size without focal abnormality. Adrenals/Urinary Tract: Bilateral adrenal glands are unremarkable. Hydronephrosis or nephrolithiasis. Bladder is unremarkable. Stomach/Bowel: Stomach is within normal limits. Appendix appears normal. No evidence of bowel wall thickening, distention, or inflammatory changes. Vascular/Lymphatic: Aortic atherosclerosis. No enlarged abdominal or pelvic lymph nodes. Reproductive: Status post hysterectomy. No adnexal masses. Other: No abdominal wall hernia or abnormality. No abdominopelvic ascites. Musculoskeletal: No acute or significant osseous findings. IMPRESSION: 1. No evidence of metastatic disease in the abdomen or pelvis. 2. Stable prominent bilateral inguinal lymph nodes. 3. Aortic Atherosclerosis (ICD10-I70.0). Electronically Signed   By: Rea Marc M.D.   On: 12/22/2022 15:38      05/26/2023 Imaging   CT CHEST ABDOMEN PELVIS W CONTRAST  Result Date: 05/26/2023 CLINICAL DATA:  History of cervical cancer, follow-up. * Tracking Code: BO * EXAM: CT CHEST, ABDOMEN, AND PELVIS WITH CONTRAST TECHNIQUE: Multidetector CT imaging of the chest, abdomen and pelvis was performed following the standard protocol during bolus administration of intravenous contrast. RADIATION DOSE REDUCTION: This exam was performed according to the departmental dose-optimization program which includes automated exposure control, adjustment of the mA and/or kV according to patient size and/or use of iterative reconstruction technique. CONTRAST:  OMNIPAQUE  IOHEXOL  300 MG/ML  SOLN COMPARISON:  Multiple priors including most recent CT December 21, 2022 and August 29, 2022. FINDINGS:  CT CHEST FINDINGS Cardiovascular: Accessed right chest Port-A-Cath with tip near the superior cavoatrial junction. Aortic atherosclerosis. Normal caliber thoracic aorta. No central pulmonary embolus on this nondedicated study. Normal size heart. No significant pericardial effusion/thickening. Mediastinum/Nodes: No suspicious thyroid  nodule. No pathologically enlarged mediastinal, hilar or axillary lymph nodes. Slightly increased soft tissue in the anterior mediastinum which conforms to the underlying aorta measuring 10 x 9 mm on image 22/2, possibly reflecting rebound thymic tissue. Small hiatal hernia. Lungs/Pleura: Similar scarring/atelectasis in the left upper lobe on image 47/4. No new suspicious pulmonary nodules or masses. No pleural effusion. No pneumothorax. Musculoskeletal: No aggressive lytic or blastic lesion of bone. Degenerative change of the spine. CT ABDOMEN PELVIS FINDINGS Hepatobiliary: No suspicious hepatic lesion. Gallbladder is unremarkable. No biliary ductal dilation. Pancreas: Similar hypodensity in the pancreatic head favored to reflect fatty infiltration. No pancreatic ductal dilation or evidence of acute inflammation. Spleen: No splenomegaly. Adrenals/Urinary Tract: Bilateral adrenal glands appear normal. No hydronephrosis. Kidneys demonstrate symmetric enhancement. Mild wall thickening of the urinary bladder. Stomach/Bowel: Radiopaque enteric contrast material traverses the cecum. Stomach is unremarkable for minimal distension. No pathologic dilation of small or large bowel. Mild wall thickening with a small amount of fluid in the sigmoid colon. Question of an 8 mm enhancing mural nodule in the sigmoid colon on image 111/2 and image 94/5. Vascular/Lymphatic: Scattered aortic atherosclerosis. Smooth IVC contours. No pathologically enlarged abdominal or pelvic lymph nodes. Prominent inguinal lymph nodes are similar prior for instance a left inguinal lymph  node measuring 10 mm in short axis on image 119 with preservation of its fatty hilum, unchanged from prior examination. Reproductive: Uterus is surgically absent without new suspicious enhancing nodularity along the vaginal cuff. No suspicious adnexal mass. Other: No significant abdominopelvic free fluid. No discrete peritoneal or omental nodularity. Postsurgical change in the anterior abdominal wall. Musculoskeletal: No aggressive lytic or blastic lesion of bone. Multilevel degenerative changes spine. Degenerative change of the hips. Chronic osseous changes of the pubic symphysis. IMPRESSION: 1. Status post hysterectomy without evidence of local recurrence. 2. No convincing evidence of metastatic disease in the chest, abdomen or pelvis. 3. Question of an 8 mm enhancing mural nodule in the sigmoid colon, suggest further evaluation with colonoscopy. 4. Mild wall thickening of the urinary bladder, correlate with urinalysis to exclude cystitis. 5. Slightly increased soft tissue in the anterior mediastinum which conforms to the underlying aorta measuring 10 x 9 mm, possibly reflecting rebound thymic tissue. Suggest continued attention on follow-up imaging. 6.  Aortic Atherosclerosis (ICD10-I70.0). These results will be called to the ordering clinician or representative by the Radiologist Assistant, and communication documented in the PACS or [employer]. Electronically Signed   By: Reyes Holder M.D.   On: 05/26/2023 13:30      12/29/2023 Imaging   CT ABDOMEN PELVIS W CONTRAST Result Date: 01/03/2024 CLINICAL DATA:  Follow-up cervical carcinoma. Currently undergoing immunotherapy. * Tracking Code: BO * EXAM: CT ABDOMEN AND PELVIS WITH CONTRAST TECHNIQUE: Multidetector CT imaging of the abdomen and pelvis was performed using the standard protocol following bolus administration of intravenous contrast. RADIATION DOSE REDUCTION: This exam was performed according to the departmental dose-optimization program which  includes automated exposure control, adjustment of the mA and/or kV according to patient size and/or use of iterative reconstruction technique. CONTRAST:  OMNIPAQUE  IOHEXOL  300 MG/ML  SOLN COMPARISON:  05/26/2023 FINDINGS: Lower Chest: New heterogeneous multifocal airspace disease is seen throughout the visualized portions of both lung bases which are new and suspicious for  drug reaction or infection. No pleural effusion. Hepatobiliary: No suspicious hepatic masses identified. Gallbladder is unremarkable. No evidence of biliary ductal dilatation. Pancreas:  No mass or inflammatory changes. Spleen: Within normal limits in size and appearance. Adrenals/Urinary Tract: No suspicious masses identified. No evidence of ureteral calculi or hydronephrosis. Unremarkable unopacified urinary bladder. Stomach/Bowel: No evidence of obstruction, inflammatory process or abnormal fluid collections. Vascular/Lymphatic: No pathologically enlarged lymph nodes. No acute vascular findings. Reproductive: Prior hysterectomy noted. No mass identified within the hysterectomy bed. Adnexal regions are unremarkable in appearance. Other:  None. Musculoskeletal:  No suspicious bone lesions identified. IMPRESSION: No evidence of recurrent or metastatic carcinoma within the abdomen or pelvis. New heterogeneous airspace disease throughout the visualized portions of both lung bases, suspicious for drug reaction or infection. Electronically Signed   By: Norleen DELENA Kil M.D.   On: 01/03/2024 09:32       I discussed the assessment and treatment plan with the patient. The patient was provided an opportunity to ask questions and all were answered. The patient agreed with the plan and demonstrated an understanding of the instructions. The patient was advised to call back or seek an in-person evaluation if the symptoms worsen or if the condition fails to improve as anticipated.    I spent 40 minutes for the appointment reviewing test results,  discuss management and coordination of care.  Almarie Bedford, MD 01/05/2024 2:10 PM

## 2024-01-05 NOTE — Assessment & Plan Note (Signed)
 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

## 2024-01-05 NOTE — Assessment & Plan Note (Signed)
 CT imaging of her lungs revealed significant signs of changes suspicious for either drug reaction versus infection She is symptomatic with significant productive cough I recommend a course of antibiotics and high-dose prednisone Her treatment is placed on hold I will see her again in 2 weeks for further follow-up

## 2024-01-06 ENCOUNTER — Other Ambulatory Visit: Payer: Self-pay

## 2024-01-14 ENCOUNTER — Other Ambulatory Visit: Payer: Self-pay

## 2024-01-19 ENCOUNTER — Inpatient Hospital Stay (HOSPITAL_BASED_OUTPATIENT_CLINIC_OR_DEPARTMENT_OTHER): Admitting: Hematology and Oncology

## 2024-01-19 ENCOUNTER — Encounter: Payer: Self-pay | Admitting: Hematology and Oncology

## 2024-01-19 ENCOUNTER — Ambulatory Visit

## 2024-01-19 ENCOUNTER — Inpatient Hospital Stay: Attending: Gynecologic Oncology

## 2024-01-19 VITALS — BP 149/67 | HR 53 | Temp 98.3°F | Resp 18 | Ht 63.0 in | Wt 221.0 lb

## 2024-01-19 DIAGNOSIS — E538 Deficiency of other specified B group vitamins: Secondary | ICD-10-CM | POA: Insufficient documentation

## 2024-01-19 DIAGNOSIS — C55 Malignant neoplasm of uterus, part unspecified: Secondary | ICD-10-CM | POA: Insufficient documentation

## 2024-01-19 DIAGNOSIS — Z923 Personal history of irradiation: Secondary | ICD-10-CM | POA: Insufficient documentation

## 2024-01-19 DIAGNOSIS — E039 Hypothyroidism, unspecified: Secondary | ICD-10-CM | POA: Insufficient documentation

## 2024-01-19 DIAGNOSIS — C7800 Secondary malignant neoplasm of unspecified lung: Secondary | ICD-10-CM | POA: Diagnosis present

## 2024-01-19 DIAGNOSIS — D539 Nutritional anemia, unspecified: Secondary | ICD-10-CM

## 2024-01-19 LAB — CBC WITH DIFFERENTIAL (CANCER CENTER ONLY)
Abs Immature Granulocytes: 0.06 K/uL (ref 0.00–0.07)
Basophils Absolute: 0.1 K/uL (ref 0.0–0.1)
Basophils Relative: 1 %
Eosinophils Absolute: 0.1 K/uL (ref 0.0–0.5)
Eosinophils Relative: 1 %
HCT: 31.2 % — ABNORMAL LOW (ref 36.0–46.0)
Hemoglobin: 9.6 g/dL — ABNORMAL LOW (ref 12.0–15.0)
Immature Granulocytes: 1 %
Lymphocytes Relative: 24 %
Lymphs Abs: 2 K/uL (ref 0.7–4.0)
MCH: 20.3 pg — ABNORMAL LOW (ref 26.0–34.0)
MCHC: 30.8 g/dL (ref 30.0–36.0)
MCV: 66.1 fL — ABNORMAL LOW (ref 80.0–100.0)
Monocytes Absolute: 0.5 K/uL (ref 0.1–1.0)
Monocytes Relative: 6 %
Neutro Abs: 5.5 K/uL (ref 1.7–7.7)
Neutrophils Relative %: 67 %
Platelet Count: 325 K/uL (ref 150–400)
RBC: 4.72 MIL/uL (ref 3.87–5.11)
RDW: 23.3 % — ABNORMAL HIGH (ref 11.5–15.5)
WBC Count: 8.1 K/uL (ref 4.0–10.5)
nRBC: 0 % (ref 0.0–0.2)

## 2024-01-19 LAB — CMP (CANCER CENTER ONLY)
ALT: 14 U/L (ref 0–44)
AST: 11 U/L — ABNORMAL LOW (ref 15–41)
Albumin: 3.4 g/dL — ABNORMAL LOW (ref 3.5–5.0)
Alkaline Phosphatase: 66 U/L (ref 38–126)
Anion gap: 5 (ref 5–15)
BUN: 20 mg/dL (ref 8–23)
CO2: 28 mmol/L (ref 22–32)
Calcium: 9 mg/dL (ref 8.9–10.3)
Chloride: 108 mmol/L (ref 98–111)
Creatinine: 0.93 mg/dL (ref 0.44–1.00)
GFR, Estimated: >60 mL/min (ref >60)
Glucose, Bld: 99 mg/dL (ref 70–99)
Potassium: 3.6 mmol/L (ref 3.5–5.1)
Sodium: 141 mmol/L (ref 135–145)
Total Bilirubin: 0.3 mg/dL (ref 0.0–1.2)
Total Protein: 6.4 g/dL — ABNORMAL LOW (ref 6.5–8.1)

## 2024-01-19 LAB — TSH: TSH: 9.47 u[IU]/mL — ABNORMAL HIGH (ref 0.350–4.500)

## 2024-01-19 MED ORDER — HEPARIN SOD (PORK) LOCK FLUSH 100 UNIT/ML IV SOLN
500.0000 [IU] | Freq: Once | INTRAVENOUS | Status: AC
  Administered 2024-01-19: 500 [IU]

## 2024-01-19 MED ORDER — SODIUM CHLORIDE 0.9% FLUSH
10.0000 mL | Freq: Once | INTRAVENOUS | Status: AC
Start: 2024-01-19 — End: 2024-01-19
  Administered 2024-01-19: 10 mL

## 2024-01-19 MED ORDER — PREDNISONE 20 MG PO TABS: ORAL_TABLET | ORAL | Status: DC

## 2024-01-19 MED ORDER — CYANOCOBALAMIN 1000 MCG/ML IJ SOLN
1000.0000 ug | Freq: Once | INTRAMUSCULAR | Status: AC
  Administered 2024-01-19: 1000 ug via INTRAMUSCULAR
  Filled 2024-01-19: qty 1

## 2024-01-19 MED ORDER — LEVOTHYROXINE SODIUM 175 MCG PO TABS: 175.0000 ug | ORAL_TABLET | Freq: Every day | ORAL | 1 refills | Status: DC

## 2024-01-19 NOTE — Patient Instructions (Signed)
Implanted [facility] Guide An implanted port is a device that is placed under the skin. It is usually placed in the chest. The device may vary based on the need. Implanted ports can be used to give IV medicine, to take blood, or to give fluids. You may have an implanted port if: You need IV medicine that would be irritating to the small veins in your hands or arms. You need IV medicines, such as chemotherapy, for a long period of time. You need IV nutrition for a long period of time. You may have fewer limitations when using a port than you would if you used other types of long-term IVs. You will also likely be able to return to normal activities after your incision heals. An implanted port has two main parts: Reservoir. The reservoir is the part where a needle is inserted to give medicines or draw blood. The reservoir is round. After the port is placed, it appears as a small, raised area under your skin. Catheter. The catheter is a small, thin tube that connects the reservoir to a vein. Medicine that is inserted into the reservoir goes into the catheter and then into the vein. How is my port accessed? To access your port: A numbing cream may be placed on the skin over the port site. Your health care provider will put on a mask and sterile gloves. The skin over your port will be cleaned carefully with a germ-killing soap and allowed to dry. Your health care provider will gently pinch the port and insert a needle into it. Your health care provider will check for a blood return to make sure the port is in the vein and is still working (patent). If your port needs to remain accessed to get medicine continuously (constant infusion), your health care provider will place a clear bandage (dressing) over the needle site. The dressing and needle will need to be changed every week, or as told by your health care provider. What is flushing? Flushing helps keep the port working. Follow instructions from your  health care provider about how and when to flush the port. Ports are usually flushed with saline solution or a medicine called heparin. The need for flushing will depend on how the port is used: If the port is only used from time to time to give medicines or draw blood, the port may need to be flushed: Before and after medicines have been given. Before and after blood has been drawn. As part of routine maintenance. Flushing may be recommended every 4-6 weeks. If a constant infusion is running, the port may not need to be flushed. Throw away any syringes in a disposal container that is meant for sharp items (sharps container). You can buy a sharps container from a pharmacy, or you can make one by using an empty hard plastic bottle with a cover. How long will my port stay implanted? The port can stay in for as long as your health care provider thinks it is needed. When it is time for the port to come out, a surgery will be done to remove it. The surgery will be similar to the procedure that was done to put the port in. Follow these instructions at home: Caring for your port and port site Flush your port as told by your health care provider. If you need an infusion over several days, follow instructions from your health care provider about how to take care of your port site. Make sure you: Change your  dressing as told by your health care provider. Wash your hands with soap and water for at least 20 seconds before and after you change your dressing. If soap and water are not available, use alcohol-based hand sanitizer. Place any used dressings or infusion bags into a plastic bag. Throw that bag in the trash. Keep the dressing that covers the needle clean and dry. Do not get it wet. Do not use scissors or sharp objects near the infusion tubing. Keep any external tubes clamped, unless they are being used. Check your port site every day for signs of infection. Check for: Redness, swelling, or  pain. Fluid or blood. Warmth. Pus or a bad smell. Protect the skin around the port site. Avoid wearing bra straps that rub or irritate the site. Protect the skin around your port from seat belts. Place a soft pad over your chest if needed. Bathe or shower as told by your health care provider. The site may get wet as long as you are not actively receiving an infusion. General instructions  Return to your normal activities as told by your health care provider. Ask your health care provider what activities are safe for you. Carry a medical alert card or wear a medical alert bracelet at all times. This will let health care providers know that you have an implanted port in case of an emergency. Where to find more information American Cancer Society: www.cancer.org American Society of Clinical Oncology: www.cancer.net Contact a health care provider if: You have a fever or chills. You have redness, swelling, or pain at the port site. You have fluid or blood coming from your port site. Your incision feels warm to the touch. You have pus or a bad smell coming from the port site. Summary Implanted ports are usually placed in the chest for long-term IV access. Follow instructions from your health care provider about flushing the port and changing bandages (dressings). Take care of the area around your port by avoiding clothing that puts pressure on the area, and by watching for signs of infection. Protect the skin around your port from seat belts. Place a soft pad over your chest if needed. Contact a health care provider if you have a fever or you have redness, swelling, pain, fluid, or a bad smell at the port site. This information is not intended to replace advice given to you by your health care provider. Make sure you discuss any questions you have with your health care provider. Document Revised: 12/29/2020 Document Reviewed: 12/29/2020 Elsevier Patient Education  2024 Elsevier Inc. Vitamin  B12 Injection What is this medication? Vitamin B12 (VAHY tuh min B12) prevents and treats low vitamin B12 levels in your body. It is used in people who do not get enough vitamin B12 from their diet or when their digestive tract does not absorb enough. Vitamin B12 plays an important role in maintaining the health of your nervous system and red blood cells. This medicine may be used for other purposes; ask your health care provider or pharmacist if you have questions. COMMON BRAND NAME(S): B-12 Compliance Kit, B-12 Injection Kit, Cyomin, Dodex, LA-12, Nutri-Twelve, Physicians EZ Use B-12, Primabalt, Vitamin Deficiency Injectable System - B12 What should I tell my care team before I take this medication? They need to know if you have any of these conditions: Kidney disease Leber's disease Megaloblastic anemia An unusual or allergic reaction to cyanocobalamin, cobalt, other medications, foods, dyes, or preservatives Pregnant or trying to get pregnant Breast-feeding How should I  use this medication? This medication is injected into a muscle or deeply under the skin. It is usually given in a clinic or care team's office. However, your care team may teach you how to inject yourself. Follow all instructions. Talk to your care team about the use of this medication in children. Special care may be needed. Overdosage: If you think you have taken too much of this medicine contact a poison control center or emergency room at once. NOTE: This medicine is only for you. Do not share this medicine with others. What if I miss a dose? If you are given your dose at a clinic or care team's office, call to reschedule your appointment. If you give your own injections, and you miss a dose, take it as soon as you can. If it is almost time for your next dose, take only that dose. Do not take double or extra doses. What may interact with this medication? Alcohol Colchicine This list may not describe all possible  interactions. Give your health care provider a list of all the medicines, herbs, non-prescription drugs, or dietary supplements you use. Also tell them if you smoke, drink alcohol, or use illegal drugs. Some items may interact with your medicine. What should I watch for while using this medication? Visit your care team regularly. You may need blood work done while you are taking this medication. You may need to follow a special diet. Talk to your care team. Limit your alcohol intake and avoid smoking to get the best benefit. What side effects may I notice from receiving this medication? Side effects that you should report to your care team as soon as possible: Allergic reactions--skin rash, itching, hives, swelling of the face, lips, tongue, or throat Swelling of the ankles, hands, or feet Trouble breathing Side effects that usually do not require medical attention (report to your care team if they continue or are bothersome): Diarrhea This list may not describe all possible side effects. Call your doctor for medical advice about side effects. You may report side effects to FDA at 1-800-FDA-1088. Where should I keep my medication? Keep out of the reach of children. Store at room temperature between 15 and 30 degrees C (59 and 85 degrees F). Protect from light. Throw away any unused medication after the expiration date. NOTE: This sheet is a summary. It may not cover all possible information. If you have questions about this medicine, talk to your doctor, pharmacist, or health care provider.  2024 Elsevier/Gold Standard (2021-03-09 00:00:00)

## 2024-01-19 NOTE — Assessment & Plan Note (Addendum)
 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 and recurrent disease in her pelvis and abdominal lymphadenopathy, treated with pembrolizumab  with complete response seen by February 2024  CT imaging from June 2025 showed bilateral pulmonary infiltrate, worrisome for pneumonia versus hypersensitivity reaction to immunotherapy Treatment was discontinued and she was placed on a course of antibiotics with prednisone  with complete resolution of her symptoms The plan would be to continue close monitoring with surveillance imaging study Her recent CT imaging showed no evidence of disease

## 2024-01-19 NOTE — Assessment & Plan Note (Addendum)
 She have signs of worsening microcytic anemia I will order iron  studies to rule out iron  deficiency in her next visit

## 2024-01-19 NOTE — Assessment & Plan Note (Addendum)
 She has chronic vitamin B12 deficiency and has been receiving vitamin B12 injection periodically every other month We will coincide her treatment with port flushes

## 2024-01-19 NOTE — Assessment & Plan Note (Addendum)
 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

## 2024-01-19 NOTE — Progress Notes (Signed)
 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 and recurrent disease in her pelvis and abdominal lymphadenopathy, treated with pembrolizumab  with complete response seen by February 2024  CT imaging from June 2025 showed bilateral pulmonary infiltrate, worrisome for pneumonia versus hypersensitivity reaction to immunotherapy Treatment was discontinued and she was placed on a course of antibiotics with prednisone  with complete resolution of her symptoms The plan would be to continue close monitoring with surveillance imaging study Her recent CT imaging showed no evidence of disease Vitamin B12 deficiency She has chronic vitamin B12 deficiency and has been receiving vitamin B12 injection periodically every other month We will coincide her treatment with port flushes Deficiency anemia She have signs of worsening microcytic anemia I will order iron  studies to rule out iron  deficiency in her next visit Hypothyroidism, unspecified type 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 I refilled her prescription today TSH fluctuate up and down but she will continue current dose for now  Orders Placed This Encounter  Procedures   Iron  and Iron  Binding Capacity ([facility] only)    Standing Status:   Future    Expected Date:   03/15/2024    Expiration Date:   01/18/2025   Ferritin    Standing Status:   Future    Expected Date:    03/15/2024    Expiration Date:   01/18/2025     Almarie Bedford, MD  INTERVAL HISTORY: she returns for surveillance follow-up Since last time I saw her, she felt better Her cough has resolved She has extra energy from the prednisone  Denies shortness of breath I reviewed CT imaging and discussed plan of care with the patient  PHYSICAL EXAMINATION: ECOG PERFORMANCE STATUS: 0 - Asymptomatic  Vitals:   01/19/24 1059  BP: (!) 149/67  Pulse: (!) 53  Resp: 18  Temp: 98.3 F (36.8 C)  SpO2: 100%   Filed Weights   01/19/24 1059  Weight: 221 lb (100.2 kg)    Relevant data reviewed during this visit included CBC, CMP, TSH, CT imaging from June 2025

## 2024-01-20 LAB — T4: T4, Total: 5.1 ug/dL (ref 4.5–12.0)

## 2024-01-22 ENCOUNTER — Other Ambulatory Visit: Payer: Self-pay | Admitting: Hematology and Oncology

## 2024-02-02 ENCOUNTER — Other Ambulatory Visit: Payer: Self-pay | Admitting: Hematology and Oncology

## 2024-02-09 ENCOUNTER — Inpatient Hospital Stay: Attending: Gynecologic Oncology | Admitting: Hematology and Oncology

## 2024-02-09 ENCOUNTER — Encounter: Payer: Self-pay | Admitting: Hematology and Oncology

## 2024-02-09 DIAGNOSIS — Z90710 Acquired absence of both cervix and uterus: Secondary | ICD-10-CM | POA: Insufficient documentation

## 2024-02-09 DIAGNOSIS — J8489 Other specified interstitial pulmonary diseases: Secondary | ICD-10-CM

## 2024-02-09 DIAGNOSIS — E538 Deficiency of other specified B group vitamins: Secondary | ICD-10-CM | POA: Insufficient documentation

## 2024-02-09 DIAGNOSIS — C7800 Secondary malignant neoplasm of unspecified lung: Secondary | ICD-10-CM | POA: Diagnosis present

## 2024-02-09 DIAGNOSIS — C55 Malignant neoplasm of uterus, part unspecified: Secondary | ICD-10-CM | POA: Diagnosis present

## 2024-02-09 NOTE — Assessment & Plan Note (Signed)
 She is not symptomatic We will continue prednisone  taper Starting September 25, she is placed on 10 mg of prednisone  Starting August 2 tomorrow, she will reduce prednisone  to 5 mg daily and stop on her birthday on [birth year]

## 2024-02-09 NOTE — Assessment & Plan Note (Signed)
 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 and recurrent disease in her pelvis and abdominal lymphadenopathy, treated with pembrolizumab  with complete response seen by February 2024  CT imaging from June 2025 showed bilateral pulmonary infiltrate, worrisome for pneumonia versus hypersensitivity reaction to immunotherapy Treatment was discontinued and she was placed on a course of antibiotics with prednisone  with complete resolution of her symptoms The plan would be to continue close monitoring with surveillance imaging study Her recent CT imaging showed no evidence of disease The plan would be to discontinue immunotherapy permanently and placed on observation I plan to see her again in September for further follow-up

## 2024-02-09 NOTE — Progress Notes (Signed)
 HEMATOLOGY-ONCOLOGY ELECTRONIC VISIT PROGRESS NOTE  Patient Care Team: Pcp, No as PCP - General  I connected with the patient via telephone conference and verified that I am speaking with the correct person using two identifiers. The patient's location is at home and I am providing care from the [facility] I discussed the limitations, risks, security and privacy concerns of performing an evaluation and management service by e-visits and the availability of in person appointments.  I also discussed with the patient that there may be a patient responsible charge related to this service. The patient expressed understanding and agreed to proceed.   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 and recurrent disease in her pelvis and abdominal lymphadenopathy, treated with pembrolizumab  with complete response seen by February 2024  CT imaging from June 2025 showed bilateral pulmonary infiltrate, worrisome for pneumonia versus hypersensitivity reaction to immunotherapy Treatment was discontinued and she was placed on a course of antibiotics with prednisone  with complete resolution of her symptoms The plan would be to continue close monitoring with surveillance imaging study Her recent CT imaging showed no evidence of disease The plan would be to discontinue immunotherapy permanently and placed on observation I plan to see her again in September for further follow-up  Pneumonitis, interstitial (HCC) She is not symptomatic We will continue  prednisone  taper Starting September 25, she is placed on 10 mg of prednisone  Starting August 2 tomorrow, she will reduce prednisone  to 5 mg daily and stop on her birthday on [birth year]  No orders of the defined types were placed in this encounter.   INTERVAL HISTORY: Please see below for problem oriented charting. The purpose of today's discussion is to review toxicity after being placed on prednisone  for pneumonitis She is doing very well with recent prednisone  taper She denies cough or shortness of breath She is comfortable with the plan for complete prednisone  taper by August 16  SUMMARY OF ONCOLOGIC HISTORY: Oncology History Overview Note  Endometrioid cancer, 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   Uterine cancer (HCC)  01/05/2021 Imaging   Outside CT imaging  Bilateral hydronephrosis related extrinsic compression on the ureters by the enlarged uterus.   Multiple calcified uterine fibroids although peripherally enhancing diffuse decreased attenuation is noted centrally within the uterus highly suspicious for endometrial neoplasm given the current history of vaginal bleeding. Further evaluation is recommended. MRI may be helpful. Additionally tissue sampling would likely be helpful.   Hypodensity in the pancreas at the junction of the head and body of the pancreas. This may be related to volume averaging although the possibility of a cystic lesion could not be totally excluded. MRI is recommended for further evaluation.    01/05/2021 Initial Diagnosis   The patient reported at least 1 year of postmenopausal bleeding.  She does not have a primary care provider or gynecologist.  She had known fibroids and had noted that she needed a hysterectomy but had  not gotten around to seeking care for  this.  She presented to the [facility] emergency department on January 05, 2021 with new onset right lower quadrant pain and this prompted a CT scan of the abdomen and pelvis to be performed on 01/05/2021.     01/14/2021 Pathology Results   FINAL MICROSCOPIC DIAGNOSIS:   A. CERVIX, ENDOCERVIX, BIOPSY:  -  Endometrioid carcinoma, FIGO grade 1  -  See comment    01/26/2021 Imaging   MRI abdomen  1. No suspicious pancreatic mass identified. 2. Moderate to severe bilateral hydroureteronephrosis, similar to recent CT the abdomen and pelvis, presumably related to extrinsic ureteral compression from the patient's enlarged uterus, as demonstrated on recent CT the abdomen and pelvis 01/05/2021.   02/01/2021 Surgery   Surgery in [facility]  Date of Service: February 01, 2021 3:13 PM  Preoperative Diagnoses: Endometrial cancer  Postoperative Diagnoses: Endometrial cancer  Procedures: Total abdominal hysterectomy, bilateral salpingo-oophorectomy, left pelvic lymphadenectomy, right para-aortic lymphadenectomy  Surgeon: Maurilio Ship, MD  Findings: Tumor appreciated in the cervical canal. Large, broad, multi fibroid uterus with a calcified fibroid filling the posterior cul de sac, an anterior lower uterine segment fibroid, and bilateral fibroids near the level of the cardinal ligament. No evidence of intraperitoneal disease. Normal appearing bilateral tubes and ovaries. Bilateral hydroureter from uterine compression. Tumor excreted from specimen into the pelvis at time of removal. An approximately 2cm enlarged left pelvic lymph node and a 1cm enlarged right aorta caval lymph node were identified.     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 (see synoptic report and comment for further details) - Lymphovascular invasion is  present - Metastatic carcinoma is present in the left ovary (0.4 cm) - Right ovary, negative for tumor - Bilateral fallopian tubes, negative for tumor - Multiple leiomyomata (up to 8.5 cm)   B: Lymph node, aortocaval, excision - Metastatic carcinoma involving 1 lymph node (0.4 cm) (1/1)   C: Lymph node, left pelvic, excision -Metastatic carcinoma involving 1 lymph node (0.3 cm) (1/1)   Diagnosis Comment    The majority of this tumor is composed of well differentiated (overall grade 2, but with areas consistent with grade 1) endometrioid adenocarcinoma with squamous and mucinous differentiation (approximately 70%); multifocally there is abrupt transition to an undifferentiated malignancy with a high-grade monotonous noncohesive appearance (best seen in slide A2) consistent with undifferentiated carcinoma.  The overall morphologic and immunophenotypic findings are consistent with a dedifferentiated endometrial carcinoma.   Of note, the carcinoma present in the lymph nodes is well differentiated endometrioid carcinoma and the metastatic deposit in the ovary is undifferentiated carcinoma. Carcinoma is present less than 1 mm from uterine corpus serosa (in the lower uterine segment).  The assessment of the paracervical soft tissue margins is challenging due to the disrupted nature of the specimen in this area, however tumor appears present less than 1 mm from paracervical soft tissue.  No definite parametrial invasion is identified.   Immunohistochemistry performed at [facility] (with appropriate controls) demonstrates the following immunoprofile in cells of interest: Well differentiated endometrioid carcinoma: CK AE1/AE3: Positive ER: Positive (>90%, strong staining) P53: Favor wild-type (see comment below)   Dedifferentiated carcinoma: CK AE1/AE3: Very rare cells, predominantly negative ER: Negative P53: Small focus of mutant type staining in a background of wild-type staining (see comment below)    P53 staining comment:  In the well differentiated carcinoma component the staining pattern is predominantly wild-type although focally there is slightly increased levels of staining noted.  In the dedifferentiated tumor component, most of the tissue exhibits wild-type pattern staining, however there is a distinct small area that exhibits increased/overexpressed mutant pattern p53 staining.    Immunohistochemistry testing for mismatch repair (MMR) proteins is performed on block A2:   MLH1:  Loss of nuclear expression MSH2:  Intact nuclear expression MSH6:  Intact nuclear expression PMS2:  Loss of nuclear expression   Loss of nuclear expression of MLH1 and PMS2 can be a result of sporadic microsatellite instability (via hypermethylation of the MLH1 promoter region) or due to Lynch syndrome / HNPCC. Materials will be submitted to the [facility] [phone]) for MLH promoter hypermethylation testing. These results will be issued in a separate report.      03/05/2021 Initial Diagnosis   Uterine cancer (HCC)   03/05/2021 Cancer Staging   Staging form: Corpus Uteri - Carcinoma and Carcinosarcoma, AJCC 8th Edition - Pathologic stage from 03/05/2021: FIGO Stage IVB (rpT3, pN2a, cM1) - Signed by Lonn Hicks, MD on 11/12/2021 Stage prefix: Recurrence   03/11/2021 Imaging   1. Status post interval hysterectomy and oophorectomy. 2. There is a rim enhancing soft tissue mass centered in the vagina, measuring approximately 4.4 x 3.1 cm. This is new, or significantly enlarged compared to prior examination dated 01/05/2021. Findings are concerning for malignant involvement. Correlate with physical examination. Contrast enhanced MRI may be helpful to further detail soft tissue anatomy of the low pelvis if indicated. 3. There is at least one newly enlarged left iliac lymph node measuring up to 1.4 x 1.3 cm, nonspecific although concerning for nodal metastatic disease. 4. Fluid attenuation  lymphocele or seroma adjacent to surgical clip at the level right ovarian vein. 5. Status post interval midline laparotomy. Subcutaneous fluid within the inferior aspect of the wound. The presence or absence of infection is not established by CT.     03/23/2021 - 07/09/2021 Chemotherapy   Patient is on Treatment Plan : UTERINE Carboplatin  AUC 6 / Paclitaxel  q21d     03/23/2021 Procedure   Placement of a subcutaneous power-injectable port device. Catheter tip at the superior cavoatrial junction   07/30/2021 Imaging   1. Large ill-defined partially enhancing and likely centrally necrotic vaginal and paravaginal mass. Recommend correlation with direct visualization. MRI pelvis without and with contrast may be helpful for further evaluation. 2. No findings suspicious for omental or peritoneal surface disease. No adenopathy.   08/09/2021 - 09/08/2021 Radiation Therapy   Indication for treatment:  Curative       Radiation treatment dates:   08/09/21 through 09/08/21 Site/dose:  IMRT Uterus; Vagina : 41.5 Gy delivered in 23 Fx with 1.80 Gy/Fx (Planned dosage was for 45.00 GY delivered in 25 Fx with 1.80 Gy/Fx, her final 2 treatments (and boost) was canceled secondary to significant radiation reaction)   Beams/energy: 6X   11/11/2021 Imaging   1. 11 mm left upper lobe pulmonary nodule in the apicoposterior segment abuts the posterior pleural margin. In this clinical context the appearance is very concerning for a metastatic lesion although we do not have prior cross-sectional imaging to assess stability. Proximity to the fifth rib and adjacent major fissure might make percutaneous biopsy tricky; PET-CT would also be one way of assessing the metabolic activity/malignant potential of this lesion. This may be a good candidate case for discussion at multidisciplinary cancer conference. 2. Increased adenopathy along the left  iliac chain, including an internal iliac node which abuts the left sacral plexus. 3. The  lower vaginal and vestibular mass measures about 7.0 by 5.5 by 5.9 cm and has some internal gas which may indicate ulceration along the vestibule/perineum. Poor definition of tissue planes between this process in the base of the urinary bladder as well as the anus. The mass extends along the left anterior obturator internus muscle. 4. Other imaging findings of potential clinical significance: Prominent main pulmonary artery, cannot exclude pulmonary arterial hypertension. Aortic Atherosclerosis (ICD10-I70.0). Lower lumbar spondylosis and degenerative disc disease.   11/19/2021 - 03/11/2022 Chemotherapy   Patient is on Treatment Plan : UTERINE Pembrolizumab  (200) q21d     11/19/2021 - 11/24/2023 Chemotherapy   Patient is on Treatment Plan : UTERINE Pembrolizumab  (400) q42d     01/21/2022 Imaging   1. Status post hysterectomy. Generally diminished size of an ulcerated mass involving the vagina when compared to prior examination, difficult to accurately measure due to poor resolution of adjacent soft tissues. 2. Significant interval decrease in size of left pelvic sidewall and iliac lymphadenopathy. 3. Interval enlargement of a pulmonary nodule of the posterior left upper lobe. 4. 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 5. New, diffuse urinary bladder wall thickening and adjacent fat stranding, consistent with nonspecific infectious or inflammatory cystitis, most likely related to local radiation therapy.   04/22/2022 Imaging   1. Resolution of previously noted soft tissue mass associated with the apex of the vagina. 2. Regression of previously noted left upper lobe pulmonary nodule. However, there are multiple new satellite nodules in the adjacent lung parenchyma in the upper left lung, largest of which measures 1.2 x 0.6 cm in the superior  segment of the left lower lobe. Close attention on follow-up studies is recommended. 3. Interval development of mildly enlarged bilateral inguinal lymph nodes. These could be reactive, however, the possibility of locoregional nodal metastases is not excluded, and close attention on follow-up imaging is recommended to ensure regression of these findings. 4. Aortic atherosclerosis, in addition to left anterior descending coronary artery disease. Assessment for potential risk factor modification, dietary therapy or pharmacologic therapy may be warranted, if clinically indicated.  5. Additional incidental findings, as above.   08/29/2022 Imaging   1. Previously noted clustered nodularity in the upper left lung has resolved compared to the prior study. However, today's study demonstrates a new mass-like area of architectural distortion with some internal air bronchograms and cavitation in the posterior aspect of the left upper lobe in the same region. Although this is mass-like in appearance on axial images, this is rather linear in appearance on coronal and sagittal reconstructions. If there has been radiation therapy to this region, this could simply represent an area of evolving postradiation mass-like fibrosis. In the absence of a history of radiation therapy, these findings are presumably of infectious etiology. Neoplasm is not favored, but difficult to entirely exclude. Close attention on follow-up studies is recommended to evaluate for the evolution of these findings. No other suspicious appearing pulmonary nodules or masses are noted on today's examination. 2. No definite signs of metastatic disease noted in the abdomen or pelvis. 3. Multiple prominent borderline enlarged and minimally enlarged bilateral inguinal lymph nodes, slightly less prominent than the prior examination. Continued attention on follow-up studies is recommended. 4. Severe circumferential thickening of the urinary bladder wall without  discrete bladder wall mass. This is of uncertain etiology and significance, but correlation with urinalysis is recommended to exclude the possibility of cystitis. Attention on follow-up imaging is recommended to ensure resolution. 5. Aortic atherosclerosis, in addition to left anterior descending coronary artery disease. Please note that although the presence of coronary artery calcium documents the presence of coronary artery disease, the severity of this disease and any potential stenosis cannot be assessed on this non-gated CT examination. Assessment for potential risk factor modification, dietary therapy or pharmacologic therapy may be warranted, if clinically indicated. 6. Additional incidental findings, as above.     12/23/2022 Imaging   CT ABDOMEN PELVIS W CONTRAST  Result Date: 12/22/2022 CLINICAL DATA:  Cervical cancer; * Tracking Code: BO * EXAM: CT ABDOMEN AND PELVIS WITH CONTRAST TECHNIQUE: Multidetector CT imaging of the abdomen and pelvis was performed using the standard protocol following bolus administration of intravenous contrast. RADIATION DOSE REDUCTION: This exam was performed according to the departmental dose-optimization program which includes automated exposure control, adjustment of the mA and/or kV according to patient size and/or use of iterative reconstruction technique. CONTRAST:  OMNIPAQUE  IOHEXOL  300 MG/ML  SOLN COMPARISON:  CT chest, abdomen and pelvis dated August 29, 2022 FINDINGS: Lower chest: Small hiatal hernia.  No acute abnormality. Hepatobiliary: No focal liver abnormality is seen. No gallstones, gallbladder wall thickening, or biliary dilatation. Pancreas: Relative hypoattenuation of the pancreatic head, unchanged when compared with multiple priors and likely due to focal fatty atrophy. No pancreatic ductal dilatation or surrounding inflammatory changes. Spleen: Normal in size without focal abnormality. Adrenals/Urinary Tract: Bilateral adrenal glands are  unremarkable. Hydronephrosis or nephrolithiasis. Bladder is unremarkable. Stomach/Bowel: Stomach is within normal limits. Appendix appears normal. No evidence of bowel wall thickening, distention, or inflammatory changes. Vascular/Lymphatic: Aortic atherosclerosis. No enlarged abdominal or pelvic lymph nodes. Reproductive: Status post hysterectomy. No adnexal masses. Other: No abdominal wall hernia or abnormality. No abdominopelvic ascites. Musculoskeletal: No acute or significant osseous findings. IMPRESSION: 1. No evidence of metastatic disease in the abdomen or pelvis. 2. Stable prominent bilateral inguinal lymph nodes. 3. Aortic Atherosclerosis (ICD10-I70.0). Electronically Signed   By: Rea Marc M.D.   On: 12/22/2022 15:38      05/26/2023 Imaging   CT CHEST ABDOMEN PELVIS W CONTRAST  Result Date: 05/26/2023 CLINICAL DATA:  History of cervical cancer, follow-up. * Tracking Code: BO * EXAM: CT CHEST, ABDOMEN, AND PELVIS WITH CONTRAST TECHNIQUE: Multidetector CT imaging of the chest, abdomen and pelvis was performed following the standard protocol during bolus administration of intravenous contrast. RADIATION DOSE REDUCTION: This exam was performed according to the departmental dose-optimization program which includes automated exposure control, adjustment of the mA and/or kV according to patient size and/or use of iterative reconstruction technique. CONTRAST:  OMNIPAQUE  IOHEXOL  300 MG/ML  SOLN COMPARISON:  Multiple priors including most recent CT December 21, 2022 and August 29, 2022. FINDINGS: CT CHEST FINDINGS Cardiovascular: Accessed right chest Port-A-Cath with tip near the superior cavoatrial junction. Aortic atherosclerosis. Normal caliber thoracic aorta. No central pulmonary embolus on this nondedicated study. Normal size heart. No significant pericardial effusion/thickening. Mediastinum/Nodes: No suspicious thyroid  nodule. No pathologically enlarged mediastinal, hilar or axillary lymph  nodes. Slightly increased soft tissue in the anterior mediastinum which conforms to the underlying aorta measuring 10 x 9 mm on image 22/2, possibly reflecting rebound thymic tissue. Small hiatal hernia. Lungs/Pleura: Similar scarring/atelectasis in the left upper lobe on image 47/4. No new suspicious pulmonary nodules or masses. No pleural effusion. No pneumothorax. Musculoskeletal:  No aggressive lytic or blastic lesion of bone. Degenerative change of the spine. CT ABDOMEN PELVIS FINDINGS Hepatobiliary: No suspicious hepatic lesion. Gallbladder is unremarkable. No biliary ductal dilation. Pancreas: Similar hypodensity in the pancreatic head favored to reflect fatty infiltration. No pancreatic ductal dilation or evidence of acute inflammation. Spleen: No splenomegaly. Adrenals/Urinary Tract: Bilateral adrenal glands appear normal. No hydronephrosis. Kidneys demonstrate symmetric enhancement. Mild wall thickening of the urinary bladder. Stomach/Bowel: Radiopaque enteric contrast material traverses the cecum. Stomach is unremarkable for minimal distension. No pathologic dilation of small or large bowel. Mild wall thickening with a small amount of fluid in the sigmoid colon. Question of an 8 mm enhancing mural nodule in the sigmoid colon on image 111/2 and image 94/5. Vascular/Lymphatic: Scattered aortic atherosclerosis. Smooth IVC contours. No pathologically enlarged abdominal or pelvic lymph nodes. Prominent inguinal lymph nodes are similar prior for instance a left inguinal lymph node measuring 10 mm in short axis on image 119 with preservation of its fatty hilum, unchanged from prior examination. Reproductive: Uterus is surgically absent without new suspicious enhancing nodularity along the vaginal cuff. No suspicious adnexal mass. Other: No significant abdominopelvic free fluid. No discrete peritoneal or omental nodularity. Postsurgical change in the anterior abdominal wall. Musculoskeletal: No aggressive lytic or  blastic lesion of bone. Multilevel degenerative changes spine. Degenerative change of the hips. Chronic osseous changes of the pubic symphysis. IMPRESSION: 1. Status post hysterectomy without evidence of local recurrence. 2. No convincing evidence of metastatic disease in the chest, abdomen or pelvis. 3. Question of an 8 mm enhancing mural nodule in the sigmoid colon, suggest further evaluation with colonoscopy. 4. Mild wall thickening of the urinary bladder, correlate with urinalysis to exclude cystitis. 5. Slightly increased soft tissue in the anterior mediastinum which conforms to the underlying aorta measuring 10 x 9 mm, possibly reflecting rebound thymic tissue. Suggest continued attention on follow-up imaging. 6.  Aortic Atherosclerosis (ICD10-I70.0). These results will be called to the ordering clinician or representative by the Radiologist Assistant, and communication documented in the PACS or [employer]. Electronically Signed   By: Reyes Holder M.D.   On: 05/26/2023 13:30      12/29/2023 Imaging   CT ABDOMEN PELVIS W CONTRAST Result Date: 01/03/2024 CLINICAL DATA:  Follow-up cervical carcinoma. Currently undergoing immunotherapy. * Tracking Code: BO * EXAM: CT ABDOMEN AND PELVIS WITH CONTRAST TECHNIQUE: Multidetector CT imaging of the abdomen and pelvis was performed using the standard protocol following bolus administration of intravenous contrast. RADIATION DOSE REDUCTION: This exam was performed according to the departmental dose-optimization program which includes automated exposure control, adjustment of the mA and/or kV according to patient size and/or use of iterative reconstruction technique. CONTRAST:  OMNIPAQUE  IOHEXOL  300 MG/ML  SOLN COMPARISON:  05/26/2023 FINDINGS: Lower Chest: New heterogeneous multifocal airspace disease is seen throughout the visualized portions of both lung bases which are new and suspicious for drug reaction or infection. No pleural effusion.  Hepatobiliary: No suspicious hepatic masses identified. Gallbladder is unremarkable. No evidence of biliary ductal dilatation. Pancreas:  No mass or inflammatory changes. Spleen: Within normal limits in size and appearance. Adrenals/Urinary Tract: No suspicious masses identified. No evidence of ureteral calculi or hydronephrosis. Unremarkable unopacified urinary bladder. Stomach/Bowel: No evidence of obstruction, inflammatory process or abnormal fluid collections. Vascular/Lymphatic: No pathologically enlarged lymph nodes. No acute vascular findings. Reproductive: Prior hysterectomy noted. No mass identified within the hysterectomy bed. Adnexal regions are unremarkable in appearance. Other:  None. Musculoskeletal:  No suspicious bone lesions identified. IMPRESSION: No  evidence of recurrent or metastatic carcinoma within the abdomen or pelvis. New heterogeneous airspace disease throughout the visualized portions of both lung bases, suspicious for drug reaction or infection. Electronically Signed   By: Norleen DELENA Kil M.D.   On: 01/03/2024 09:32       I discussed the assessment and treatment plan with the patient. The patient was provided an opportunity to ask questions and all were answered. The patient agreed with the plan and demonstrated an understanding of the instructions. The patient was advised to call back or seek an in-person evaluation if the symptoms worsen or if the condition fails to improve as anticipated.    I spent 20 minutes for the appointment reviewing test results, discuss management and coordination of care.  Almarie Bedford, MD 02/09/2024 1:14 PM

## 2024-03-05 ENCOUNTER — Other Ambulatory Visit: Payer: Self-pay | Admitting: Hematology and Oncology

## 2024-03-15 ENCOUNTER — Encounter: Payer: Self-pay | Admitting: Hematology and Oncology

## 2024-03-15 ENCOUNTER — Inpatient Hospital Stay: Attending: Gynecologic Oncology

## 2024-03-15 ENCOUNTER — Inpatient Hospital Stay: Admitting: Hematology and Oncology

## 2024-03-15 ENCOUNTER — Inpatient Hospital Stay

## 2024-03-15 ENCOUNTER — Other Ambulatory Visit: Payer: Self-pay

## 2024-03-15 ENCOUNTER — Ambulatory Visit: Payer: Self-pay | Admitting: Hematology and Oncology

## 2024-03-15 VITALS — BP 127/65 | HR 73 | Temp 98.4°F | Resp 18 | Ht 63.0 in | Wt 229.4 lb

## 2024-03-15 DIAGNOSIS — E538 Deficiency of other specified B group vitamins: Secondary | ICD-10-CM | POA: Diagnosis not present

## 2024-03-15 DIAGNOSIS — C7800 Secondary malignant neoplasm of unspecified lung: Secondary | ICD-10-CM | POA: Diagnosis present

## 2024-03-15 DIAGNOSIS — C55 Malignant neoplasm of uterus, part unspecified: Secondary | ICD-10-CM | POA: Insufficient documentation

## 2024-03-15 DIAGNOSIS — E039 Hypothyroidism, unspecified: Secondary | ICD-10-CM | POA: Diagnosis not present

## 2024-03-15 DIAGNOSIS — D539 Nutritional anemia, unspecified: Secondary | ICD-10-CM

## 2024-03-15 LAB — IRON AND IRON BINDING CAPACITY (CC-WL,HP ONLY)
Iron: 10 ug/dL — ABNORMAL LOW (ref 28–170)
Saturation Ratios: 4 % — ABNORMAL LOW (ref 10.4–31.8)
TIBC: 258 ug/dL (ref 250–450)
UIBC: 248 ug/dL (ref 148–442)

## 2024-03-15 LAB — COMPREHENSIVE METABOLIC PANEL WITH GFR
ALT: 24 U/L (ref 0–44)
AST: 17 U/L (ref 15–41)
Albumin: 3.9 g/dL (ref 3.5–5.0)
Alkaline Phosphatase: 96 U/L (ref 38–126)
Anion gap: 7 (ref 5–15)
BUN: 9 mg/dL (ref 8–23)
CO2: 25 mmol/L (ref 22–32)
Calcium: 8.9 mg/dL (ref 8.9–10.3)
Chloride: 105 mmol/L (ref 98–111)
Creatinine, Ser: 0.78 mg/dL (ref 0.44–1.00)
GFR, Estimated: >60 mL/min (ref >60)
Glucose, Bld: 90 mg/dL (ref 70–99)
Potassium: 3.8 mmol/L (ref 3.5–5.1)
Sodium: 137 mmol/L (ref 135–145)
Total Bilirubin: 0.4 mg/dL (ref 0.0–1.2)
Total Protein: 7.3 g/dL (ref 6.5–8.1)

## 2024-03-15 LAB — CBC WITH DIFFERENTIAL/PLATELET
Abs Immature Granulocytes: 0.02 K/uL (ref 0.00–0.07)
Basophils Absolute: 0.1 K/uL (ref 0.0–0.1)
Basophils Relative: 1 %
Eosinophils Absolute: 0 K/uL (ref 0.0–0.5)
Eosinophils Relative: 1 %
HCT: 33.8 % — ABNORMAL LOW (ref 36.0–46.0)
Hemoglobin: 10.4 g/dL — ABNORMAL LOW (ref 12.0–15.0)
Immature Granulocytes: 0 %
Lymphocytes Relative: 21 %
Lymphs Abs: 1 K/uL (ref 0.7–4.0)
MCH: 21.5 pg — ABNORMAL LOW (ref 26.0–34.0)
MCHC: 30.8 g/dL (ref 30.0–36.0)
MCV: 70 fL — ABNORMAL LOW (ref 80.0–100.0)
Monocytes Absolute: 0.7 K/uL (ref 0.1–1.0)
Monocytes Relative: 15 %
Neutro Abs: 2.9 K/uL (ref 1.7–7.7)
Neutrophils Relative %: 62 %
Platelets: 236 K/uL (ref 150–400)
RBC: 4.83 MIL/uL (ref 3.87–5.11)
RDW: 18.3 % — ABNORMAL HIGH (ref 11.5–15.5)
WBC: 4.7 K/uL (ref 4.0–10.5)
nRBC: 0 % (ref 0.0–0.2)

## 2024-03-15 LAB — FERRITIN: Ferritin: 213 ng/mL (ref 11–307)

## 2024-03-15 LAB — TSH: TSH: 3.17 u[IU]/mL (ref 0.350–4.500)

## 2024-03-15 MED ORDER — SODIUM CHLORIDE 0.9% FLUSH: 10.0000 mL | Freq: Once | INTRAVENOUS | Status: DC

## 2024-03-15 MED ORDER — CYANOCOBALAMIN 1000 MCG/ML IJ SOLN: 1000.0000 ug | Freq: Once | INTRAMUSCULAR | Status: DC

## 2024-03-15 MED ORDER — CYANOCOBALAMIN 1000 MCG/ML IJ SOLN
1000.0000 ug | Freq: Once | INTRAMUSCULAR | Status: AC
  Administered 2024-03-15: 1000 ug via INTRAMUSCULAR
  Filled 2024-03-15: qty 1

## 2024-03-15 NOTE — Telephone Encounter (Signed)
-----   Message from Almarie Bedford sent at 03/15/2024  2:25 PM EDT ----- Pls call her, thyroid  tests and iron  studies are ok ----- Message ----- From: Interface, Lab In [place] Sent: 03/15/2024   8:21 AM EDT To: Almarie Bedford, MD

## 2024-03-15 NOTE — Telephone Encounter (Addendum)
 Called patient to relay message below as per Dr. Lonn, no answer to phone call left full message on VM and return phone number if she had any questions.  ----- Message from Almarie Lonn sent at 03/15/2024  2:25 PM EDT ----- Pls call her, thyroid  tests and iron  studies are ok ----- Message ----- From: Interface, [facility] In [place] Sent: 03/15/2024   8:21 AM EDT To: Almarie Lonn, MD

## 2024-03-15 NOTE — Assessment & Plan Note (Addendum)
 She has received vitamin B12 today I will plan to recheck it in October

## 2024-03-15 NOTE — Progress Notes (Signed)
 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 and recurrent disease in her pelvis and abdominal lymphadenopathy, treated with pembrolizumab  with complete response seen by February 2024  CT imaging from June 2025 showed bilateral pulmonary infiltrate, worrisome for pneumonia versus hypersensitivity reaction to immunotherapy Treatment was discontinued and she was placed on a course of antibiotics with prednisone  with complete resolution of her symptoms The plan would be to continue close monitoring with surveillance imaging study The plan would be to discontinue immunotherapy permanently and placed on observation I plan to repeat imaging study in October Vitamin B12 deficiency She has received vitamin B12 today I will plan to recheck it in October Hypothyroidism, unspecified type 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 TSH fluctuate up and down, repeat level today is pending, will call her with test results Deficiency anemia She had microcytic anemia with suspicious for iron  deficiency I will check iron  studies and will call her with test results  Orders Placed This Encounter  Procedures   CT CHEST ABDOMEN PELVIS W CONTRAST    Standing Status:   Future    Expected Date:   05/10/2024    Expiration Date:   03/15/2025    If indicated for the ordered procedure, I authorize the administration of contrast media per  Radiology protocol:   Yes    Does the patient have a contrast media/X-ray dye allergy?:   No    Preferred imaging location?:   [facility]    If indicated for the ordered procedure, I authorize the administration of oral contrast media per Radiology protocol:   No    Reason for no oral contrast::   no need oral contrast   Vitamin B12    Standing Status:   Standing    Number of Occurrences:   4    Expiration Date:   03/15/2025     Almarie Bedford, MD  INTERVAL HISTORY: she returns for surveillance follow-up for history of uterine cancer She denies recent cough or shortness of breath No recent vaginal bleeding Her energy level is fair We discussed timing of her next imaging and follow-up  PHYSICAL EXAMINATION: ECOG PERFORMANCE STATUS: 1 - Symptomatic but completely ambulatory  Vitals:   03/15/24 0902  BP: 127/65  Pulse: 73  Resp: 18  Temp: 98.4 F (36.9 C)  SpO2: 99%   Filed Weights   03/15/24 0902  Weight: 229 lb 6.4 oz (104.1 kg)    Relevant data reviewed during this visit included CBC, CMP, TSH, iron  studies

## 2024-03-15 NOTE — Assessment & Plan Note (Addendum)
 She had microcytic anemia with suspicious for iron  deficiency I will check iron  studies and will call her with test results

## 2024-03-15 NOTE — Assessment & Plan Note (Addendum)
 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

## 2024-03-15 NOTE — Assessment & Plan Note (Addendum)
 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 and recurrent disease in her pelvis and abdominal lymphadenopathy, treated with pembrolizumab  with complete response seen by February 2024  CT imaging from June 2025 showed bilateral pulmonary infiltrate, worrisome for pneumonia versus hypersensitivity reaction to immunotherapy Treatment was discontinued and she was placed on a course of antibiotics with prednisone  with complete resolution of her symptoms The plan would be to continue close monitoring with surveillance imaging study The plan would be to discontinue immunotherapy permanently and placed on observation I plan to repeat imaging study in October

## 2024-04-16 ENCOUNTER — Telehealth: Payer: Self-pay

## 2024-04-16 NOTE — Telephone Encounter (Signed)
 Called and given appt for port lab flush at 0930 prior to CT on 10/24. She is aware of appts.

## 2024-04-19 ENCOUNTER — Telehealth: Payer: Self-pay

## 2024-04-19 NOTE — Telephone Encounter (Signed)
 Returned her call and faxed per her request 9/5 lab results to Dr. Thelbert at [phone], received fax confirmation.

## 2024-05-03 ENCOUNTER — Inpatient Hospital Stay: Attending: Gynecologic Oncology

## 2024-05-03 ENCOUNTER — Ambulatory Visit (HOSPITAL_COMMUNITY)
Admission: RE | Admit: 2024-05-03 | Discharge: 2024-05-03 | Disposition: A | Discharge status: Home / Self Care | Source: Ambulatory Visit | Attending: Hematology and Oncology | Admitting: Hematology and Oncology

## 2024-05-03 DIAGNOSIS — C55 Malignant neoplasm of uterus, part unspecified: Secondary | ICD-10-CM | POA: Insufficient documentation

## 2024-05-03 DIAGNOSIS — E538 Deficiency of other specified B group vitamins: Secondary | ICD-10-CM | POA: Diagnosis present

## 2024-05-03 DIAGNOSIS — C7800 Secondary malignant neoplasm of unspecified lung: Secondary | ICD-10-CM | POA: Diagnosis present

## 2024-05-03 DIAGNOSIS — Z79899 Other long term (current) drug therapy: Secondary | ICD-10-CM | POA: Diagnosis not present

## 2024-05-03 DIAGNOSIS — E039 Hypothyroidism, unspecified: Secondary | ICD-10-CM | POA: Insufficient documentation

## 2024-05-03 LAB — VITAMIN B12: Vitamin B-12: 494 pg/mL (ref 180–914)

## 2024-05-03 LAB — COMPREHENSIVE METABOLIC PANEL WITH GFR
ALT: 11 U/L (ref 0–44)
AST: 12 U/L — ABNORMAL LOW (ref 15–41)
Albumin: 3.8 g/dL (ref 3.5–5.0)
Alkaline Phosphatase: 82 U/L (ref 38–126)
Anion gap: 6 (ref 5–15)
BUN: 12 mg/dL (ref 8–23)
CO2: 26 mmol/L (ref 22–32)
Calcium: 9.3 mg/dL (ref 8.9–10.3)
Chloride: 106 mmol/L (ref 98–111)
Creatinine, Ser: 0.83 mg/dL (ref 0.44–1.00)
GFR, Estimated: >60 mL/min (ref >60)
Glucose, Bld: 79 mg/dL (ref 70–99)
Potassium: 3.6 mmol/L (ref 3.5–5.1)
Sodium: 138 mmol/L (ref 135–145)
Total Bilirubin: 0.4 mg/dL (ref 0.0–1.2)
Total Protein: 7.3 g/dL (ref 6.5–8.1)

## 2024-05-03 LAB — CBC WITH DIFFERENTIAL/PLATELET
Abs Immature Granulocytes: 0.02 K/uL (ref 0.00–0.07)
Basophils Absolute: 0.1 K/uL (ref 0.0–0.1)
Basophils Relative: 1 %
Eosinophils Absolute: 0.2 K/uL (ref 0.0–0.5)
Eosinophils Relative: 4 %
HCT: 32.8 % — ABNORMAL LOW (ref 36.0–46.0)
Hemoglobin: 10.4 g/dL — ABNORMAL LOW (ref 12.0–15.0)
Immature Granulocytes: 0 %
Lymphocytes Relative: 34 %
Lymphs Abs: 1.6 K/uL (ref 0.7–4.0)
MCH: 21.7 pg — ABNORMAL LOW (ref 26.0–34.0)
MCHC: 31.7 g/dL (ref 30.0–36.0)
MCV: 68.5 fL — ABNORMAL LOW (ref 80.0–100.0)
Monocytes Absolute: 0.4 K/uL (ref 0.1–1.0)
Monocytes Relative: 10 %
Neutro Abs: 2.3 K/uL (ref 1.7–7.7)
Neutrophils Relative %: 51 %
Platelets: 308 K/uL (ref 150–400)
RBC: 4.79 MIL/uL (ref 3.87–5.11)
RDW: 16.5 % — ABNORMAL HIGH (ref 11.5–15.5)
WBC: 4.6 K/uL (ref 4.0–10.5)
nRBC: 0 % (ref 0.0–0.2)

## 2024-05-03 LAB — TSH: TSH: 31.2 u[IU]/mL — ABNORMAL HIGH (ref 0.350–4.500)

## 2024-05-03 LAB — POCT I-STAT CREATININE: Creatinine, Ser: 0.8 mg/dL (ref 0.44–1.00)

## 2024-05-03 MED ORDER — HEPARIN SOD (PORK) LOCK FLUSH 100 UNIT/ML IV SOLN
INTRAVENOUS | Status: AC
  Filled 2024-05-03: qty 5

## 2024-05-03 MED ORDER — SODIUM CHLORIDE (PF) 0.9 % IJ SOLN
INTRAMUSCULAR | Status: AC
  Filled 2024-05-03: qty 50

## 2024-05-03 MED ORDER — IOHEXOL 300 MG/ML  SOLN
100.0000 mL | Freq: Once | INTRAMUSCULAR | Status: AC | PRN
  Administered 2024-05-03: 100 mL via INTRAVENOUS

## 2024-05-06 ENCOUNTER — Ambulatory Visit: Payer: Self-pay | Admitting: Hematology and Oncology

## 2024-05-06 ENCOUNTER — Other Ambulatory Visit: Payer: Self-pay

## 2024-05-06 MED ORDER — LEVOTHYROXINE SODIUM 200 MCG PO TABS: 200.0000 ug | ORAL_TABLET | Freq: Every day | ORAL | 0 refills | Status: DC

## 2024-05-06 NOTE — Telephone Encounter (Signed)
 Called and given below message. She verbalized understanding. She is aware of next appt date/time. Sometimes she does forget to take the Synthroid  but will try to take daily. Sent new Rx to her pharmacy.

## 2024-05-10 ENCOUNTER — Inpatient Hospital Stay

## 2024-05-16 ENCOUNTER — Telehealth: Payer: Self-pay

## 2024-05-16 NOTE — Telephone Encounter (Signed)
 Returned her call. She had total knee surgery on 10/31 and is not driving yet, she is hoping to be able to drive next week. Lab and injection appt canceled for tomorrow. Changed MD visit for tomorrow to telephone call to get CT results.

## 2024-05-17 ENCOUNTER — Inpatient Hospital Stay: Admitting: Hematology and Oncology

## 2024-05-17 ENCOUNTER — Inpatient Hospital Stay

## 2024-05-17 ENCOUNTER — Inpatient Hospital Stay: Attending: Gynecologic Oncology | Admitting: Hematology and Oncology

## 2024-05-17 ENCOUNTER — Telehealth: Payer: Self-pay

## 2024-05-17 ENCOUNTER — Encounter: Payer: Self-pay | Admitting: Hematology and Oncology

## 2024-05-17 DIAGNOSIS — C7800 Secondary malignant neoplasm of unspecified lung: Secondary | ICD-10-CM | POA: Insufficient documentation

## 2024-05-17 DIAGNOSIS — E538 Deficiency of other specified B group vitamins: Secondary | ICD-10-CM | POA: Diagnosis not present

## 2024-05-17 DIAGNOSIS — E039 Hypothyroidism, unspecified: Secondary | ICD-10-CM | POA: Diagnosis not present

## 2024-05-17 DIAGNOSIS — C55 Malignant neoplasm of uterus, part unspecified: Secondary | ICD-10-CM | POA: Insufficient documentation

## 2024-05-17 NOTE — Assessment & Plan Note (Signed)
 Recent vitamin B12 level was adequate She will receive vitamin B12 injection again next month

## 2024-05-17 NOTE — Assessment & Plan Note (Signed)
 Recent TSH was high Dose was revised We will continue to monitor and adjust the dose as needed

## 2024-05-17 NOTE — Progress Notes (Signed)
 HEMATOLOGY-ONCOLOGY ELECTRONIC VISIT PROGRESS NOTE  Patient Care Team: Pcp, No as PCP - General  I connected with the patient via telephone conference and verified that I am speaking with the correct person using two identifiers. The patient's location is at home and I am providing care from the [facility] I discussed the limitations, risks, security and privacy concerns of performing an evaluation and management service by e-visits and the availability of in person appointments.  I also discussed with the patient that there may be a patient responsible charge related to this service. The patient expressed understanding and agreed to proceed.   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  Treatment was discontinued after May 2025 treatment due to pneumonitis from checkpoint inhibitors  CT imaging from October 2025 persistent pulmonary infiltrates but she is not symptomatic  The plan would be to continue close monitoring with surveillance imaging study, next imaging study would be in January 2026 She has no signs of cancer relapse  Vitamin B12 deficiency Recent vitamin B12 level was adequate She will receive vitamin B12 injection again next month  Hypothyroidism Recent TSH was high Dose was revised We will continue to monitor and adjust the dose as needed  No orders of the defined types were  placed in this encounter.   INTERVAL HISTORY: Please see below for problem oriented charting. The purpose of today's discussion is to review test results The patient is unable to make it to today's appointment and it was switched to virtual We discussed recent lab result and CT imaging result  SUMMARY OF ONCOLOGIC HISTORY: Oncology History Overview Note  Endometrioid cancer, 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   Uterine cancer (HCC)  01/05/2021 Imaging   Outside CT imaging  Bilateral hydronephrosis related extrinsic compression on the ureters by the enlarged uterus.   Multiple calcified uterine fibroids although peripherally enhancing diffuse decreased attenuation is noted centrally within the uterus highly suspicious for endometrial neoplasm given the current history of vaginal bleeding. Further evaluation is recommended. MRI may be helpful. Additionally tissue sampling would likely be helpful.   Hypodensity in the pancreas at the junction of the head and body of the pancreas. This may be related to volume averaging although the possibility of a cystic lesion could not be totally excluded. MRI is recommended for further evaluation.    01/05/2021 Initial Diagnosis   The patient reported at least 1 year of postmenopausal bleeding.  She does not have a primary care provider or gynecologist.  She had known fibroids and had noted that she needed a hysterectomy but had not gotten around to seeking care for this.  She presented to the [facility] emergency department on January 05, 2021 with new onset right lower quadrant pain and this prompted a CT scan of the abdomen and pelvis to be performed  on 01/05/2021.     01/14/2021 Pathology Results   FINAL MICROSCOPIC DIAGNOSIS:   A. CERVIX, ENDOCERVIX, BIOPSY:  -   Endometrioid carcinoma, FIGO grade 1  -  See comment    01/26/2021 Imaging   MRI abdomen  1. No suspicious pancreatic mass identified. 2. Moderate to severe bilateral hydroureteronephrosis, similar to recent CT the abdomen and pelvis, presumably related to extrinsic ureteral compression from the patient's enlarged uterus, as demonstrated on recent CT the abdomen and pelvis 01/05/2021.   02/01/2021 Surgery   Surgery in [facility]  Date of Service: February 01, 2021 3:13 PM  Preoperative Diagnoses: Endometrial cancer  Postoperative Diagnoses: Endometrial cancer  Procedures: Total abdominal hysterectomy, bilateral salpingo-oophorectomy, left pelvic lymphadenectomy, right para-aortic lymphadenectomy  Surgeon: Maurilio Ship, MD  Findings: Tumor appreciated in the cervical canal. Large, broad, multi fibroid uterus with a calcified fibroid filling the posterior cul de sac, an anterior lower uterine segment fibroid, and bilateral fibroids near the level of the cardinal ligament. No evidence of intraperitoneal disease. Normal appearing bilateral tubes and ovaries. Bilateral hydroureter from uterine compression. Tumor excreted from specimen into the pelvis at time of removal. An approximately 2cm enlarged left pelvic lymph node and a 1cm enlarged right aorta caval lymph node were identified.     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) - Right ovary, negative for tumor - Bilateral fallopian tubes, negative for tumor - Multiple leiomyomata (up to 8.5 cm)   B: Lymph node, aortocaval, excision - Metastatic carcinoma involving 1 lymph node (0.4 cm) (1/1)   C:  Lymph node, left pelvic, excision -Metastatic carcinoma involving 1 lymph node (0.3 cm) (1/1)   Diagnosis Comment    The majority of this tumor is composed of well differentiated (overall grade 2, but with areas consistent with grade 1) endometrioid adenocarcinoma with squamous and mucinous differentiation (approximately 70%); multifocally there is abrupt transition to an undifferentiated malignancy with a high-grade monotonous noncohesive appearance (best seen in slide A2) consistent with undifferentiated carcinoma.  The overall morphologic and immunophenotypic findings are consistent with a dedifferentiated endometrial carcinoma.   Of note, the carcinoma present in the lymph nodes is well differentiated endometrioid carcinoma and the metastatic deposit in the ovary is undifferentiated carcinoma. Carcinoma is present less than 1 mm from uterine corpus serosa (in the lower uterine segment).  The assessment of the paracervical soft tissue margins is challenging due to the disrupted nature of the specimen in this area, however tumor appears present less than 1 mm from paracervical soft tissue.  No definite parametrial invasion is identified.   Immunohistochemistry performed at [facility] (with appropriate controls) demonstrates the following immunoprofile in cells of interest: Well differentiated endometrioid carcinoma: CK AE1/AE3: Positive ER: Positive (>90%, strong staining) P53: Favor wild-type (see comment below)   Dedifferentiated carcinoma: CK AE1/AE3: Very rare cells, predominantly negative ER: Negative P53: Small focus of mutant type staining in a background of wild-type staining (see comment below)   P53 staining comment: In the well differentiated carcinoma component the staining pattern is predominantly wild-type although focally there is slightly increased levels of staining noted.  In the dedifferentiated tumor component, most of the tissue exhibits wild-type pattern staining, however there  is a distinct  small area that exhibits increased/overexpressed mutant pattern p53 staining.    Immunohistochemistry testing for mismatch repair (MMR) proteins is performed on block A2:   MLH1:  Loss of nuclear expression MSH2:  Intact nuclear expression MSH6:  Intact nuclear expression PMS2:  Loss of nuclear expression   Loss of nuclear expression of MLH1 and PMS2 can be a result of sporadic microsatellite instability (via hypermethylation of the MLH1 promoter region) or due to Lynch syndrome / HNPCC. Materials will be submitted to the [facility] [phone]) for MLH promoter hypermethylation testing. These results will be issued in a separate report.      03/05/2021 Initial Diagnosis   Uterine cancer (HCC)   03/05/2021 Cancer Staging   Staging form: Corpus Uteri - Carcinoma and Carcinosarcoma, AJCC 8th Edition - Pathologic stage from 03/05/2021: FIGO Stage IVB (rpT3, pN2a, cM1) - Signed by Lonn Hicks, MD on 11/12/2021 Stage prefix: Recurrence   03/11/2021 Imaging   1. Status post interval hysterectomy and oophorectomy. 2. There is a rim enhancing soft tissue mass centered in the vagina, measuring approximately 4.4 x 3.1 cm. This is new, or significantly enlarged compared to prior examination dated 01/05/2021. Findings are concerning for malignant involvement. Correlate with physical examination. Contrast enhanced MRI may be helpful to further detail soft tissue anatomy of the low pelvis if indicated. 3. There is at least one newly enlarged left iliac lymph node measuring up to 1.4 x 1.3 cm, nonspecific although concerning for nodal metastatic disease. 4. Fluid attenuation lymphocele or seroma adjacent to surgical clip at the level right ovarian vein. 5. Status post interval midline laparotomy. Subcutaneous fluid within the inferior aspect of the wound. The presence or absence of infection is not established by CT.     03/23/2021 - 07/09/2021 Chemotherapy   Patient is on  Treatment Plan : UTERINE Carboplatin  AUC 6 / Paclitaxel  q21d     03/23/2021 Procedure   Placement of a subcutaneous power-injectable port device. Catheter tip at the superior cavoatrial junction   07/30/2021 Imaging   1. Large ill-defined partially enhancing and likely centrally necrotic vaginal and paravaginal mass. Recommend correlation with direct visualization. MRI pelvis without and with contrast may be helpful for further evaluation. 2. No findings suspicious for omental or peritoneal surface disease. No adenopathy.   08/09/2021 - 09/08/2021 Radiation Therapy   Indication for treatment:  Curative       Radiation treatment dates:   08/09/21 through 09/08/21 Site/dose:  IMRT Uterus; Vagina : 41.5 Gy delivered in 23 Fx with 1.80 Gy/Fx (Planned dosage was for 45.00 GY delivered in 25 Fx with 1.80 Gy/Fx, her final 2 treatments (and boost) was canceled secondary to significant radiation reaction)   Beams/energy: 6X   11/11/2021 Imaging   1. 11 mm left upper lobe pulmonary nodule in the apicoposterior segment abuts the posterior pleural margin. In this clinical context the appearance is very concerning for a metastatic lesion although we do not have prior cross-sectional imaging to assess stability. Proximity to the fifth rib and adjacent major fissure might make percutaneous biopsy tricky; PET-CT would also be one way of assessing the metabolic activity/malignant potential of this lesion. This may be a good candidate case for discussion at multidisciplinary cancer conference. 2. Increased adenopathy along the left iliac chain, including an internal iliac node which abuts the left sacral plexus. 3. The lower vaginal and vestibular mass measures about 7.0 by 5.5 by 5.9 cm and has some internal gas which may indicate ulceration along the vestibule/perineum. Poor  definition of tissue planes between this process in the base of the urinary bladder as well as the anus. The mass extends along the left anterior  obturator internus muscle. 4. Other imaging findings of potential clinical significance: Prominent main pulmonary artery, cannot exclude pulmonary arterial hypertension. Aortic Atherosclerosis (ICD10-I70.0). Lower lumbar spondylosis and degenerative disc disease.   11/19/2021 - 03/11/2022 Chemotherapy   Patient is on Treatment Plan : UTERINE Pembrolizumab  (200) q21d     11/19/2021 - 11/24/2023 Chemotherapy   Patient is on Treatment Plan : UTERINE Pembrolizumab  (400) q42d     01/21/2022 Imaging   1. Status post hysterectomy. Generally diminished size of an ulcerated mass involving the vagina when compared to prior examination, difficult to accurately measure due to poor resolution of adjacent soft tissues. 2. Significant interval decrease in size of left pelvic sidewall and iliac lymphadenopathy. 3. Interval enlargement of a pulmonary nodule of the posterior left upper lobe. 4. 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 5. New, diffuse urinary bladder wall thickening and adjacent fat stranding, consistent with nonspecific infectious or inflammatory cystitis, most likely related to local radiation therapy.   04/22/2022 Imaging   1. Resolution of previously noted soft tissue mass associated with the apex of the vagina. 2. Regression of previously noted left upper lobe pulmonary nodule. However, there are multiple new satellite nodules in the adjacent lung parenchyma in the upper left lung, largest of which measures 1.2 x 0.6 cm in the superior segment of the left lower lobe. Close attention on follow-up studies is recommended. 3. Interval development of mildly enlarged bilateral inguinal lymph nodes. These could be reactive, however, the possibility of locoregional nodal metastases is not excluded, and close attention on follow-up imaging is  recommended to ensure regression of these findings. 4. Aortic atherosclerosis, in addition to left anterior descending coronary artery disease. Assessment for potential risk factor modification, dietary therapy or pharmacologic therapy may be warranted, if clinically indicated.  5. Additional incidental findings, as above.   08/29/2022 Imaging   1. Previously noted clustered nodularity in the upper left lung has resolved compared to the prior study. However, today's study demonstrates a new mass-like area of architectural distortion with some internal air bronchograms and cavitation in the posterior aspect of the left upper lobe in the same region. Although this is mass-like in appearance on axial images, this is rather linear in appearance on coronal and sagittal reconstructions. If there has been radiation therapy to this region, this could simply represent an area of evolving postradiation mass-like fibrosis. In the absence of a history of radiation therapy, these findings are presumably of infectious etiology. Neoplasm is not favored, but difficult to entirely exclude. Close attention on follow-up studies is recommended to evaluate for the evolution of these findings. No other suspicious appearing pulmonary nodules or masses are noted on today's examination. 2. No definite signs of metastatic disease noted in the abdomen or pelvis. 3. Multiple prominent borderline enlarged and minimally enlarged bilateral inguinal lymph nodes, slightly less prominent than the prior examination. Continued attention on follow-up studies is recommended. 4. Severe circumferential thickening of the urinary bladder wall without discrete bladder wall mass. This is of uncertain etiology and significance, but correlation with urinalysis is recommended to exclude the possibility of cystitis. Attention on follow-up imaging is recommended to ensure resolution. 5. Aortic atherosclerosis, in addition to left anterior descending  coronary artery disease. Please note that although the presence of coronary artery calcium documents the presence of coronary artery disease, the severity of this disease and any potential stenosis cannot be assessed on this non-gated CT examination. Assessment for potential risk factor modification, dietary therapy or pharmacologic therapy may be warranted, if clinically indicated. 6. Additional incidental findings, as above.     12/23/2022 Imaging   CT ABDOMEN PELVIS W CONTRAST  Result Date: 12/22/2022 CLINICAL DATA:  Cervical cancer; * Tracking Code: BO * EXAM: CT ABDOMEN AND PELVIS WITH CONTRAST TECHNIQUE: Multidetector CT imaging of the abdomen and pelvis was performed using the standard protocol following bolus administration of intravenous contrast. RADIATION DOSE REDUCTION: This exam was performed according to the departmental dose-optimization program which includes automated exposure control, adjustment of the mA and/or kV according to patient size and/or use of iterative reconstruction technique. CONTRAST:  OMNIPAQUE  IOHEXOL  300 MG/ML  SOLN COMPARISON:  CT chest, abdomen and pelvis dated August 29, 2022 FINDINGS: Lower chest: Small hiatal hernia.  No acute abnormality. Hepatobiliary: No focal liver abnormality is seen. No gallstones, gallbladder wall thickening, or biliary dilatation. Pancreas: Relative hypoattenuation of the pancreatic head, unchanged when compared with multiple priors and likely due to focal fatty atrophy. No pancreatic ductal dilatation or surrounding inflammatory changes. Spleen: Normal in size without focal abnormality. Adrenals/Urinary Tract: Bilateral adrenal glands are unremarkable. Hydronephrosis or nephrolithiasis. Bladder is unremarkable. Stomach/Bowel: Stomach is within normal limits. Appendix appears normal. No evidence of bowel wall thickening, distention, or inflammatory changes. Vascular/Lymphatic: Aortic atherosclerosis. No enlarged abdominal or pelvic lymph  nodes. Reproductive: Status post hysterectomy. No adnexal masses. Other: No abdominal wall hernia or abnormality. No abdominopelvic ascites. Musculoskeletal: No acute or significant osseous findings. IMPRESSION: 1. No evidence of metastatic disease in the abdomen or pelvis. 2. Stable prominent bilateral inguinal lymph nodes. 3. Aortic Atherosclerosis (ICD10-I70.0). Electronically Signed   By: Rea Marc M.D.   On: 12/22/2022 15:38      05/26/2023 Imaging   CT CHEST ABDOMEN PELVIS W CONTRAST  Result Date: 05/26/2023 CLINICAL DATA:  History of cervical cancer, follow-up. * Tracking Code: BO * EXAM: CT CHEST, ABDOMEN, AND PELVIS WITH CONTRAST TECHNIQUE: Multidetector CT imaging of the chest, abdomen and pelvis was performed following the standard protocol during bolus administration of intravenous contrast. RADIATION DOSE REDUCTION: This exam was performed according to the departmental dose-optimization program which includes automated exposure control, adjustment of the mA and/or kV according to patient size and/or use of iterative reconstruction technique. CONTRAST:  OMNIPAQUE  IOHEXOL  300 MG/ML  SOLN COMPARISON:  Multiple priors including most recent CT December 21, 2022 and August 29, 2022. FINDINGS: CT CHEST FINDINGS Cardiovascular: Accessed right chest Port-A-Cath with tip near the superior cavoatrial junction. Aortic atherosclerosis. Normal caliber thoracic aorta. No central pulmonary embolus on this nondedicated study. Normal size heart. No significant pericardial effusion/thickening. Mediastinum/Nodes: No suspicious thyroid  nodule. No pathologically enlarged mediastinal, hilar or axillary lymph nodes. Slightly increased soft tissue in the anterior mediastinum which conforms to the underlying aorta measuring 10 x 9 mm on image 22/2, possibly reflecting rebound thymic tissue. Small hiatal hernia. Lungs/Pleura: Similar scarring/atelectasis in the left upper lobe on image 47/4. No new suspicious  pulmonary nodules or masses. No pleural effusion. No pneumothorax. Musculoskeletal: No aggressive lytic or blastic lesion of bone. Degenerative change of the spine. CT ABDOMEN PELVIS FINDINGS Hepatobiliary: No suspicious hepatic lesion. Gallbladder is unremarkable. No biliary ductal dilation. Pancreas: Similar hypodensity in the pancreatic head favored to reflect fatty infiltration.  No pancreatic ductal dilation or evidence of acute inflammation. Spleen: No splenomegaly. Adrenals/Urinary Tract: Bilateral adrenal glands appear normal. No hydronephrosis. Kidneys demonstrate symmetric enhancement. Mild wall thickening of the urinary bladder. Stomach/Bowel: Radiopaque enteric contrast material traverses the cecum. Stomach is unremarkable for minimal distension. No pathologic dilation of small or large bowel. Mild wall thickening with a small amount of fluid in the sigmoid colon. Question of an 8 mm enhancing mural nodule in the sigmoid colon on image 111/2 and image 94/5. Vascular/Lymphatic: Scattered aortic atherosclerosis. Smooth IVC contours. No pathologically enlarged abdominal or pelvic lymph nodes. Prominent inguinal lymph nodes are similar prior for instance a left inguinal lymph node measuring 10 mm in short axis on image 119 with preservation of its fatty hilum, unchanged from prior examination. Reproductive: Uterus is surgically absent without new suspicious enhancing nodularity along the vaginal cuff. No suspicious adnexal mass. Other: No significant abdominopelvic free fluid. No discrete peritoneal or omental nodularity. Postsurgical change in the anterior abdominal wall. Musculoskeletal: No aggressive lytic or blastic lesion of bone. Multilevel degenerative changes spine. Degenerative change of the hips. Chronic osseous changes of the pubic symphysis. IMPRESSION: 1. Status post hysterectomy without evidence of local recurrence. 2. No convincing evidence of metastatic disease in the chest, abdomen or pelvis.  3. Question of an 8 mm enhancing mural nodule in the sigmoid colon, suggest further evaluation with colonoscopy. 4. Mild wall thickening of the urinary bladder, correlate with urinalysis to exclude cystitis. 5. Slightly increased soft tissue in the anterior mediastinum which conforms to the underlying aorta measuring 10 x 9 mm, possibly reflecting rebound thymic tissue. Suggest continued attention on follow-up imaging. 6.  Aortic Atherosclerosis (ICD10-I70.0). These results will be called to the ordering clinician or representative by the Radiologist Assistant, and communication documented in the PACS or [employer]. Electronically Signed   By: Reyes Holder M.D.   On: 05/26/2023 13:30      12/29/2023 Imaging   CT ABDOMEN PELVIS W CONTRAST Result Date: 01/03/2024 CLINICAL DATA:  Follow-up cervical carcinoma. Currently undergoing immunotherapy. * Tracking Code: BO * EXAM: CT ABDOMEN AND PELVIS WITH CONTRAST TECHNIQUE: Multidetector CT imaging of the abdomen and pelvis was performed using the standard protocol following bolus administration of intravenous contrast. RADIATION DOSE REDUCTION: This exam was performed according to the departmental dose-optimization program which includes automated exposure control, adjustment of the mA and/or kV according to patient size and/or use of iterative reconstruction technique. CONTRAST:  OMNIPAQUE  IOHEXOL  300 MG/ML  SOLN COMPARISON:  05/26/2023 FINDINGS: Lower Chest: New heterogeneous multifocal airspace disease is seen throughout the visualized portions of both lung bases which are new and suspicious for drug reaction or infection. No pleural effusion. Hepatobiliary: No suspicious hepatic masses identified. Gallbladder is unremarkable. No evidence of biliary ductal dilatation. Pancreas:  No mass or inflammatory changes. Spleen: Within normal limits in size and appearance. Adrenals/Urinary Tract: No suspicious masses identified. No evidence of ureteral calculi or  hydronephrosis. Unremarkable unopacified urinary bladder. Stomach/Bowel: No evidence of obstruction, inflammatory process or abnormal fluid collections. Vascular/Lymphatic: No pathologically enlarged lymph nodes. No acute vascular findings. Reproductive: Prior hysterectomy noted. No mass identified within the hysterectomy bed. Adnexal regions are unremarkable in appearance. Other:  None. Musculoskeletal:  No suspicious bone lesions identified. IMPRESSION: No evidence of recurrent or metastatic carcinoma within the abdomen or pelvis. New heterogeneous airspace disease throughout the visualized portions of both lung bases, suspicious for drug reaction or infection. Electronically Signed   By: Norleen DELENA Kil M.D.   On:  01/03/2024 09:32      05/03/2024 Imaging   CT CHEST ABDOMEN PELVIS W CONTRAST Result Date: 05/06/2024 EXAM: CT CHEST, ABDOMEN AND PELVIS WITH CONTRAST 05/03/2024 10:59:31 AM TECHNIQUE: CT of the chest, abdomen and pelvis was performed with the administration of intravenous contrast, (iohexol  (OMNIPAQUE ) 300 MG/ML solution). Multiplanar reformatted images are provided for review. Automated exposure control, iterative reconstruction, and/or weight based adjustment of the mA/kV was utilized to reduce the radiation dose to as low as reasonably achievable. COMPARISON: Comparison is made to prior examination of December 29, 2023, and May 26, 2023. CLINICAL HISTORY: Uterine cancer, assess for recurrence. Recent pneumonitis from treatment. *tracking code: Bo* FINDINGS: CHEST: Mild coronary artery calcification. Right internal jugular se- chest port tip seen within the superior right atrium. Global cardiac size within normal limits. No pericardial effusion. Central pulmonary arteries are enlarged in keeping with changes of pulmonary arterial hypertension, similar to prior examination. Mild atherosclerotic calcification within the thoracic aorta. No aortic aneurysm. MEDIASTINUM AND LYMPH NODES:  Visualized thyroid  is unremarkable. No pathologic thoracic adenopathy. The esophagus is unremarkable. LUNGS AND PLEURA: There are bilateral asymmetric ground glass and consolidative infiltrates which appear more organized than on prior examination and are keeping with multifocal organizing pneumonia, possibly the sequelae of drug therapy changes related to collagen vascular disease could appear similarly in the appropriate clinical setting. No pneumothorax or pleural effusion. No central obstructing lesion. ABDOMEN AND PELVIS: LIVER: The liver is unremarkable. GALLBLADDER AND BILE DUCTS: Gallbladder is unremarkable. No biliary ductal dilatation. PANCREAS: Stable asymmetric fatty infiltration within the head and uncinate process of the pancreas. The pancreas is otherwise unremarkable. SPLEEN: No acute abnormality. ADRENAL GLANDS: No acute abnormality. KIDNEYS, URETERS AND BLADDER: No stones in the kidneys or ureters. No hydronephrosis. No perinephric or periureteral stranding. Urinary bladder is unremarkable. GI AND BOWEL: Appendix normal. The stomach, small bowel, and large bowel are otherwise unremarkable. REPRODUCTIVE ORGANS: Status post hysterectomy. No adnexal masses were seen. PERITONEUM AND RETROPERITONEUM: No ascites. No free air. VASCULATURE: Mild aortoiliac atherosclerotic calcification. No aortic aneurysm. Aorta is normal in caliber. ABDOMINAL AND PELVIS LYMPH NODES: No lymphadenopathy. BONES AND SOFT TISSUES: Osseous structures are age appropriate. No acute bone abnormality. No lytic or blastic bone lesion. No focal soft tissue abnormality. IMPRESSION: 1. No evidence of recurrent or residual disease within the chest, abdomen, and pelvis. 2. Bilateral asymmetric ground glass and consolidative infiltrates, more organized than prior, consistent with multifocal organizing pneumonia; consider drug therapy-related changes or collagen vascular disease in the appropriate clinical setting. 3. Enlarged central  pulmonary arteries consistent with pulmonary arterial hypertension, similar to prior examination. Electronically signed by: Dorethia Molt MD 05/06/2024 03:56 AM EDT RP Workstation: [id number]       I discussed the assessment and treatment plan with the patient. The patient was provided an opportunity to ask questions and all were answered. The patient agreed with the plan and demonstrated an understanding of the instructions. The patient was advised to call back or seek an in-person evaluation if the symptoms worsen or if the condition fails to improve as anticipated.    I spent 20 minutes for the appointment reviewing test results, discuss management and coordination of care.  Almarie Bedford, MD 05/17/2024 11:49 AM

## 2024-05-17 NOTE — Assessment & Plan Note (Signed)
 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  Treatment was discontinued after May 2025 treatment due to pneumonitis from checkpoint inhibitors  CT imaging from October 2025 persistent pulmonary infiltrates but she is not symptomatic  The plan would be to continue close monitoring with surveillance imaging study, next imaging study would be in January 2026 She has no signs of cancer relapse

## 2024-05-17 NOTE — Telephone Encounter (Signed)
 Called and given appt times on 12/19. She is aware of appt date/times.

## 2024-06-27 ENCOUNTER — Other Ambulatory Visit: Payer: Self-pay | Admitting: Hematology and Oncology

## 2024-06-28 ENCOUNTER — Telehealth: Payer: Self-pay

## 2024-06-28 ENCOUNTER — Encounter: Payer: Self-pay | Admitting: Hematology and Oncology

## 2024-06-28 ENCOUNTER — Other Ambulatory Visit: Payer: Self-pay | Admitting: Hematology and Oncology

## 2024-06-28 ENCOUNTER — Inpatient Hospital Stay: Attending: Gynecologic Oncology | Admitting: Hematology and Oncology

## 2024-06-28 ENCOUNTER — Inpatient Hospital Stay

## 2024-06-28 ENCOUNTER — Inpatient Hospital Stay: Attending: Gynecologic Oncology

## 2024-06-28 VITALS — BP 142/74 | HR 66 | Temp 97.4°F | Resp 18 | Ht 63.0 in | Wt 216.4 lb

## 2024-06-28 DIAGNOSIS — C55 Malignant neoplasm of uterus, part unspecified: Secondary | ICD-10-CM

## 2024-06-28 DIAGNOSIS — D509 Iron deficiency anemia, unspecified: Secondary | ICD-10-CM | POA: Diagnosis not present

## 2024-06-28 DIAGNOSIS — E039 Hypothyroidism, unspecified: Secondary | ICD-10-CM

## 2024-06-28 DIAGNOSIS — D539 Nutritional anemia, unspecified: Secondary | ICD-10-CM | POA: Diagnosis not present

## 2024-06-28 DIAGNOSIS — Z8542 Personal history of malignant neoplasm of other parts of uterus: Secondary | ICD-10-CM | POA: Insufficient documentation

## 2024-06-28 DIAGNOSIS — E538 Deficiency of other specified B group vitamins: Secondary | ICD-10-CM | POA: Diagnosis not present

## 2024-06-28 DIAGNOSIS — Z79899 Other long term (current) drug therapy: Secondary | ICD-10-CM | POA: Insufficient documentation

## 2024-06-28 LAB — COMPREHENSIVE METABOLIC PANEL WITH GFR
ALT: 9 U/L (ref 0–44)
AST: 13 U/L — ABNORMAL LOW (ref 15–41)
Albumin: 4 g/dL (ref 3.5–5.0)
Alkaline Phosphatase: 103 U/L (ref 38–126)
Anion gap: 9 (ref 5–15)
BUN: 13 mg/dL (ref 8–23)
CO2: 26 mmol/L (ref 22–32)
Calcium: 9.3 mg/dL (ref 8.9–10.3)
Chloride: 107 mmol/L (ref 98–111)
Creatinine, Ser: 0.86 mg/dL (ref 0.44–1.00)
GFR, Estimated: >60 mL/min
Glucose, Bld: 94 mg/dL (ref 70–99)
Potassium: 4.1 mmol/L (ref 3.5–5.1)
Sodium: 141 mmol/L (ref 135–145)
Total Bilirubin: 0.3 mg/dL (ref 0.0–1.2)
Total Protein: 7 g/dL (ref 6.5–8.1)

## 2024-06-28 LAB — CBC WITH DIFFERENTIAL/PLATELET
Abs Immature Granulocytes: 0.01 K/uL (ref 0.00–0.07)
Basophils Absolute: 0.1 K/uL (ref 0.0–0.1)
Basophils Relative: 1 %
Eosinophils Absolute: 0.2 K/uL (ref 0.0–0.5)
Eosinophils Relative: 5 %
HCT: 32.6 % — ABNORMAL LOW (ref 36.0–46.0)
Hemoglobin: 10 g/dL — ABNORMAL LOW (ref 12.0–15.0)
Immature Granulocytes: 0 %
Lymphocytes Relative: 29 %
Lymphs Abs: 1.1 K/uL (ref 0.7–4.0)
MCH: 21.3 pg — ABNORMAL LOW (ref 26.0–34.0)
MCHC: 30.7 g/dL (ref 30.0–36.0)
MCV: 69.4 fL — ABNORMAL LOW (ref 80.0–100.0)
Monocytes Absolute: 0.3 K/uL (ref 0.1–1.0)
Monocytes Relative: 9 %
Neutro Abs: 2 K/uL (ref 1.7–7.7)
Neutrophils Relative %: 56 %
Platelets: 267 K/uL (ref 150–400)
RBC: 4.7 MIL/uL (ref 3.87–5.11)
RDW: 19.1 % — ABNORMAL HIGH (ref 11.5–15.5)
WBC: 3.7 K/uL — ABNORMAL LOW (ref 4.0–10.5)
nRBC: 0 % (ref 0.0–0.2)

## 2024-06-28 LAB — TSH: TSH: 6.04 u[IU]/mL — ABNORMAL HIGH (ref 0.350–4.500)

## 2024-06-28 LAB — VITAMIN B12: Vitamin B-12: 373 pg/mL (ref 180–914)

## 2024-06-28 MED ORDER — CYANOCOBALAMIN 1000 MCG/ML IJ SOLN
1000.0000 ug | Freq: Once | INTRAMUSCULAR | Status: AC
  Administered 2024-06-28: 1000 ug via INTRAMUSCULAR
  Filled 2024-06-28: qty 1

## 2024-06-28 NOTE — Assessment & Plan Note (Addendum)
 Recent vitamin B12 level was adequate She will receive vitamin B12 injection again today and with every visit

## 2024-06-28 NOTE — Assessment & Plan Note (Addendum)
 She has history of iron  deficiency anemia and remained anemic Plan to recheck iron  studies again next visit

## 2024-06-28 NOTE — Assessment & Plan Note (Addendum)
 She is not consistently taking her thyroid  medicine Repeat TSH is markedly improved She will continue current thyroid  supplement and we will recheck every visit

## 2024-06-28 NOTE — Telephone Encounter (Signed)
-----   Message from Candice Bedford, MD sent at 06/28/2024 11:17 AM EST ----- Her TSH is almost back to normal No changes to her med I sent refill to her pharmacy

## 2024-06-28 NOTE — Telephone Encounter (Signed)
Called and given below message. She verbalized understanding and appreciated the call.

## 2024-06-28 NOTE — Progress Notes (Signed)
 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  Treatment was discontinued after May 2025 treatment due to pneumonitis from checkpoint inhibitors  CT imaging from October 2025 persistent pulmonary infiltrates but she is not symptomatic  The plan would be to continue close monitoring with surveillance imaging study, next imaging study would be in February 2026 She has no signs of cancer relapse Vitamin B12 deficiency Recent vitamin B12 level was adequate She will receive vitamin B12 injection again today and with every visit Deficiency anemia She has history of iron  deficiency anemia and remained anemic Plan to recheck iron  studies again next visit Hypothyroidism, unspecified type She is not consistently taking her thyroid  medicine Repeat TSH is markedly improved She will continue current thyroid  supplement and we will recheck every visit  Orders Placed This Encounter  Procedures   CT CHEST ABDOMEN PELVIS W CONTRAST    Standing Status:   Future    Expected Date:   08/23/2024    Expiration Date:   06/28/2025    If indicated for the ordered procedure, I authorize the administration of contrast media per Radiology protocol:   Yes    Does the patient have a contrast media/X-ray dye  allergy?:   No    Preferred imaging location?:   [facility]    If indicated for the ordered procedure, I authorize the administration of oral contrast media per Radiology protocol:   No    Reason for no oral contrast::   no need oral contrast   Iron  and Iron  Binding Capacity ([facility] only)    Standing Status:   Future    Expiration Date:   06/28/2025   Ferritin    Standing Status:   Future    Expiration Date:   06/28/2025     Almarie Bedford, MD  INTERVAL HISTORY: she returns for surveillance follow-up for history of recurrent uterine cancer and chronic vitamin B12 deficiency requiring B12 supplement with injection She is doing well She is compliant taking her medications as directed She had recent knee surgery and it appears to be doing well  PHYSICAL EXAMINATION: ECOG PERFORMANCE STATUS: 1 - Symptomatic but completely ambulatory  Vitals:   06/28/24 0934  BP: (!) 142/74  Pulse: 66  Resp: 18  Temp: (!) 97.4 F (36.3 C)  SpO2: 100%   Filed Weights   06/28/24 0934  Weight: 216 lb 6.4 oz (98.2 kg)    Relevant data reviewed during this visit included CBC, CMP, TSH, vitamin B12

## 2024-06-28 NOTE — Assessment & Plan Note (Addendum)
 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  Treatment was discontinued after May 2025 treatment due to pneumonitis from checkpoint inhibitors  CT imaging from October 2025 persistent pulmonary infiltrates but she is not symptomatic  The plan would be to continue close monitoring with surveillance imaging study, next imaging study would be in February 2026 She has no signs of cancer relapse

## 2024-07-22 ENCOUNTER — Other Ambulatory Visit (HOSPITAL_COMMUNITY): Payer: Self-pay

## 2024-08-23 ENCOUNTER — Ambulatory Visit (HOSPITAL_COMMUNITY)

## 2024-08-23 ENCOUNTER — Inpatient Hospital Stay: Attending: Gynecologic Oncology

## 2024-08-30 ENCOUNTER — Inpatient Hospital Stay: Admitting: Hematology and Oncology

## 2024-08-30 ENCOUNTER — Inpatient Hospital Stay
